# Patient Record
Sex: Female | Born: 1985 | Race: White | Hispanic: No | Smoking: Current every day smoker
Health system: Southern US, Community
[De-identification: ages and names within clinical notes are randomized; demographics above are authoritative.]

---

## 2021-08-26 ENCOUNTER — Other Ambulatory Visit: Payer: Self-pay

## 2021-08-26 ENCOUNTER — Inpatient Hospital Stay (HOSPITAL_COMMUNITY): Payer: Medicaid Other

## 2021-08-26 ENCOUNTER — Inpatient Hospital Stay (HOSPITAL_COMMUNITY)
Admission: AD | Admit: 2021-08-26 | Discharge: 2021-10-29 | DRG: 853 | Disposition: A | Discharge status: Home / Self Care | Payer: Medicaid Other | Source: Other Acute Inpatient Hospital | Attending: Internal Medicine | Admitting: Internal Medicine

## 2021-08-26 ENCOUNTER — Encounter (HOSPITAL_COMMUNITY): Payer: Self-pay | Admitting: Internal Medicine

## 2021-08-26 DIAGNOSIS — Z79899 Other long term (current) drug therapy: Secondary | ICD-10-CM | POA: Diagnosis not present

## 2021-08-26 DIAGNOSIS — J90 Pleural effusion, not elsewhere classified: Secondary | ICD-10-CM | POA: Diagnosis present

## 2021-08-26 DIAGNOSIS — F419 Anxiety disorder, unspecified: Secondary | ICD-10-CM | POA: Diagnosis present

## 2021-08-26 DIAGNOSIS — G8929 Other chronic pain: Secondary | ICD-10-CM | POA: Diagnosis present

## 2021-08-26 DIAGNOSIS — M84411A Pathological fracture, right shoulder, initial encounter for fracture: Secondary | ICD-10-CM | POA: Diagnosis present

## 2021-08-26 DIAGNOSIS — M4628 Osteomyelitis of vertebra, sacral and sacrococcygeal region: Secondary | ICD-10-CM | POA: Diagnosis present

## 2021-08-26 DIAGNOSIS — M4622 Osteomyelitis of vertebra, cervical region: Secondary | ICD-10-CM | POA: Diagnosis present

## 2021-08-26 DIAGNOSIS — M4624 Osteomyelitis of vertebra, thoracic region: Secondary | ICD-10-CM | POA: Diagnosis present

## 2021-08-26 DIAGNOSIS — M4627 Osteomyelitis of vertebra, lumbosacral region: Secondary | ICD-10-CM | POA: Diagnosis present

## 2021-08-26 DIAGNOSIS — M869 Osteomyelitis, unspecified: Secondary | ICD-10-CM | POA: Diagnosis not present

## 2021-08-26 DIAGNOSIS — K6812 Psoas muscle abscess: Secondary | ICD-10-CM | POA: Diagnosis present

## 2021-08-26 DIAGNOSIS — M86111 Other acute osteomyelitis, right shoulder: Secondary | ICD-10-CM | POA: Diagnosis present

## 2021-08-26 DIAGNOSIS — M4656 Other infective spondylopathies, lumbar region: Secondary | ICD-10-CM | POA: Diagnosis not present

## 2021-08-26 DIAGNOSIS — D649 Anemia, unspecified: Secondary | ICD-10-CM | POA: Diagnosis present

## 2021-08-26 DIAGNOSIS — R7881 Bacteremia: Principal | ICD-10-CM | POA: Diagnosis present

## 2021-08-26 DIAGNOSIS — F17210 Nicotine dependence, cigarettes, uncomplicated: Secondary | ICD-10-CM | POA: Diagnosis present

## 2021-08-26 DIAGNOSIS — K255 Chronic or unspecified gastric ulcer with perforation: Secondary | ICD-10-CM | POA: Diagnosis present

## 2021-08-26 DIAGNOSIS — F1910 Other psychoactive substance abuse, uncomplicated: Secondary | ICD-10-CM | POA: Diagnosis present

## 2021-08-26 DIAGNOSIS — B9562 Methicillin resistant Staphylococcus aureus infection as the cause of diseases classified elsewhere: Secondary | ICD-10-CM

## 2021-08-26 DIAGNOSIS — M4626 Osteomyelitis of vertebra, lumbar region: Secondary | ICD-10-CM | POA: Diagnosis present

## 2021-08-26 DIAGNOSIS — Z8249 Family history of ischemic heart disease and other diseases of the circulatory system: Secondary | ICD-10-CM | POA: Diagnosis not present

## 2021-08-26 DIAGNOSIS — E43 Unspecified severe protein-calorie malnutrition: Secondary | ICD-10-CM | POA: Diagnosis present

## 2021-08-26 DIAGNOSIS — K631 Perforation of intestine (nontraumatic): Secondary | ICD-10-CM | POA: Diagnosis not present

## 2021-08-26 DIAGNOSIS — F418 Other specified anxiety disorders: Secondary | ICD-10-CM | POA: Diagnosis present

## 2021-08-26 DIAGNOSIS — A4102 Sepsis due to Methicillin resistant Staphylococcus aureus: Principal | ICD-10-CM | POA: Diagnosis present

## 2021-08-26 DIAGNOSIS — M4649 Discitis, unspecified, multiple sites in spine: Secondary | ICD-10-CM | POA: Diagnosis present

## 2021-08-26 DIAGNOSIS — M009 Pyogenic arthritis, unspecified: Secondary | ICD-10-CM | POA: Diagnosis present

## 2021-08-26 DIAGNOSIS — Z90710 Acquired absence of both cervix and uterus: Secondary | ICD-10-CM

## 2021-08-26 DIAGNOSIS — F32A Depression, unspecified: Secondary | ICD-10-CM | POA: Diagnosis present

## 2021-08-26 DIAGNOSIS — J869 Pyothorax without fistula: Secondary | ICD-10-CM | POA: Diagnosis not present

## 2021-08-26 DIAGNOSIS — Z9151 Personal history of suicidal behavior: Secondary | ICD-10-CM

## 2021-08-26 DIAGNOSIS — K668 Other specified disorders of peritoneum: Secondary | ICD-10-CM | POA: Diagnosis not present

## 2021-08-26 DIAGNOSIS — T50902A Poisoning by unspecified drugs, medicaments and biological substances, intentional self-harm, initial encounter: Secondary | ICD-10-CM

## 2021-08-26 DIAGNOSIS — M19011 Primary osteoarthritis, right shoulder: Secondary | ICD-10-CM | POA: Diagnosis present

## 2021-08-26 DIAGNOSIS — G061 Intraspinal abscess and granuloma: Secondary | ICD-10-CM | POA: Diagnosis present

## 2021-08-26 DIAGNOSIS — R7401 Elevation of levels of liver transaminase levels: Secondary | ICD-10-CM | POA: Diagnosis not present

## 2021-08-26 DIAGNOSIS — R52 Pain, unspecified: Secondary | ICD-10-CM

## 2021-08-26 DIAGNOSIS — L02214 Cutaneous abscess of groin: Secondary | ICD-10-CM | POA: Diagnosis not present

## 2021-08-26 DIAGNOSIS — Z635 Disruption of family by separation and divorce: Secondary | ICD-10-CM

## 2021-08-26 DIAGNOSIS — M47817 Spondylosis without myelopathy or radiculopathy, lumbosacral region: Secondary | ICD-10-CM | POA: Diagnosis present

## 2021-08-26 DIAGNOSIS — E663 Overweight: Secondary | ICD-10-CM | POA: Diagnosis present

## 2021-08-26 HISTORY — DX: Other psychoactive substance abuse, uncomplicated: F19.10

## 2021-08-26 HISTORY — DX: Other specified anxiety disorders: F41.8

## 2021-08-26 HISTORY — DX: Poisoning by unspecified drugs, medicaments and biological substances, intentional self-harm, initial encounter: T50.902A

## 2021-08-26 LAB — CBC
HCT: 29.1 % — ABNORMAL LOW (ref 36.0–46.0)
Hemoglobin: 9.4 g/dL — ABNORMAL LOW (ref 12.0–15.0)
MCH: 31 pg (ref 26.0–34.0)
MCHC: 32.3 g/dL (ref 30.0–36.0)
MCV: 96 fL (ref 80.0–100.0)
Platelets: 642 10*3/uL — ABNORMAL HIGH (ref 150–400)
RBC: 3.03 MIL/uL — ABNORMAL LOW (ref 3.87–5.11)
RDW: 15.4 % (ref 11.5–15.5)
WBC: 13.8 10*3/uL — ABNORMAL HIGH (ref 4.0–10.5)
nRBC: 0 % (ref 0.0–0.2)

## 2021-08-26 LAB — COMPREHENSIVE METABOLIC PANEL
ALT: 107 U/L — ABNORMAL HIGH (ref 0–44)
AST: 76 U/L — ABNORMAL HIGH (ref 15–41)
Albumin: 1.9 g/dL — ABNORMAL LOW (ref 3.5–5.0)
Alkaline Phosphatase: 104 U/L (ref 38–126)
Anion gap: 5 (ref 5–15)
BUN: 8 mg/dL (ref 6–20)
CO2: 26 mmol/L (ref 22–32)
Calcium: 8 mg/dL — ABNORMAL LOW (ref 8.9–10.3)
Chloride: 103 mmol/L (ref 98–111)
Creatinine, Ser: 0.34 mg/dL — ABNORMAL LOW (ref 0.44–1.00)
GFR, Estimated: >60 mL/min (ref >60)
Glucose, Bld: 95 mg/dL (ref 70–99)
Potassium: 4.1 mmol/L (ref 3.5–5.1)
Sodium: 134 mmol/L — ABNORMAL LOW (ref 135–145)
Total Bilirubin: 0.6 mg/dL (ref 0.3–1.2)
Total Protein: 6.5 g/dL (ref 6.5–8.1)

## 2021-08-26 MED ORDER — HYDROMORPHONE HCL 1 MG/ML IJ SOLN
1.0000 mg | INTRAMUSCULAR | Status: AC | PRN
  Administered 2021-08-26 – 2021-08-27 (×4): 1 mg via INTRAVENOUS
  Filled 2021-08-26 (×4): qty 1

## 2021-08-26 MED ORDER — HYDROXYZINE HCL 25 MG PO TABS
50.0000 mg | ORAL_TABLET | Freq: Four times a day (QID) | ORAL | Status: DC | PRN
  Administered 2021-08-27 – 2021-08-29 (×3): 50 mg via ORAL
  Filled 2021-08-26 (×4): qty 2

## 2021-08-26 MED ORDER — NICOTINE 21 MG/24HR TD PT24
21.0000 mg | MEDICATED_PATCH | Freq: Every day | TRANSDERMAL | Status: DC
  Administered 2021-08-26 – 2021-10-28 (×63): 21 mg via TRANSDERMAL
  Filled 2021-08-26 (×66): qty 1

## 2021-08-26 MED ORDER — TRAMADOL HCL 50 MG PO TABS
50.0000 mg | ORAL_TABLET | Freq: Four times a day (QID) | ORAL | Status: DC | PRN
  Administered 2021-08-26 – 2021-08-27 (×3): 50 mg via ORAL
  Filled 2021-08-26 (×4): qty 1

## 2021-08-26 MED ORDER — SODIUM BICARBONATE 650 MG PO TABS
650.0000 mg | ORAL_TABLET | Freq: Every day | ORAL | Status: DC
Start: 2021-08-27 — End: 2021-08-30
  Administered 2021-08-27 – 2021-08-29 (×2): 650 mg via ORAL
  Filled 2021-08-26 (×2): qty 1

## 2021-08-26 MED ORDER — ENOXAPARIN SODIUM 40 MG/0.4ML IJ SOSY
40.0000 mg | PREFILLED_SYRINGE | INTRAMUSCULAR | Status: DC
  Administered 2021-08-26: 40 mg via SUBCUTANEOUS
  Filled 2021-08-26: qty 0.4

## 2021-08-26 MED ORDER — FLUOXETINE HCL 20 MG PO CAPS
40.0000 mg | ORAL_CAPSULE | Freq: Every day | ORAL | Status: DC
Start: 2021-08-27 — End: 2021-10-29
  Administered 2021-08-27 – 2021-10-29 (×57): 40 mg via ORAL
  Filled 2021-08-26 (×62): qty 2

## 2021-08-26 MED ORDER — SODIUM CHLORIDE 0.9 % IV SOLN
8.0000 mg/kg | Freq: Every day | INTRAVENOUS | Status: DC
  Administered 2021-08-26 – 2021-08-28 (×3): 700 mg via INTRAVENOUS
  Filled 2021-08-26 (×5): qty 14

## 2021-08-26 MED ORDER — ONDANSETRON HCL 4 MG/2ML IJ SOLN
4.0000 mg | Freq: Four times a day (QID) | INTRAMUSCULAR | Status: DC | PRN
  Administered 2021-08-28 – 2021-08-30 (×3): 4 mg via INTRAVENOUS
  Filled 2021-08-26 (×3): qty 2

## 2021-08-26 MED ORDER — LACTATED RINGERS IV SOLN: INTRAVENOUS | Status: DC

## 2021-08-26 MED ORDER — ACETAMINOPHEN 325 MG PO TABS
650.0000 mg | ORAL_TABLET | Freq: Four times a day (QID) | ORAL | Status: DC | PRN
  Administered 2021-08-27 – 2021-08-28 (×2): 650 mg via ORAL
  Filled 2021-08-26 (×2): qty 2

## 2021-08-26 MED ORDER — ACETAMINOPHEN 650 MG RE SUPP: 650.0000 mg | Freq: Four times a day (QID) | RECTAL | Status: DC | PRN

## 2021-08-26 MED ORDER — SODIUM CHLORIDE 0.9 % IV SOLN
2.0000 g | INTRAVENOUS | Status: DC
  Administered 2021-08-26 – 2021-08-27 (×2): 2 g via INTRAVENOUS
  Filled 2021-08-26 (×3): qty 20

## 2021-08-26 MED ORDER — ONDANSETRON HCL 4 MG PO TABS: 4.0000 mg | ORAL_TABLET | Freq: Four times a day (QID) | ORAL | Status: DC | PRN

## 2021-08-26 MED ORDER — LIDOCAINE 5 % EX PTCH
1.0000 | MEDICATED_PATCH | CUTANEOUS | Status: DC
  Administered 2021-08-26 – 2021-10-28 (×44): 1 via TRANSDERMAL
  Filled 2021-08-26 (×59): qty 1

## 2021-08-26 NOTE — Progress Notes (Signed)
Pharmacy Antibiotic Note ? [name] is a 36 y.o. female admitted on 08/26/2021 with bacteremia.  Pharmacy has been consulted for daptomycin dosing. Pt transferred from [facility] and per Dr. Olevia Bowens who has been reading thru all the notes from that hospital stay, patient was being seen by ID there and was on Rocephin and daptomycin for MRSA bacteremia in setting of right clavicle area abscess and Hx of IVDA. ? ?Plan: ?Per Dr. Olevia Bowens, patient received last daptomycin and rocephin yesterday AM at [facility] hospital ?Start Daptomycin 10mg /kg IV q24 ?Check CPK tomorrow 3/24 with AM labs ?Rocephin 2g IV q24 per Md ? ?Height: 5\' 7"  (170.2 cm) ?Weight: 87.2 kg (192 lb 3.9 oz) ?IBW/kg (Calculated) : 61.6 ? ?Temp (24hrs), Avg:98.4 ?F (36.9 ?C), Min:98.4 ?F (36.9 ?C), Max:98.4 ?F (36.9 ?C) ? ?Recent Labs  ?Lab 08/26/21 ?1714  ?WBC 13.8*  ?CREATININE 0.34*  ?  ?Estimated Creatinine Clearance: 110.2 mL/min (A) (by C-G formula based on SCr of 0.34 mg/dL (L)).   ? ?Allergies  ?Allergen Reactions  ? Amoxicillin Swelling  ?  Throat closes  ? ? ? ?Thank you for allowing pharmacy to be a part of this patientKimberlys care. ? ?Kara Mead ?08/26/2021 6:15 PM ? ?

## 2021-08-26 NOTE — Consult Note (Signed)
Reason for Consult: Right shoulder pain ?Referring Physician: Dr. Robb Matar ? ?Kimberly Booker is an 36 y.o. female.  ?HPI: Patient presents on transfer from [facility] for management of MRSA sepsis.  All of her old notes are reviewed.  I talked with the patient as well.  In summary she had a fall 8 months ago where she sustained a displaced clavicle fracture.  She was recommended to follow-up with orthopedics but she never did.  By her accounts this seems to have healed with little to no functional difficulty until 7 months after her injury.  She did report posttraumatic deformity of the clavicle but was functional.  At this time she was also using IV drugs per chart review.  She developed acute pain and swelling in the right clavicle approximately 1 month ago.  This led to her attempt to decompress what appeared to be an infection in the right clavicle region.  She was subsequently admitted to the hospital for sepsis and kidney injury.  She underwent a procedure in the emergency department for decompression of the abscess.  CT scanning demonstrated large 8 cm mass in the region of the midshaft clavicle fracture with bony destruction and new bone formation.  She had a transesophageal echo which was negative for endocarditis.  She was transferred here for further management; however, orthopedics was not consulted about this transfer prior to her arrival.  Currently her white blood count is decreasing from a high of over 40,000.  She does not feel particularly great today and she does report significant back pain and weakness and swelling in the bilateral lower extremities.. ? ?Past Medical History:  ?Diagnosis Date  ? Anxiety with depression 08/26/2021  ? Polysubstance abuse (HCC) 08/26/2021  ? Suicide attempt by drug overdose (HCC) 08/26/2021  ? ? ?History reviewed. No pertinent surgical history. ? ?Family History  ?Problem Relation Age of Onset  ? Hypertension Other   ? ? ?Social History:  reports that she has  been smoking cigarettes. She has never used smokeless tobacco. She reports that she does not currently use alcohol. She reports current drug use. Drugs: Methamphetamines and Heroin. ? ?Allergies:  ?Allergies  ?Allergen Reactions  ? Amoxicillin Swelling  ?  Throat closes  ? ? ?Medications: I have reviewed the patient's current medications. ? ?Results for orders placed or performed during the hospital encounter of 08/26/21 (from the past 48 hour(s))  ?CBC     Status: Abnormal  ? Collection Time: 08/26/21  5:14 PM  ?Result Value Ref Range  ? WBC 13.8 (H) 4.0 - 10.5 K/uL  ? RBC 3.03 (L) 3.87 - 5.11 MIL/uL  ? Hemoglobin 9.4 (L) 12.0 - 15.0 g/dL  ? HCT 29.1 (L) 36.0 - 46.0 %  ? MCV 96.0 80.0 - 100.0 fL  ? MCH 31.0 26.0 - 34.0 pg  ? MCHC 32.3 30.0 - 36.0 g/dL  ? RDW 15.4 11.5 - 15.5 %  ? Platelets 642 (H) 150 - 400 K/uL  ? nRBC 0.0 0.0 - 0.2 %  ?  Comment: Performed at [facility], [address], [place], [place] [address]  ?Comprehensive metabolic panel     Status: Abnormal  ? Collection Time: 08/26/21  5:14 PM  ?Result Value Ref Range  ? Sodium 134 (L) 135 - 145 mmol/L  ? Potassium 4.1 3.5 - 5.1 mmol/L  ? Chloride 103 98 - 111 mmol/L  ? CO2 26 22 - 32 mmol/L  ? Glucose, Bld 95 70 - 99 mg/dL  ?  Comment: Glucose reference range applies only to samples taken after fasting for at least 8 hours.  ? BUN 8 6 - 20 mg/dL  ? Creatinine, Ser 0.34 (L) 0.44 - 1.00 mg/dL  ? Calcium 8.0 (L) 8.9 - 10.3 mg/dL  ? Total Protein 6.5 6.5 - 8.1 g/dL  ? Albumin 1.9 (L) 3.5 - 5.0 g/dL  ? AST 76 (H) 15 - 41 U/L  ? ALT 107 (H) 0 - 44 U/L  ? Alkaline Phosphatase 104 38 - 126 U/L  ? Total Bilirubin 0.6 0.3 - 1.2 mg/dL  ? GFR, Estimated >60 >60 mL/min  ?  Comment: (NOTE) ?Calculated using the CKD-EPI Creatinine Equation (2021) ?  ? Anion gap 5 5 - 15  ?  Comment: Performed at [facility], [address] [place], [place] [address]  ? ? ?DG Shoulder Right ? ?Result Date: 08/26/2021 ?CLINICAL DATA:  History of  prior clavicular fracture and right shoulder abscess, initial encounter EXAM: RIGHT SHOULDER - 2+ VIEW COMPARISON:  None. FINDINGS: Midshaft right clavicular fracture is noted with downward displacement of the distal fracture fragment. Increased density is noted over the shoulder consistent with soft tissue dressing. Some air is noted in the supraclavicular region consistent with the known history of abscess. Humeral head is well seated. Generalized increased density is noted in the visualized lung fields likely related to poor inspiratory effort. IMPRESSION: Right clavicular fracture with soft tissue changes consistent with the given clinical history of abscess. Electronically Signed   By: Alcide Clever M.D.   On: 08/26/2021 20:30   ? ?Review of Systems  ?Constitutional:  Positive for appetite change and fatigue.  ?Musculoskeletal:  Positive for arthralgias, back pain and myalgias.  ?Blood pressure 123/78, pulse 99, temperature 99.1 ?F (37.3 ?C), temperature source Oral, resp. rate 18, height 5\' 7"  (1.702 m), weight 87.2 kg, SpO2 100 %. ?Physical Exam ?Vitals reviewed.  ?HENT:  ?   Head: Normocephalic.  ?   Nose: Nose normal.  ?   Mouth/Throat:  ?   Mouth: Mucous membranes are moist.  ?Eyes:  ?   Pupils: Pupils are equal, round, and reactive to light.  ?Cardiovascular:  ?   Rate and Rhythm: Normal rate.  ?   Pulses: Normal pulses.  ?Pulmonary:  ?   Effort: Pulmonary effort is normal.  ?Abdominal:  ?   General: Abdomen is flat.  ?Skin: ?   General: Skin is warm.  ?   Capillary Refill: Capillary refill takes less than 2 seconds.  ?Neurological:  ?   Mental Status: She is alert and oriented to person, place, and time.  ?Psychiatric:     ?   Mood and Affect: Mood normal.  ?Examination of the lower extremities demonstrates mild edema but with intact ankle dorsiflexion and plantarflexion.  Patient has tenderness and to 8 cm punctate wounds around the mid clavicular region.  No erythema or active drainage present.  Area is  tender to touch.  Motor or sensory function to both hands intact with palpable radial pulse. ? ?Assessment/Plan: ?Impression is right clavicle fracture nonunion/osteomyelitis in the setting of a patient who is protein deficient with albumin of less than 2 and has history of polysubstance abuse.  Transesophageal echo negative for heart vegetations.  She has methicillin-resistant staph sepsis.  Had acute kidney injury but that appears to be resolving.  Today patient feels weaker than before.  She is on antibiotics for her sepsis.   although there is some suggestion from the prior  facility that this could represent osteosarcoma her history is not really consistent with that diagnosis.  She is currently having significant low back pain as well.  She needs MRI with IV contrast of her right shoulder and thoracolumbar spine to evaluate for other foci of osteomyelitis.  Her chance of healing any procedure is low based on her protein deficiency.  If this is a pathologic fracture from either osteomyelitis with atypical new bone formation versus some other type of bone producing tumor which has become infected it would likely best be managed at a tertiary facility.  We will see what the scans show. ?Burnard BuntingG Scott Arthi Mcdonald ?08/26/2021, 10:01 PM  ? ? ? ? ?

## 2021-08-26 NOTE — H&P (Signed)
?History and Physical  ? ? ?Patient: Kimberly Booker DOB: 1985-10-13 ?DOA: 08/26/2021 ?DOS: the patient was seen and examined on 08/26/2021 ?PCP: No primary care provider on file.  ?Patient coming from: Home ? ?Chief Complaint: Transferred for bacteremia and pathological right clavicle fracture. ? ?HPI: Kimberly Booker is a 36 y.o. female with medical history significant of anxiety, depression, suicide attempt by drug overdose, polysubstance abuse who suffered a right clavicle fracture about 8 months ago, and except for occasional pain in the area was not having any major issues until about a week before being admitted at [place], [place] (08/15/2021) due to right shoulder abscess, which was treated with I&D in the emergency department followed by IV antibiotic therapy as an inpatient.  The patient was diagnosed with MRSA bacteremia.  She was ruled out for infective endocarditis.  However, she has continued to have positive blood cultures despite treatment with ceftriaxone and vancomycin.  Daptomycin was added instead of vancomycin 6 days ago.  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  She received a 2 PRBC transfusion.  She continues to have pain in the area and has occasional frontal headache.  She has also had almost daily palpitations and chest pressure.  She was seen by cardiology at the transferring facility.  No dyspnea, productive cough, wheezing or hemoptysis.  No abdominal pain, diarrhea, constipation, melena or hematochezia.  No flank pain, dysuria, frequency or hematuria. ?  ?Review of Systems: As mentioned in the history of present illness. All other systems reviewed and are negative. ?Past Medical History:  ?Diagnosis Date  ? Anxiety with depression 08/26/2021  ? Polysubstance abuse (HCC) 08/26/2021  ? Suicide attempt by drug overdose (HCC) 08/26/2021  ? ?Social History:  reports that she has been smoking cigarettes. She has never used  smokeless tobacco. She reports that she does not currently use alcohol. She reports current drug use. Drugs: Methamphetamines and Heroin. ? ?Allergies  ?Allergen Reactions  ? Amoxicillin Swelling  ?  Throat closes  ? ? ?Family History  ?Problem Relation Age of Onset  ? Hypertension Other   ? ? ?Prior to Admission medications   ?Medication Sig Start Date End Date Taking? Authorizing Provider  ?ibuprofen (ADVIL) 200 MG tablet Take 800 mg by mouth every 6 (six) hours as needed for moderate pain.   Yes [provider]  ?FLUoxetine (PROZAC) 40 MG capsule Take 40 mg by mouth daily.    [provider]  ? ? ?Physical Exam: ?Vitals:  ? 08/26/21 1521 08/26/21 1733  ?BP: 126/78   ?Pulse: 93   ?Resp: 20   ?Temp: 98.4 ?F (36.9 ?C)   ?TempSrc: Oral   ?SpO2: 100%   ?Weight:  87.2 kg  ?Height:  5\' 7"  (1.702 m)  ? ?Physical Exam ?Vitals and nursing note reviewed.  ?Constitutional:   ?   General: She is awake.  ?   Appearance: Normal appearance. She is obese. She is ill-appearing.  ?HENT:  ?   Head: Normocephalic.  ?   Mouth/Throat:  ?   Mouth: Mucous membranes are moist.  ?Eyes:  ?   Pupils: Pupils are equal, round, and reactive to light.  ?Cardiovascular:  ?   Rate and Rhythm: Normal rate and regular rhythm.  ?   Heart sounds: S1 normal and S2 normal.  ?Pulmonary:  ?   Effort: Pulmonary effort is normal.  ?   Breath sounds: Normal breath sounds.  ?Abdominal:  ?  General: There is no distension.  ?   Palpations: Abdomen is soft.  ?   Tenderness: There is no abdominal tenderness. There is no rebound.  ?Musculoskeletal:  ?   Right shoulder: Tenderness present. Decreased range of motion.  ?   Cervical back: Neck supple.  ?   Right lower leg: 2+ Pitting Edema present.  ?   Left lower leg: 2+ Pitting Edema present.  ?Skin: ?   General: Skin is dry.  ?Neurological:  ?   General: No focal deficit present.  ?   Mental Status: She is alert and oriented to person, place, and time.  ?Psychiatric:     ?   Attention and  Perception: Attention normal.     ?   Mood and Affect: Mood is anxious.     ?   Speech: Speech normal.     ?   Behavior: Behavior is cooperative.  ? ? ?Data Reviewed: ? ?There are no new results to review at this time. ? ?Assessment and Plan: ?Principal Problem: ?  MRSA bacteremia ?Due to right shoulder abscess. ?Continue daptomycin per pharmacy. ?Continue ceftriaxone 2 g IVPB daily. ?Blood cultures were still growing MRSA as of 08/24/2020. ? ?Active Problems: ?  Pathologic fracture of right clavicle ?Analgesics as needed. ?No imaging was sent. ?Discussed with IR. ?IR recommended teaching facility. ?Discussed with orthopedic surgery. ?They will evaluate, but suspect she may need orthopedic oncology. ? ?  Anxiety with depression ?Resume fluoxetine 40 mg p.o. daily. ?Hydroxyzine 50 mg every 6 hours as needed. ? ?  Polysubstance abuse (HCC) ?Consult TOC. ? ?  Protein-calorie malnutrition, severe (HCC) ?Protein supplementation. ?Continue bacteremia/abscess treatment. ?Consider nutritional services evaluation. ? ?  Transaminitis ?Has been checked for hepatitis. ?Follow-up transaminases. ? ?  Normocytic anemia ?Monitor hematocrit and hemoglobin. ?Transfuse as needed. ? ? ? ? Advance Care Planning:   Code Status: Full Code  ? ?Consults: Orthopedic surgery (Dr. August Saucer). ? ?Family Communication:  ? ?Severity of Illness: ?The appropriate patient status for this patient is INPATIENT. Inpatient status is judged to be reasonable and necessary in order to provide the required intensity of service to ensure the patient's safety. The patient's presenting symptoms, physical exam findings, and initial radiographic and laboratory data in the context of their chronic comorbidities is felt to place them at high risk for further clinical deterioration. Furthermore, it is not anticipated that the patient will be medically stable for discharge from the hospital within 2 midnights of admission.  ? ?* I certify that at the point of admission it  is my clinical judgment that the patient will require inpatient hospital care spanning beyond 2 midnights from the point of admission due to high intensity of service, high risk for further deterioration and high frequency of surveillance required.* ? ?Author: ?Bobette Mo, MD ?08/26/2021 6:33 PM ? ?For on call review [email or web address].  ? ?This document was prepared using [employer] and may contain some unintended transcription errors ?

## 2021-08-26 NOTE — Progress Notes (Signed)
Pharmacy Antibiotic Note ? ?Kimberly Booker is a 36 y.o. female admitted on 08/26/2021 with bacteremia.  Pharmacy has been consulted for daptomycin dosing. Pt transferred from [facility] and per Dr. Olevia Bowens who has been reading thru all the notes from that hospital stay, patient was being seen by ID there and was on Rocephin and daptomycin for MRSA bacteremia in setting of right clavicle area abscess and Hx of IVDA. ? ?Plan: ?Per Dr. Olevia Bowens, patient received last daptomycin and rocephin yesterday AM at [facility] hospital ?Start Daptomycin 8mg /kg IV q24 ?Check CPK tomorrow 3/24 with AM labs ?Rocephin 2g IV q24 per Md ? ?Height: 5\' 7"  (170.2 cm) ?Weight: 87.2 kg (192 lb 3.9 oz) ?IBW/kg (Calculated) : 61.6 ? ?Temp (24hrs), Avg:98.4 ?F (36.9 ?C), Min:98.4 ?F (36.9 ?C), Max:98.4 ?F (36.9 ?C) ? ?Recent Labs  ?Lab 08/26/21 ?1714  ?WBC 13.8*  ?CREATININE 0.34*  ? ?  ?Estimated Creatinine Clearance: 110.2 mL/min (A) (by C-G formula based on SCr of 0.34 mg/dL (L)).   ? ?Allergies  ?Allergen Reactions  ? Amoxicillin Swelling  ?  Throat closes  ? ? ? ?Thank you for allowing pharmacy to be a part of this patient?s care. ? ?Kara Mead ?08/26/2021 6:17 PM ? ?

## 2021-08-26 NOTE — Progress Notes (Signed)
Removed left upper arm midline, biopatch removed with small amount of yellow drainage noted. Complained of soreness at the site. Midline was placed at another facility and patient states it was placed on 3/20. Dr. Robb Matar at bedside and made aware. [facility] PIV started left lateral forearm without difficulty and CBC and CMP were drawn. Labs sent.  ?

## 2021-08-26 NOTE — Plan of Care (Signed)

## 2021-08-27 ENCOUNTER — Inpatient Hospital Stay (HOSPITAL_COMMUNITY): Payer: Medicaid Other

## 2021-08-27 DIAGNOSIS — G061 Intraspinal abscess and granuloma: Secondary | ICD-10-CM

## 2021-08-27 DIAGNOSIS — J90 Pleural effusion, not elsewhere classified: Secondary | ICD-10-CM

## 2021-08-27 DIAGNOSIS — R7881 Bacteremia: Secondary | ICD-10-CM

## 2021-08-27 DIAGNOSIS — B9562 Methicillin resistant Staphylococcus aureus infection as the cause of diseases classified elsewhere: Secondary | ICD-10-CM | POA: Diagnosis not present

## 2021-08-27 DIAGNOSIS — K6812 Psoas muscle abscess: Secondary | ICD-10-CM

## 2021-08-27 LAB — TYPE AND SCREEN
ABO/RH(D): O POS
Antibody Screen: NEGATIVE

## 2021-08-27 LAB — CBC
HCT: 26.4 % — ABNORMAL LOW (ref 36.0–46.0)
Hemoglobin: 8.5 g/dL — ABNORMAL LOW (ref 12.0–15.0)
MCH: 30.8 pg (ref 26.0–34.0)
MCHC: 32.2 g/dL (ref 30.0–36.0)
MCV: 95.7 fL (ref 80.0–100.0)
Platelets: 706 10*3/uL — ABNORMAL HIGH (ref 150–400)
RBC: 2.76 MIL/uL — ABNORMAL LOW (ref 3.87–5.11)
RDW: 14.9 % (ref 11.5–15.5)
WBC: 13.5 10*3/uL — ABNORMAL HIGH (ref 4.0–10.5)
nRBC: 0 % (ref 0.0–0.2)

## 2021-08-27 LAB — SEDIMENTATION RATE: Sed Rate: 72 mm/hr — ABNORMAL HIGH (ref 0–22)

## 2021-08-27 LAB — HIV ANTIBODY (ROUTINE TESTING W REFLEX): HIV Screen 4th Generation wRfx: NONREACTIVE

## 2021-08-27 LAB — C-REACTIVE PROTEIN: CRP: 11.9 mg/dL — ABNORMAL HIGH (ref <1.0)

## 2021-08-27 LAB — BASIC METABOLIC PANEL
Anion gap: 5 (ref 5–15)
BUN: 6 mg/dL (ref 6–20)
CO2: 25 mmol/L (ref 22–32)
Calcium: 7.4 mg/dL — ABNORMAL LOW (ref 8.9–10.3)
Chloride: 100 mmol/L (ref 98–111)
Creatinine, Ser: 0.32 mg/dL — ABNORMAL LOW (ref 0.44–1.00)
GFR, Estimated: >60 mL/min (ref >60)
Glucose, Bld: 92 mg/dL (ref 70–99)
Potassium: 3.7 mmol/L (ref 3.5–5.1)
Sodium: 130 mmol/L — ABNORMAL LOW (ref 135–145)

## 2021-08-27 LAB — CK: Total CK: 14 U/L — ABNORMAL LOW (ref 38–234)

## 2021-08-27 MED ORDER — BACLOFEN 10 MG PO TABS
10.0000 mg | ORAL_TABLET | Freq: Three times a day (TID) | ORAL | Status: DC | PRN
  Administered 2021-08-28: 10 mg via ORAL
  Filled 2021-08-27: qty 1

## 2021-08-27 MED ORDER — GADOBUTROL 1 MMOL/ML IV SOLN
9.0000 mL | Freq: Once | INTRAVENOUS | Status: AC | PRN
  Administered 2021-08-28: 9 mL via INTRAVENOUS

## 2021-08-27 MED ORDER — ENSURE ENLIVE PO LIQD
237.0000 mL | Freq: Three times a day (TID) | ORAL | Status: DC
  Administered 2021-08-27 (×2): 237 mL via ORAL

## 2021-08-27 MED ORDER — LORAZEPAM 2 MG/ML IJ SOLN
1.0000 mg | Freq: Once | INTRAMUSCULAR | Status: AC
  Administered 2021-08-27: 1 mg via INTRAVENOUS
  Filled 2021-08-27: qty 1

## 2021-08-27 MED ORDER — HYDROMORPHONE HCL 1 MG/ML IJ SOLN
0.5000 mg | INTRAMUSCULAR | Status: DC | PRN
  Administered 2021-08-27 – 2021-08-28 (×5): 0.5 mg via INTRAVENOUS
  Filled 2021-08-27 (×6): qty 0.5

## 2021-08-27 NOTE — Assessment & Plan Note (Deleted)
MRI of the shoulder showed osteomyelitis, phlegmon, possible abscess.  Orthopedics following and planning for shoulder washout ?

## 2021-08-27 NOTE — Assessment & Plan Note (Addendum)
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 Bilateral facet arthritis at L5-S1. Dorsal epidural abscess at L4 and L5,highly compressive on the thecal sac. ?S/P  lumbar laminectomy, debridement of abscess, L4-L5 on 3/25. ?No further interventions planned by neurosurgery.   ?On 4/2 patient was complaining of severe pain in the lower back radiating down to her legs.  MRI lumbar spine was ordered. ?It redemonstrated the osteomyelitis at T11-12 and L5-S1.  Noted to be status post L4-L5 laminectomy and debridement with significant decrease in the amount of the abscess.  A small collection was noted posterior to the S1 causing moderate thecal sac narrowing.  Other findings of iliopsoas abscess and sacroiliac septic arthritis were noted as seen previously on MRI pelvis. ?These findings were discussed with neurosurgery.  Patient does not have any focal neurological deficits.  No intervention was recommended. ?Symptoms appear to have improved.  Reason for her symptoms are not entirely clear.  Continue to monitor.  No focal neurological deficits noted.  Patient reassured. ?

## 2021-08-27 NOTE — Consult Note (Signed)
?Chief Complaint  ? ?Back and leg pain ? ?History of Present Illness  ?Kimberly Booker is a 36 y.o. female seen on 28 N. at [place] after transfer from [facility].  She was actually initially transferred from [place] with MSSA bacteremia and septic shoulder arthritis.  Briefly, she suffered a fall about 8 months ago at which time she broke her right clavicle.  She presented to the outside hospital with intermittent fevers and generalized malaise.  She was ultimately diagnosed with MSSA bacteremia.  Upon her transfer to [facility], she was complaining of back and bilateral leg pain.  MRI of the entire spine was done as well as MRI of the right shoulder.  This did reveal both cervical and lumbar epidural abscess and neurosurgical consult was requested. ? ?Upon questioning today, the patient's primary complaint is low back pain with radiation into both legs.  She says pain is relatively constant, but does appear to be worsened when out of bed.  Pain radiates through both legs down into the feet.  She notes subjective weakness left greater than right.  She does not report changes in bladder function denying symptoms of incontinence or retention.  Interestingly, she does not report worsening of relatively chronic neck pain.  No paresthesias in the upper extremities.  With the exception of her right shoulder, she does not report any arm weakness or grip weakness. ? ? ?Past Medical History  ? ?Past Medical History:  ?Diagnosis Date  ? Anxiety with depression 08/26/2021  ? Polysubstance abuse (HCC) 08/26/2021  ? Suicide attempt by drug overdose (HCC) 08/26/2021  ? ? ?Past Surgical History  ?History reviewed. No pertinent surgical history. ? ?Social History  ? ?Social History  ? ?Tobacco Use  ? Smoking status: Every Day  ?  Types: Cigarettes  ? Smokeless tobacco: Never  ?Substance Use Topics  ? Alcohol use: Not Currently  ? Drug use: Yes  ?  Types: Methamphetamines, Heroin  ? ? ?Medications  ? ?Prior to Admission  medications   ?Medication Sig Start Date End Date Taking? Authorizing Provider  ?ibuprofen (ADVIL) 200 MG tablet Take 800 mg by mouth every 6 (six) hours as needed for moderate pain.   Yes [provider]  ?FLUoxetine (PROZAC) 40 MG capsule Take 40 mg by mouth daily.    [provider]  ? ? ?Allergies  ? ?Allergies  ?Allergen Reactions  ? Amoxicillin Swelling  ?  Throat closes  ? ? ?Review of Systems  ?ROS ? ?Neurologic Exam  ?Awake, alert, oriented ?Memory and concentration grossly intact ?Speech fluent, appropriate ?CN grossly intact ?Motor exam: ?Upper Extremities Deltoid Bicep Tricep Grip  ?Right 5/5 5/5 5/5 5/5  ?Left 5/5 5/5 5/5 5/5  ? ?Lower Extremities IP Quad PF DF EHL  ?Right 5/5 5/5 5/5 5/5 5/5  ?Left 5/5 5/5 5/5 5/5 5/5  ? ?Sensation grossly intact to LT ?Hoffman's negative ? ?Imaging  ?MRI of the cervical spine was personally reviewed.  This demonstrates evidence of discitis at C2-3 as well as C5-6 C6-7.  At both areas there is evidence of ventral epidural abscess with associated canal stenosis.  I do not see any T2 signal change within the spinal cord at these levels. ? ?MRI of the thoracic spine also reviewed and does not reveal any identifiable spinal epidural abscess. ? ?MRI of the lumbar spine also personally reviewed.  This demonstrates a relatively large dorsal epidural abscess extending from the top of the L4 level down to about  mid L5.  There is associated significant stenosis at these levels. ? ?Impression  ?- 36 y.o. female with history of polysubstance abuse, MSSA bacteremia, and multiple sites of osteomyelitis including the cervical and lumbar spine as well as her right shoulder.  While there is some epidural abscess ventrally in the cervical spine at C2-3 and C5-C7 with associated stenosis, patient does not appear to be significantly symptomatic from these areas.  She does not have any signs of myelopathy.  There is no signal change on the MRI.  I therefore do not feel  that these areas require surgical debridement. ? ?Patient does also harbor a large dorsal lumbar epidural abscess at L4-5.  This area does appear to be symptomatic with significant associated pain, lower extremity paresthesias, and possible mild left leg weakness although this is certainly also pain limited.  I therefore think that debridement of the lumbar spine is reasonable. ? ?Plan  ?-We will plan on proceeding with lumbar laminectomy L4 and L5 for debridement of spinal epidural abscess ?-N.p.o. after midnight tonight ?-Continue IV antibiotics ? ?I have reviewed the imaging findings at length with the patient.  We have discussed treatment options for both the cervical and lumbar infections.  I told her that given the lack of significant symptoms related to the cervical disease as well as its multifocal nature and the requirement for relatively large operation in order to treat this, I think this can be managed nonsurgically.  I did also offer her surgical decompression of the lumbar abscess.  We discussed the details of the procedure as well as the expected postoperative course and recovery.  We reviewed the risks of the procedure including risk of nerve root injury leading to leg weakness or bladder dysfunction, bleeding, fluid leak, and risk of wound complications.  We also discussed general risks of anesthesia including heart attack, stroke, and blood clots.  Goals of the operation were discussed, and I was very clear with her and saying that goal of the operation is to provide some source control and relieve symptomatic pressure on her nerve roots.  Improvement in her back pain is not a primary goal of this operation.  Patient appeared to understand our discussion and does wish to proceed.  All her questions today were answered. ? ?Lisbeth Renshaw, MD ?[facility]  ? ?

## 2021-08-27 NOTE — Assessment & Plan Note (Addendum)
Discitis/osteomyelitis at C2-3, C5-6, and C6-7. Ventral epidural abscess at C2-3, C5, and C6 with cord impingement especially at the lower 2 levels. Facet arthritis on the left at C7-T1 and right at C3-4. ?-Also showed discitis/osteomyelitis of T9- T10, T11-T12 ?-Neurosurger seen by neurosurgery previously.  No focal neurological deficits noted  ?

## 2021-08-27 NOTE — Hospital Course (Addendum)
36 year old with past medical history significant for anxiety, depression, suicidal attempt drug overdose, polysubstance abuse, right clavicle fracture, was admitted at [facility] on 08/15/2021 due to right shoulder abscess.She was treated with I&D in the emergency department followed by IV antibiotics therapy as an inpatient.  She was diagnosed with MRSA bacteremia, endocarditis was ruled out with a TEE performed 3/16, but she continued to have positive blood cultures despite treatment with ceftriaxone and vancomycin.  Antibiotic therapy was changed to daptomycin.  She was then transferred to [facility]. ?Extensive imagings have been done here which showed right clavicle osteomyelitis/phlegmon/abscess, widespread cervical, thoracic, lumbar discitis/osteomyelitis/epidural abscess/cord impingement.  MRI also showed bilateral iliopsoas abscess, right complex pleural effusion.  ID, orthopedics, neurosurgery following. ?Underwent lumbar laminectomy/debridement of abscess, L4-L5 on 3/25. Hospital course also remarkable for finding of right-sided empyema, gastric perforation. Status post emergent laparotomy and perforation of repair by general surgery.  PCCM was also following for chest tube on the right side, which was removed on 3/29. ?Underwent aspiration of the psoas abscesses on 3/30.  ?Underwent debridement of the right clavicle and application of wound VAC to the right shoulder area on 3/31. ?Developed severe lower back pain on 4/2.  MRI was done which showed improvement in the abscess.  Symptoms have improved. ?

## 2021-08-27 NOTE — Assessment & Plan Note (Addendum)
Patient has disseminated MRSA bacteremia.  ?She had TEE outside facility on 3/16, negative for endocarditis as per the report  ?TTE did not show any vegetation, might need TEE here but was delayed due emergent surgery for gastric perforation and other surgical procedures as outlined.  Will defer to ID. ?Patient is on vancomycin.  WBC has been fluctuating.  She remains afebrile.  Last blood cultures from 3/24 negative.  ID continues to follow.  ?

## 2021-08-27 NOTE — Progress Notes (Addendum)
?Progress Note ? ? ?Patient: Kimberly Booker YIA:[id number] DOB: [birth year] DOA: 08/26/2021     1 ?DOS: the patient was seen and examined on 08/27/2021 ?  ?Brief hospital course: ?36 year old with past medical history significant for anxiety, depression, suicidal attempt drug overdose, polysubstance abuse who self for right clavicle fracture 8 months ago, she reported having occasional pain but a week ago she was admitted at [facility] 08/15/2021 due to right shoulder abscess which was treated with I&D in the emergency department followed by IV antibiotics therapy as an inpatient.  She was diagnosed with MRSA bacteremia endocarditis was ruled out with a TEE performed 3/16, she continued to have positive blood cultures despite treatment with ceftriaxone and vancomycin.  Antibiotic therapy was changed to daptomycin.  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 ? ?Patient was evaluated by Dr. Tonna Corner with orthopedic.  He recommended MRI of the shoulder with IV contrast and also MRI of the cervical thoracic and lumbar spine to rule out osteomyelitis.  Per Dr. Nicki Reaper if patient has a pathological fracture from either osteomyelitis with atypical new bone formation versus some other type of 1 producing 2 more which has become infected it would likely be best managed at a tertiary center. ? ?MRI cervical thoracic and lumbar spine with concern for epidural abscess.  Patient will be transferred to [facility] on for neurosurgery evaluation and likely surgery of epidural abscess.  MRI shoulder still pending at this time. ? ?Assessment and Plan: ?* MRSA bacteremia ?-Patient likely has disseminated MRSA bacteremia.  ?-Continue with IV daptomycin. ?-Plan to repeat Blood cultures.  ?-She had TEE outside facility 3/16 negative for endocarditis.  ?-MRI positive for diskitis and epidural abscess.  Neurosurgery consulted.  ?-Will consult ID.  ?-Check ESR, CRP/  ?-CT chest to evaluate for empyema, MRI pelvis to further evaluate iliopsoas abscess.  ? ?Iliopsoas abscess ([place]) ?Bilateral iliopsoas abscess, incompletely covered, recommend pelvis MRI with contrast. ?-Pelvic MRI ordered.  ? ?Pleural effusion ?Plan to proceed with CT chest, depending on results will need pulmonology consult for thoracentesis/vs chest tube.  ?Currently on 2 L oxygen.  ? ?Abscess in epidural space of cervical spine ?MRI cervical spine: Discitis/osteomyelitis at C2-3, C5-6, and C6-7. Ventral epidural abscess at C2-3, C5, and C6 with cord impingement especially at the lower 2 levels. Facet arthritis on the left at C7-T1 and right at C3-4. ?-Neurosurgery consulted Dr Merdis Delay. Patient will be transfer to [facility] for likely Sx.  ?-Continue with IV antibiotics.  ?-ID consulted.  ? 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 Bilateral facet arthritis at L5-S1. Dorsal epidural abscess at L4 and L5,highly compressive on the thecal sac. ?-neurosurgery consulted.  ?-transfer to [facility] for treatment of epidural abscess.  ?-Continue with IV antibiotics.  ? ?Normocytic anemia ?Monitor hb.  ?Received 2 units PRBC outside facility.  ? ?Polysubstance abuse ([place]) ?Counseling provided/  ? ?Anxiety with depression ?Continue with Prozac and Atarax.  ? ?Pathologic fracture of right clavicle ?CT scan outside faciliy showed large 8 cm ,mass in the region of midshaft clavicle fracture with bony destruction.  ?Dr Nicki Reaper recommend MRI shoulder to further evaluate, if patient has a pathological fracture from either osteomyelitis with atypical new bone formation versus some other type of bone producing tumor which has become infected it would likely best be managed at a tertiary  center. ?-MRI shoulder currently pending.  ?-I discussed with neurosurgery, plan to transfer patient  to [facility] and take care of the epidural abscess and subsequently she could be transfer to tertiary center for further evaluation of the a pathological right clavicle fracture if needed. ? ? ? ? ?  ? ?Subjective: she is complaining of shoulder back pain. She is able to moves upper extremities against gravity, she is not abel to move legs for almost a month.  ?She report episodes of bowel incontinence, specially when she have diarrhea from antibiotics.  ? ?Physical Exam: ?Vitals:  ? 08/26/21 1733 08/26/21 2034 08/27/21 0052 08/27/21 0610  ?BP:  123/78 130/78 128/70  ?Pulse:  99 (!) 103 94  ?Resp:  _0 ?Temp:  99.1 ?F (37.3 ?C) 100.1 ?F (37.8 ?C) 99.7 ?F (37.6 ?C)  ?TempSrc:  Oral Oral Oral  ?SpO2:  100% 100% 100%  ?Weight: 87.2 kg     ?Height: _1  (1.702 m)     ? ?Genera; chronic ill appearing.  ?CVS; S 1, S 2 RRR ?Lungs; CTA ?CVS; S 1, S 2 RRR ?Abdomen; soft, nt ?Neuro; able to moves BL upper extremity against gravity, unable to raise BL extremities against gravity.  ? ?Data Reviewed: ? ?Cervical, thoracic, lumbar spine MRI.  ? ?Family Communication: care discussed with patient.  ? ?Disposition: ?Status is: Inpatient ?Remains inpatient appropriate because: needs IV antibiotics and treatment of cervical and lumbar epidural abscess.  ? ? Planned Discharge Destination:  to be determine ? ? ? ?Time spent: 45 minutes ? ?Author: ?Elmarie Shiley, MD ?08/27/2021 1:04 PM ? ?For on call review [email or web address].  ?

## 2021-08-27 NOTE — Assessment & Plan Note (Addendum)
Noted to be on Prozac and Ativan as needed. ?

## 2021-08-27 NOTE — Consult Note (Signed)
?  [facility]  Total days of antibiotics 2/daptomycin and ceftriaxone ?        ?      ?Reason for Consult:mrsa bacteremia    ?Referring Physician: regalado ? ?Principal Problem: ?  MRSA bacteremia ?Active Problems: ?  Pathologic fracture of right clavicle ?  Anxiety with depression ?  Polysubstance abuse (HCC) ?  Protein-calorie malnutrition, severe (HCC) ?  Transaminitis ?  Normocytic anemia ?  Abscess in epidural space of lumbar spine ?  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  Pleural effusion ?  Iliopsoas abscess (HCC) ? ? ? ?HPI: Kimberly Booker is a 36 y.o. female with history anxiety, who sustained right clavicular fracture roughly 8 months ago, she noted increasing redness and pain to right shoulder and fevers roughly 2 wks prior to admission to outside hospital. She had taken razor blade to open abscess and was prescribed clindamycin. Due to feeling poorly despite this, she was admitted to that hospital on 3/12 where she was found to have MRSA bacteremia and right shoulder abscess. TTE did not suggest endocarditis but CT of chest showed possible septic emboli. Shoulder imaging was concerning for phlegmon vs osteosarcoma thus she was transferred to hospital with more services. Notes comment on persistent bacteremia where she was escalated from vancomycin to daptomycin plus ceftriaxone. On admit on 3/24 she had, leukocytosis of 13.5K, plt reactive at 706. Sed rate of 72.cr 0.32. ck 14. Micro data is missing from transfer informatino ? ?Imaging of spine showed: L5-S1 discitis/osteo with bilateral SI septic arthritis. Bilaterall iliopsoas abscess ,and dorsal epidural abscess on L4-L5 that is compressing thecal sac. Right shoulder showing ill deifned fluid and phlegmon appears to drain to surface of skin anteriorly on mri. Blood cx on 3/24 NGTD. ? ?ID asked to weigh in on management ? ?Past Medical History:  ?Diagnosis Date  ? Anxiety with depression 08/26/2021  ? Polysubstance  abuse (HCC) 08/26/2021  ? Suicide attempt by drug overdose (HCC) 08/26/2021  ? ? ?Allergies:  ?Allergies  ?Allergen Reactions  ? Amoxicillin Swelling  ?  Throat closes  ? ? ?MEDICATIONS: ? enoxaparin (LOVENOX) injection  40 mg Subcutaneous Q24H  ? feeding supplement  237 mL Oral TID BM  ? FLUoxetine  40 mg Oral Daily  ? lidocaine  1 patch Transdermal Q24H  ? nicotine  21 mg Transdermal Daily  ? sodium bicarbonate  650 mg Oral Daily  ? ? ?Social History  ? ?Tobacco Use  ? Smoking status: Every Day  ?  Types: Cigarettes  ? Smokeless tobacco: Never  ?Substance Use Topics  ? Alcohol use: Not Currently  ? Drug use: Yes  ?  Types: Methamphetamines, Heroin  ?-last injected heroin last year. Snorts heroin per record ? ?Family History  ?Problem Relation Age of Onset  ? Hypertension Other   ? ? ? ?Review of Systoms :+ for fever, chills, diaphoresis, activity change, appetite change, fatigue and unexpected weight change.  ?HENT: Negative for congestion, sore throat, rhinorrhea, sneezing, trouble swallowing and sinus pressure.  ?Eyes: Negative for photophobia and visual disturbance.  ?Respiratory: Negative for cough, chest tightness, shortness of breath, wheezing and stridor.  ?Cardiovascular: Negative for chest pain, palpitations and leg swelling.  ?Gastrointestinal: Negative for nausea, vomiting, abdominal pain, diarrhea, constipation, blood in stool, abdominal distention and anal bleeding.  ?Genitourinary: Negative for dysuria, hematuria, flank pain and difficulty urinating.  ?Musculoskeletal: +right shoulder pain,limited ROM. Negative for myalgias, back pain, joint swelling, arthralgias and gait  problem.  ?Skin: Negative for color change, pallor, rash and wound.  ?Neurological: Negative for dizziness, tremors, weakness and light-headedness.  ?Hematological: Negative for adenopathy. Does not bruise/bleed easily.  ?Psychiatric/Behavioral: Negative for behavioral problems, confusion, sleep disturbance, dysphoric mood, decreased  concentration and agitation.  ? ? ?OBJECTIVE: ?Temp:  [98.5 ?F (36.9 ?C)-100.1 ?F (37.8 ?C)] 98.5 ?F (36.9 ?C) (03/24 1337) ?Pulse Rate:  [93-103] 93 (03/24 1337) ?Resp:  [16-18] 16 (03/24 1337) ?BP: (123-132)/(70-82) 132/82 (03/24 1337) ?SpO2:  [99 %-100 %] 99 % (03/24 1337) ?Weight:  [87.2 kg] 87.2 kg (03/23 1733) ?Physical Exam  ?Constitutional:  oriented to person, solomnent.Marland Kitchen appears well-developed and well-nourished. No distress.  ?HENT: [place]/AT, PERRLA, no scleral icterus ?Mouth/Throat: Oropharynx is clear and moist. No oropharyngeal exudate.  ?Cardiovascular: Normal rate, regular rhythm and normal heart sounds. 2/6 SEM ?Pulmonary/Chest: Effort normal and breath sounds normal. No respiratory distress.  has no wheezes.  ?Neck = supple, no nuchal rigidity ?Abdominal: Soft. Bowel sounds are normal.  exhibits no distension. There is no tenderness.  ?Lymphadenopathy: no cervical adenopathy. No axillary adenopathy ?Neurological: alert and oriented to person, place, and time.  ?Skin: Skin is warm and dry. Scattered tattoos. Scattered eschar/skin lesions ?Psychiatric: a normal mood and affect.  behavior is normal.  ? ? ?LABS: ?Results for orders placed or performed during the hospital encounter of 08/26/21 (from the past 48 hour(s))  ?CBC     Status: Abnormal  ? Collection Time: 08/26/21  5:14 PM  ?Result Value Ref Range  ? WBC 13.8 (H) 4.0 - 10.5 K/uL  ? RBC 3.03 (L) 3.87 - 5.11 MIL/uL  ? Hemoglobin 9.4 (L) 12.0 - 15.0 g/dL  ? HCT 29.1 (L) 36.0 - 46.0 %  ? MCV 96.0 80.0 - 100.0 fL  ? MCH 31.0 26.0 - 34.0 pg  ? MCHC 32.3 30.0 - 36.0 g/dL  ? RDW 15.4 11.5 - 15.5 %  ? Platelets 642 (H) 150 - 400 K/uL  ? nRBC 0.0 0.0 - 0.2 %  ?  Comment: Performed at [facility], [address] [place], [place] [address]  ?Comprehensive metabolic panel     Status: Abnormal  ? Collection Time: 08/26/21  5:14 PM  ?Result Value Ref Range  ? Sodium 134 (L) 135 - 145 mmol/L  ? Potassium 4.1 3.5 - 5.1 mmol/L  ? Chloride 103  98 - 111 mmol/L  ? CO2 26 22 - 32 mmol/L  ? Glucose, Bld 95 70 - 99 mg/dL  ?  Comment: Glucose reference range applies only to samples taken after fasting for at least 8 hours.  ? BUN 8 6 - 20 mg/dL  ? Creatinine, Ser 0.34 (L) 0.44 - 1.00 mg/dL  ? Calcium 8.0 (L) 8.9 - 10.3 mg/dL  ? Total Protein 6.5 6.5 - 8.1 g/dL  ? Albumin 1.9 (L) 3.5 - 5.0 g/dL  ? AST 76 (H) 15 - 41 U/L  ? ALT 107 (H) 0 - 44 U/L  ? Alkaline Phosphatase 104 38 - 126 U/L  ? Total Bilirubin 0.6 0.3 - 1.2 mg/dL  ? GFR, Estimated >60 >60 mL/min  ?  Comment: (NOTE) ?Calculated using the CKD-EPI Creatinine Equation (2021) ?  ? Anion gap 5 5 - 15  ?  Comment: Performed at [facility], [address], [place], [place] [address]  ?HIV Antibody (routine testing w rflx)     Status: None  ? Collection Time: 08/27/21  3:21 AM  ?Result Value Ref Range  ? HIV  Screen 4th Generation wRfx Non Reactive Non Reactive  ?  Comment: Performed at [facility], [address] [place], [place] [address]  ?CK     Status: Abnormal  ? Collection Time: 08/27/21  3:21 AM  ?Result Value Ref Range  ? Total CK 14 (L) 38 - 234 U/L  ?  Comment: Performed at [facility], [address] [place], [place] [address]  ?Sedimentation rate     Status: Abnormal  ? Collection Time: 08/27/21 12:37 PM  ?Result Value Ref Range  ? Sed Rate 72 (H) 0 - 22 mm/hr  ?  Comment: Performed at [facility], [address] [place], [place] [address]  ?CBC     Status: Abnormal  ? Collection Time: 08/27/21 12:37 PM  ?Result Value Ref Range  ? WBC 13.5 (H) 4.0 - 10.5 K/uL  ? RBC 2.76 (L) 3.87 - 5.11 MIL/uL  ? Hemoglobin 8.5 (L) 12.0 - 15.0 g/dL  ? HCT 26.4 (L) 36.0 - 46.0 %  ? MCV 95.7 80.0 - 100.0 fL  ? MCH 30.8 26.0 - 34.0 pg  ? MCHC 32.2 30.0 - 36.0 g/dL  ? RDW 14.9 11.5 - 15.5 %  ? Platelets 706 (H) 150 - 400 K/uL  ? nRBC 0.0 0.0 - 0.2 %  ?  Comment: Performed at [facility], [address] [place], [place] [address]   ?Basic metabolic panel     Status: Abnormal  ? Collection Time: 08/27/21 12:37 PM  ?Result Value Ref Range  ? Sodium 130 (L) 135 - 145 mmol/L  ? Potassium 3.7 3.5 - 5.1 mmol/L  ? Chloride 100 98 - 111

## 2021-08-27 NOTE — Plan of Care (Signed)
?  Problem: Clinical Measurements: ?Goal: Ability to maintain clinical measurements within normal limits will improve ?Outcome: Progressing ?  ?Problem: Activity: ?Goal: Risk for activity intolerance will decrease ?Outcome: Progressing ?  ?Problem: Nutrition: ?Goal: Adequate nutrition will be maintained ?Outcome: Progressing ?  ?Problem: Elimination: ?Goal: Will not experience complications related to bowel motility ?Outcome: Progressing ?  ?Problem: Coping: ?Goal: Level of anxiety will decrease ?Outcome: Progressing ?  ?

## 2021-08-27 NOTE — Assessment & Plan Note (Addendum)
Sacroiliac septic arthritis/osteomyelitis ? ?Pelvic MRI showed bilateral infectious sacroiliitis and osteomyelitis of the sacrum and periarticular right iliac bone. Bilateral psoas abscesses, Small bilateral paraspinal muscle abscesses, intramuscular abscess within the left piriformis muscle, small intramuscular abscess within the left gluteus medius muscle. ?Seen by interventional radiology and underwent aspiration on 3/30.  MRSA noted on cultures.   ?

## 2021-08-27 NOTE — Assessment & Plan Note (Addendum)
Hemoglobin low but stable.  ?Received 2 units PRBC at outside facility.  ?

## 2021-08-27 NOTE — Assessment & Plan Note (Addendum)
Chest CT showed a large loculated right pleural effusion concerning for empyema, subcentimeter groundglass nodularity within the bilateral lungs concerning for septic emboli.  ?S/P chest tube placement.  Fluid noted to be exudative.  Cultures without any growth.  No need for DNase/tPA.   ?Chest x-ray showed resolution of the effusion.  Chest tube was discontinued on 3/29.  Respiratory status is stable. ?

## 2021-08-27 NOTE — Assessment & Plan Note (Addendum)
She declined IV drug abuse recently, last drug use was 2 years ago as per patient. ?

## 2021-08-28 ENCOUNTER — Inpatient Hospital Stay (HOSPITAL_COMMUNITY): Payer: Medicaid Other

## 2021-08-28 ENCOUNTER — Other Ambulatory Visit: Payer: Self-pay

## 2021-08-28 ENCOUNTER — Encounter (HOSPITAL_COMMUNITY): Payer: Self-pay | Admitting: Internal Medicine

## 2021-08-28 ENCOUNTER — Inpatient Hospital Stay (HOSPITAL_COMMUNITY): Payer: Medicaid Other | Admitting: Anesthesiology

## 2021-08-28 ENCOUNTER — Encounter (HOSPITAL_COMMUNITY)
Admission: AD | Disposition: A | Discharge status: Home / Self Care | Payer: Self-pay | Source: Other Acute Inpatient Hospital | Attending: Internal Medicine

## 2021-08-28 DIAGNOSIS — G061 Intraspinal abscess and granuloma: Secondary | ICD-10-CM

## 2021-08-28 DIAGNOSIS — R7881 Bacteremia: Secondary | ICD-10-CM | POA: Diagnosis not present

## 2021-08-28 DIAGNOSIS — D649 Anemia, unspecified: Secondary | ICD-10-CM

## 2021-08-28 DIAGNOSIS — B9562 Methicillin resistant Staphylococcus aureus infection as the cause of diseases classified elsewhere: Secondary | ICD-10-CM | POA: Diagnosis not present

## 2021-08-28 HISTORY — PX: LUMBAR LAMINECTOMY/DECOMPRESSION MICRODISCECTOMY: SHX5026

## 2021-08-28 LAB — TYPE AND SCREEN
ABO/RH(D): O POS
Antibody Screen: NEGATIVE

## 2021-08-28 LAB — HCG, SERUM, QUALITATIVE: Preg, Serum: NEGATIVE

## 2021-08-28 SURGERY — LUMBAR LAMINECTOMY/DECOMPRESSION MICRODISCECTOMY
Anesthesia: General | Site: Back

## 2021-08-28 MED ORDER — ORAL CARE MOUTH RINSE: 15.0000 mL | Freq: Once | OROMUCOSAL | Status: AC

## 2021-08-28 MED ORDER — KETOROLAC TROMETHAMINE 15 MG/ML IJ SOLN
15.0000 mg | Freq: Four times a day (QID) | INTRAMUSCULAR | Status: DC | PRN
  Administered 2021-08-29: 15 mg via INTRAVENOUS
  Filled 2021-08-28: qty 1

## 2021-08-28 MED ORDER — JUVEN PO PACK
1.0000 | PACK | Freq: Two times a day (BID) | ORAL | Status: DC
  Administered 2021-08-28 – 2021-08-29 (×2): 1 via ORAL
  Filled 2021-08-28 (×2): qty 1

## 2021-08-28 MED ORDER — FENTANYL CITRATE (PF) 250 MCG/5ML IJ SOLN
INTRAMUSCULAR | Status: DC | PRN
  Administered 2021-08-28: 100 ug via INTRAVENOUS
  Administered 2021-08-28 (×3): 50 ug via INTRAVENOUS

## 2021-08-28 MED ORDER — SUGAMMADEX SODIUM 200 MG/2ML IV SOLN
INTRAVENOUS | Status: DC | PRN
  Administered 2021-08-28: 200 mg via INTRAVENOUS

## 2021-08-28 MED ORDER — LIDOCAINE-EPINEPHRINE 1 %-1:100000 IJ SOLN
INTRAMUSCULAR | Status: AC
  Filled 2021-08-28: qty 1

## 2021-08-28 MED ORDER — ENSURE ENLIVE PO LIQD
237.0000 mL | Freq: Two times a day (BID) | ORAL | Status: DC
  Administered 2021-08-29 (×2): 237 mL via ORAL

## 2021-08-28 MED ORDER — PROPOFOL 10 MG/ML IV BOLUS
INTRAVENOUS | Status: DC | PRN
  Administered 2021-08-28: 120 mg via INTRAVENOUS
  Administered 2021-08-28: 10 mg via INTRAVENOUS

## 2021-08-28 MED ORDER — CEFAZOLIN SODIUM 1 G IJ SOLR
INTRAMUSCULAR | Status: AC
  Filled 2021-08-28: qty 20

## 2021-08-28 MED ORDER — ORAL CARE MOUTH RINSE
15.0000 mL | Freq: Two times a day (BID) | OROMUCOSAL | Status: DC
  Administered 2021-08-28 – 2021-10-28 (×101): 15 mL via OROMUCOSAL

## 2021-08-28 MED ORDER — THROMBIN 5000 UNITS EX SOLR
CUTANEOUS | Status: AC
  Filled 2021-08-28: qty 15000

## 2021-08-28 MED ORDER — IOHEXOL 9 MG/ML PO SOLN
ORAL | Status: AC
  Administered 2021-08-28: 500 mL
  Filled 2021-08-28: qty 1000

## 2021-08-28 MED ORDER — FENTANYL CITRATE (PF) 250 MCG/5ML IJ SOLN
INTRAMUSCULAR | Status: AC
  Filled 2021-08-28: qty 5

## 2021-08-28 MED ORDER — PROPOFOL 10 MG/ML IV BOLUS
INTRAVENOUS | Status: AC
  Filled 2021-08-28: qty 20

## 2021-08-28 MED ORDER — BUPIVACAINE HCL (PF) 0.5 % IJ SOLN
INTRAMUSCULAR | Status: AC
  Filled 2021-08-28: qty 30

## 2021-08-28 MED ORDER — ONDANSETRON HCL 4 MG/2ML IJ SOLN
INTRAMUSCULAR | Status: DC | PRN
Start: 2021-08-28 — End: 2021-08-28
  Administered 2021-08-28: 4 mg via INTRAVENOUS

## 2021-08-28 MED ORDER — ADULT MULTIVITAMIN W/MINERALS CH
1.0000 | ORAL_TABLET | Freq: Every day | ORAL | Status: DC
  Administered 2021-08-28 – 2021-08-29 (×2): 1 via ORAL
  Filled 2021-08-28 (×2): qty 1

## 2021-08-28 MED ORDER — LIDOCAINE 2% (20 MG/ML) 5 ML SYRINGE
INTRAMUSCULAR | Status: AC
  Filled 2021-08-28: qty 5

## 2021-08-28 MED ORDER — DEXAMETHASONE SODIUM PHOSPHATE 10 MG/ML IJ SOLN
INTRAMUSCULAR | Status: DC | PRN
  Administered 2021-08-28: 10 mg via INTRAVENOUS

## 2021-08-28 MED ORDER — LIDOCAINE 2% (20 MG/ML) 5 ML SYRINGE
INTRAMUSCULAR | Status: DC | PRN
  Administered 2021-08-28: 60 mg via INTRAVENOUS

## 2021-08-28 MED ORDER — LIDOCAINE-EPINEPHRINE 1 %-1:100000 IJ SOLN
INTRAMUSCULAR | Status: DC | PRN
  Administered 2021-08-28: 5 mL via INTRADERMAL

## 2021-08-28 MED ORDER — HYDROMORPHONE HCL 1 MG/ML IJ SOLN
0.2500 mg | INTRAMUSCULAR | Status: DC | PRN
  Administered 2021-08-28: 0.5 mg via INTRAVENOUS

## 2021-08-28 MED ORDER — 0.9 % SODIUM CHLORIDE (POUR BTL) OPTIME
TOPICAL | Status: DC | PRN
  Administered 2021-08-28: 1000 mL

## 2021-08-28 MED ORDER — MIDAZOLAM HCL 2 MG/2ML IJ SOLN
INTRAMUSCULAR | Status: AC
Start: 2021-08-28 — End: ?
  Filled 2021-08-28: qty 2

## 2021-08-28 MED ORDER — CHLORHEXIDINE GLUCONATE 0.12 % MT SOLN: 15.0000 mL | Freq: Once | OROMUCOSAL | Status: AC

## 2021-08-28 MED ORDER — ROCURONIUM BROMIDE 10 MG/ML (PF) SYRINGE
PREFILLED_SYRINGE | INTRAVENOUS | Status: AC
  Filled 2021-08-28: qty 10

## 2021-08-28 MED ORDER — CHLORHEXIDINE GLUCONATE 0.12 % MT SOLN
OROMUCOSAL | Status: AC
  Administered 2021-08-28: 15 mL via OROMUCOSAL
  Filled 2021-08-28: qty 15

## 2021-08-28 MED ORDER — THROMBIN 5000 UNITS EX SOLR
OROMUCOSAL | Status: DC | PRN
  Administered 2021-08-28: 5 mL via TOPICAL

## 2021-08-28 MED ORDER — BUPIVACAINE HCL (PF) 0.5 % IJ SOLN
INTRAMUSCULAR | Status: DC | PRN
  Administered 2021-08-28: 5 mL

## 2021-08-28 MED ORDER — KETOROLAC TROMETHAMINE 30 MG/ML IJ SOLN
INTRAMUSCULAR | Status: DC | PRN
  Administered 2021-08-28: 30 mg via INTRAVENOUS

## 2021-08-28 MED ORDER — AMISULPRIDE (ANTIEMETIC) 5 MG/2ML IV SOLN: 10.0000 mg | Freq: Once | INTRAVENOUS | Status: DC | PRN

## 2021-08-28 MED ORDER — MIDAZOLAM HCL 2 MG/2ML IJ SOLN
INTRAMUSCULAR | Status: DC | PRN
Start: 2021-08-28 — End: 2021-08-28
  Administered 2021-08-28: 2 mg via INTRAVENOUS

## 2021-08-28 MED ORDER — LACTATED RINGERS IV SOLN: INTRAVENOUS | Status: DC

## 2021-08-28 MED ORDER — DEXAMETHASONE SODIUM PHOSPHATE 10 MG/ML IJ SOLN
INTRAMUSCULAR | Status: AC
  Filled 2021-08-28: qty 1

## 2021-08-28 MED ORDER — OXYCODONE HCL 5 MG PO TABS
5.0000 mg | ORAL_TABLET | Freq: Four times a day (QID) | ORAL | Status: DC | PRN
  Administered 2021-08-28 – 2021-08-29 (×3): 5 mg via ORAL
  Filled 2021-08-28 (×3): qty 1

## 2021-08-28 MED ORDER — HYDROMORPHONE HCL 1 MG/ML IJ SOLN
1.0000 mg | INTRAMUSCULAR | Status: DC | PRN
  Administered 2021-08-28 – 2021-08-29 (×5): 1 mg via INTRAVENOUS
  Filled 2021-08-28 (×4): qty 1

## 2021-08-28 MED ORDER — IOHEXOL 350 MG/ML SOLN
100.0000 mL | Freq: Once | INTRAVENOUS | Status: AC | PRN
  Administered 2021-08-28: 100 mL via INTRAVENOUS

## 2021-08-28 MED ORDER — HYDROMORPHONE HCL 1 MG/ML IJ SOLN
INTRAMUSCULAR | Status: AC
  Filled 2021-08-28: qty 1

## 2021-08-28 MED ORDER — PROSOURCE PLUS PO LIQD
30.0000 mL | Freq: Two times a day (BID) | ORAL | Status: DC
  Administered 2021-08-28 – 2021-08-29 (×3): 30 mL via ORAL
  Filled 2021-08-28 (×3): qty 30

## 2021-08-28 MED ORDER — ONDANSETRON HCL 4 MG/2ML IJ SOLN
INTRAMUSCULAR | Status: AC
  Filled 2021-08-28: qty 2

## 2021-08-28 MED ORDER — METHOCARBAMOL 1000 MG/10ML IJ SOLN
500.0000 mg | Freq: Four times a day (QID) | INTRAVENOUS | Status: DC | PRN
  Administered 2021-08-29: 500 mg via INTRAVENOUS
  Filled 2021-08-28: qty 500

## 2021-08-28 MED ORDER — ROCURONIUM BROMIDE 10 MG/ML (PF) SYRINGE
PREFILLED_SYRINGE | INTRAVENOUS | Status: DC | PRN
  Administered 2021-08-28 (×2): 50 mg via INTRAVENOUS

## 2021-08-28 SURGICAL SUPPLY — 57 items
BAG COUNTER SPONGE SURGICOUNT (BAG) ×2 IMPLANT
BAND RUBBER #18 3X1/16 STRL (MISCELLANEOUS) ×4 IMPLANT
BENZOIN TINCTURE PRP APPL 2/3 (GAUZE/BANDAGES/DRESSINGS) IMPLANT
BLADE CLIPPER SURG (BLADE) IMPLANT
BLADE SURG 11 STRL SS (BLADE) ×1 IMPLANT
BUR MATCHSTICK NEURO 3.0 LAGG (BURR) ×2 IMPLANT
BUR PRECISION FLUTE 5.0 (BURR) ×1 IMPLANT
CANISTER SUCT 3000ML PPV (MISCELLANEOUS) ×2 IMPLANT
CARTRIDGE OIL MAESTRO DRILL (MISCELLANEOUS) ×1 IMPLANT
DECANTER SPIKE VIAL GLASS SM (MISCELLANEOUS) ×2 IMPLANT
DERMABOND ADVANCED (GAUZE/BANDAGES/DRESSINGS) ×1
DERMABOND ADVANCED .7 DNX12 (GAUZE/BANDAGES/DRESSINGS) ×1 IMPLANT
DIFFUSER DRILL AIR PNEUMATIC (MISCELLANEOUS) ×2 IMPLANT
DRAPE LAPAROTOMY 100X72X124 (DRAPES) ×2 IMPLANT
DRAPE MICROSCOPE LEICA (MISCELLANEOUS) ×2 IMPLANT
DRAPE SURG 17X23 STRL (DRAPES) ×2 IMPLANT
DRSG OPSITE 4X5.5 SM (GAUZE/BANDAGES/DRESSINGS) ×1 IMPLANT
DRSG OPSITE POSTOP 3X4 (GAUZE/BANDAGES/DRESSINGS) ×2 IMPLANT
DURAPREP 26ML APPLICATOR (WOUND CARE) ×2 IMPLANT
ELECT REM PT RETURN 9FT ADLT (ELECTROSURGICAL) ×2
ELECTRODE REM PT RTRN 9FT ADLT (ELECTROSURGICAL) ×1 IMPLANT
EVACUATOR 1/8 PVC DRAIN (DRAIN) ×1 IMPLANT
GAUZE 4X4 16PLY ~~LOC~~+RFID DBL (SPONGE) IMPLANT
GAUZE SPONGE 4X4 12PLY STRL (GAUZE/BANDAGES/DRESSINGS) IMPLANT
GAUZE SPONGE 4X4 12PLY STRL LF (GAUZE/BANDAGES/DRESSINGS) ×1 IMPLANT
GLOVE EXAM NITRILE XL STR (GLOVE) IMPLANT
GLOVE SURG ENC MOIS LTX SZ7.5 (GLOVE) IMPLANT
GLOVE SURG LTX SZ7 (GLOVE) ×2 IMPLANT
GLOVE SURG UNDER POLY LF SZ7.5 (GLOVE) ×4 IMPLANT
GOWN STRL REUS W/ TWL LRG LVL3 (GOWN DISPOSABLE) ×2 IMPLANT
GOWN STRL REUS W/ TWL XL LVL3 (GOWN DISPOSABLE) IMPLANT
GOWN STRL REUS W/TWL 2XL LVL3 (GOWN DISPOSABLE) IMPLANT
GOWN STRL REUS W/TWL LRG LVL3 (GOWN DISPOSABLE) ×2
GOWN STRL REUS W/TWL XL LVL3 (GOWN DISPOSABLE)
HEMOSTAT POWDER KIT SURGIFOAM (HEMOSTASIS) ×2 IMPLANT
KIT BASIN OR (CUSTOM PROCEDURE TRAY) ×2 IMPLANT
KIT TURNOVER KIT B (KITS) ×2 IMPLANT
NDL HYPO 18GX1.5 BLUNT FILL (NEEDLE) IMPLANT
NDL SPNL 18GX3.5 QUINCKE PK (NEEDLE) IMPLANT
NEEDLE HYPO 18GX1.5 BLUNT FILL (NEEDLE) IMPLANT
NEEDLE HYPO 22GX1.5 SAFETY (NEEDLE) ×2 IMPLANT
NEEDLE SPNL 18GX3.5 QUINCKE PK (NEEDLE) ×2 IMPLANT
NS IRRIG 1000ML POUR BTL (IV SOLUTION) ×2 IMPLANT
OIL CARTRIDGE MAESTRO DRILL (MISCELLANEOUS) ×2
PACK LAMINECTOMY NEURO (CUSTOM PROCEDURE TRAY) ×2 IMPLANT
PAD ARMBOARD 7.5X6 YLW CONV (MISCELLANEOUS) ×6 IMPLANT
SPONGE SURGIFOAM ABS GEL SZ50 (HEMOSTASIS) ×2 IMPLANT
SPONGE T-LAP 4X18 ~~LOC~~+RFID (SPONGE) IMPLANT
STRIP CLOSURE SKIN 1/2X4 (GAUZE/BANDAGES/DRESSINGS) IMPLANT
SUT VIC AB 0 CT1 18XCR BRD8 (SUTURE) ×1 IMPLANT
SUT VIC AB 0 CT1 8-18 (SUTURE) ×3
SUT VIC AB 2-0 CT1 18 (SUTURE) IMPLANT
SUT VICRYL 3-0 RB1 18 ABS (SUTURE) ×3 IMPLANT
SYR 3ML LL SCALE MARK (SYRINGE) IMPLANT
TOWEL GREEN STERILE (TOWEL DISPOSABLE) ×2 IMPLANT
TOWEL GREEN STERILE FF (TOWEL DISPOSABLE) ×2 IMPLANT
WATER STERILE IRR 1000ML POUR (IV SOLUTION) ×2 IMPLANT

## 2021-08-28 NOTE — Anesthesia Procedure Notes (Signed)
Procedure Name: Intubation ?Date/Time: 08/28/2021 12:36 PM ?Performed by: Claris Che, CRNA ?Pre-anesthesia Checklist: Patient identified, Emergency Drugs available, Suction available, Patient being monitored and Timeout performed ?Patient Re-evaluated:Patient Re-evaluated prior to induction ?Oxygen Delivery Method: Circle system utilized ?Preoxygenation: Pre-oxygenation with 100% oxygen ?Induction Type: IV induction and Cricoid Pressure applied ?Ventilation: Mask ventilation without difficulty ?Laryngoscope Size: Mac and 4 ?Grade View: Grade II ?Tube type: Oral ?Tube size: 7.0 mm ?Number of attempts: 1 ?Airway Equipment and Method: Stylet ?Placement Confirmation: ETT inserted through vocal cords under direct vision, positive ETCO2 and breath sounds checked- equal and bilateral ?Secured at: 22 cm ?Tube secured with: Tape ?Dental Injury: Teeth and Oropharynx as per pre-operative assessment  ? ? ? ? ?

## 2021-08-28 NOTE — Op Note (Addendum)
?  NEUROSURGERY OPERATIVE NOTE  ? ?PREOP DIAGNOSIS: Lumbar spinal epidural abscess, L4-5 ? ?POSTOP DIAGNOSIS: Same ? ?PROCEDURE: ?1. L4-L5 laminectomy, debridement of epidural abscess ? ?SURGEON: Dr. Lisbeth Renshaw, MD ? ?ASSISTANT: None ? ?ANESTHESIA: General Endotracheal ? ?EBL: 50cc ? ?SPECIMENS: epidural culture swabs ? ?DRAINS: Subfacial hemovac drain ? ?COMPLICATIONS: None immediate ? ?CONDITION: Stable to PCAU ? ?HISTORY: ?Kimberly Booker is a 36 y.o. female initially seen in consultation after being admitted with bacteremia and septic arthritis of the shoulder.  She was complaining of bilateral back and leg pain and MRI of the entire spine revealed multifocal spinal epidural abscess.  While the cervical portion of the abscess did not appear to be symptomatic, she did have a large dorsal epidural abscess at L4-5 with significant stenosis.  After a lengthy discussion of treatment options she elected to proceed with lumbar laminectomy for debridement of abscess.  The risks, benefits fits, and alternatives to surgery were all reviewed in detail with the patient.  After all her questions were answered informed consent was obtained and witnessed. ? ?PROCEDURE IN DETAIL: ?After informed consent was obtained and witnessed, the patient was brought to the operating room. After induction of general anesthesia, the patient was positioned on the operative table in the prone position with all pressure points meticulously padded. The skin of the low back was then prepped and draped in the usual sterile fashion. ? ?After timeout was conducted, spinal needle was introduced and intraoperative portable lateral x-ray was taken to identify the surface projection of the L4 and L5 vertebral bodies.   The skin was infiltrated with local anesthetic. Skin incision was then made sharply and Bovie electrocautery was used to dissect the subcutaneous tissue until the lumbodorsal fascia was identified. The fascia was then incised using  Bovie electrocautery and the lamina at the L4 and L5 levels was identified and dissection was carried out in the subperiosteal plane. Self-retaining retractor was then placed, and intraoperative x-ray was taken to confirm we were at the correct levels. ? ?Using a high-speed drill, laminectomy encompassing the entirety of L4 as well as the top of L5 was completed.  Immediately underneath the lamina I noted thin yellow phlegmon.  Culture both anaerobic and aerobic swabs were taken and sent to the microbiology department.  The laminectomy defect at L4 and the top of L5 was widened with Kerrison punches to identify the lateral aspect of the thecal sac.  The abscess was carefully dissected away from the dorsal surface of the dura.  It was noted to be relatively adherent.  Once the abscess was debrided, there did not appear to be any further compression of the thecal sac. ? ?Hemostasis was then secured using a combination of morcellized Gelfoam and thrombin and bipolar electrocautery. The wound is irrigated with copious amounts of antibiotic saline irrigation.  Hemovac drain was placed over the laminectomy defect and tunneled subcutaneously.  The wound is closed in layers using a combination of interrupted 0 Vicryl and 3-0 Vicryl stitches. The skin was closed using standard skin glue. ? ?At the end of the case all sponge, needle, and instrument counts were correct. The patient was then transferred to the stretcher and taken to the postanesthesia care unit in stable hemodynamic condition. ? ?Lisbeth Renshaw, MD ?[facility]  ? ?

## 2021-08-28 NOTE — Anesthesia Preprocedure Evaluation (Signed)
Anesthesia Evaluation  ?Patient identified by MRN, date of birth, ID band ?Patient awake ? ? ? ?Reviewed: ?Allergy & Precautions, NPO status , Patient's Chart, lab work & pertinent test results ? ?Airway ?Mallampati: II ? ?TM Distance: >3 FB ?Neck ROM: Full ? ? ? Dental ? ?(+) Dental Advisory Given ?  ?Pulmonary ?Current Smoker,  ?  ?breath sounds clear to auscultation ? ? ? ? ? ? Cardiovascular ?negative cardio ROS ? ? ?Rhythm:Regular Rate:Normal ? ? ?  ?Neuro/Psych ? Neuromuscular disease   ? GI/Hepatic ?negative GI ROS, (+)  ?  ? substance abuse ? ,   ?Endo/Other  ?negative endocrine ROS ? Renal/GU ?negative Renal ROS  ? ?  ?Musculoskeletal ? ? Abdominal ?  ?Peds ? Hematology ? ?(+) Blood dyscrasia, anemia ,   ?Anesthesia Other Findings ? ? Reproductive/Obstetrics ? ?  ? ? ? ? ? ? ? ? ? ? ? ? ? ?  ?  ? ? ? ? ? ? ? ? ?Lab Results  ?Component Value Date  ? WBC 13.5 (H) 08/27/2021  ? HGB 8.5 (L) 08/27/2021  ? HCT 26.4 (L) 08/27/2021  ? MCV 95.7 08/27/2021  ? PLT 706 (H) 08/27/2021  ? ?Lab Results  ?Component Value Date  ? CREATININE 0.32 (L) 08/27/2021  ? BUN 6 08/27/2021  ? NA 130 (L) 08/27/2021  ? K 3.7 08/27/2021  ? CL 100 08/27/2021  ? CO2 25 08/27/2021  ? ? ?Anesthesia Physical ?Anesthesia Plan ? ?ASA: 3 ? ?Anesthesia Plan: General  ? ?Post-op Pain Management: Toradol IV (intra-op)* and Tylenol PO (pre-op)*  ? ?Induction: Intravenous ? ?PONV Risk Score and Plan: 2 and Dexamethasone, Ondansetron and Treatment may vary due to age or medical condition ? ?Airway Management Planned: Oral ETT ? ?Additional Equipment: None ? ?Intra-op Plan:  ? ?Post-operative Plan: Extubation in OR ? ?Informed Consent: I have reviewed the patients History and Physical, chart, labs and discussed the procedure including the risks, benefits and alternatives for the proposed anesthesia with the patient or authorized representative who has indicated his/her understanding and acceptance.  ? ? ? ?Dental  advisory given ? ?Plan Discussed with: CRNA ? ?Anesthesia Plan Comments:   ? ? ? ? ? ? ?Anesthesia Quick Evaluation ? ?

## 2021-08-28 NOTE — Assessment & Plan Note (Deleted)
Nutrition is consulted ?

## 2021-08-28 NOTE — Progress Notes (Signed)
?  NEUROSURGERY PROGRESS NOTE  ? ?No issues overnight. Pt without new c/o this am. ? ?EXAM:  ?BP 128/77 (BP Location: Right Arm)   Pulse 89   Temp 98.7 ?F (37.1 ?C) (Oral)   Resp 16   Ht 5\' 7"  (1.702 m)   Wt 86.4 kg   LMP  (LMP Unknown)   SpO2 100%   BMI 29.83 kg/m?  ? ?Awake, alert, oriented  ?Speech fluent, appropriate  ?CN grossly intact  ?5/5 BUE ?5/5 RLE, Mild giveaway weakness LLE ? ?IMPRESSION:  ?36 y.o. female with multifocal SEA and MSSA bacteremia. Large dorsal lumbar abscess appears symptomatic while cervical abscess is smaller and does not appear to be symptomatic and can be treated with abx ? ?PLAN: ?- Will proceed with lumbar laminectomy for debridement of abscess, L4-5 ? ?I have again reviewed the situation with the patient. All her questions were answered and she is ready to proceed with surgery. ? ? ?31, MD ?[facility]  ? ?

## 2021-08-28 NOTE — Transfer of Care (Signed)
Immediate Anesthesia Transfer of Care Note ? ?Patient: Kimberly Booker ? ?Procedure(s) Performed: LUMBAR LAMINECTOMY WITH DEBRIDEMENT OF ABSCESS Lumbar four - five (Back) ? ?Patient Location: PACU ? ?Anesthesia Type:General ? ?Level of Consciousness: awake, alert , patient cooperative and responds to stimulation ? ?Airway & Oxygen Therapy: Patient Spontanous Breathing and Patient connected to nasal cannula oxygen ? ?Post-op Assessment: Report given to RN and Post -op Vital signs reviewed and stable ? ?Post vital signs: Reviewed and stable ? ?Last Vitals:  ?Vitals Value Taken Time  ?BP 111/66 08/28/21 1428  ?Temp    ?Pulse 84 08/28/21 1430  ?Resp 12 08/28/21 1430  ?SpO2 89 % 08/28/21 1430  ?Vitals shown include unvalidated device data. ? ?Last Pain:  ?Vitals:  ? 08/28/21 1059  ?TempSrc: Oral  ?PainSc:   ?   ? ?Patients Stated Pain Goal: 3 (08/28/21 0100) ? ?Complications: No notable events documented. ?

## 2021-08-28 NOTE — Anesthesia Postprocedure Evaluation (Signed)
Anesthesia Post Note ? ?Patient: ROSALEA WITHROW ? ?Procedure(s) Performed: LUMBAR LAMINECTOMY WITH DEBRIDEMENT OF ABSCESS Lumbar four - five (Back) ? ?  ? ?Patient location during evaluation: PACU ?Anesthesia Type: General ?Level of consciousness: awake and alert ?Pain management: pain level controlled ?Vital Signs Assessment: post-procedure vital signs reviewed and stable ?Respiratory status: spontaneous breathing, nonlabored ventilation, respiratory function stable and patient connected to nasal cannula oxygen ?Cardiovascular status: blood pressure returned to baseline and stable ?Postop Assessment: no apparent nausea or vomiting ?Anesthetic complications: no ? ? ?No notable events documented. ? ?Last Vitals:  ?Vitals:  ? 08/28/21 1516 08/28/21 2009  ?BP: 116/81 115/80  ?Pulse: 79 78  ?Resp: 18 20  ?Temp: 37.2 ?C 36.4 ?C  ?SpO2: 100% 100%  ?  ?Last Pain:  ?Vitals:  ? 08/28/21 2024  ?TempSrc:   ?PainSc: 7   ? ? ?  ?  ?  ?  ?  ?  ? ?Marcene Duos E ? ? ? ? ?

## 2021-08-28 NOTE — Progress Notes (Signed)
?PROGRESS NOTE ? ?Kimberly Booker  ZDG:[id number] DOB: [birth year] DOA: 08/26/2021 ?PCP: Pcp, No  ? ?Brief Narrative: ?36 year old with past medical history significant for anxiety, depression, suicidal attempt drug overdose, polysubstance abuse , right clavicle fracture 8 months ago, she reported having occasional pain but a week ago and  was admitted at [facility] on  08/15/2021 due to right shoulder abscess.She was treated with I&D in the emergency department followed by IV antibiotics therapy as an inpatient.  She was diagnosed with MRSA bacteremia ,endocarditis was ruled out with a TEE performed 3/16,but she continued to have positive blood cultures despite treatment with ceftriaxone and vancomycin.  Antibiotic therapy was changed to daptomycin.  She was then transferred to [facility]. ?Extensive imagings have been done here which showed right clavicle osteomyelitis/phlegmon/abscess, widespread cervical,thoracic,lumbar discitis/osteomyelitis/epidural abscess/cord impingement.  MRI also showed bilateral iliopsoas abscess, right complex pleural effusion.  ID, orthopedics, neurosurgery following.  Currently on daptomycin.  Plan for transthoracic echo.  Neurosurgery planning for lumbar laminectomy for debridement of abscess, L4-L5  ? ?Assessment & Plan: ? ?Principal Problem: ?  MRSA bacteremia ?Active Problems: ?  Abscess in epidural space of lumbar spine ?  Pathologic fracture of right clavicle ?  Anxiety with depression ?  Polysubstance abuse (HCC) ?  Protein-calorie malnutrition, severe (HCC) ?  Transaminitis ?  Normocytic anemia ?  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  Pleural effusion ?  Iliopsoas abscess (HCC) ? ? ?Assessment and Plan: ?* MRSA bacteremia ?-Patient likely has disseminated MRSA bacteremia.  ?-Continue with IV daptomycin. ?-Repeat blood cultures sent on 3/24 ?-She had TEE outside facility 3/16 negative for endocarditis.  ?-ID following, plan for TTE, most likely needs TEE also.   ? 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 Bilateral facet arthritis at L5-S1. Dorsal epidural abscess at L4 and L5,highly compressive on the thecal sac. ?Neurosurgery planning for lumbar laminectomy, debridement of abscess, L4-L5 ? ?Iliopsoas abscess (HCC) ?Bilateral iliopsoas abscess, incompletely covered, recommend pelvis MRI with contrast. ?-Pelvic MRI ordered.  ? ?Pleural effusion ?Plan to proceed with CT chest, depending on results will need pulmonology consult for thoracentesis/vs chest tube.  ?Currently on 2 L oxygen.  ? ?Abscess in epidural space of cervical spine ?Discitis/osteomyelitis at C2-3, C5-6, and C6-7. Ventral epidural abscess at C2-3, C5, and C6 with cord impingement especially at the lower 2 levels. Facet arthritis on the left at C7-T1 and right at C3-4. ?-Also showed discitis/osteomyelitis of T9- T10, T11-T12 ?-Neurosurgery following. ? ?Normocytic anemia ?Monitor hb.  Currently stable.  Received 2 units PRBC outside facility.  ? ?Protein-calorie malnutrition, severe (HCC) ?Nutrition is consulted ? ?Polysubstance abuse (HCC) ?She declined IV drug abuse recently, last drug use was 2 years ago as per her ? ?Anxiety with depression ?Continue with Prozac and Atarax.  ? ?Pathologic fracture of right clavicle ?MRI of the shoulder showed osteomyelitis, phlegmon, possible abscess.  Orthopedics following ? ? ? ? ?  ?  ? ?DVT prophylaxis: ?Lovenox ? ?  Code Status: Full Code ? ?Family Communication: None at bedside ? ?Patient status: Inpatient ? ?Patient is from : Home ? ?Anticipated discharge to: Home ? ?Estimated DC date: Suspect prolonged hospitalization ? ? ?Consultants: ID, neurosurgery, orthopedics ? ?Procedures: Plan for laminectomy ? ?Antimicrobials:  ?Anti-infectives (From admission, onward)  ? ? Start     Dose/Rate Route Frequency Ordered Stop  ? 08/26/21 2000  [MAR Hold]  cefTRIAXone (ROCEPHIN) 2 g in sodium  chloride  0.9 % 100 mL IVPB        (MAR Hold since Sat 08/28/2021 at 1037.Hold Reason: Transfer to a Procedural area)  ? 2 g ?200 mL/hr over 30 Minutes Intravenous Every 24 hours 08/26/21 1614    ? 08/26/21 2000  [MAR Hold]  DAPTOmycin (CUBICIN) 700 mg in sodium chloride 0.9 % IVPB        (MAR Hold since Sat 08/28/2021 at 1037.Hold Reason: Transfer to a Procedural area)  ? 8 mg/kg ? 87.2 kg ?128 mL/hr over 30 Minutes Intravenous Daily 08/26/21 1819    ? ?  ? ? ?Subjective: ?Patient seen and examined at the bedside this morning she was complaining of back pain, pain on bilateral thighs.  Hemodynamically stable ? ?Objective: ?Vitals:  ? 08/28/21 0449 08/28/21 0500 08/28/21 0710 08/28/21 0811  ?BP: 107/80   128/77  ?Pulse: 90   89  ?Resp: 16   16  ?Temp: 98.1 ?F (36.7 ?C)   98.7 ?F (37.1 ?C)  ?TempSrc:    Oral  ?SpO2: 100%  100% 100%  ?Weight:  86.4 kg    ?Height:      ? ? ?Intake/Output Summary (Last 24 hours) at 08/28/2021 1047 ?Last data filed at 08/28/2021 0840 ?Gross per 24 hour  ?Intake 0 ml  ?Output 2200 ml  ?Net -2200 ml  ? ?Filed Weights  ? 08/26/21 1733 08/28/21 0500  ?Weight: 87.2 kg 86.4 kg  ? ? ?Examination: ? ?General exam: In moderate distress due to pain, uncomfortable ?HEENT: PERRL ?Respiratory system:  no wheezes or crackles  ?Cardiovascular system: Sinus tachycardia ?Gastrointestinal system: Abdomen is nondistended, soft and nontender. ?Central nervous system: Alert and oriented ?Extremities: No edema, no clubbing ,no cyanosis, wound on the right clavicular region ?Skin: No rashes, no ulcers,no icterus   ? ? ?Data Reviewed: I have personally reviewed following labs and imaging studies ? ?CBC: ?Recent Labs  ?Lab 08/26/21 ?1714 08/27/21 ?1237  ?WBC 13.8* 13.5*  ?HGB 9.4* 8.5*  ?HCT 29.1* 26.4*  ?MCV 96.0 95.7  ?PLT 642* 706*  ? ?Basic Metabolic Panel: ?Recent Labs  ?Lab 08/26/21 ?1714 08/27/21 ?1237  ?NA 134* 130*  ?K 4.1 3.7  ?CL 103 100  ?CO2 26 25  ?GLUCOSE 95 92  ?BUN 8 6  ?CREATININE 0.34* 0.32*   ?CALCIUM 8.0* 7.4*  ? ? ? ?Recent Results (from the past 240 hour(s))  ?Culture, blood (routine x 2)     Status: None (Preliminary result)  ? Collection Time: 08/27/21 12:39 PM  ? Specimen: BLOOD  ?Result Value Ref Range Status  ? Specimen Description   Final  ?  BLOOD BLOOD RIGHT FOREARM ?Performed at [facility], [address], [place], [place] [address] ?  ? Special Requests   Final  ?  BOTTLES DRAWN AEROBIC ONLY Blood Culture results may not be optimal due to an inadequate volume of blood received in culture bottles ?Performed at [facility], [address], [place], [place] [address] ?  ? Culture   Final  ?  NO GROWTH < 24 HOURS ?Performed at [facility], [address], [place], [place] [address] ?  ? Report Status PENDING  Incomplete  ?Culture, blood (routine x 2)     Status: None (Preliminary result)  ? Collection Time: 08/27/21 12:39 PM  ? Specimen: BLOOD  ?Result Value Ref Range Status  ? Specimen Description   Final  ?  BLOOD BLOOD RIGHT HAND ?Performed at [facility], [address] [place], [place] [address] ?  ? Special Requests   Final  ?  BOTTLES DRAWN AEROBIC ONLY Blood Culture adequate volume ?Performed at [facility], [address] [place], [place] [address] ?  ? Culture   Final  ?  NO GROWTH < 24 HOURS ?Performed at [facility], [address] [place], [place] [address] ?  ? Report Status PENDING  Incomplete  ?  ? ?Radiology Studies: ?DG THORACOLUMABAR SPINE ? ?Result Date: 08/27/2021 ?CLINICAL DATA:  Mid to lower back pain with numbness in lower extremities. EXAM: THORACOLUMBAR SPINE 1V COMPARISON:  MRI of same date FINDINGS: AP views image from approximately the top of T5 through the sacrum. Sacroiliac joints are symmetric. Lateral views image from approximately the top of T7 through the upper sacrum. Maintenance of vertebral body height across these levels. Loss of intervertebral disc height at  multiple lower thoracic levels. Intervertebral disc height loss at the lumbosacral junction, presumably secondary to discitis/osteomyelitis when correlated with today's MRI. IMPRESSION: Maintenance of thoracol

## 2021-08-28 NOTE — Progress Notes (Signed)
Initial Nutrition Assessment ?RD working remotely. ? ?DOCUMENTATION CODES:  ? ?Not applicable ? ?INTERVENTION:  ?- continue Ensure Plus High Protein but will decrease from TID to BID, each supplement provides 350 kcal and 20 grams of protein. ?- will order 1 packet Juven BID, each packet provides 95 calories, 2.5 grams of protein (collagen), and 9.8 grams of carbohydrate (3 grams sugar); also contains 7 grams of L-arginine and L-glutamine, 300 mg vitamin C, 15 mg vitamin E, 1.2 mcg vitamin B-12, 9.5 mg zinc, 200 mg calcium, and 1.5 g  Calcium Beta-hydroxy-Beta-methylbutyrate to support wound healing. ?- will order 30 ml Prosource Plus BID, each supplement provides 100 kcal and 15 grams protein.  ?- will order 1 tablet multivitamin with minerals daily. ?- complete NFPE when feasible.  ? ? ?NUTRITION DIAGNOSIS:  ? ?Increased nutrient needs related to acute illness, post-op healing, wound healing as evidenced by estimated needs. ? ?GOAL:  ? ?Patient will meet greater than or equal to 90% of their needs ? ?MONITOR:  ? ?Diet advancement, PO intake, Supplement acceptance, Labs, Weight trends, Skin ? ?REASON FOR ASSESSMENT:  ? ?Consult ?Assessment of nutrition requirement/status ? ?ASSESSMENT:  ? ?36 year old female with medical history of anxiety, depression, suicide attempt by drug OD, polysubstance abuse, R clavicle fx 8 months ago. She presented to the ED ongoing R shoulder pain. She had gone to another hospital 2 weeks ago and found to have R shoulder abscess which was treated with I&D and IV abx. She was dx with MRSA bacteremia and endocarditis was r/o on TEE on 3/16. She was transferred from [facility] to [facility] due to ongoing positive blood cultures despite treatment. Once at [facility], extensive imaging showed R clavicle osteomyelitis/phlegmon/abscess, widespread cervical,thoracic,lumbar discitis/osteomyelitis/epidural abscess/cord impingement. MRI showed bilateral iliopsoas abscess, R complex  pleural effusion. ID, Orthopedics, and Neurosurgery following.  Currently on daptomycin. Plan for transthoracic echo. Neurosurgery planning for lumbar laminectomy for debridement of abscess, L4-L5. ? ?Unable to reach patient by phone at time of attempt. Earlier this afternoon she underwent lumbar laminectomy for debridement of abscess.  ? ?Heart Healthy diet was ordered on 3/23 at 1625 and she was made NPO at midnight last night; not yet re-advanced. No meal intakes documented this admission. ? ?Ensure was ordered TID on 3/23 and she accepted all bottles offered to her.  ? ?She has not been seen by a [facility] RD at any time in the past.  ? ?Weight today is 190 lb and weight on 3/23 was 192 lb. No other weight recordings in the chart. MST filled out 48 hours ago and score of 0. ? ? ?Labs reviewed; Na: 130 mmol/l, creatinine: 0.32 mg/dl, Ca: 7.4 mg/dl. ?Medications reviewed; 650 mg sodium bicarb/day. ?IVF; LR @ 75 ml/hr.  ?  ? ?NUTRITION - FOCUSED PHYSICAL EXAM: ? ?RD working remotely. ? ?Diet Order:   ?Diet Order   ? ?       ?  Diet NPO time specified  Diet effective midnight       ?  ? ?  ?  ? ?  ? ? ?EDUCATION NEEDS:  ? ?Not appropriate for education at this time ? ?Skin:  Skin Assessment: Skin Integrity Issues: ?Skin Integrity Issues:: Incisions ?Incisions: throat (3/23) and back (3/25) ? ?Last BM:  PTA/unknown ? ?Height:  ? ?Ht Readings from Last 1 Encounters:  ?08/28/21 5\' 7"  (1.702 m)  ? ? ?Weight:  ? ?Wt Readings from Last 1 Encounters:  ?08/28/21 86.4 kg  ? ? ? ?  BMI:  Body mass index is 29.83 kg/m?. ? ?Estimated Nutritional Needs:  ?Kcal:  2400-2600 kcal ?Protein:  120-130 grams ?Fluid:  >/= 2.5 L/day ? ? ? ? ? ?Trenton Gammon, MS, RD, LDN ?Registered Dietitian II ?Inpatient Clinical Nutrition ?RD pager # and on-call/weekend pager # available in AMION  ? ?

## 2021-08-28 NOTE — Progress Notes (Signed)
? ?[facility] Infectious Diseases Follow Up Note ? ?Patient Identification: ?Patient Name: Kimberly Booker MRN: [id number] Admit Date: 08/26/2021  3:22 PM ?Age: 36 y.o.Today's Date: 08/28/2021 ? ? ?Reason for Visit: disseminated MRSA infection  ? ?Principal Problem: ?  MRSA bacteremia ?Active Problems: ?  Pathologic fracture of right clavicle ?  Anxiety with depression ?  Polysubstance abuse (HCC) ?  Protein-calorie malnutrition, severe (HCC) ?  Transaminitis ?  Normocytic anemia ?  Abscess in epidural space of lumbar spine ?  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  Pleural effusion ?  Iliopsoas abscess (HCC) ? ? ?Antibiotics:  ?Daptomycin 3/23-c  ?Ceftriaxone 3/23-c ? ?Lines/Hardwares:  ? ?Interval Events: continues to be afebrile. Plan for OR today  ? ? ?Assessment ?Disseminated MRSA infection ( diffuse discitis,/osteomyelitis/epidural abscess of of cervical, thoracic and lumbar spine, bilateral septic arthritis, osteomyelitis of rt clavicle, possible rt lung empyema)  ? ?Per microbiology at [facility]) ?3/12 blood cx 4/4 bottles MRSA ?3/14 blood cx both sets MRSA ?3/22 blood cx no growth in 2 days ( prelim) ? ?3/12 urine cx normal floral ( 60,000 colonies) ? ?3/12 chest wound cx MRSA  ? ?MRSA sensitive to Vancomycin and daptomycin. I have requested to fax sensitivities of MRSA from blood cultures as well as chest wound cultures  ? ?# Polysubstance use - admits to smoking, and recreational drug use only orally but tells she has used injected drugs 8 times total in the past, last use was 2 years ago  ? ?Recommendations ?-Continue Daptomycin  as is . DC ceftriaxone, seems to be a diffuse MRSA infection  ?-Also concerns for septic emboli at OSH. A CT Chest has been ordered. Will follow up for need to add pulmonary coverage ?-Recommend to get CT abdomen/pelvis as well, significant tenderness at the LUQ region to r/o any splenic/renal  infarcts ?-TTE ordered, anticipate TEE would not be done soon until early next week ?-Ortho evaluation of rt shoulder  ?-Planned for spinal decompression by Neurosurgery today. Please send cultures and path as appropriate  ?-Fu blood cultures here as well as at [facility] for clearance/sensi ?-Hepatitis B and C serology ordered  ?- Following  ? ?Rest of the management as per the primary team. ?Thank you for the consult. Please page with pertinent questions or concerns. ? ?______________________________________________________________________ ?Subjective ?patient seen and examined at the bedside.  ?Pain at the rt shoulder and back  ? ?Vitals ?BP 128/77 (BP Location: Right Arm)   Pulse 89   Temp 98.7 ?F (37.1 ?C) (Oral)   Resp 16   Ht 5\' 7"  (1.702 m)   Wt 86.4 kg   LMP  (LMP Unknown)   SpO2 100%   BMI 29.83 kg/m?  ? ?  ?Physical Exam ?Constitutional:  sitting up in the bed and has [place] ?   Comments:  ? ?Cardiovascular:  ?   Rate and Rhythm: Normal rate and regular rhythm.  ?   Heart sounds:  ? ?Pulmonary:  ?   Effort: Pulmonary effort is normal on [place] ?   Comments:  ? ?Abdominal:  ?   Palpations: Abdomen is soft.  ?   Tenderness: LUQ abdominal tenderness ? ?Musculoskeletal:     ?   General: Pan vertebral spine tenderness. Rt shoulder area with a n open wound in the anterior aspect ? ?Skin: ?   Comments: tattoos  ? ?Neurological:  ?   General: awake, alert and oriented, grossly non focal and spontaneously moves all extremities  ? ?  Psychiatric:     ?   Mood and Affect: Mood normal.  ? ?Pertinent Microbiology ?Results for orders placed or performed during the hospital encounter of 08/26/21  ?Culture, blood (routine x 2)     Status: None (Preliminary result)  ? Collection Time: 08/27/21 12:39 PM  ? Specimen: BLOOD  ?Result Value Ref Range Status  ? Specimen Description   Final  ?  BLOOD BLOOD RIGHT FOREARM ?Performed at [facility], [address] [place], [place] [address] ?  ? Special  Requests   Final  ?  BOTTLES DRAWN AEROBIC ONLY Blood Culture results may not be optimal due to an inadequate volume of blood received in culture bottles ?Performed at [facility], [address] [place], [place] [address] ?  ? Culture   Final  ?  NO GROWTH < 24 HOURS ?Performed at [facility], [address] [place], [place] [address] ?  ? Report Status PENDING  Incomplete  ?Culture, blood (routine x 2)     Status: None (Preliminary result)  ? Collection Time: 08/27/21 12:39 PM  ? Specimen: BLOOD  ?Result Value Ref Range Status  ? Specimen Description   Final  ?  BLOOD BLOOD RIGHT HAND ?Performed at [facility], [address] [place], [place] [address] ?  ? Special Requests   Final  ?  BOTTLES DRAWN AEROBIC ONLY Blood Culture adequate volume ?Performed at [facility], [address] [place], [place] [address] ?  ? Culture   Final  ?  NO GROWTH < 24 HOURS ?Performed at [facility], [address] [place], [place] [address] ?  ? Report Status PENDING  Incomplete  ? ? ?Pertinent Lab. ? ?  Latest Ref Rng & Units 08/27/2021  ? 12:37 PM 08/26/2021  ?  5:14 PM  ?CBC  ?WBC 4.0 - 10.5 K/uL 13.5   13.8    ?Hemoglobin 12.0 - 15.0 g/dL 8.5   9.4    ?Hematocrit 36.0 - 46.0 % 26.4   29.1    ?Platelets 150 - 400 K/uL 706   642    ? ? ?  Latest Ref Rng & Units 08/27/2021  ? 12:37 PM 08/26/2021  ?  5:14 PM  ?CMP  ?Glucose 70 - 99 mg/dL 92   95    ?BUN 6 - 20 mg/dL 6   8    ?Creatinine 0.44 - 1.00 mg/dL 5.780.32   4.690.34    ?Sodium 135 - 145 mmol/L 130   134    ?Potassium 3.5 - 5.1 mmol/L 3.7   4.1    ?Chloride 98 - 111 mmol/L 100   103    ?CO2 22 - 32 mmol/L 25   26    ?Calcium 8.9 - 10.3 mg/dL 7.4   8.0    ?Total Protein 6.5 - 8.1 g/dL  6.5    ?Total Bilirubin 0.3 - 1.2 mg/dL  0.6    ?Alkaline Phos 38 - 126 U/L  104    ?AST 15 - 41 U/L  76    ?ALT 0 - 44 U/L  107    ? ? ? ?Pertinent Imaging today ?Plain films and CT images have been personally visualized and  interpreted; radiology reports have been reviewed. Decision making incorporated into the Impression / Recommendations. ? ?DG THORACOLUMABAR SPINE ? ?Result Date: 08/27/2021 ?CLINICAL DATA:  Mid to lower back pain with numbness in lower extremities. EXAM: THORACOLUMBAR SPINE 1V COMPARISON:  MRI of same date FINDINGS: AP views image from approximately the top of T5 through the sacrum. Sacroiliac joints are symmetric. Lateral views image from approximately the top of T7 through the upper sacrum. Maintenance of vertebral body height across these levels. Loss of intervertebral disc height at multiple lower thoracic levels. Intervertebral disc height loss at the lumbosacral junction, presumably secondary to discitis/osteomyelitis when correlated with today's MRI. IMPRESSION: Maintenance of thoracolumbar vertebral body height loss. Intervertebral disc height loss at L5-S1 and multiple lower thoracic levels. Please see today's MRI for more definitive delineation of discitis/osteomyelitis within the lower thoracic spine and lumbosacral junction. Electronically Signed   By: Jeronimo Greaves M.D.   On: 08/27/2021 12:28  ? ?DG Shoulder Right ? ?Result Date: 08/26/2021 ?CLINICAL DATA:  History of prior clavicular fracture and right shoulder abscess, initial encounter EXAM: RIGHT SHOULDER - 2+ VIEW COMPARISON:  None. FINDINGS: Midshaft right clavicular fracture is noted with downward displacement of the distal fracture fragment. Increased density is noted over the shoulder consistent with soft tissue dressing. Some air is noted in the supraclavicular region consistent with the known history of abscess. Humeral head is well seated. Generalized increased density is noted in the visualized lung fields likely related to poor inspiratory effort. IMPRESSION: Right clavicular fracture with soft tissue changes consistent with the given clinical history of abscess. Electronically Signed   By: Alcide Clever M.D.   On: 08/26/2021 20:30  ? ?MR  CERVICAL SPINE WO CONTRAST ? ?Result Date: 08/27/2021 ?CLINICAL DATA:  Osteomyelitis.  Substance abuse EXAM: MRI CERVICAL, THORACIC AND LUMBAR SPINE WITHOUT CONTRAST TECHNIQUE: Multiplanar and multiecho pulse sequences of the

## 2021-08-28 NOTE — Transfer of Care (Deleted)
Immediate Anesthesia Transfer of Care Note ? ?Patient: Kimberly Booker ? ?Procedure(s) Performed: LUMBAR LAMINECTOMY WITH DEBRIDEMENT OF ABSCESS L 4-5 ? ?Patient Location: PACU ? ?Anesthesia Type:General ? ?Level of Consciousness: awake, patient cooperative and responds to stimulation ? ?Airway & Oxygen Therapy: Patient Spontanous Breathing and Patient connected to nasal cannula oxygen ? ?Post-op Assessment: Report given to RN, Post -op Vital signs reviewed and stable and Patient moving all extremities X 4 ? ?Post vital signs: Reviewed and stable ? ?Last Vitals:  ?Vitals Value Taken Time  ?BP    ?Temp    ?Pulse    ?Resp    ?SpO2    ? ? ?Last Pain:  ?Vitals:  ? 08/28/21 1059  ?TempSrc: Oral  ?PainSc:   ?   ? ?Patients Stated Pain Goal: 3 (08/28/21 0100) ? ?Complications: No notable events documented. ?

## 2021-08-29 ENCOUNTER — Inpatient Hospital Stay (HOSPITAL_COMMUNITY): Payer: Medicaid Other

## 2021-08-29 ENCOUNTER — Inpatient Hospital Stay (HOSPITAL_COMMUNITY): Payer: Medicaid Other | Admitting: Anesthesiology

## 2021-08-29 ENCOUNTER — Encounter (HOSPITAL_COMMUNITY)
Admission: AD | Disposition: A | Discharge status: Home / Self Care | Payer: Self-pay | Source: Other Acute Inpatient Hospital | Attending: Internal Medicine

## 2021-08-29 ENCOUNTER — Encounter (HOSPITAL_COMMUNITY): Payer: Self-pay | Admitting: Neurosurgery

## 2021-08-29 DIAGNOSIS — R7881 Bacteremia: Secondary | ICD-10-CM

## 2021-08-29 DIAGNOSIS — J90 Pleural effusion, not elsewhere classified: Secondary | ICD-10-CM

## 2021-08-29 DIAGNOSIS — K668 Other specified disorders of peritoneum: Secondary | ICD-10-CM

## 2021-08-29 HISTORY — PX: THORACENTESIS: SHX235

## 2021-08-29 HISTORY — PX: LAPAROSCOPY: SHX197

## 2021-08-29 HISTORY — PX: LAPAROTOMY: SHX154

## 2021-08-29 LAB — BASIC METABOLIC PANEL
Anion gap: 9 (ref 5–15)
Anion gap: 6 (ref 5–15)
BUN: 12 mg/dL (ref 6–20)
BUN: 17 mg/dL (ref 6–20)
CO2: 24 mmol/L (ref 22–32)
CO2: 26 mmol/L (ref 22–32)
Calcium: 8 mg/dL — ABNORMAL LOW (ref 8.9–10.3)
Calcium: 7.9 mg/dL — ABNORMAL LOW (ref 8.9–10.3)
Chloride: 101 mmol/L (ref 98–111)
Chloride: 104 mmol/L (ref 98–111)
Creatinine, Ser: 0.34 mg/dL — ABNORMAL LOW (ref 0.44–1.00)
Creatinine, Ser: 0.34 mg/dL — ABNORMAL LOW (ref 0.44–1.00)
GFR, Estimated: >60 mL/min (ref >60)
GFR, Estimated: >60 mL/min (ref >60)
Glucose, Bld: 168 mg/dL — ABNORMAL HIGH (ref 70–99)
Glucose, Bld: 105 mg/dL — ABNORMAL HIGH (ref 70–99)
Potassium: 4.5 mmol/L (ref 3.5–5.1)
Potassium: 4.1 mmol/L (ref 3.5–5.1)
Sodium: 134 mmol/L — ABNORMAL LOW (ref 135–145)
Sodium: 136 mmol/L (ref 135–145)

## 2021-08-29 LAB — BODY FLUID CELL COUNT WITH DIFFERENTIAL
Eos, Fluid: 2 %
Lymphs, Fluid: 73 %
Monocyte-Macrophage-Serous Fluid: 6 % — ABNORMAL LOW (ref 50–90)
Neutrophil Count, Fluid: 19 % (ref 0–25)
Total Nucleated Cell Count, Fluid: 665 cu mm (ref 0–1000)

## 2021-08-29 LAB — CBC WITH DIFFERENTIAL/PLATELET
Abs Immature Granulocytes: 0.27 10*3/uL — ABNORMAL HIGH (ref 0.00–0.07)
Basophils Absolute: 0 10*3/uL (ref 0.0–0.1)
Basophils Relative: 0 %
Eosinophils Absolute: 0 10*3/uL (ref 0.0–0.5)
Eosinophils Relative: 0 %
HCT: 27.1 % — ABNORMAL LOW (ref 36.0–46.0)
Hemoglobin: 8.7 g/dL — ABNORMAL LOW (ref 12.0–15.0)
Immature Granulocytes: 2 %
Lymphocytes Relative: 11 %
Lymphs Abs: 1.7 10*3/uL (ref 0.7–4.0)
MCH: 30.5 pg (ref 26.0–34.0)
MCHC: 32.1 g/dL (ref 30.0–36.0)
MCV: 95.1 fL (ref 80.0–100.0)
Monocytes Absolute: 0.6 10*3/uL (ref 0.1–1.0)
Monocytes Relative: 4 %
Neutro Abs: 13.2 10*3/uL — ABNORMAL HIGH (ref 1.7–7.7)
Neutrophils Relative %: 83 %
Platelets: 646 10*3/uL — ABNORMAL HIGH (ref 150–400)
RBC: 2.85 MIL/uL — ABNORMAL LOW (ref 3.87–5.11)
RDW: 14.4 % (ref 11.5–15.5)
WBC: 15.8 10*3/uL — ABNORMAL HIGH (ref 4.0–10.5)
nRBC: 0 % (ref 0.0–0.2)

## 2021-08-29 LAB — PROTEIN, PLEURAL OR PERITONEAL FLUID: Total protein, fluid: 3.4 g/dL

## 2021-08-29 LAB — LACTATE DEHYDROGENASE, PLEURAL OR PERITONEAL FLUID: LD, Fluid: 189 U/L — ABNORMAL HIGH (ref 3–23)

## 2021-08-29 LAB — ECHOCARDIOGRAM COMPLETE
Height: 67 in
S' Lateral: 3 cm
Weight: 3047.64 oz

## 2021-08-29 LAB — CBC
HCT: 26.9 % — ABNORMAL LOW (ref 36.0–46.0)
Hemoglobin: 8.7 g/dL — ABNORMAL LOW (ref 12.0–15.0)
MCH: 30.7 pg (ref 26.0–34.0)
MCHC: 32.3 g/dL (ref 30.0–36.0)
MCV: 95.1 fL (ref 80.0–100.0)
Platelets: 627 10*3/uL — ABNORMAL HIGH (ref 150–400)
RBC: 2.83 MIL/uL — ABNORMAL LOW (ref 3.87–5.11)
RDW: 14.4 % (ref 11.5–15.5)
WBC: 18.3 10*3/uL — ABNORMAL HIGH (ref 4.0–10.5)
nRBC: 0 % (ref 0.0–0.2)

## 2021-08-29 LAB — HEPATITIS B SURFACE ANTIGEN: Hepatitis B Surface Ag: NONREACTIVE

## 2021-08-29 LAB — GLUCOSE, PLEURAL OR PERITONEAL FLUID: Glucose, Fluid: 129 mg/dL

## 2021-08-29 LAB — HEPATITIS C ANTIBODY: HCV Ab: REACTIVE — AB

## 2021-08-29 LAB — HEPATITIS B CORE ANTIBODY, TOTAL: Hep B Core Total Ab: NONREACTIVE

## 2021-08-29 LAB — LACTATE DEHYDROGENASE: LDH: 196 U/L — ABNORMAL HIGH (ref 98–192)

## 2021-08-29 SURGERY — LAPAROSCOPY, DIAGNOSTIC
Anesthesia: General | Site: Abdomen

## 2021-08-29 SURGERY — THORACENTESIS
Anesthesia: LOCAL | Laterality: Right

## 2021-08-29 MED ORDER — LIDOCAINE 2% (20 MG/ML) 5 ML SYRINGE
INTRAMUSCULAR | Status: DC | PRN
  Administered 2021-08-29: 40 mg via INTRAVENOUS

## 2021-08-29 MED ORDER — BUPIVACAINE-EPINEPHRINE (PF) 0.25% -1:200000 IJ SOLN
INTRAMUSCULAR | Status: AC
  Filled 2021-08-29: qty 30

## 2021-08-29 MED ORDER — FENTANYL CITRATE (PF) 250 MCG/5ML IJ SOLN
INTRAMUSCULAR | Status: DC | PRN
  Administered 2021-08-29 (×2): 50 ug via INTRAVENOUS
  Administered 2021-08-29: 100 ug via INTRAVENOUS
  Administered 2021-08-29 – 2021-08-30 (×3): 50 ug via INTRAVENOUS

## 2021-08-29 MED ORDER — MIDAZOLAM HCL 2 MG/2ML IJ SOLN
INTRAMUSCULAR | Status: AC
  Filled 2021-08-29: qty 2

## 2021-08-29 MED ORDER — 0.9 % SODIUM CHLORIDE (POUR BTL) OPTIME
TOPICAL | Status: DC | PRN
Start: 2021-08-29 — End: 2021-08-30
  Administered 2021-08-29: 2000 mL

## 2021-08-29 MED ORDER — PROPOFOL 10 MG/ML IV BOLUS
INTRAVENOUS | Status: DC | PRN
  Administered 2021-08-29: 130 mg via INTRAVENOUS

## 2021-08-29 MED ORDER — SODIUM CHLORIDE 0.9 % IV SOLN: INTRAVENOUS | Status: DC | PRN

## 2021-08-29 MED ORDER — ALBUMIN HUMAN 5 % IV SOLN: INTRAVENOUS | Status: DC | PRN

## 2021-08-29 MED ORDER — VANCOMYCIN HCL 10 G IV SOLR
2000.0000 mg | Freq: Once | INTRAVENOUS | Status: AC
  Administered 2021-08-29: 2000 mg via INTRAVENOUS
  Filled 2021-08-29: qty 20

## 2021-08-29 MED ORDER — KETOROLAC TROMETHAMINE 30 MG/ML IJ SOLN
30.0000 mg | Freq: Once | INTRAMUSCULAR | Status: AC
  Administered 2021-08-29: 30 mg via INTRAVENOUS
  Filled 2021-08-29: qty 1

## 2021-08-29 MED ORDER — VANCOMYCIN HCL 1250 MG/250ML IV SOLN
1250.0000 mg | Freq: Two times a day (BID) | INTRAVENOUS | Status: DC
  Administered 2021-08-29 – 2021-09-01 (×6): 1250 mg via INTRAVENOUS
  Filled 2021-08-29 (×6): qty 250

## 2021-08-29 MED ORDER — VANCOMYCIN HCL 1.25 G IV SOLR
1250.0000 mg | Freq: Two times a day (BID) | INTRAVENOUS | Status: DC
  Filled 2021-08-29 (×2): qty 25

## 2021-08-29 MED ORDER — DIPHENHYDRAMINE HCL 50 MG/ML IJ SOLN
INTRAMUSCULAR | Status: DC | PRN
  Administered 2021-08-29: 12.5 mg via INTRAVENOUS

## 2021-08-29 MED ORDER — FENTANYL CITRATE (PF) 250 MCG/5ML IJ SOLN
INTRAMUSCULAR | Status: AC
  Filled 2021-08-29: qty 5

## 2021-08-29 MED ORDER — DEXAMETHASONE SODIUM PHOSPHATE 10 MG/ML IJ SOLN
INTRAMUSCULAR | Status: DC | PRN
  Administered 2021-08-29: 5 mg via INTRAVENOUS

## 2021-08-29 MED ORDER — MIDAZOLAM HCL 2 MG/2ML IJ SOLN
INTRAMUSCULAR | Status: DC | PRN
  Administered 2021-08-29: 2 mg via INTRAVENOUS

## 2021-08-29 MED ORDER — METRONIDAZOLE 500 MG/100ML IV SOLN
500.0000 mg | INTRAVENOUS | Status: AC
  Administered 2021-08-29: 500 mg via INTRAVENOUS
  Filled 2021-08-29: qty 100

## 2021-08-29 MED ORDER — ROCURONIUM BROMIDE 10 MG/ML (PF) SYRINGE
PREFILLED_SYRINGE | INTRAVENOUS | Status: DC | PRN
Start: 2021-08-29 — End: 2021-08-30
  Administered 2021-08-29: 30 mg via INTRAVENOUS
  Administered 2021-08-29: 40 mg via INTRAVENOUS
  Administered 2021-08-30: 30 mg via INTRAVENOUS

## 2021-08-29 MED ORDER — ENOXAPARIN SODIUM 40 MG/0.4ML IJ SOSY
40.0000 mg | PREFILLED_SYRINGE | INTRAMUSCULAR | Status: DC
  Administered 2021-08-29 – 2021-10-24 (×55): 40 mg via SUBCUTANEOUS
  Filled 2021-08-29 (×61): qty 0.4

## 2021-08-29 MED ORDER — SUCCINYLCHOLINE CHLORIDE 200 MG/10ML IV SOSY
PREFILLED_SYRINGE | INTRAVENOUS | Status: DC | PRN
Start: 2021-08-29 — End: 2021-08-30
  Administered 2021-08-29: 140 mg via INTRAVENOUS

## 2021-08-29 MED ORDER — LACTATED RINGERS IV SOLN
INTRAVENOUS | Status: DC | PRN
Start: 2021-08-29 — End: 2021-08-30

## 2021-08-29 MED ORDER — PROPOFOL 10 MG/ML IV BOLUS
INTRAVENOUS | Status: AC
  Filled 2021-08-29: qty 20

## 2021-08-29 MED ORDER — IOHEXOL 350 MG/ML SOLN
100.0000 mL | Freq: Once | INTRAVENOUS | Status: AC | PRN
  Administered 2021-08-29: 100 mL via INTRAVENOUS

## 2021-08-29 SURGICAL SUPPLY — 64 items
BAG COUNTER SPONGE SURGICOUNT (BAG) ×3 IMPLANT
BLADE CLIPPER SURG (BLADE) IMPLANT
CANISTER SUCT 3000ML PPV (MISCELLANEOUS) IMPLANT
CHLORAPREP W/TINT 26 (MISCELLANEOUS) ×3 IMPLANT
COVER SURGICAL LIGHT HANDLE (MISCELLANEOUS) ×3 IMPLANT
DERMABOND ADVANCED (GAUZE/BANDAGES/DRESSINGS) ×1
DERMABOND ADVANCED .7 DNX12 (GAUZE/BANDAGES/DRESSINGS) IMPLANT
DRAIN CHANNEL 19F RND (DRAIN) ×1 IMPLANT
DRAPE LAPAROSCOPIC ABDOMINAL (DRAPES) ×1 IMPLANT
DRAPE WARM FLUID 44X44 (DRAPES) ×1 IMPLANT
ELECT BLADE 4.0 EZ CLEAN MEGAD (MISCELLANEOUS) ×3
ELECT REM PT RETURN 9FT ADLT (ELECTROSURGICAL) ×3
ELECTRODE BLDE 4.0 EZ CLN MEGD (MISCELLANEOUS) IMPLANT
ELECTRODE REM PT RTRN 9FT ADLT (ELECTROSURGICAL) ×2 IMPLANT
EVACUATOR SILICONE 100CC (DRAIN) ×1 IMPLANT
GAUZE SPONGE 4X4 12PLY STRL LF (GAUZE/BANDAGES/DRESSINGS) ×1 IMPLANT
GLOVE SURG POLY MICRO LF SZ5.5 (GLOVE) ×4 IMPLANT
GLOVE SURG UNDER POLY LF SZ6 (GLOVE) ×3 IMPLANT
GLOVE SURG UNDER POLY LF SZ6.5 (GLOVE) ×1 IMPLANT
GLOVE SURG UNDER POLY LF SZ7.5 (GLOVE) ×3 IMPLANT
GOWN STRL REUS W/ TWL LRG LVL3 (GOWN DISPOSABLE) ×4 IMPLANT
GOWN STRL REUS W/TWL LRG LVL3 (GOWN DISPOSABLE) ×3
HANDLE SUCTION POOLE (INSTRUMENTS) IMPLANT
KIT BASIN OR (CUSTOM PROCEDURE TRAY) ×3 IMPLANT
KIT TURNOVER KIT B (KITS) ×3 IMPLANT
NDL HYPO 25GX1X1/2 BEV (NEEDLE) IMPLANT
NDL INSUFFLATION 14GA 120MM (NEEDLE) ×2 IMPLANT
NEEDLE HYPO 25GX1X1/2 BEV (NEEDLE) ×3 IMPLANT
NEEDLE INSUFFLATION 14GA 120MM (NEEDLE) ×3 IMPLANT
NS IRRIG 1000ML POUR BTL (IV SOLUTION) ×5 IMPLANT
PACK GENERAL/GYN (CUSTOM PROCEDURE TRAY) ×1 IMPLANT
PAD ABD 8X10 STRL (GAUZE/BANDAGES/DRESSINGS) ×1 IMPLANT
PAD ARMBOARD 7.5X6 YLW CONV (MISCELLANEOUS) ×6 IMPLANT
RETRACTOR WOUND ALXS 34CM XLRG (MISCELLANEOUS) IMPLANT
RTRCTR WOUND ALEXIS 34CM XLRG (MISCELLANEOUS) ×3
SCISSORS LAP 5X35 DISP (ENDOMECHANICALS) IMPLANT
SET IRRIG TUBING LAPAROSCOPIC (IRRIGATION / IRRIGATOR) IMPLANT
SET TUBE SMOKE EVAC HIGH FLOW (TUBING) ×3 IMPLANT
SLEEVE ENDOPATH XCEL 5M (ENDOMECHANICALS) ×5 IMPLANT
SPONGE T-LAP 18X18 ~~LOC~~+RFID (SPONGE) ×3 IMPLANT
STAPLER VISISTAT 35W (STAPLE) ×1 IMPLANT
SUCTION POOLE HANDLE (INSTRUMENTS) ×3
SUT ETHILON 2 0 FS 18 (SUTURE) ×1 IMPLANT
SUT MNCRL AB 3-0 PS2 27 (SUTURE) ×1 IMPLANT
SUT MNCRL AB 4-0 PS2 18 (SUTURE) ×2 IMPLANT
SUT PDS AB 1 TP1 96 (SUTURE) ×2 IMPLANT
SUT SILK 2 0 (SUTURE) ×1
SUT SILK 2 0 SH CR/8 (SUTURE) ×1 IMPLANT
SUT SILK 2-0 18XBRD TIE 12 (SUTURE) IMPLANT
SUT SILK 3 0 (SUTURE) ×1
SUT SILK 3 0SH CR/8 30 (SUTURE) ×1 IMPLANT
SUT SILK 3-0 18XBRD TIE 12 (SUTURE) IMPLANT
SUT VIC AB 3-0 SH 27 (SUTURE) ×2
SUT VIC AB 3-0 SH 27XBRD (SUTURE) IMPLANT
SUT VIC AB 3-0 SH 8-18 (SUTURE) ×1 IMPLANT
SYR 5ML LUER SLIP (SYRINGE) ×1 IMPLANT
SYR CONTROL 10ML LL (SYRINGE) ×2 IMPLANT
TOWEL GREEN STERILE (TOWEL DISPOSABLE) ×3 IMPLANT
TOWEL GREEN STERILE FF (TOWEL DISPOSABLE) ×3 IMPLANT
TRAY FOLEY MTR SLVR 14FR STAT (SET/KITS/TRAYS/PACK) ×1 IMPLANT
TRAY LAPAROSCOPIC MC (CUSTOM PROCEDURE TRAY) ×2 IMPLANT
TROCAR XCEL BLUNT TIP 100MML (ENDOMECHANICALS) IMPLANT
TROCAR XCEL NON-BLD 5MMX100MML (ENDOMECHANICALS) ×2 IMPLANT
WARMER LAPAROSCOPE (MISCELLANEOUS) ×3 IMPLANT

## 2021-08-29 NOTE — Progress Notes (Signed)
?  Echocardiogram ?2D Echocardiogram has been performed. ? ?Delcie Roch ?08/29/2021, 5:26 PM ?

## 2021-08-29 NOTE — Progress Notes (Addendum)
ID Brief Note ? ?CT CAP reviewed  ? ?PCCM vs CT surgery consult for rt lung empyema  ? 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 ?Needs drainage of the bilateral multilocular psoas abscesses for source control. Recommend IR guided drainage if no surgical intervention recommended ? ?TTE has been ordered, but needs TEE as well ? ?Will change Daptomycin to Vancomycin, pharmacy to dose. Cr WNL  ?Fu blood cultures from [facility] as well as here + OR cultures  ?Monitor CBC, BMP and Vancomycin trough  ? ?New ID team will assume care starting tomorrow ? ?Odette Fraction, MD ?Infectious Disease Physician ?[facility] ?[address] ?[place], [place] [address] ?Phone: [phone]  Fax: [phone] ? ? ?

## 2021-08-29 NOTE — Consult Note (Addendum)
? ?NAME:  Kimberly Booker, MRN:  [id number], DOB:  07/22/1985, LOS: 3 ?ADMISSION DATE:  08/26/2021, CONSULTATION DATE: 3/26 ?REFERRING MD: Dr. Renford Dills, CHIEF COMPLAINT: Shoulder pain ? ?History of Present Illness:  ?36 year old female who presented to [facility] from [facility] with reports of right shoulder pain. ? ?The patient reportedly suffered a right clavicle navicular fracture approximately 8 months prior to admission.  She has known history of IV drug abuse.  She reported increasing redness and pain in the right shoulder for approximately 2 weeks prior to admission to [facility] (08/15/21).  The patient had taken a razor blade to open the abscess for drainage.  She was prescribed clindamycin.  Despite antibiotics she continued to feel poorly and was admitted on 3/12 to [facility].  She was found to have MRSA bacteremia and right shoulder abscess.  She had a TTE that was not suggestive of endocarditis but CT of the chest showed possible septic emboli.  Shoulder imaging was concerning for possible osteosarcoma versus phlegmon and the patient was transferred to [facility] for orthopedic services.  She was treated with ceftriaxone, vancomycin and later transitioned to daptomycin.  Despite antibiotics she continued to have positive blood cultures.   ? ?Imaging of her spine revealed an L5-S1 discitis with osteomyelitis, bilateral SI septic arthritis.  In addition she had bilateral iliopsoas abscess and dorsal epidural abscess at L4-L5 with compression of thecal sac.  Right shoulder showed an ill-defined fluid and phlegmon that appears to drain to the surface of the skin anteriorly on MRI.  Patient was seen by neurosurgery and was taken on 3/25 for L4-L5 laminectomy and debridement of epidural abscess.  Patient reports significant relief from pain after debridement.  CT of the chest on 3/25 demonstrates a large loculated right pleural effusion, destructive process of the right mid clavicle,  subcentimeter groundglass nodularity within bilateral lungs concerning for septic emboli. ? ?PCCM consulted for evaluation of pleural effusion. ? ?Pertinent  Medical History  ?Depression ?Anxiety ?Suicide attempts ?Polysubstance abuse ?Severe protein calorie malnutrition ?MRSA bacteremia ? ?Significant Hospital Events: ?Including procedures, antibiotic start and stop dates in addition to other pertinent events   ?3/23 admit to [facility] from [facility] with MRSA bacteremia, osteomyelitis/septic arthritis ?3/25 2 OR for laminectomy and debridement of epidural abscess.  CT chest with large right pleural effusion, concern for septic emboli bilaterally  ?3/26 PCCM consulted for evaluation of pleural effusion ? ?Interim History / Subjective:  ?As above ? ?Objective   ?Blood pressure 117/71, pulse 97, temperature 98.4 ?F (36.9 ?C), temperature source Oral, resp. rate 17, height 5\' 7"  (1.702 m), weight 86.4 kg, SpO2 97 %. ?   ?   ? ?Intake/Output Summary (Last 24 hours) at 08/29/2021 1014 ?Last data filed at 08/29/2021 0230 ?Gross per 24 hour  ?Intake 1000 ml  ?Output 1315 ml  ?Net -315 ml  ? ?Filed Weights  ? 08/26/21 1733 08/28/21 0500 08/28/21 1059  ?Weight: 87.2 kg 86.4 kg 86.4 kg  ? ? ?Examination: ?General: Chronically ill-appearing adult female in no acute distress ?HENT: MM pink/moist, poor dentition, anicteric, pupils equal/reactive ?Lungs: non-labored at rest, good air entry with clear breath sounds bilaterally ?Cardiovascular: S1-S2 RRR, no appreciable murmurs ?Abdomen: Soft, nontender, BS x4 active ?Extremities: Warm/dry, multiple tattoos, multiple skin lesions ?Neuro: AAOx4, MAE, normal strength ? ?Resolved Hospital Problem list   ? ? ?Assessment & Plan:  ? ?MRSA Bacteremia ?Septic Arthritis, Epidural Abscess, Right Shoulder Osteoarthritis ?Large Right Pleural Effusion  ?Suspected Septic Emboli ?IVDA  ? ?-  plan for drainage of right pleural space, will begin with thoracentesis but may ultimately need CT  placement  ?-ID following, defer abx to ID  ?-continue vancomycin  ?-consider TEE given hx, defer to ID ?-follow repeat blood cultures  ?-follow intermittent CXR  ? ? ?Best Practice (right click and "Reselect all SmartList Selections" daily)  ?Per primary  ? ?Labs   ?CBC: ?Recent Labs  ?Lab 08/26/21 ?1714 08/27/21 ?1237 08/29/21 ?0442  ?WBC 13.8* 13.5* 15.8*  ?NEUTROABS  --   --  13.2*  ?HGB 9.4* 8.5* 8.7*  ?HCT 29.1* 26.4* 27.1*  ?MCV 96.0 95.7 95.1  ?PLT 642* 706* 646*  ? ? ?Basic Metabolic Panel: ?Recent Labs  ?Lab 08/26/21 ?1714 08/27/21 ?1237 08/29/21 ?0442  ?NA 134* 130* 134*  ?K 4.1 3.7 4.5  ?CL 103 100 101  ?CO2 26 25 24   ?GLUCOSE 95 92 168*  ?BUN 8 6 12   ?CREATININE 0.34* 0.32* 0.34*  ?CALCIUM 8.0* 7.4* 8.0*  ? ?GFR: ?Estimated Creatinine Clearance: 109.7 mL/min (A) (by C-G formula based on SCr of 0.34 mg/dL (L)). ?Recent Labs  ?Lab 08/26/21 ?1714 08/27/21 ?1237 08/29/21 ?0442  ?WBC 13.8* 13.5* 15.8*  ? ? ?Liver Function Tests: ?Recent Labs  ?Lab 08/26/21 ?1714  ?AST 76*  ?ALT 107*  ?ALKPHOS 104  ?BILITOT 0.6  ?PROT 6.5  ?ALBUMIN 1.9*  ? ?No results for input(s): LIPASE, AMYLASE in the last 168 hours. ?No results for input(s): AMMONIA in the last 168 hours. ? ?ABG ?No results found for: PHART, PCO2ART, PO2ART, HCO3, TCO2, ACIDBASEDEF, O2SAT  ? ?Coagulation Profile: ?No results for input(s): INR, PROTIME in the last 168 hours. ? ?Cardiac Enzymes: ?Recent Labs  ?Lab 08/27/21 ?0321  ?CKTOTAL 14*  ? ? ?HbA1C: ?No results found for: HGBA1C ? ?CBG: ?No results for input(s): GLUCAP in the last 168 hours. ? ?Review of Systems: Positives in [place]   ?Gen: Denies fever, chills, weight change, fatigue, night sweats ?HEENT: Denies blurred vision, double vision, hearing loss, tinnitus, sinus congestion, rhinorrhea, sore throat, neck stiffness, dysphagia ?PULM: Denies shortness of breath, chest discomfort on right side with coughing/deep breathing, cough, sputum production, hemoptysis, wheezing ?CV: Denies chest pain,  edema, orthopnea, paroxysmal nocturnal dyspnea, palpitations ?GI: Denies abdominal pain, nausea, vomiting, diarrhea, hematochezia, melena, constipation, change in bowel habits ?GU: Denies dysuria, hematuria, polyuria, oliguria, urethral discharge ?Endocrine: Denies hot or cold intolerance, polyuria, polyphagia or appetite change ?Derm: Denies rash, dry skin, scaling or peeling skin change ?Heme: Denies easy bruising, bleeding, bleeding gums ?Neuro: Denies headache, numbness, weakness, slurred speech, loss of memory or consciousness ? ?Past Medical History:  ?She,  has a past medical history of Anxiety with depression (08/26/2021), Polysubstance abuse (HCC) (08/26/2021), and Suicide attempt by drug overdose (HCC) (08/26/2021).  ? ?Surgical History:  ? ?Past Surgical History:  ?Procedure Laterality Date  ? LUMBAR LAMINECTOMY/DECOMPRESSION MICRODISCECTOMY N/A 08/28/2021  ? Procedure: LUMBAR LAMINECTOMY WITH DEBRIDEMENT OF ABSCESS Lumbar four - five;  Surgeon: Lisbeth RenshawNundkumar, Neelesh, MD;  Location: [facility] OR;  Service: Neurosurgery;  Laterality: N/A;  ?  ? ?Social History:  ? reports that she has been smoking cigarettes. She has never used smokeless tobacco. She reports that she does not currently use alcohol. She reports current drug use. Drugs: Methamphetamines and Heroin.  ? ?Family History:  ?Her family history includes Hypertension in an other family member.  ? ?Allergies ?Allergies  ?Allergen Reactions  ? Amoxicillin Swelling  ?  Throat closes  ?  ? ?Home Medications  ?Prior to Admission medications   ?  Medication Sig Start Date End Date Taking? Authorizing Provider  ?ibuprofen (ADVIL) 200 MG tablet Take 800 mg by mouth every 6 (six) hours as needed for moderate pain.   Yes [provider]  ?FLUoxetine (PROZAC) 40 MG capsule Take 40 mg by mouth daily.    [provider]  ?  ? ?Critical care time: n/a ?  ? ?Canary Brim, MSN, APRN, NP-C, AGACNP-BC ?[facility] ?08/29/2021, 10:34  AM ? ? ?Please see [email or web address] for pager details.  ? ?From 7A-7P if no response, please call [phone] ?After hours, please call Pola Corn [phone] ? ? ? ? ?

## 2021-08-29 NOTE — Consult Note (Signed)
? ?Kimberly PotashDiana G Booker ?02/26/1986  ?[id number].   ? ?Requesting MD: Canary BrimBrandi Ollis, NP ?Chief Complaint/Reason for Consult: pneumoperitoneum ? ?HPI:  ?Ms. Kimberly Booker is a 36 yo female who was admitted with MRSA bacteremia and a right shoulder abscess on 3/23 [place] a [place] been hospitalized there since 3/12). She was treated with broad-spectrum antibiotics and continued to have positive cultures. Further imaging workup showed a lumbar spinal epidural abscess. She underwent L4-L5 laminectomy and debridement yesterday. She then had a CT scan yesterday evening that showed a large right pleural effusion. She underwent placement of a right sided pigtail pleural catheter earlier today. A post-procedure CXR showed possible pneumoperitoneum, which was confirmed on a KUB. The patient then had an abdominal CT scan that showed pneumoperitoneum and small volume pelvic free fluid, but no clear source of the free air. General surgery was consulted. ? ?On my exam the patient reports she started having upper abdominal pain after the procedure. It is associated with nausea but no vomiting. She was not having this pain before today. She endorses very frequent NSAID use prior to admission. Prior abdominal surgeries include a C section and hysterectomy. ? ?ROS: ?Review of Systems  ?Constitutional:  Negative for chills and fever.  ?HENT:  Negative for sore throat.   ?Eyes:  Negative for pain and redness.  ?Respiratory:  Positive for shortness of breath. Negative for stridor.   ?Cardiovascular:  Negative for chest pain.  ?Gastrointestinal:  Positive for abdominal pain and nausea. Negative for vomiting.  ?Neurological:  Negative for loss of consciousness and weakness.  ? ?Family History  ?Problem Relation Age of Onset  ? Hypertension Other   ? ? ?Past Medical History:  ?Diagnosis Date  ? Anxiety with depression 08/26/2021  ? Polysubstance abuse (HCC) 08/26/2021  ? Suicide attempt by drug overdose (HCC) 08/26/2021  ? ? ?Past  Surgical History:  ?Procedure Laterality Date  ? LUMBAR LAMINECTOMY/DECOMPRESSION MICRODISCECTOMY N/A 08/28/2021  ? Procedure: LUMBAR LAMINECTOMY WITH DEBRIDEMENT OF ABSCESS Lumbar four - five;  Surgeon: Lisbeth RenshawNundkumar, Neelesh, MD;  Location: [facility] OR;  Service: Neurosurgery;  Laterality: N/A;  ? ? ?Social History:  reports that she has been smoking cigarettes. She has never used smokeless tobacco. She reports that she does not currently use alcohol. She reports current drug use. Drugs: Methamphetamines and Heroin. ? ?Allergies:  ?Allergies  ?Allergen Reactions  ? Amoxicillin Swelling  ?  Throat closes  ? ? ?Medications Prior to Admission  ?Medication Sig Dispense Refill  ? ibuprofen (ADVIL) 200 MG tablet Take 800 mg by mouth every 6 (six) hours as needed for moderate pain.    ? FLUoxetine (PROZAC) 40 MG capsule Take 40 mg by mouth daily.    ? ? ? ?Physical Exam: ?Blood pressure 110/80, pulse 93, temperature 98.7 ?F (37.1 ?C), temperature source Oral, resp. rate 17, height 5\' 7"  (1.702 m), weight 86.4 kg, SpO2 100 %. ?General: resting comfortably, appears stated age, no apparent distress ?Neurological: alert and oriented, no focal deficits, cranial nerves grossly in tact ?HEENT: normocephalic, atraumatic, no scleral icterus ?CV: extremities warm and well-perfused ?Respiratory: normal work of breathing, symmetric chest wall expansion ?Abdomen: soft, nondistended, tender to palpation in epigastric area. No masses or organomegaly. ?Extremities: warm and well-perfused, no deformities, moving all extremities spontaneously ?Psychiatric: normal mood and affect ?Skin: warm and dry, no jaundice, no rashes or lesions ? ? ?Results for orders placed or performed during the hospital encounter of 08/26/21 (from the past 48 hour(s))  ?Type and  screen [facility]     Status: None  ? Collection Time: 08/28/21  2:48 AM  ?Result Value Ref Range  ? ABO/RH(D) O POS   ? Antibody Screen NEG   ? Sample Expiration    ?   08/31/2021,2359 ?Performed at [facility], [address] [place], [place] [address] ?  ?hCG, serum, qualitative     Status: None  ? Collection Time: 08/28/21 10:14 AM  ?Result Value Ref Range  ? Preg, Serum NEGATIVE NEGATIVE  ?  Comment:        ?THE SENSITIVITY OF THIS ?METHODOLOGY IS >10 mIU/mL. ?Performed at [facility], [address], [place], [place] [address] ?  ?Aerobic/Anaerobic Culture w Gram Stain (surgical/deep wound)     Status: None (Preliminary result)  ? Collection Time: 08/28/21  1:42 PM  ? Specimen: Wound; Abscess  ?Result Value Ref Range  ? Specimen Description WOUND   ? Special Requests LUMBAR EPIDURAL ABSCESS SPEC A   ? Gram Stain NO WBC SEEN ?RARE GRAM POSITIVE COCCI ?   ? Culture    ?  CULTURE REINCUBATED FOR BETTER GROWTH ?Performed at [facility], [address], [place], [place] [address] ?  ? Report Status PENDING   ?CBC with Differential/Platelet     Status: Abnormal  ? Collection Time: 08/29/21  4:42 AM  ?Result Value Ref Range  ? WBC 15.8 (H) 4.0 - 10.5 K/uL  ? RBC 2.85 (L) 3.87 - 5.11 MIL/uL  ? Hemoglobin 8.7 (L) 12.0 - 15.0 g/dL  ? HCT 27.1 (L) 36.0 - 46.0 %  ? MCV 95.1 80.0 - 100.0 fL  ? MCH 30.5 26.0 - 34.0 pg  ? MCHC 32.1 30.0 - 36.0 g/dL  ? RDW 14.4 11.5 - 15.5 %  ? Platelets 646 (H) 150 - 400 K/uL  ? nRBC 0.0 0.0 - 0.2 %  ? Neutrophils Relative % 83 %  ? Neutro Abs 13.2 (H) 1.7 - 7.7 K/uL  ? Lymphocytes Relative 11 %  ? Lymphs Abs 1.7 0.7 - 4.0 K/uL  ? Monocytes Relative 4 %  ? Monocytes Absolute 0.6 0.1 - 1.0 K/uL  ? Eosinophils Relative 0 %  ? Eosinophils Absolute 0.0 0.0 - 0.5 K/uL  ? Basophils Relative 0 %  ? Basophils Absolute 0.0 0.0 - 0.1 K/uL  ? Immature Granulocytes 2 %  ? Abs Immature Granulocytes 0.27 (H) 0.00 - 0.07 K/uL  ?  Comment: Performed at [facility], [address], [place], [place] [address]  ?Basic metabolic panel     Status: Abnormal  ? Collection Time: 08/29/21  4:42 AM  ?Result Value Ref Range  ? Sodium 134 (L) 135 - 145  mmol/L  ? Potassium 4.5 3.5 - 5.1 mmol/L  ? Chloride 101 98 - 111 mmol/L  ? CO2 24 22 - 32 mmol/L  ? Glucose, Bld 168 (H) 70 - 99 mg/dL  ?  Comment: Glucose reference range applies only to samples taken after fasting for at least 8 hours.  ? BUN 12 6 - 20 mg/dL  ? Creatinine, Ser 0.34 (L) 0.44 - 1.00 mg/dL  ? Calcium 8.0 (L) 8.9 - 10.3 mg/dL  ? GFR, Estimated >60 >60 mL/min  ?  Comment: (NOTE) ?Calculated using the CKD-EPI Creatinine Equation (2021) ?  ? Anion gap 9 5 - 15  ?  Comment: Performed at [facility], [address], [place], [place] [address]  ?Hepatitis C antibody  Status: Abnormal  ? Collection Time: 08/29/21  4:42 AM  ?Result Value Ref Range  ? HCV Ab Reactive (A) NON REACTIVE  ?  Comment: (NOTE) ?The CDC recommends that a Reactive HCV antibody result be followed up  ?with a HCV Nucleic Acid Amplification test. ? ?Performed at [facility], [address], [place], [place] ?[address] ?  ?Marland KitchenHepatitis B Surface Antigen     Status: None  ? Collection Time: 08/29/21  4:42 AM  ?Result Value Ref Range  ? Hepatitis B Surface Ag NON REACTIVE NON REACTIVE  ?  Comment: Performed at [facility], [address] [place], [place] [address]  ?Hepatitis B core antibody, total     Status: None  ? Collection Time: 08/29/21  4:42 AM  ?Result Value Ref Range  ? Hep B Core Total Ab NON REACTIVE NON REACTIVE  ?  Comment: Performed at [facility], [address] [place], [place] [address]  ?Body fluid cell count with differential     Status: Abnormal  ? Collection Time: 08/29/21  2:53 PM  ?Result Value Ref Range  ? Fluid Type-FCT PLEURAL   ?  Comment: RIGHT ?FLUID ?  ? Color, Fluid AMBER (A) YELLOW  ? Appearance, Fluid CLOUDY (A) CLEAR  ? Total Nucleated Cell Count, Fluid 665 0 - 1,000 cu mm  ? Neutrophil Count, Fluid 19 0 - 25 %  ? Lymphs, Fluid 73 %  ? Monocyte-Macrophage-Serous Fluid 6 (L) 50 - 90 %  ? Eos, Fluid 2 %  ?  Comment: Performed at [facility], [address],  [place], [place] [address]  ?Glucose, pleural or peritoneal fluid     Status: None  ? Collection Time: 08/29/21  2:53 PM  ?Result Value Ref Range  ? Glucose, Fluid 129 mg/dL  ?  Comment: (NOTE) ?No normal range established for t

## 2021-08-29 NOTE — Progress Notes (Signed)
?  NEUROSURGERY PROGRESS NOTE  ? ?No issues overnight. Pt reports significantly improved back and leg pain today compared to preop. ? ?EXAM:  ?BP 117/71 (BP Location: Left Arm)   Pulse 97   Temp 98.4 ?F (36.9 ?C) (Oral)   Resp 17   Ht 5\' 7"  (1.702 m)   Wt 86.4 kg   LMP  (LMP Unknown) Comment: pregnancy test neg.  SpO2 97%   BMI 29.83 kg/m?  ? ?Awake, alert, oriented  ?Speech fluent, appropriate  ?CN grossly intact  ?Good strength BLE ?Hemovac in place, 65cc overnight ? ?IMPRESSION:  ?36 y.o. female with multifocal spinal epidural abscess and MRSA bacteremia, POD#1 s/p L4-5 laminectomy debridement of abscess. Stable from neurosurgical standpoint ?Possible septic arthritis right shoulder ?Possible pulmonary septic emboli ? ?PLAN: ?- Will cont hemovac today ?- Cont IV abx ?- cont w/u and mgmt per primary service ? ? ?Consuella Lose, MD ?[facility]  ? ?

## 2021-08-29 NOTE — Progress Notes (Signed)
Discussed CXR / KUB findings with Dr. Janee Morn.  Called patient to review her CXR / KUB findings.  Explained to patient that she has air under the diaphragm which is abnormal. She reports she developed acute abdominal pain approximately 2 hours ago and nausea.  Discussed CT abd/pelvis order and made patient NPO.   ? ? ?Canary Brim, MSN, APRN, NP-C, AGACNP-BC ?[facility] ?08/29/2021, 6:08 PM ? ? ?Please see [email or web address] for pager details.  ? ?From 7A-7P if no response, please call [phone] ?After hours, please call ELink [phone] ? ?

## 2021-08-29 NOTE — Progress Notes (Signed)
?PROGRESS NOTE ? ?Kimberly PotashDiana G Booker  ZOX:[id number] DOB: 10-Jun-1985 DOA: 08/26/2021 ?PCP: Pcp, No  ? ?Brief Narrative: ?36 year old with past medical history significant for anxiety, depression, suicidal attempt drug overdose, polysubstance abuse , right clavicle fracture 8 months ago, she reported having occasional pain but a week ago and  was [facility]  [facility]21 due to right shoulder abscess.She was treated with I&D in the emergency department followed by IV antibiotics therapy as an inpatient.  She was diagnosed with MRSA bacteremia ,endocarditis was ruled out with a TEE performed 3/16,but she continued to have positive blood cultures despite treatment with ceftriaxone and vancomycin.  Antibiotic therapy was changed to daptomycin.  [facility] was [facility]. ?Extensive imagings have been done here which showed right clavicle osteomyelitis/phlegmon/abscess, widespread cervical,thoracic,lumbar discitis/osteomyelitis/epidural abscess/cord impingement.  MRI also showed bilateral iliopsoas abscess, right complex pleural effusion.  ID, orthopedics, neurosurgery following.  Currently on daptomycin.  Plan for transthoracic echo.  Underwent lumbar laminectomy / debridement of abscess, L4-L5 on 3/25  ? ?Assessment & Plan: ? ?Principal Problem: ?  MRSA bacteremia ?Active Problems: ?  Abscess in epidural space of lumbar spine ?  Pathologic fracture of right clavicle ?  Anxiety with depression ?  Polysubstance abuse (HCC) ?  Protein-calorie malnutrition, severe (HCC) ?  Transaminitis ?  Normocytic anemia ?  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  Pleural effusion ?  Iliopsoas abscess (HCC) ? ? ?Assessment and Plan: ?* MRSA bacteremia ?-Patient likely has disseminated MRSA bacteremia.  ?-Continue abx as per ID,on dapto ?-Repeat blood cultures sent on 3/24 ?-She had TEE outside facility 3/16 negative for endocarditis.  ?-ID following, plan for TTE, most likely needs TEE also.   ? 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 Bilateral facet arthritis at L5-S1. Dorsal epidural abscess at L4 and L5,highly compressive on the thecal sac. ?S/P  lumbar laminectomy, debridement of abscess, L4-L5 on 3/25 ? ?Iliopsoas abscess (HCC) ?Pelvic MRI showed bilateral infectious ?sacroiliitis and osteomyelitis of the sacrum and periarticular right iliac bone. Bilateral psoas abscesses ,Small bilateral paraspinal muscle abscesses,intramuscular abscess within the left piriformis muscle ,small intramuscular abscess within the left gluteus medius muscle. ?Orthopedics to follow ? ? ?Pleural effusion ?Chest CT showed a large loculated right pleural effusion concerning for empyema, subcentimeter groundglass nodularity within the bilateral lungs concerning for septic emboli.  Will consult PCCM ? ?Abscess in epidural space of cervical spine ?Discitis/osteomyelitis at C2-3, C5-6, and C6-7. Ventral epidural abscess at C2-3, C5, and C6 with cord impingement especially at the lower 2 levels. Facet arthritis on the left at C7-T1 and right at C3-4. ?-Also showed discitis/osteomyelitis of T9- T10, T11-T12 ?-Neurosurgery following. ? ?Normocytic anemia ?Monitor hb.  Currently stable.  Received 2 units PRBC outside facility.  ? ?Protein-calorie malnutrition, severe (HCC) ?Nutrition is consulted ? ?Polysubstance abuse (HCC) ?She declined IV drug abuse recently, last drug use was 2 years ago as per her ? ?Anxiety with depression ?Continue with Prozac and Atarax.  ? ?Pathologic fracture of right clavicle ?MRI of the shoulder showed osteomyelitis, phlegmon, possible abscess.  Orthopedics following ? ? ? ? ?Nutrition Problem: Increased nutrient needs ?Etiology: acute illness, post-op healing, wound healing ?  ? ?DVT prophylaxis:Place and maintain sequential compression device Start: 08/28/21 1049 ?Lovenox ? ?  Code Status: Full Code ? ?Family  Communication: None at bedside ? ?Patient status: Inpatient ? ?Patient is from : Home ? ?Anticipated discharge to: Home ? ?  Estimated DC date: Suspect prolonged hospitalization ? ? ?Consultants: ID, neurosurgery, orthopedics ? ?Procedures:  laminectomy ? ?Antimicrobials:  ?Anti-infectives (From admission, onward)  ? ? Start     Dose/Rate Route Frequency Ordered Stop  ? 08/26/21 2000  cefTRIAXone (ROCEPHIN) 2 g in sodium chloride 0.9 % 100 mL IVPB  Status:  Discontinued       ? 2 g ?200 mL/hr over 30 Minutes Intravenous Every 24 hours 08/26/21 1614 08/28/21 1736  ? 08/26/21 2000  DAPTOmycin (CUBICIN) 700 mg in sodium chloride 0.9 % IVPB       ? 8 mg/kg ? 87.2 kg ?128 mL/hr over 30 Minutes Intravenous Daily 08/26/21 1819    ? ?  ? ? ?Subjective: ?Patient seen and examined at the bedside this morning.  She appears comfortable today.  Pain well controlled. ? ?Objective: ?Vitals:  ? 08/28/21 2009 08/29/21 0214 08/29/21 0509 08/29/21 0759  ?BP: 115/80 111/69 111/76 117/71  ?Pulse: 78  85 97  ?Resp: 20 18  17   ?Temp: 97.6 ?F (36.4 ?C) 98.7 ?F (37.1 ?C) 98.3 ?F (36.8 ?C) 98.4 ?F (36.9 ?C)  ?TempSrc: Oral Oral Oral Oral  ?SpO2: 100%  98% 97%  ?Weight:      ?Height:      ? ? ?Intake/Output Summary (Last 24 hours) at 08/29/2021 0810 ?Last data filed at 08/29/2021 0230 ?Gross per 24 hour  ?Intake 1000 ml  ?Output 2115 ml  ?Net -1115 ml  ? ?Filed Weights  ? 08/26/21 1733 08/28/21 0500 08/28/21 1059  ?Weight: 87.2 kg 86.4 kg 86.4 kg  ? ? ?Examination: ? ?General exam: Overall comfortable, not in distress,obese ?HEENT: PERRL ?Respiratory system:  no wheezes or crackles  ?Cardiovascular system: S1 & S2 heard, RRR.  ?Gastrointestinal system: Abdomen is nondistended, soft and nontender. ?Central nervous system: Alert and oriented ?Extremities: Draining wound on the right shoulder, wound VAC on the back, tenderness and edema of the right thigh ?Skin: No rashes, no ulcers,no icterus     ? ? ?Data Reviewed: I have personally reviewed  following labs and imaging studies ? ?CBC: ?Recent Labs  ?Lab 08/26/21 ?1714 08/27/21 ?1237 08/29/21 ?0442  ?WBC 13.8* 13.5* 15.8*  ?NEUTROABS  --   --  13.2*  ?HGB 9.4* 8.5* 8.7*  ?HCT 29.1* 26.4* 27.1*  ?MCV 96.0 95.7 95.1  ?PLT 642* 706* 646*  ? ?Basic Metabolic Panel: ?Recent Labs  ?Lab 08/26/21 ?1714 08/27/21 ?1237 08/29/21 ?0442  ?NA 134* 130* 134*  ?K 4.1 3.7 4.5  ?CL 103 100 101  ?CO2 26 25 24   ?GLUCOSE 95 92 168*  ?BUN 8 6 12   ?CREATININE 0.34* 0.32* 0.34*  ?CALCIUM 8.0* 7.4* 8.0*  ? ? ? ?Recent Results (from the past 240 hour(s))  ?Culture, blood (routine x 2)     Status: None (Preliminary result)  ? Collection Time: 08/27/21 12:39 PM  ? Specimen: BLOOD  ?Result Value Ref Range Status  ? Specimen Description   Final  ?  BLOOD BLOOD RIGHT FOREARM ?Performed at [facility], [address] [place], [place] ?  ? Special Requests   Final  ?  BOTTLES DRAWN AEROBIC ONLY Blood Culture results may not be optimal due to an inadequate volume of blood received in culture bottles ?Performed at [facility], [address], [place], [place] ?  ? Culture   Final  ?  NO GROWTH < 24 HOURS ?Performed at [facility], [address] [place], [facility] [address] ?  ?  Report Status PENDING  Incomplete  ?Culture, blood (routine x 2)     Status: None (Preliminary result)  ? Collection Time: 08/27/21 12:39 PM  ? Specimen: BLOOD  ?Result Value Ref Range Status  ? Specimen Description   Final  ?  BLOOD BLOOD RIGHT HAND ?Performed at [facility], [address], [place], [place] [address] ?  ? Special Requests   Final  ?  BOTTLES DRAWN AEROBIC ONLY Blood Culture adequate volume ?Performed at [facility], [address], [place], [place] [address] ?  ? Culture   Final  ?  NO GROWTH < 24 HOURS ?Performed at [facility], [address], [place], [place] [address] ?  ? Report Status PENDING  Incomplete  ?Aerobic/Anaerobic  Culture w Gram Stain (surgical/deep wound)     Status: None (Preliminary result)  ? Collection Time: 08/28/21  1:42 PM  ? Specimen: Wound; Abscess  ?Result Value Ref Range Status  ? Specimen Description WOUND  Final  ? Speci

## 2021-08-29 NOTE — Plan of Care (Signed)

## 2021-08-29 NOTE — Progress Notes (Signed)
Plan right shoulder debridement mon/tues with take back Friday with Dr Sharol Given for closure ?Prolonged right shoulder dysfunction is the expected outcome assuming the infection can be eradicated ?

## 2021-08-29 NOTE — Anesthesia Preprocedure Evaluation (Addendum)
Anesthesia Evaluation  ?Patient identified by MRN, date of birth, ID band ?Patient awake ? ? ? ?Reviewed: ?Allergy & Precautions, NPO status , Patient's Chart, lab work & pertinent test results ? ?Airway ?Mallampati: I ? ?TM Distance: >3 FB ?Neck ROM: Full ? ? ? Dental ? ?(+) Poor Dentition, Missing, Chipped,  ?  ?Pulmonary ?Current Smoker,  ?  ?breath sounds clear to auscultation ? ? ? ? ? ? Cardiovascular ?negative cardio ROS ? ? ?Rhythm:Regular Rate:Normal ? ?Echo: ?1. Left ventricular ejection fraction, by estimation, is 65 to 70%. The  ?left ventricle has normal function. The left ventricle has no regional  ?wall motion abnormalities. Left ventricular diastolic parameters were  ?normal.  ??2. Right ventricular systolic function is normal. The right ventricular  ?size is normal. There is normal pulmonary artery systolic pressure. The  ?estimated right ventricular systolic pressure is 99991111 mmHg.  ??3. The mitral valve is grossly normal. Trivial mitral valve  ?regurgitation.  ??4. The aortic valve is tricuspid. Aortic valve regurgitation is not  ?visualized.  ??5. The inferior vena cava is normal in size with greater than 50%  ?respiratory variability, suggesting right atrial pressure of 3 mmHg.  ?  ?Neuro/Psych ?PSYCHIATRIC DISORDERS Anxiety Depression   ? GI/Hepatic ?negative GI ROS, Neg liver ROS,   ?Endo/Other  ?negative endocrine ROS ? Renal/GU ?negative Renal ROS  ? ?  ?Musculoskeletal ?negative musculoskeletal ROS ?(+)  ? Abdominal ?Normal abdominal exam  (+)   ?Peds ? Hematology ?negative hematology ROS ?(+)   ?Anesthesia Other Findings ? ? Reproductive/Obstetrics ? ?  ? ? ? ? ? ? ? ? ? ? ? ? ? ?  ?  ? ? ? ? ? ? ? ?Anesthesia Physical ?Anesthesia Plan ? ?ASA: 2 and emergent ? ?Anesthesia Plan: General  ? ?Post-op Pain Management:   ? ?Induction: Intravenous ? ?PONV Risk Score and Plan: 3 and Ondansetron, Midazolam and Dexamethasone ? ?Airway Management Planned: Oral  ETT ? ?Additional Equipment: None ? ?Intra-op Plan:  ? ?Post-operative Plan: Extubation in OR ? ?Informed Consent: I have reviewed the patients History and Physical, chart, labs and discussed the procedure including the risks, benefits and alternatives for the proposed anesthesia with the patient or authorized representative who has indicated his/her understanding and acceptance.  ? ? ? ? ? ?Plan Discussed with: CRNA ? ?Anesthesia Plan Comments:   ? ? ? ? ? ?Anesthesia Quick Evaluation ? ?

## 2021-08-29 NOTE — Anesthesia Procedure Notes (Signed)
Procedure Name: Intubation ?Date/Time: 08/29/2021 10:47 PM ?Performed by: Trinna Post., CRNA ?Pre-anesthesia Checklist: Patient identified, Emergency Drugs available, Suction available, Patient being monitored and Timeout performed ?Patient Re-evaluated:Patient Re-evaluated prior to induction ?Oxygen Delivery Method: Circle system utilized ?Preoxygenation: Pre-oxygenation with 100% oxygen ?Induction Type: IV induction, Rapid sequence and Cricoid Pressure applied ?Laryngoscope Size: Mac and 3 ?Grade View: Grade I ?Tube type: Oral ?Tube size: 7.0 mm ?Number of attempts: 1 ?Airway Equipment and Method: Stylet ?Placement Confirmation: ETT inserted through vocal cords under direct vision, positive ETCO2 and breath sounds checked- equal and bilateral ?Secured at: 23 cm ?Tube secured with: Tape ?Dental Injury: Teeth and Oropharynx as per pre-operative assessment  ? ? ? ? ?

## 2021-08-29 NOTE — Progress Notes (Signed)
Pharmacy Antibiotic Note ? ?Kimberly Booker is a 36 y.o. female admitted on 08/26/2021 with  disseminated MRSA bacteremia (diffuse discitis/osteomyelitis/epidural abscess of cervical, thoracic and lumbar spine, bilateral septic arthritis, osteomyelitis of R clavicle, R lung empyema) .  ? ?Patient was initially started on vancomycin 3/13-3/15 along with ceftriaxone 3/13-3/24. Vancomycin was transitioned to daptomycin and given 3/16-3/25. Pt was transferred to [facility] on 08/26/21. ID has consulted pharmacy to discontinue daptomycin and to dose vancomycin for coverage of MRSA R lung empyema. ? ?Plan: ?Discontinue daptomycin, weekly CK and SCr levels ?Load vancomycin 2000mg  IV x 1, followed by ?Vancomycin 1250mg  IV q12h (eAUC ~485) ?Goal AUC 400-550 ?Check CBC and BMET daily ?Monitor renal function, WBC, temp, and clinical signs of infection daily.  ? ?Height: 5\' 7"  (170.2 cm) ?Weight: 86.4 kg (190 lb 7.6 oz) ?IBW/kg (Calculated) : 61.6 ? ?Temp (24hrs), Avg:98.4 ?F (36.9 ?C), Min:97.6 ?F (36.4 ?C), Max:99.7 ?F (37.6 ?C) ? ?Recent Labs  ?Lab 08/26/21 ?1714 08/27/21 ?1237 08/29/21 ?0442  ?WBC 13.8* 13.5* 15.8*  ?CREATININE 0.34* 0.32* 0.34*  ?  ?Estimated Creatinine Clearance: 109.7 mL/min (A) (by C-G formula based on SCr of 0.34 mg/dL (L)).   ? ?Allergies  ?Allergen Reactions  ? Amoxicillin Swelling  ?  Throat closes  ? ? ?Antimicrobials this admission: ?Vanc 3/13 >> 3/15 ?Ceftriaxone 3/13 >> 3/24 ?Daptomycin 3/16 >> 3/25 ?Vanc 3/26 >> ? ?Microbiology results: ?3/12 Bcx x2: 4/4 MRSA ?3/12 Chest wound cx: MRSA ?3/14 Bcx: 4/4 MRSA ?3/22 Bcx: ngtd x3d ?3/24 Bcx: ngtd x2d ?3/25 lumbar epidural wound/abscess: rare GPC ? ?Thank you for allowing pharmacy to be a part of this patientKimberlys care. ? ?Kaleen Mask ?08/29/2021 8:43 AM ? ?

## 2021-08-30 ENCOUNTER — Inpatient Hospital Stay (HOSPITAL_COMMUNITY): Payer: Medicaid Other

## 2021-08-30 ENCOUNTER — Encounter (HOSPITAL_COMMUNITY): Payer: Self-pay | Admitting: Surgery

## 2021-08-30 DIAGNOSIS — K255 Chronic or unspecified gastric ulcer with perforation: Secondary | ICD-10-CM

## 2021-08-30 DIAGNOSIS — M4656 Other infective spondylopathies, lumbar region: Secondary | ICD-10-CM

## 2021-08-30 LAB — HEPATITIS B SURFACE ANTIBODY, QUANTITATIVE: Hep B S AB Quant (Post): 263 m[IU]/mL (ref >9.9)

## 2021-08-30 LAB — SURGICAL PCR SCREEN
MRSA, PCR: POSITIVE — AB
Staphylococcus aureus: POSITIVE — AB

## 2021-08-30 LAB — PATHOLOGIST SMEAR REVIEW

## 2021-08-30 MED ORDER — LIDOCAINE 2% (20 MG/ML) 5 ML SYRINGE
INTRAMUSCULAR | Status: AC
  Filled 2021-08-30: qty 5

## 2021-08-30 MED ORDER — ONDANSETRON HCL 4 MG/2ML IJ SOLN
INTRAMUSCULAR | Status: AC
  Filled 2021-08-30: qty 2

## 2021-08-30 MED ORDER — SODIUM CHLORIDE 0.9 % IV SOLN: 2.0000 g | INTRAVENOUS | Status: DC

## 2021-08-30 MED ORDER — ONDANSETRON HCL 4 MG/2ML IJ SOLN
INTRAMUSCULAR | Status: DC | PRN
  Administered 2021-08-30: 4 mg via INTRAVENOUS

## 2021-08-30 MED ORDER — PANTOPRAZOLE 80MG IVPB - SIMPLE MED
80.0000 mg | Freq: Once | INTRAVENOUS | Status: AC
  Administered 2021-08-30: 80 mg via INTRAVENOUS
  Filled 2021-08-30: qty 80

## 2021-08-30 MED ORDER — AMISULPRIDE (ANTIEMETIC) 5 MG/2ML IV SOLN: 10.0000 mg | Freq: Once | INTRAVENOUS | Status: DC | PRN

## 2021-08-30 MED ORDER — HYDROMORPHONE HCL 1 MG/ML IJ SOLN
INTRAMUSCULAR | Status: AC
  Filled 2021-08-30: qty 1

## 2021-08-30 MED ORDER — METRONIDAZOLE 500 MG/100ML IV SOLN
500.0000 mg | Freq: Two times a day (BID) | INTRAVENOUS | Status: DC
  Administered 2021-08-30: 500 mg via INTRAVENOUS
  Filled 2021-08-30 (×2): qty 100

## 2021-08-30 MED ORDER — PHENOL 1.4 % MT LIQD
1.0000 | OROMUCOSAL | Status: DC | PRN
Start: 2021-08-30 — End: 2021-10-29

## 2021-08-30 MED ORDER — PANTOPRAZOLE SODIUM 40 MG IV SOLR
40.0000 mg | Freq: Two times a day (BID) | INTRAVENOUS | Status: DC
Start: 2021-08-30 — End: 2021-09-06
  Administered 2021-08-30 – 2021-09-06 (×15): 40 mg via INTRAVENOUS
  Filled 2021-08-30 (×15): qty 10

## 2021-08-30 MED ORDER — DEXAMETHASONE SODIUM PHOSPHATE 10 MG/ML IJ SOLN
INTRAMUSCULAR | Status: AC
  Filled 2021-08-30: qty 1

## 2021-08-30 MED ORDER — CIPROFLOXACIN IN D5W 400 MG/200ML IV SOLN
400.0000 mg | Freq: Two times a day (BID) | INTRAVENOUS | Status: DC
  Administered 2021-08-30 (×2): 400 mg via INTRAVENOUS
  Filled 2021-08-30 (×3): qty 200

## 2021-08-30 MED ORDER — PANTOPRAZOLE INFUSION (NEW) - SIMPLE MED
8.0000 mg/h | INTRAVENOUS | Status: DC
  Administered 2021-08-30: 8 mg/h via INTRAVENOUS
  Filled 2021-08-30: qty 80

## 2021-08-30 MED ORDER — CHLORHEXIDINE GLUCONATE CLOTH 2 % EX PADS: 6.0000 | MEDICATED_PAD | Freq: Once | CUTANEOUS | Status: AC

## 2021-08-30 MED ORDER — MEPERIDINE HCL 25 MG/ML IJ SOLN: 6.2500 mg | INTRAMUSCULAR | Status: DC | PRN

## 2021-08-30 MED ORDER — LACTATED RINGERS IV SOLN: INTRAVENOUS | Status: DC

## 2021-08-30 MED ORDER — MUPIROCIN 2 % EX OINT
1.0000 "application " | TOPICAL_OINTMENT | Freq: Two times a day (BID) | CUTANEOUS | Status: AC
  Administered 2021-08-30 – 2021-09-03 (×10): 1 via NASAL
  Filled 2021-08-30 (×3): qty 22

## 2021-08-30 MED ORDER — BUPIVACAINE HCL 0.25 % IJ SOLN
INTRAMUSCULAR | Status: DC | PRN
  Administered 2021-08-30: 8 mL

## 2021-08-30 MED ORDER — SODIUM CHLORIDE 0.9% FLUSH
10.0000 mL | Freq: Three times a day (TID) | INTRAVENOUS | Status: DC
  Administered 2021-08-30 – 2021-09-19 (×40): 10 mL

## 2021-08-30 MED ORDER — HYDROMORPHONE HCL 1 MG/ML IJ SOLN
0.5000 mg | INTRAMUSCULAR | Status: AC | PRN
  Administered 2021-08-30 (×2): 0.5 mg via INTRAVENOUS

## 2021-08-30 MED ORDER — METRONIDAZOLE 500 MG/100ML IV SOLN: 500.0000 mg | INTRAVENOUS | Status: DC

## 2021-08-30 MED ORDER — DIPHENHYDRAMINE HCL 50 MG/ML IJ SOLN
INTRAMUSCULAR | Status: AC
  Filled 2021-08-30: qty 1

## 2021-08-30 MED ORDER — METHOCARBAMOL 1000 MG/10ML IJ SOLN
1000.0000 mg | Freq: Three times a day (TID) | INTRAVENOUS | Status: DC
  Administered 2021-08-30 – 2021-09-08 (×27): 1000 mg via INTRAVENOUS
  Filled 2021-08-30: qty 10
  Filled 2021-08-30: qty 1000
  Filled 2021-08-30: qty 10
  Filled 2021-08-30: qty 1000
  Filled 2021-08-30: qty 10
  Filled 2021-08-30 (×2): qty 1000
  Filled 2021-08-30 (×2): qty 10
  Filled 2021-08-30 (×2): qty 1000
  Filled 2021-08-30 (×3): qty 10
  Filled 2021-08-30 (×3): qty 1000
  Filled 2021-08-30 (×2): qty 10
  Filled 2021-08-30 (×2): qty 1000
  Filled 2021-08-30: qty 10
  Filled 2021-08-30 (×3): qty 1000
  Filled 2021-08-30: qty 10
  Filled 2021-08-30: qty 1000
  Filled 2021-08-30 (×3): qty 10
  Filled 2021-08-30 (×2): qty 1000
  Filled 2021-08-30: qty 10
  Filled 2021-08-30: qty 1000
  Filled 2021-08-30: qty 10

## 2021-08-30 MED ORDER — ACETAMINOPHEN 160 MG/5ML PO SOLN: 325.0000 mg | Freq: Once | ORAL | Status: DC | PRN

## 2021-08-30 MED ORDER — ROCURONIUM BROMIDE 10 MG/ML (PF) SYRINGE
PREFILLED_SYRINGE | INTRAVENOUS | Status: AC
  Filled 2021-08-30: qty 10

## 2021-08-30 MED ORDER — PANTOPRAZOLE SODIUM 40 MG IV SOLR: 40.0000 mg | Freq: Two times a day (BID) | INTRAVENOUS | Status: DC

## 2021-08-30 MED ORDER — SUGAMMADEX SODIUM 200 MG/2ML IV SOLN
INTRAVENOUS | Status: DC | PRN
  Administered 2021-08-30 (×2): 100 mg via INTRAVENOUS

## 2021-08-30 MED ORDER — ACETAMINOPHEN 325 MG PO TABS: 325.0000 mg | ORAL_TABLET | Freq: Once | ORAL | Status: DC | PRN

## 2021-08-30 MED ORDER — HYDROMORPHONE HCL 1 MG/ML IJ SOLN
0.5000 mg | INTRAMUSCULAR | Status: DC | PRN
  Administered 2021-08-30 – 2021-08-31 (×16): 1 mg via INTRAVENOUS
  Filled 2021-08-30 (×16): qty 1

## 2021-08-30 MED ORDER — HYDROMORPHONE HCL 1 MG/ML IJ SOLN
0.2500 mg | INTRAMUSCULAR | Status: DC | PRN
  Administered 2021-08-30 (×4): 0.5 mg via INTRAVENOUS

## 2021-08-30 MED ORDER — FENTANYL CITRATE (PF) 250 MCG/5ML IJ SOLN
INTRAMUSCULAR | Status: AC
  Filled 2021-08-30: qty 5

## 2021-08-30 MED ORDER — SUCCINYLCHOLINE CHLORIDE 200 MG/10ML IV SOSY
PREFILLED_SYRINGE | INTRAVENOUS | Status: AC
  Filled 2021-08-30: qty 10

## 2021-08-30 MED ORDER — ACETAMINOPHEN 10 MG/ML IV SOLN
INTRAVENOUS | Status: AC
  Filled 2021-08-30: qty 100

## 2021-08-30 MED ORDER — ACETAMINOPHEN 10 MG/ML IV SOLN
1000.0000 mg | Freq: Once | INTRAVENOUS | Status: DC | PRN
  Administered 2021-08-30: 1000 mg via INTRAVENOUS

## 2021-08-30 MED ORDER — CHLORHEXIDINE GLUCONATE CLOTH 2 % EX PADS
6.0000 | MEDICATED_PAD | Freq: Every day | CUTANEOUS | Status: AC
  Administered 2021-08-30 – 2021-09-03 (×4): 6 via TOPICAL

## 2021-08-30 NOTE — Progress Notes (Addendum)
?   ? ? ? ? ?[facility]   ? ?Date of Admission:  08/26/2021    ? ?Total days of antibiotics 16? ?      ?Reason for Consult: MRSA    ?Referring Provider: EDP ?Primary Care Provider: unknown ? ?Assessment: ?Principal Problem: ?  MRSA bacteremia ?Active Problems: ?  Pathologic fracture of right clavicle ?  Anxiety with depression ?  Polysubstance abuse (HCC) ?  Protein-calorie malnutrition, severe (HCC) ?  Transaminitis ?  Normocytic anemia ?  Abscess in epidural space of lumbar spine ?  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  Pleural effusion ?  Iliopsoas abscess (HCC) ?  Gastric perforation (HCC) ? ?Plan: ?MRSA bacteremia? with difuse discitis, osteomyelitis/epidural abscess, bilateral septic arthritis, osteomyelitis of Rt clavicle, and possible lung empyema: ? Patient has history of IVDU with surgical PCR positive for MRSA PCR and BC on admission negative. There is evidence disseminated MRSA infection. IR placed a Rt pig tail catheter on 03/26 which shows an exudative effusion. Additionally had a lumbar epidural abscess s/p laminectomy and debridement at L4/L5. She is currently on Vancomycin, metro, and ciprofloxacin. Will continue broad spectrum AB in the setting perforated gastric ulcer. TTE without obvious vegetations. Patient symptoms concerning for septic emboli. Patient may have had aTEE in [place] but I do not have the records. This will need to be confirmed. Otherwise, she will need a TEE here.  ?- Cont Vanco, metro, and cipro ?- Will likely need at least 4 weeks of IV vanc ?- Will need TEE for endocarditis work up  ?- Will dose length of antibiotics depending on results.  ? ? ? Chlorhexidine Gluconate Cloth  6 each Topical Q0600  ? enoxaparin (LOVENOX) injection  40 mg Subcutaneous Q24H  ? FLUoxetine  40 mg Oral Daily  ? HYDROmorphone      ? HYDROmorphone      ? HYDROmorphone      ? lidocaine  1 patch Transdermal Q24H  ? mouth rinse  15 mL Mouth Rinse BID  ? mupirocin  ointment  1 application. Nasal BID  ? nicotine  21 mg Transdermal Daily  ? pantoprazole  40 mg Intravenous Q12H  ? sodium chloride flush  10 mL Other Q8H  ? ? ?HPI: Kimberly Booker is a 36 y.o. female anxiety, depression, polysubstance use disorder, chronic right clavicular fracture, hx of right MRSA shoulder abscess s/p I&D at [facility] on 08/15/21. Patient had positive BC for MRSA  and was treated with IV antibiotics. She had an echocardiogram (TEE?) on 03/16 without evidence of endocarditis. Patient was on ceftriaxone and vancomycin but switched to daptomycin at some point. She was ultimately transferred to [facility] for further evaluation and management. On arrivle, patient had extensive imaging showing evidence of wide-spread MRSA infection including multilevel discitis, osteomyelitis, and epidural abscess with the lumbar abscess s/p laminectomy by NS. She also has bilateral iliopsoas abscess and a right pleural effusion s/p pigtail cath placement. Her hospital course was complicated by exploratory laparotomy due to gastric ulcer with bowel perforation. She is currently on broad spectrum AB with vanc, metro, and cipro.  ? ? ?Review of Systems: ?Review of Systems  ?Constitutional:  Positive for diaphoresis and malaise/fatigue.  ?Gastrointestinal:  Positive for abdominal pain.  ?Musculoskeletal:  Positive for back pain. Negative for joint pain and myalgias.  ?Neurological:  Negative for sensory change and focal weakness.  ? ?Past Medical History:  ?Diagnosis Date  ? Anxiety with depression 08/26/2021  ?  Polysubstance abuse (HCC) 08/26/2021  ? Suicide attempt by drug overdose (HCC) 08/26/2021  ? ? ?Social History  ? ?Tobacco Use  ? Smoking status: Every Day  ?  Types: Cigarettes  ? Smokeless tobacco: Never  ?Substance Use Topics  ? Alcohol use: Not Currently  ? Drug use: Yes  ?  Types: Methamphetamines, Heroin  ? ? ?Family History  ?Problem Relation Age of Onset  ? Hypertension Other   ? ?Allergies  ?Allergen  Reactions  ? Amoxicillin Swelling  ?  Throat closes  ? ? ?OBJECTIVE: ?Blood pressure 117/82, pulse 79, temperature 98.1 ?F (36.7 ?C), temperature source Oral, resp. rate 14, height 5\' 7"  (1.702 m), weight 86.4 kg, SpO2 96 %. ? ?Physical Exam ?Constitutional:   ?   Appearance: She is ill-appearing.  ?HENT:  ?   Head: Normocephalic and atraumatic.  ?Eyes:  ?   Extraocular Movements: Extraocular movements intact.  ?Cardiovascular:  ?   Rate and Rhythm: Normal rate.  ?   Pulses: Normal pulses.  ?   Heart sounds: Normal heart sounds.  ?Pulmonary:  ?   Effort: Pulmonary effort is normal.  ?   Breath sounds: Normal breath sounds.  ?Abdominal:  ?   General: There is distension.  ?   Tenderness: There is abdominal tenderness.  ?Musculoskeletal:     ?   General: Normal range of motion.  ?Skin: ?   General: Skin is warm and dry.  ?Neurological:  ?   General: No focal deficit present.  ?   Mental Status: She is alert.  ? ? ?Lab Results ?Lab Results  ?Component Value Date  ? WBC 18.3 (H) 08/29/2021  ? HGB 8.7 (L) 08/29/2021  ? HCT 26.9 (L) 08/29/2021  ? MCV 95.1 08/29/2021  ? PLT 627 (H) 08/29/2021  ?  ?Lab Results  ?Component Value Date  ? CREATININE 0.34 (L) 08/29/2021  ? BUN 17 08/29/2021  ? NA 136 08/29/2021  ? K 4.1 08/29/2021  ? CL 104 08/29/2021  ? CO2 26 08/29/2021  ?  ?Lab Results  ?Component Value Date  ? ALT 107 (H) 08/26/2021  ? AST 76 (H) 08/26/2021  ? ALKPHOS 104 08/26/2021  ? BILITOT 0.6 08/26/2021  ?  ? ?Microbiology: ?Recent Results (from the past 240 hour(s))  ?Culture, blood (routine x 2)     Status: None (Preliminary result)  ? Collection Time: 08/27/21 12:39 PM  ? Specimen: BLOOD  ?Result Value Ref Range Status  ? Specimen Description   Final  ?  BLOOD BLOOD RIGHT FOREARM ?Performed at [facility], [address] [place], [place] [address] ?  ? Special Requests   Final  ?  BOTTLES DRAWN AEROBIC ONLY Blood Culture results may not be optimal due to an inadequate volume of blood received in  culture bottles ?Performed at [facility], [address] [place], [place] [address] ?  ? Culture   Final  ?  NO GROWTH 3 DAYS ?Performed at [facility], [address] [place], [place] [address] ?  ? Report Status PENDING  Incomplete  ?Culture, blood (routine x 2)     Status: None (Preliminary result)  ? Collection Time: 08/27/21 12:39 PM  ? Specimen: BLOOD  ?Result Value Ref Range Status  ? Specimen Description   Final  ?  BLOOD BLOOD RIGHT HAND ?Performed at [facility], [address] [place], [place] [address] ?  ? Special Requests   Final  ?  BOTTLES  DRAWN AEROBIC ONLY Blood Culture adequate volume ?Performed at [facility], [address] [place], [place] [address] ?  ? Culture   Final  ?  NO GROWTH 3 DAYS ?Performed at [facility], [address], [place], [place] [address] ?  ? Report Status PENDING  Incomplete  ?Aerobic/Anaerobic Culture w Gram Stain (surgical/deep wound)     Status: None (Preliminary result)  ? Collection Time: 08/28/21  1:42 PM  ? Specimen: Wound; Abscess  ?Result Value Ref Range Status  ? Specimen Description WOUND  Final  ? Special Requests LUMBAR EPIDURAL ABSCESS SPEC A  Final  ? Gram Stain   Final  ?  NO WBC SEEN ?RARE GRAM POSITIVE COCCI ?Performed at [facility], [address] [place], [place] [address] ?  ? Culture   Final  ?  FEW STAPHYLOCOCCUS AUREUS ?SUSCEPTIBILITIES TO FOLLOW ?NO ANAEROBES ISOLATED; CULTURE IN PROGRESS FOR 5 DAYS ?  ? Report Status PENDING  Incomplete  ?Body fluid culture w Gram Stain     Status: None (Preliminary result)  ? Collection Time: 08/29/21  2:54 PM  ? Specimen: Pleural Fluid  ?Result Value Ref Range Status  ? Specimen Description PLEURAL FLUID  Final  ? Special Requests RIGHT  Final  ? Gram Stain   Final  ?  NO SQUAMOUS EPITHELIAL CELLS SEEN ?FEW WBC SEEN ?NO ORGANISMS SEEN ?  ? Culture   Final  ?  NO GROWTH < 24 HOURS ?Performed at [facility], [address] [place], [place] [address] ?  ? Report Status PENDING  Incomplete  ?Surgical pcr screen     Status: Abnormal  ? Collection Time: 08/29/21  9:40 PM  ? Specimen: Nasal Mucosa; Nasal Swab  ?Result Val

## 2021-08-30 NOTE — Progress Notes (Signed)
?  NEUROSURGERY PROGRESS NOTE  ? ?Pt required ex-lap, repair of gastric perforated ulcer this am. Cont to report minimal back pain/leg pain. ? ?EXAM:  ?BP 117/82 (BP Location: Left Arm)   Pulse 79   Temp 98.1 ?F (36.7 ?C) (Oral)   Resp 14   Ht 5\' 7"  (1.702 m)   Wt 86.4 kg   LMP  (LMP Unknown) Comment: pregnancy test neg.  SpO2 96%   BMI 29.83 kg/m?  ? ?Awake, alert, oriented  ?Speech fluent, appropriate  ?CN grossly intact  ?5/5 BUE/BLE  ?Hemovac in place, 160cc x 24hrs ? ?IMPRESSION:  ?36 y.o. female POD#1 s/p L4-5 lami for SEA, neurologically stable ?Gastric perforated ulcer s/p repair ?MRSA bacteremia ?Pulmonary effusion possible empyema ?Septic arthritis ? ?PLAN: ?- Will d/c hemovac tomorrow ?- cont mgmt per primary service ? ? ?31, MD ?[facility]  ? ?

## 2021-08-30 NOTE — Progress Notes (Signed)
? ? ?  BRIEF OVERNIGHT PROGRESS REPORT ? ?SUBJECTIVE:Notified by general surgery that patient will be taken to OR emergently for diagnostic laparoscopy and likely exploratory laparotomy of possible perforated bowel. ? ?OBJECTIVE:on chart review the temperature was 36.8?C, the heart rate  76 beats/minute, the blood pressure 116/78 mm Hg, the respiratory rate 19 breaths/minute, and the oxygen saturation 100% on 2L. ? ?ASSESSMENT ?36 yo female admitted with MRSA bacteremia, shoulder infection and epidural abscess, s/p lumbar laminectomy and drainage,  and apparent emboli to lungs.  Complicated by right pleural effusion s/p thoracentesis with chest tube placement. Hospital course further complicated by gastric perforation noted chest xray and follow up CT Abd/pelvis. ? ?PLAN ?Bowel perforation ?CT abdomen pelvis shows with free air and fluid in her abdomen consistent with gastric perforation ?S/p ex lap POD # 0 ?-Keep NPO  ?-NG tube to intermittent suction ?-Follow cultures, trend Lactic and  PCT ?-Monitor WBC/ Fever curve ?-Continue current IV antibiotics:  ?-IVF hydration as needed ?-Wound care per general surgery ?-General surgery following ? ? ? ? ?Webb Silversmith, DNP, CCRN, FNP-C, AGACNP-BC ?Acute Care Nurse Practitioner  ?[facility] ?Pager: [phone] ?[place] [facility] ? ?

## 2021-08-30 NOTE — Progress Notes (Signed)
Pacu RN Progress Note ? ?Patient's Father would like a call with an update on his daughter tomorrow-Monday please. Called 5N Secretary, she will relay message to [facility] RN to pass along to day shift.  ?

## 2021-08-30 NOTE — Assessment & Plan Note (Addendum)
Complained of abdominal pain on 3/26.  Chest x-ray done after chest tube placement showed incidental finding of pneumoperitoneum.  CT abdomen/pelvis confirmed pneumoperitoneum.  General surgery consulted and she underwent emergent exploratory laparatomy with finding of gastric perforation.  ?General surgery continues to follow.  Diet being advanced gradually.  Possible removal of drain today. ?

## 2021-08-30 NOTE — Progress Notes (Signed)
Patient had Indwelling foley placed in pre-op. Per RN handoff, foley to be maintained during 24hr post-op period. Nolberto Hanlon RN requested foley order to be placed. Incorrect time for foley order was put in (ordered for 1100 placement instead of 2300 when foley placed), order was then discontinued and attempted to be re-ordered by PACU RN. Order could not be reconciled from pacu after patient returned to unit due to patient location. Jaquail Mclees RN placed order to maintain foley per previous orders.  ?

## 2021-08-30 NOTE — Anesthesia Postprocedure Evaluation (Signed)
Anesthesia Post Note ? ?Patient: Kimberly Booker ? ?Procedure(s) Performed: LAPAROSCOPY DIAGNOSTIC, EXPLORATORY LAPAROTOMY; REPAIR OF GASTRIC ULCER (Abdomen) ?EXPLORATORY LAPAROTOMY (Abdomen) ? ?  ? ?Patient location during evaluation: PACU ?Anesthesia Type: General ?Level of consciousness: awake and alert ?Pain management: pain level controlled ?Vital Signs Assessment: post-procedure vital signs reviewed and stable ?Respiratory status: spontaneous breathing, nonlabored ventilation, respiratory function stable and patient connected to nasal cannula oxygen ?Cardiovascular status: blood pressure returned to baseline and stable ?Postop Assessment: no apparent nausea or vomiting ?Anesthetic complications: no ? ? ?No notable events documented. ? ?Last Vitals:  ?Vitals:  ? 08/30/21 0155 08/30/21 0215  ?BP: 122/83 125/87  ?Pulse: 86 84  ?Resp: 18 15  ?Temp: 36.8 ?C   ?SpO2: 95% 95%  ?  ?Last Pain:  ?Vitals:  ? 08/30/21 0240  ?TempSrc:   ?PainSc: 10-Worst pain ever  ? ? ?  ?  ?  ?  ?  ?  ? ?Effie Berkshire ? ? ? ? ?

## 2021-08-30 NOTE — Op Note (Signed)
Date: 08/30/21 ? ?Patient: Kimberly Booker ?MRN: [id number] ? ?Preoperative Diagnosis: Pneumoperitoneum ?Postoperative Diagnosis: Perforated gastric ulcer ? ?Procedure: Diagnostic laparoscopy, exploratory laparotomy, Graham patch repair of gastric perforation ? ?Surgeon: Sophronia Simas, MD ? ?EBL: Minimal ? ?Anesthesia: General endotracheal ? ?Specimens: Gastric ulcer biopsy ? ?Indications: [name] is a 36 year old female who was recently admitted with MRSA bacteremia, and an epidural abscess, which she recently underwent drainage of.  Postoperatively she developed a large right pleural effusion and underwent chest tube placement.  A postprocedure chest x-ray showed pneumoperitoneum, which was confirmed on a CT scan.  The patient has been having abdominal pain.  Emergent operative exploration was recommended and the patient agreed to proceed. ? ?Findings: Small 2 mm perforation on the distal posterior body of the stomach, repaired with a Cheree Ditto patch. ? ?Procedure details: Informed consent was obtained in the preoperative area prior to the procedure. The patient was brought to the operating room and placed on the table in the supine position.  General anesthesia was induced and appropriate lines and drains were placed for intraoperative monitoring. Perioperative antibiotics were administered per SCIP guidelines. The abdomen was prepped and draped in the usual sterile fashion. A pre-procedure timeout was taken verifying patient identity, surgical site and procedure to be performed. ? ?A small supraumbilical skin incision was made, the subcutaneous tissue was spread, and the umbilical stalk was grasped and elevated.  Veress needle was inserted through the fascia and intraperitoneal placement was confirmed with the saline drop test.  The abdomen was insufflated and a 5 mm port was placed.  Additional 5 mm ports were placed in the left upper quadrant and left lower quadrant, both under direct visualization.  There was  turbid fluid noted around the liver and in the pelvis.  The stomach was examined and did not have any defects on the anterior stomach.  The liver was gently elevated to expose the proximal duodenum, which was grossly normal in appearance with no signs of perforation.  The small bowel was run and there were no signs of perforation.  The colon was grossly normal in appearance.  At this point the decision was made to proceed with laparotomy to be able to open the lesser sac and to kocherize the duodenum.  The ports were removed and the abdomen was desufflated. ? ?An upper midline skin incision was made.  The subcutaneous tissue was divided with cautery, and the fascia was opened along the linea alba.  The peritoneum was opened, and the falciform ligament was ligated with 2-0 silk ties and divided.  An Allexis wound protector and Bookwalter fix retractor were placed.  The anterior stomach was carefully examined and did not have any sign of perforation.  The duodenum was partially kocherized and the entire C-loop was visualized.  It was normal and healthy in appearance with no signs of perforation.  The small bowel was run from the ligament of Treitz to the terminal ileum and was healthy with no perforation.  The ascending colon, transverse colon, descending colon, and sigmoid colon were examined and had no signs of injury.  The gastrocolic omentum was then opened to enter the lesser sac.  There was no fluid in the lesser sac.  The posterior stomach was examined carefully.  A very small perforation approximately 2 mm in diameter was identified on the distal body near the greater curve of the posterior stomach.  A small sample from the edge of the perforation was excised sharply and sent for routine pathology.  The surrounding tissue was soft and healthy in appearance with no palpable mass.  The defect was closed with simple interrupted 3-0 Vicryl sutures.  A second layer of 3-0 silk Lembert sutures was placed.  A tongue  of omentum was mobilized and laid over the repair, and the tails of the 3-0 silk sutures were loosely tied over the omentum to secured in place.  A 19 [place] JP drain was placed in the lesser sac adjacent to the gastric repair and brought out through the left lower quadrant port site.  It was secured to the skin with 2-0 nylon suture.  The abdomen was irrigated with warm saline and appeared hemostatic.  The retractors and wound protector were removed.  The fascia was closed at midline with a running looped 1 PDS suture.  The skin was very loosely closed with staples and a dry gauze dressing was placed. ? ?The patient tolerated the procedure well with no apparent complications.  All counts were correct x2 at the end of the procedure. The patient was extubated and taken to PACU in stable condition. ? ?Sophronia Simas, MD ?08/30/21 ?4:00 AM ? ? ?

## 2021-08-30 NOTE — Progress Notes (Addendum)
?PROGRESS NOTE ? ?Kimberly Booker  IYM:[id number] DOB: 05/10/1986 DOA: 08/26/2021 ?PCP: Pcp, No  ? ?Brief Narrative: ?36 year old with past medical history significant for anxiety, depression, suicidal attempt drug overdose, polysubstance abuse , right clavicle fracture 8 months ago, she reported having occasional pain but a week ago and  was admitted at [facility] on  08/15/2021 due to right shoulder abscess.She was treated with I&D in the emergency department followed by IV antibiotics therapy as an inpatient.  She was diagnosed with MRSA bacteremia ,endocarditis was ruled out with a TEE performed 3/16,but she continued to have positive blood cultures despite treatment with ceftriaxone and vancomycin.  Antibiotic therapy was changed to daptomycin.  She was then transferred to [facility]. ?Extensive imagings have been done here which showed right clavicle osteomyelitis/phlegmon/abscess, widespread cervical,thoracic,lumbar discitis/osteomyelitis/epidural abscess/cord impingement.  MRI also showed bilateral iliopsoas abscess, right complex pleural effusion.  ID, orthopedics, neurosurgery following.  Currently on daptomycin.  Plan for transthoracic echo.  Underwent lumbar laminectomy / debridement of abscess, L4-L5 on 3/25  ? ?Assessment & Plan: ? ?Principal Problem: ?  MRSA bacteremia ?Active Problems: ?  Abscess in epidural space of lumbar spine ?  Gastric perforation (HCC) ?  Pathologic fracture of right clavicle ?  Anxiety with depression ?  Polysubstance abuse (HCC) ?  Protein-calorie malnutrition, severe (HCC) ?  Transaminitis ?  Normocytic anemia ?  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  Pleural effusion ?  Iliopsoas abscess (HCC) ? ? ?Assessment and Plan: ?* MRSA bacteremia ?-Patient likely has disseminated MRSA bacteremia.  ?-Continue abx as per ID,on dapto ?-Repeat blood cultures sent on 3/24 ?-She had TEE outside facility 3/16 negative for endocarditis.  ?-ID following ?-TTE did not show  any vegetation, plan for TEE ? 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 Bilateral facet arthritis at L5-S1. Dorsal epidural abscess at L4 and L5,highly compressive on the thecal sac. ?S/P  lumbar laminectomy, debridement of abscess, L4-L5 on 3/25 ? ?Gastric perforation (HCC) ?Complaining of abdominal pain on 3/26.  Chest x-ray done after chest tube placement showed incidental finding of pneumoperitoneum.  CT abdomen/pelvis confirmed pneumoperitoneum.  General surgery consulted and she underwent emergent exploratory laparatomy with finding of gastric perforation.  Currently on NG tube.  General surgery following, recommending n.p.o.  Continue Protonix IV ? ?Iliopsoas abscess (HCC) ?Pelvic MRI showed bilateral infectious ?sacroiliitis and osteomyelitis of the sacrum and periarticular right iliac bone. Bilateral psoas abscesses ,Small bilateral paraspinal muscle abscesses,intramuscular abscess within the left piriformis muscle ,small intramuscular abscess within the left gluteus medius muscle. ?Orthopedics to follow ? ? ?Pleural effusion ?Chest CT showed a large loculated right pleural effusion concerning for empyema, subcentimeter groundglass nodularity within the bilateral lungs concerning for septic emboli.  S/P chest tube placement.  PCCM following ? ?Abscess in epidural space of cervical spine ?Discitis/osteomyelitis at C2-3, C5-6, and C6-7. Ventral epidural abscess at C2-3, C5, and C6 with cord impingement especially at the lower 2 levels. Facet arthritis on the left at C7-T1 and right at C3-4. ?-Also showed discitis/osteomyelitis of T9- T10, T11-T12 ?-Neurosurgery following. ? ?Normocytic anemia ?Monitor hb.  Currently stable.  Received 2 units PRBC outside facility.  ? ?Protein-calorie malnutrition, severe (HCC) ?Nutrition is consulted ? ?Polysubstance abuse (HCC) ?She declined IV drug abuse recently, last drug use was 2 years  ago as per her ? ?Anxiety with depression ?Continue with Prozac and Atarax.  ? ?Pathologic fracture of right clavicle ?MRI of the  shoulder showed osteomyelitis, phlegmon, possible abscess.  Orthopedics following ? ? ? ? ?Nutrition Problem: Increased nutrient needs ?Etiology: acute illness, post-op healing, wound healing ?  ? ?DVT prophylaxis:enoxaparin (LOVENOX) injection 40 mg Start: 08/29/21 1000 ?Place and maintain sequential compression device Start: 08/28/21 1049 ?Lovenox ? ?  Code Status: Full Code ? ?Family Communication: Called and discussed with father on phone on 3/27 ?Patient status: Inpatient ? ?Patient is from : Home ? ?Anticipated discharge to: Home ? ?Estimated DC date: Suspect prolonged hospitalization ? ? ?Consultants: ID, neurosurgery, orthopedics ? ?Procedures:  laminectomy ? ?Antimicrobials:  ?Anti-infectives (From admission, onward)  ? ? Start     Dose/Rate Route Frequency Ordered Stop  ? 08/30/21 1000  metroNIDAZOLE (FLAGYL) IVPB 500 mg       ? 500 mg ?100 mL/hr over 60 Minutes Intravenous Every 12 hours 08/30/21 0036 09/02/21 0959  ? 08/30/21 0600  metroNIDAZOLE (FLAGYL) IVPB 500 mg  Status:  Discontinued       ? 500 mg ?100 mL/hr over 60 Minutes Intravenous On call to O.R. 08/30/21 0220 08/30/21 0229  ? 08/30/21 0245  ciprofloxacin (CIPRO) IVPB 400 mg       ? 400 mg ?200 mL/hr over 60 Minutes Intravenous Every 12 hours 08/30/21 0037 09/01/21 2159  ? 08/30/21 0045  cefTRIAXone (ROCEPHIN) 2 g in sodium chloride 0.9 % 100 mL IVPB  Status:  Discontinued       ? 2 g ?200 mL/hr over 30 Minutes Intravenous Every 24 hours 08/30/21 0036 08/30/21 0036  ? 08/29/21 2315  metroNIDAZOLE (FLAGYL) IVPB 500 mg       ?Note to Pharmacy: TO MAIN OR STAT!! In surgery now  ? 500 mg ?100 mL/hr over 60 Minutes Intravenous To Surgery 08/29/21 2302 08/29/21 2319  ? 08/29/21 2200  vancomycin (VANCOREADY) IVPB 1250 mg/250 mL       ? 1,250 mg ?166.7 mL/hr over 90 Minutes Intravenous Every 12 hours 08/29/21 1857    ?  08/29/21 2100  Vancomycin (VANCOCIN) 1,250 mg in sodium chloride 0.9 % 250 mL IVPB  Status:  Discontinued       ? 1,250 mg ?166.7 mL/hr over 90 Minutes Intravenous Every 12 hours 08/29/21 0837 08/29/21 1858  ? 08/29/21 0930  vancomycin (VANCOCIN) 2,000 mg in sodium chloride 0.9 % 500 mL IVPB       ? 2,000 mg ?260 mL/hr over 120 Minutes Intravenous  Once 08/29/21 0837 08/29/21 1257  ? 08/26/21 2000  cefTRIAXone (ROCEPHIN) 2 g in sodium chloride 0.9 % 100 mL IVPB  Status:  Discontinued       ? 2 g ?200 mL/hr over 30 Minutes Intravenous Every 24 hours 08/26/21 1614 08/28/21 1736  ? 08/26/21 2000  DAPTOmycin (CUBICIN) 700 mg in sodium chloride 0.9 % IVPB  Status:  Discontinued       ? 8 mg/kg ? 87.2 kg ?128 mL/hr over 30 Minutes Intravenous Daily 08/26/21 1819 08/29/21 0842  ? ?  ? ? ?Subjective: ?Patient seen and examined at the bedside this morning.  She appears comfortable today.  Pain well controlled. ? ?Objective: ?Vitals:  ? 08/30/21 0155 08/30/21 0215 08/30/21 0250 08/30/21 4540  ?BP: 122/83 125/87  117/82  ?Pulse: 86 84 81 79  ?Resp: 18 15  14   ?Temp: 98.2 ?F (36.8 ?C) 98.3 ?F (36.8 ?C)  98.1 ?F (36.7 ?C)  ?TempSrc:  Oral  Oral  ?SpO2: 95% 95% 94% 96%  ?Weight:      ?Height:      ? ? ?  Intake/Output Summary (Last 24 hours) at 08/30/2021 1145 ?Last data filed at 08/30/2021 0700 ?Gross per 24 hour  ?Intake 2838.38 ml  ?Output 1842 ml  ?Net 996.38 ml  ? ?Filed Weights  ? 08/28/21 0500 08/28/21 1059 08/29/21 1417  ?Weight: 86.4 kg 86.4 kg 86.4 kg  ? ? ?Examination: ? ? ?General exam: Uncomfortable due to abdominal pain, obese ?HEENT: PERRL, NG tube ?Respiratory system:  no wheezes or crackles  ?Cardiovascular system: S1 & S2 heard, RRR.  ?Gastrointestinal system: abdominal dressing with a drain ?Central nervous system: Alert and oriented ?Extremities: Draining wound on the right shoulder, wound VAC on the back, tenderness/edema of the right thigh  ?skin: No rashes, no ulcers,no icterus   ? ?Data Reviewed: I have  personally reviewed following labs and imaging studies ? ?CBC: ?Recent Labs  ?Lab 08/26/21 ?1714 08/27/21 ?1237 08/29/21 ?0442 08/29/21 ?2153  ?WBC 13.8* 13.5* 15.8* 18.3*  ?NEUTROABS  --   --  13.2*  --   ?HGB 9.4

## 2021-08-30 NOTE — Progress Notes (Addendum)
Pacu RN Report to floor given ? ?Gave report to  [employer]. Room:5N23.  Discussed surgery, meds given in OR and Pacu, VS, IV fluids given, EBL, urine output, pain and other pertinent information. Also discussed if pt had any family or friends here or belongings with them.  ? ?Discussed that the NGT was pulled out accidentally when the CRNA was removing the ETT and did not want to put it back in without asking Dr Freida Busman. Unfortunately,Dr Freida Busman was in an emergent case and unavailable to ask and I told Marissa RN I would call her back when I spoke w/ Dr Freida Busman.  ? ?She does not need NGT to be placed at this time. If pt gets nauseous, call Dr Freida Busman and she will decide what to do.  ? ?Pt exits my care.   ?

## 2021-08-30 NOTE — Progress Notes (Signed)
Called Dr. Hart Rochester to obtain order for Dilaudid  1 mg IV for one dose. Pt is still experiencing a great deal of pain after receiving 2 mg IV Dilaudid. She has history of polysubstance abuse and Dr. Hart Rochester acknowledged that will will most likely not in fact be able to totally eradicate her pain but he did give orders for a one time dose for PACU. 08/30/2021 0125 Manson Allan, RN ?

## 2021-08-30 NOTE — Progress Notes (Signed)
Patient seen ?Events noted.  Patient had surgery essentially early this morning ?Plan to delay shoulder washout until Wednesday with Dr. Sharol Given ?Explained to the patient at length about the rationale for debridement and how her shoulder is going to be dysfunctional depending on how much bone is removed. ?All questions answered ?

## 2021-08-30 NOTE — Progress Notes (Signed)
[facility] ?Progress Note ? ?1 Day Post-Op  ?Subjective: ?CC: c/o abd pain  ?States she lives alone in [place]. Asking when NG tube can come out.  ? ?Objective: ?Vital signs in last 24 hours: ?Temp:  [97.5 ?F (36.4 ?C)-98.7 ?F (37.1 ?C)] 98.1 ?F (36.7 ?C) (03/27 [id number]) ?Pulse Rate:  [76-94] 79 (03/27 [id number]) ?Resp:  [14-22] 14 (03/27 [id number]) ?BP: (110-128)/(62-91) 117/82 (03/27 [id number]) ?SpO2:  [93 %-100 %] 96 % (03/27 [id number]) ?Weight:  [86.4 kg] 86.4 kg (03/26 1417) ?Last BM Date : 08/25/21 ? ?Intake/Output from previous day: ?03/26 0701 - 03/27 0700 ?In: 3078.4 [P.O.:720; I.V.:1428.9; IV Piggyback:929.5] ?Out: 1843 [Urine:703; Drains:160; Blood:200; Chest Tube:780] ?Intake/Output this shift: ?No intake/output data recorded. ? ?PE: ?Gen:  Alert, NAD, cooperative  ?Card:  Regular rate and rhythm ?Pulm:  Normal effort, R chest tube with serous and lightly bilious appearing drainage.  ?Abd: Soft, appropriately tender, midline incision w/ with staples - no cellulitis, some SS drainage, dry dressing replaced. LLQ drain SS - sponge change by me.  ? NG tube- hooked up incorrectly. Fixed by me. Blue port flushed with air, bilious/brown/clear fluid now draining.  ?Skin: warm and dry, no rashes  ?Psych: A&Ox3  ? ?Lab Results:  ?Recent Labs  ?  08/29/21 ?[id number] 08/29/21 ?2153  ?WBC 15.8* 18.3*  ?HGB 8.7* 8.7*  ?HCT 27.1* 26.9*  ?PLT 646* 627*  ? ?BMET ?Recent Labs  ?  08/29/21 ?0442 08/29/21 ?2153  ?NA 134* 136  ?K 4.5 4.1  ?CL 101 104  ?CO2 24 26  ?GLUCOSE 168* 105*  ?BUN 12 17  ?CREATININE 0.34* 0.34*  ?CALCIUM 8.0* 7.9*  ? ?PT/INR ?No results for input(s): LABPROT, INR in the last 72 hours. ?CMP  ?   ?Component Value Date/Time  ? NA 136 08/29/2021 2153  ? K 4.1 08/29/2021 2153  ? CL 104 08/29/2021 2153  ? CO2 26 08/29/2021 2153  ? GLUCOSE 105 (H) 08/29/2021 2153  ? BUN 17 08/29/2021 2153  ? CREATININE 0.34 (L) 08/29/2021 2153  ? CALCIUM 7.9 (L) 08/29/2021 2153  ? PROT 6.5 08/26/2021 1714  ? ALBUMIN 1.9 (L) 08/26/2021 1714  ?  AST 76 (H) 08/26/2021 1714  ? ALT 107 (H) 08/26/2021 1714  ? ALKPHOS 104 08/26/2021 1714  ? BILITOT 0.6 08/26/2021 1714  ? GFRNONAA >60 08/29/2021 2153  ? ?Lipase  ?No results found for: LIPASE ? ? ? ? ?Studies/Results: ?DG Lumbar Spine 2-3 Views ? ?Result Date: 08/28/2021 ?CLINICAL DATA:  Intraoperative localization EXAM: LUMBAR SPINE - 2-3 VIEW COMPARISON:  08/27/2021 FINDINGS: Two cross-table lateral intraoperative exam is are obtained. On the first image obtained at 1:14 p.m., radiopaque instrument is seen posterior to the L4 vertebral body. On the second image obtained at 1:20 p.m., the instrument is posterior to the L5 vertebral body. Irregularity along the L5-S1 disc space is noted, consistent with history of discitis/osteomyelitis. IMPRESSION: 1. Intraoperative localization films as above. Electronically Signed   By: Randa Ngo M.D.   On: 08/28/2021 16:17  ? ?CT ABDOMEN PELVIS W CONTRAST ? ?Result Date: 08/29/2021 ?CLINICAL DATA:  Pneumoperitoneum EXAM: CT ABDOMEN AND PELVIS WITH CONTRAST TECHNIQUE: Multidetector CT imaging of the abdomen and pelvis was performed using the standard protocol following bolus administration of intravenous contrast. RADIATION DOSE REDUCTION: This exam was performed according to the departmental dose-optimization program which includes automated exposure control, adjustment of the mA and/or kV according to patient size and/or use of iterative reconstruction technique. CONTRAST:  156mL OMNIPAQUE IOHEXOL 350 MG/ML SOLN  COMPARISON:  CT abdomen pelvis, 08/28/2021 FINDINGS: Lower chest: Small bilateral pleural effusions. Heterogeneous and ground-glass airspace disease of the dependent right lung base. Right-sided pigtail chest tube, partially imaged. Hepatobiliary: No solid liver abnormality is seen. Hepatomegaly, maximum coronal span 21.1 cm. No gallstones, gallbladder wall thickening, or biliary dilatation. Pancreas: Unremarkable. No pancreatic ductal dilatation or surrounding  inflammatory changes. Spleen: Mild splenomegaly, maximum span 14.1 cm. Adrenals/Urinary Tract: Adrenal glands are unremarkable. Kidneys are normal, without renal calculi, solid lesion, or hydronephrosis. Bladder is unremarkable. Stomach/Bowel: Stomach is within normal limits. Appendix appears normal. No evidence of bowel wall thickening, distention, or inflammatory changes. Vascular/Lymphatic: No significant vascular findings are present. No enlarged abdominal or pelvic lymph nodes. Reproductive: Status post hysterectomy. Other: Anasarca. Small volume pneumoperitoneum. Small volume ascites. Musculoskeletal: No acute or significant osseous findings. Status post L4 laminectomy. Epidural catheter. IMPRESSION: 1. Small volume pneumoperitoneum without apparent etiology. 2. Anasarca and small volume ascites. 3. Right-sided pigtail chest tube, partially imaged. Heterogeneous and ground-glass airspace disease of the dependent right lung base. 4. Hepatosplenomegaly. Electronically Signed   By: Delanna Ahmadi M.D.   On: 08/29/2021 20:25  ? ?CT CHEST ABDOMEN PELVIS W CONTRAST ? ?Result Date: 08/28/2021 ?CLINICAL DATA:  Disseminated MRSA infection, abdominal pain, L5-S1 discitis/osteomyelitis, psoas abscess EXAM: CT CHEST, ABDOMEN, AND PELVIS WITH CONTRAST TECHNIQUE: Multidetector CT imaging of the chest, abdomen and pelvis was performed following the standard protocol during bolus administration of intravenous contrast. RADIATION DOSE REDUCTION: This exam was performed according to the departmental dose-optimization program which includes automated exposure control, adjustment of the mA and/or kV according to patient size and/or use of iterative reconstruction technique. CONTRAST:  185mL OMNIPAQUE IOHEXOL 350 MG/ML SOLN COMPARISON:  08/27/2021, 08/28/2021 FINDINGS: CT CHEST FINDINGS Cardiovascular: The heart and great vessels are unremarkable without pericardial effusion. No evidence of thoracic aortic aneurysm or dissection.  Mediastinum/Nodes: No enlarged mediastinal, hilar, or axillary lymph nodes. Thyroid gland, trachea, and esophagus demonstrate no significant findings. Lungs/Pleura: Large partially loculated right pleural effusion. Trace free-flowing left pleural fluid. Dense right lower lobe consolidation favor atelectasis. There are multiple subcentimeter ground-glass nodule seen bilaterally, worrisome for septic emboli given the history of MRSA bacteremia. Largest nodule within the left upper lobe measures 0.9 cm. No evidence of cavitation at this time. Continued follow-up is recommended. No evidence of pneumothorax. Central airways are patent. Musculoskeletal: Destructive process within the right mid clavicle consistent with osteomyelitis. There is marked adjacent soft tissue swelling, as well as subcutaneous gas extending to the skin surface, consistent with open draining wound. There are no other acute displaced fractures. No other destructive bony lesions. The early discitis and osteomyelitis seen at T9-10 and T11-12 on prior MRI is not as apparent on this exam. CT ABDOMEN PELVIS FINDINGS Hepatobiliary: No focal liver abnormality is seen. No gallstones, gallbladder wall thickening, or biliary dilatation. Pancreas: Unremarkable. No pancreatic ductal dilatation or surrounding inflammatory changes. Spleen: Spleen is enlarged without focal abnormality. Adrenals/Urinary Tract: The kidneys enhance normally and symmetrically. No focal cortical abnormality. No urinary tract calculi or obstructive uropathy. The adrenals and bladder are unremarkable. Stomach/Bowel: No bowel obstruction or ileus. Distal colonic diverticulosis without diverticulitis. Normal retrocecal appendix. No bowel wall thickening. Vascular/Lymphatic: No significant vascular findings are present. No enlarged abdominal or pelvic lymph nodes. Reproductive: Status post hysterectomy. No adnexal masses. Other: Trace ascites within the abdomen and pelvis. No free  intraperitoneal gas. No abdominal wall hernia. Musculoskeletal: Multilocular bilateral psoas abscesses are seen, measuring up to 4.1 x 3.6 cm on the  right and 3.0 x 2.9 cm on the left. Endplate irregularity at the

## 2021-08-30 NOTE — Procedures (Signed)
Insertion of Chest Tube Procedure Note ? ?Kimberly Booker  ?[id number]  ?17-May-1986 ? ?Date:08/30/21  ?Time:7:39 AM  ? ? ?Provider Performing: B. Veleta Miners and Les Pou Kimberly Booker  ? ?Procedure: Pleural Catheter Insertion w/ Imaging Guidance (85277) ? ?Indication(s) ?Effusion ? ?Consent ?Risks of the procedure as well as the alternatives and risks of each were explained to the patient and/or caregiver.  Consent for the procedure was obtained and is signed in the bedside chart ? ?Anesthesia ?Topical only with 1% lidocaine  ? ? ?Time Out ?Verified patient identification, verified procedure, site/side was marked, verified correct patient position, special equipment/implants available, medications/allergies/relevant history reviewed, required imaging and test results available. ? ? ?Sterile Technique ?Maximal sterile technique including full sterile barrier drape, hand hygiene, sterile gown, sterile gloves, mask, hair covering, sterile ultrasound probe cover (if used). ? ? ?Procedure Description ?Ultrasound used to identify appropriate pleural anatomy for placement and overlying skin marked. Area of placement cleaned and draped in sterile fashion.  A 14 French pigtail pleural catheter was placed into the right pleural space using Seldinger technique. Appropriate return of fluid was obtained.  The tube was connected to atrium and placed on -20 cm H2O wall suction. ? ? ?Complications/Tolerance ?None; patient tolerated the procedure well. ?Chest X-ray is ordered to verify placement. ? ? ?EBL ?Minimal ? ?Specimen(s) ?fluid sent for standard labs ? ? ?Kimberly Pupa, MD, PhD ?08/30/2021, 7:41 AM ?[facility] ?[phone] or if no answer before 7:00PM call [phone] ?For any issues after 7:00PM please call eLink [phone] ? ?

## 2021-08-30 NOTE — Plan of Care (Signed)
  Problem: Coping: Goal: Level of anxiety will decrease Outcome: Progressing   Problem: Pain Managment: Goal: General experience of comfort will improve Outcome: Progressing   Problem: Safety: Goal: Ability to remain free from injury will improve Outcome: Progressing   Problem: Skin Integrity: Goal: Risk for impaired skin integrity will decrease Outcome: Progressing   

## 2021-08-30 NOTE — Transfer of Care (Signed)
Immediate Anesthesia Transfer of Care Note ? ?Patient: Kimberly Booker ? ?Procedure(s) Performed: LAPAROSCOPY DIAGNOSTIC, EXPLORATORY LAPAROTOMY; REPAIR OF GASTRIC ULCER (Abdomen) ?EXPLORATORY LAPAROTOMY (Abdomen) ? ?Patient Location: PACU ? ?Anesthesia Type:General ? ?Level of Consciousness: awake, alert  and oriented ? ?Airway & Oxygen Therapy: Patient Spontanous Breathing and Patient connected to face mask oxygen ? ?Post-op Assessment: Report given to RN and Post -op Vital signs reviewed and stable ? ?Post vital signs: Reviewed and stable ? ?Last Vitals:  ?Vitals Value Taken Time  ?BP 128/90 08/30/21 0106  ?Temp    ?Pulse 80 08/30/21 0120  ?Resp 22 08/30/21 0120  ?SpO2 99 % 08/30/21 0120  ?Vitals shown include unvalidated device data. ? ?Last Pain:  ?Vitals:  ? 08/29/21 2132  ?TempSrc:   ?PainSc: 7   ?   ? ?Patients Stated Pain Goal: 3 (08/29/21 0142) ? ?Complications: No notable events documented. ?

## 2021-08-31 ENCOUNTER — Inpatient Hospital Stay (HOSPITAL_COMMUNITY): Payer: Medicaid Other

## 2021-08-31 ENCOUNTER — Encounter (HOSPITAL_COMMUNITY)
Admission: AD | Disposition: A | Discharge status: Home / Self Care | Payer: Self-pay | Source: Other Acute Inpatient Hospital | Attending: Internal Medicine

## 2021-08-31 DIAGNOSIS — M86111 Other acute osteomyelitis, right shoulder: Secondary | ICD-10-CM

## 2021-08-31 DIAGNOSIS — E43 Unspecified severe protein-calorie malnutrition: Secondary | ICD-10-CM

## 2021-08-31 DIAGNOSIS — M869 Osteomyelitis, unspecified: Secondary | ICD-10-CM

## 2021-08-31 LAB — CBC
HCT: 27.6 % — ABNORMAL LOW (ref 36.0–46.0)
Hemoglobin: 8.7 g/dL — ABNORMAL LOW (ref 12.0–15.0)
MCH: 30.2 pg (ref 26.0–34.0)
MCHC: 31.5 g/dL (ref 30.0–36.0)
MCV: 95.8 fL (ref 80.0–100.0)
Platelets: 664 10*3/uL — ABNORMAL HIGH (ref 150–400)
RBC: 2.88 MIL/uL — ABNORMAL LOW (ref 3.87–5.11)
RDW: 14.6 % (ref 11.5–15.5)
WBC: 12.9 10*3/uL — ABNORMAL HIGH (ref 4.0–10.5)
nRBC: 0 % (ref 0.0–0.2)

## 2021-08-31 LAB — BASIC METABOLIC PANEL
Anion gap: 7 (ref 5–15)
BUN: 11 mg/dL (ref 6–20)
CO2: 25 mmol/L (ref 22–32)
Calcium: 8.1 mg/dL — ABNORMAL LOW (ref 8.9–10.3)
Chloride: 104 mmol/L (ref 98–111)
Creatinine, Ser: 0.39 mg/dL — ABNORMAL LOW (ref 0.44–1.00)
GFR, Estimated: >60 mL/min (ref >60)
Glucose, Bld: 92 mg/dL (ref 70–99)
Potassium: 3.9 mmol/L (ref 3.5–5.1)
Sodium: 136 mmol/L (ref 135–145)

## 2021-08-31 LAB — ACID FAST SMEAR (AFB, MYCOBACTERIA): Acid Fast Smear: NEGATIVE

## 2021-08-31 LAB — HCV RNA QUANT: HCV Quantitative: NOT DETECTED IU/mL (ref >50)

## 2021-08-31 LAB — H PYLORI, IGM, IGG, IGA AB
H Pylori IgG: 0.14 Index Value (ref 0.00–0.79)
H. Pylogi, Iga Abs: <9 units (ref 0.0–8.9)
H. Pylogi, Igm Abs: <9 units (ref 0.0–8.9)

## 2021-08-31 SURGERY — ECHOCARDIOGRAM, TRANSESOPHAGEAL
Anesthesia: Monitor Anesthesia Care

## 2021-08-31 MED ORDER — IOHEXOL 9 MG/ML PO SOLN
ORAL | Status: AC
  Filled 2021-08-31: qty 1000

## 2021-08-31 MED ORDER — IOHEXOL 300 MG/ML  SOLN
100.0000 mL | Freq: Once | INTRAMUSCULAR | Status: AC | PRN
  Administered 2021-08-31: 100 mL via INTRAVENOUS

## 2021-08-31 MED ORDER — ACETAMINOPHEN 10 MG/ML IV SOLN
1000.0000 mg | Freq: Four times a day (QID) | INTRAVENOUS | Status: AC | PRN
  Administered 2021-08-31: 1000 mg via INTRAVENOUS
  Filled 2021-08-31 (×2): qty 100

## 2021-08-31 MED ORDER — ALPRAZOLAM 0.5 MG PO TABS: 0.5000 mg | ORAL_TABLET | Freq: Three times a day (TID) | ORAL | Status: DC | PRN

## 2021-08-31 MED ORDER — LORAZEPAM 2 MG/ML IJ SOLN
0.5000 mg | Freq: Four times a day (QID) | INTRAMUSCULAR | Status: DC | PRN
  Administered 2021-08-31 – 2021-09-16 (×13): 0.5 mg via INTRAVENOUS
  Filled 2021-08-31 (×13): qty 1

## 2021-08-31 MED ORDER — HYDROMORPHONE HCL 1 MG/ML IJ SOLN
1.0000 mg | INTRAMUSCULAR | Status: DC | PRN
  Administered 2021-08-31 – 2021-09-05 (×40): 1 mg via INTRAVENOUS
  Filled 2021-08-31 (×41): qty 1

## 2021-08-31 MED ORDER — FENTANYL 25 MCG/HR TD PT72
1.0000 | MEDICATED_PATCH | TRANSDERMAL | Status: DC
  Administered 2021-08-31 – 2021-09-04 (×2): 1 via TRANSDERMAL
  Filled 2021-08-31 (×2): qty 1

## 2021-08-31 NOTE — Progress Notes (Addendum)
?   ? ? ? ? ?[facility]   ? ?Date of Admission:  08/26/2021    ? ?Total days of antibiotics 6+ (had antibiotics at previous hospitalization prior to arrival, ~17 days total) ?Vanco 03/26-continued ?Dapto 03/23-03/25 ?Cipro 03/26-03/27 ?Metro 03/36-03/27 ?Ceftriaxone 03/22-03/23 ?    ?Reason for Consult: MRSA    ?Referring Provider: EDP ?Primary Care Provider: unknown ? ?Assessment: ?Principal Problem: ?  MRSA bacteremia ?Active Problems: ?  Pathologic fracture of right clavicle ?  Anxiety with depression ?  Polysubstance abuse (HCC) ?  Protein-calorie malnutrition, severe (HCC) ?  Transaminitis ?  Normocytic anemia ?  Abscess in epidural space of lumbar spine ?  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  Pleural effusion ?  Iliopsoas abscess (HCC) ?  Gastric perforation (HCC) ? ?Plan: ?MRSA bacteremia with discitis, osteomyelitis/epidural abscess, osteomyelitis of Rt clavicle, bilateral iliopsoas abscess: ? 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 BC repeated at [facility] are negative. She is s/p laminectomy and debridement of L4/L5 and continues to have some lower back pain. Patient continues to have some abdominal pain after her recent exploratory laparotomy, which could be from her operation. However, she does have fairly large iliopsoas abscess from MRI that could be contributing. Patient has evidence of right shoulder osteomyelitis being treated by othro with plan for operative debridement on 03/31. She is on day 3 of vancomycin and will remain on AB for at least 6 weeks due to her epidural abscess. TEE was performed at a previous hospitalization, waiting on records.  ?- Cont Vanco for at least 8 weeks ?- IR consulted for iliopsoas abscess ?- No a candidate for PICC line with home antibiotics.  ? ?2. Gastric Ulcer complicated by pneumoperitoneum s/p ex lap. ?- Management per surgery  ? ?3. HCV AB positive  ?- Will need  HCV RNA  ? ? Chlorhexidine Gluconate Cloth  6 each Topical Q0600  ? enoxaparin (LOVENOX) injection  40 mg Subcutaneous Q24H  ? FLUoxetine  40 mg Oral Daily  ? lidocaine  1 patch Transdermal Q24H  ? mouth rinse  15 mL Mouth Rinse BID  ? mupirocin ointment  1 application. Nasal BID  ? nicotine  21 mg Transdermal Daily  ? pantoprazole  40 mg Intravenous Q12H  ? sodium chloride flush  10 mL Other Q8H  ? ? ?HPI: Kimberly Booker is a 36 y.o. female anxiety, depression, polysubstance use disorder, chronic right clavicular fracture, hx of right MRSA shoulder abscess s/p I&D at [facility] on 08/15/21. Patient had positive BC for MRSA  and was treated with IV antibiotics. She had an echocardiogram (TEE?) on 03/16 without evidence of endocarditis. Patient was on ceftriaxone and vancomycin but switched to daptomycin at some point. She was ultimately transferred to [facility] for further evaluation and management. On arrivle, patient had extensive imaging showing evidence of wide-spread MRSA infection including multilevel discitis, osteomyelitis, and epidural abscess with the lumbar abscess s/p laminectomy by NS. She also has bilateral iliopsoas abscess and a right pleural effusion s/p pigtail cath placement. Her hospital course was complicated by exploratory laparotomy due to gastric ulcer with bowel perforation. She is currently on broad vancomycin.  ? ? ?Review of Systems: ?ROS - negative except what is noted in assessment and plan. ? ?Past Medical History:  ?Diagnosis Date  ? Anxiety with depression 08/26/2021  ? Polysubstance abuse (HCC) 08/26/2021  ? Suicide attempt by drug  overdose (HCC) 08/26/2021  ? ? ?Social History  ? ?Tobacco Use  ? Smoking status: Every Day  ?  Types: Cigarettes  ? Smokeless tobacco: Never  ?Substance Use Topics  ? Alcohol use: Not Currently  ? Drug use: Yes  ?  Types: Methamphetamines, Heroin  ? ? ?Family History  ?Problem Relation Age of Onset  ? Hypertension Other   ? ?Allergies  ?Allergen  Reactions  ? Amoxicillin Swelling  ?  Throat closes  ? ? ?OBJECTIVE: ?Blood pressure 129/89, pulse 78, temperature 98.5 ?F (36.9 ?C), temperature source Oral, resp. rate 13, height 5\' 7"  (1.702 m), weight 86.4 kg, SpO2 96 %. ? ?Physical Exam ?Constitutional:   ?   Appearance: She is ill-appearing.  ?HENT:  ?   Head: Normocephalic and atraumatic.  ?Eyes:  ?   Extraocular Movements: Extraocular movements intact.  ?Cardiovascular:  ?   Rate and Rhythm: Normal rate.  ?   Pulses: Normal pulses.  ?   Heart sounds: Normal heart sounds.  ?Pulmonary:  ?   Effort: Pulmonary effort is normal.  ?   Breath sounds: Normal breath sounds.  ?Abdominal:  ?   General: There is distension.  ?   Tenderness: There is abdominal tenderness.  ?Musculoskeletal:     ?   General: Normal range of motion.  ?   Cervical back: Normal range of motion.  ?Skin: ?   General: Skin is warm and dry.  ?Neurological:  ?   General: No focal deficit present.  ?   Mental Status: She is alert.  ? ? ?Lab Results ?Lab Results  ?Component Value Date  ? WBC 12.9 (H) 08/31/2021  ? HGB 8.7 (L) 08/31/2021  ? HCT 27.6 (L) 08/31/2021  ? MCV 95.8 08/31/2021  ? PLT 664 (H) 08/31/2021  ?  ?Lab Results  ?Component Value Date  ? CREATININE 0.39 (L) 08/31/2021  ? BUN 11 08/31/2021  ? NA 136 08/31/2021  ? K 3.9 08/31/2021  ? CL 104 08/31/2021  ? CO2 25 08/31/2021  ?  ?Lab Results  ?Component Value Date  ? ALT 107 (H) 08/26/2021  ? AST 76 (H) 08/26/2021  ? ALKPHOS 104 08/26/2021  ? BILITOT 0.6 08/26/2021  ?  ? ?Microbiology: ?Recent Results (from the past 240 hour(s))  ?Culture, blood (routine x 2)     Status: None (Preliminary result)  ? Collection Time: 08/27/21 12:39 PM  ? Specimen: BLOOD  ?Result Value Ref Range Status  ? Specimen Description   Final  ?  BLOOD BLOOD RIGHT FOREARM ?Performed at [facility], [address], [place], [place] ?  ? Special Requests   Final  ?  BOTTLES DRAWN AEROBIC ONLY Blood Culture results may not be optimal due  to an inadequate volume of blood received in culture bottles ?Performed at [facility], [address], [place], [place] ?  ? Culture   Final  ?  NO GROWTH 4 DAYS ?Performed at [facility], [address] [place], [place] ?  ? Report Status PENDING  Incomplete  ?Culture, blood (routine x 2)     Status: None (Preliminary result)  ? Collection Time: 08/27/21 12:39 PM  ? Specimen: BLOOD  ?Result Value Ref Range Status  ? Specimen Description   Final  ?  BLOOD BLOOD RIGHT HAND ?Performed at [facility], [address], [place], [place] ?  ? Special Requests   Final  ?  BOTTLES  DRAWN AEROBIC ONLY Blood Culture adequate volume ?Performed at [facility], [address] [place], [place] [address] ?  ? Culture   Final  ?  NO GROWTH 4 DAYS ?Performed at [facility], [address] [place], [place] [address] ?  ? Report Status PENDING  Incomplete  ?Aerobic/Anaerobic Culture w Gram Stain (surgical/deep wound)     Status: None (Preliminary result)  ? Collection Time: 08/28/21  1:42 PM  ? Specimen: Wound; Abscess  ?Result Value Ref Range Status  ? Specimen Description WOUND  Final  ? Special Requests LUMBAR EPIDURAL ABSCESS SPEC A  Final  ? Gram Stain   Final  ?  NO WBC SEEN ?RARE GRAM POSITIVE COCCI ?Performed at [facility], [address] [place], [place] [address] ?  ? Culture   Final  ?  FEW METHICILLIN RESISTANT STAPHYLOCOCCUS AUREUS ?NO ANAEROBES ISOLATED; CULTURE IN PROGRESS FOR 5 DAYS ?  ? Report Status PENDING  Incomplete  ? Organism ID, Bacteria METHICILLIN RESISTANT STAPHYLOCOCCUS AUREUS  Final  ?    Susceptibility  ? Methicillin resistant staphylococcus aureus - MIC*  ?  CIPROFLOXACIN >=8 RESISTANT Resistant   ?  ERYTHROMYCIN >=8 RESISTANT Resistant   ?  GENTAMICIN <=0.5 SENSITIVE Sensitive   ?  OXACILLIN >=4 RESISTANT Resistant   ?  TETRACYCLINE <=1 SENSITIVE Sensitive   ?  VANCOMYCIN 1 SENSITIVE  Sensitive   ?  TRIMETH/SULFA >=320 RESISTANT Resistant   ?  CLINDAMYCIN >=8 RESISTANT Resistant   ?  RIFAMPIN <=0.5 SENSITIVE Sensitive   ?  Inducible Clindamycin NEGATIVE Sensitive   ?  * FEW METHICILLIN RESISTANT

## 2021-08-31 NOTE — Progress Notes (Signed)
[facility] ?Progress Note ? ?2 Days Post-Op  ?Subjective: ?No new complaints.  Hasn't  been out of bed.  Wanting to drink some.   ? ?Objective: ?Vital signs in last 24 hours: ?Temp:  [98.1 ?F (36.7 ?C)-98.5 ?F (36.9 ?C)] 98.5 ?F (36.9 ?C) (03/28 0802) ?Pulse Rate:  [73-84] 78 (03/28 0802) ?Resp:  [13-18] 13 (03/28 0802) ?BP: (128-133)/(86-89) 129/89 (03/28 0802) ?SpO2:  [95 %-99 %] 96 % (03/28 0802) ?Last BM Date : 08/25/21 ? ?Intake/Output from previous day: ?03/27 0701 - 03/28 0700 ?In: 0  ?Out: 3525 [Urine:2300; Emesis/NG output:200; Drains:900; Chest Tube:125] ?Intake/Output this shift: ?Total I/O ?In: -  ?Out: 450 [Urine:450] ? ?PE: ?Abd: Soft, appropriately tender, midline incision w/ with staples - no cellulitis, some SS drainage, dry dressing replaced. LLQ drain SS - sponge change by me.  ?NG tube with bilious fluid draining.  ? ? ?Lab Results:  ?Recent Labs  ?  08/29/21 ?2153 08/31/21 ?0246  ?WBC 18.3* 12.9*  ?HGB 8.7* 8.7*  ?HCT 26.9* 27.6*  ?PLT 627* 664*  ? ?BMET ?Recent Labs  ?  08/29/21 ?2153 08/31/21 ?0246  ?NA 136 136  ?K 4.1 3.9  ?CL 104 104  ?CO2 26 25  ?GLUCOSE 105* 92  ?BUN 17 11  ?CREATININE 0.34* 0.39*  ?CALCIUM 7.9* 8.1*  ? ?PT/INR ?No results for input(s): LABPROT, INR in the last 72 hours. ?CMP  ?   ?Component Value Date/Time  ? NA 136 08/31/2021 0246  ? K 3.9 08/31/2021 0246  ? CL 104 08/31/2021 0246  ? CO2 25 08/31/2021 0246  ? GLUCOSE 92 08/31/2021 0246  ? BUN 11 08/31/2021 0246  ? CREATININE 0.39 (L) 08/31/2021 0246  ? CALCIUM 8.1 (L) 08/31/2021 0246  ? PROT 6.5 08/26/2021 1714  ? ALBUMIN 1.9 (L) 08/26/2021 1714  ? AST 76 (H) 08/26/2021 1714  ? ALT 107 (H) 08/26/2021 1714  ? ALKPHOS 104 08/26/2021 1714  ? BILITOT 0.6 08/26/2021 1714  ? GFRNONAA >60 08/31/2021 0246  ? ?Lipase  ?No results found for: LIPASE ? ? ? ? ?Studies/Results: ?CT ABDOMEN PELVIS W CONTRAST ? ?Result Date: 08/29/2021 ?CLINICAL DATA:  Pneumoperitoneum EXAM: CT ABDOMEN AND PELVIS WITH CONTRAST TECHNIQUE:  Multidetector CT imaging of the abdomen and pelvis was performed using the standard protocol following bolus administration of intravenous contrast. RADIATION DOSE REDUCTION: This exam was performed according to the departmental dose-optimization program which includes automated exposure control, adjustment of the mA and/or kV according to patient size and/or use of iterative reconstruction technique. CONTRAST:  OMNIPAQUE IOHEXOL 350 MG/ML SOLN COMPARISON:  CT abdomen pelvis, 08/28/2021 FINDINGS: Lower chest: Small bilateral pleural effusions. Heterogeneous and ground-glass airspace disease of the dependent right lung base. Right-sided pigtail chest tube, partially imaged. Hepatobiliary: No solid liver abnormality is seen. Hepatomegaly, maximum coronal span 21.1 cm. No gallstones, gallbladder wall thickening, or biliary dilatation. Pancreas: Unremarkable. No pancreatic ductal dilatation or surrounding inflammatory changes. Spleen: Mild splenomegaly, maximum span 14.1 cm. Adrenals/Urinary Tract: Adrenal glands are unremarkable. Kidneys are normal, without renal calculi, solid lesion, or hydronephrosis. Bladder is unremarkable. Stomach/Bowel: Stomach is within normal limits. Appendix appears normal. No evidence of bowel wall thickening, distention, or inflammatory changes. Vascular/Lymphatic: No significant vascular findings are present. No enlarged abdominal or pelvic lymph nodes. Reproductive: Status post hysterectomy. Other: Anasarca. Small volume pneumoperitoneum. Small volume ascites. Musculoskeletal: No acute or significant osseous findings. Status post L4 laminectomy. Epidural catheter. IMPRESSION: 1. Small volume pneumoperitoneum without apparent etiology. 2. Anasarca and small volume ascites. 3.  Right-sided pigtail chest tube, partially imaged. Heterogeneous and ground-glass airspace disease of the dependent right lung base. 4. Hepatosplenomegaly. Electronically Signed   By: Jearld Lesch M.D.   On:  08/29/2021 20:25  ? ?DG CHEST PORT 1 VIEW ? ?Result Date: 08/30/2021 ?CLINICAL DATA:  Pleural effusion.  Chest tube placement. EXAM: PORTABLE CHEST 1 VIEW COMPARISON:  08/30/2021; 08/29/2021; CT the chest, abdomen pelvis-08/28/2021 FINDINGS: Grossly unchanged cardiac silhouette and mediastinal contours. Stable positioning of right-sided chest tube. No pneumothorax. Interval placement of endotracheal scratch the interval placement of enteric tube with tip and side port projected below the left hemidiaphragm. Worsening heterogeneous consolidative opacities involving the medial aspect of the right upper lung. Worsening left basilar heterogeneous opacities. Mild pulmonary venous congestion without frank evidence of edema. No definite pleural effusion. Redemonstrated fracture and deformity of the right clavicle. Previously questioned pneumoperitoneum is not definitively seen on the present examination. IMPRESSION: 1.  Stable positioning of support apparatus.  No pneumothorax. 2. Previously questioned pneumoperitoneum is not definitely seen on the present examination. 3. Worsening right upper and left basilar heterogeneous/consolidative opacities, atelectasis versus infiltrate. 4. Redemonstrated fracture and deformity involving the mid aspect the right clavicle. Electronically Signed   By: Simonne Come M.D.   On: 08/30/2021 07:52  ? ?DG CHEST PORT 1 VIEW ? ?Result Date: 08/29/2021 ?CLINICAL DATA:  Pneumoperitoneum of unknown etiology. EXAM: PORTABLE CHEST 1 VIEW COMPARISON:  Same day chest radiograph. FINDINGS: The heart size and mediastinal contours are within normal limits. A right pleural pigtail catheter is noted. The lungs are clear and there is no significant pleural effusion or pneumothorax. A right clavicle fracture with associated bony destruction is redemonstrated. Pneumoperitoneum is redemonstrated. IMPRESSION: No acute pulmonary process.  Pneumoperitoneum. Electronically Signed   By: Romona Curls M.D.   On:  08/29/2021 17:27  ? ?DG Chest Port 1 View ? ?Result Date: 08/29/2021 ?CLINICAL DATA:  Right pleural effusion EXAM: PORTABLE CHEST 1 VIEW COMPARISON:  None. FINDINGS: Right-sided pigtail chest tube is in position. No significant pleural effusion or pneumothorax. The left lung is normally aerated. Heart and mediastinum are normal. Possible small volume pneumoperitoneum underlying the right hemidiaphragm. Displaced fracture of the mid right clavicle with bony destruction. IMPRESSION: 1. Right-sided pigtail chest tube is in position. No significant pleural effusion or pneumothorax. 2. Possible small volume pneumoperitoneum underlying the right hemidiaphragm. These results will be called to the ordering clinician or representative by the Radiologist Assistant, and communication documented in the PACS or [employer]. Electronically Signed   By: Jearld Lesch M.D.   On: 08/29/2021 15:51  ? ?DG Abd Portable 1V ? ?Result Date: 08/30/2021 ?CLINICAL DATA:  Nasogastric tube placement EXAM: PORTABLE ABDOMEN - 1 VIEW COMPARISON:  Abdominal CT from yesterday FINDINGS: Enteric tube with tip and side-port over the stomach. Surgical drain seen over the abdomen. Enteric contrast from prior study is noted in the colon. No gas dilated bowel. IMPRESSION: Enteric tube with tip and side port at the stomach. Electronically Signed   By: Tiburcio Pea M.D.   On: 08/30/2021 06:27  ? ?DG Abd Portable 1V ? ?Result Date: 08/29/2021 ?CLINICAL DATA:  Pneumoperitoneum of unknown etiology EXAM: PORTABLE ABDOMEN - 1 VIEW COMPARISON:  Same day chest radiograph and CT abdomen pelvis dated 08/28/2021. FINDINGS: Pneumoperitoneum is seen beneath the diaphragm. A pigtail catheter overlies the right mid lung. No evidence of bowel obstruction. IMPRESSION: Pneumoperitoneum.  Nonobstructive bowel gas pattern. Electronically Signed   By: Romona Curls M.D.   On: 08/29/2021 17:25  ? ?  ECHOCARDIOGRAM COMPLETE ? ?Result Date: 08/29/2021 ?   ECHOCARDIOGRAM REPORT    Patient Name:   Kimberly PotashDIANA G Eilts Date of Exam: 08/29/2021 Medical Rec #:  [id number]     Height:       67.0 in Accession #:    [id number]    Weight:       190.5 lb Date of Birth:  30-Dec-1985     BSA:          1.98

## 2021-08-31 NOTE — Consult Note (Signed)
? ?ORTHOPAEDIC CONSULTATION ? ?REQUESTING PHYSICIAN: Burnadette Pop, MD ? ?Chief Complaint: Draining abscess right clavicle ? ?HPI: ?Kimberly Booker is a 36 y.o. female who presents with multiple medical problems including a abscess and osteomyelitis of the right clavicle status post closed fracture. ? ?Past Medical History:  ?Diagnosis Date  ? Anxiety with depression 08/26/2021  ? Polysubstance abuse (HCC) 08/26/2021  ? Suicide attempt by drug overdose (HCC) 08/26/2021  ? ?Past Surgical History:  ?Procedure Laterality Date  ? CESAREAN SECTION    ? LAPAROSCOPY N/A 08/29/2021  ? Procedure: LAPAROSCOPY DIAGNOSTIC, EXPLORATORY LAPAROTOMY; REPAIR OF GASTRIC ULCER;  Surgeon: Fritzi Mandes, MD;  Location: [facility] OR;  Service: General;  Laterality: N/A;  ? LAPAROTOMY  08/29/2021  ? Procedure: EXPLORATORY LAPAROTOMY;  Surgeon: Fritzi Mandes, MD;  Location: [facility] OR;  Service: General;;  ? LUMBAR LAMINECTOMY/DECOMPRESSION MICRODISCECTOMY N/A 08/28/2021  ? Procedure: LUMBAR LAMINECTOMY WITH DEBRIDEMENT OF ABSCESS Lumbar four - five;  Surgeon: Lisbeth Renshaw, MD;  Location: [facility] OR;  Service: Neurosurgery;  Laterality: N/A;  ? THORACENTESIS Right 08/29/2021  ? Procedure: THORACENTESIS;  Surgeon: Leslye Peer, MD;  Location: [facility] ENDOSCOPY;  Service: Cardiopulmonary;  Laterality: Right;  ? ?Social History  ? ?Socioeconomic History  ? Marital status: Legally Separated  ?  Spouse name: Not on file  ? Number of children: Not on file  ? Years of education: Not on file  ? Highest education level: Not on file  ?Occupational History  ? Not on file  ?Tobacco Use  ? Smoking status: Every Day  ?  Types: Cigarettes  ? Smokeless tobacco: Never  ?Substance and Sexual Activity  ? Alcohol use: Not Currently  ? Drug use: Yes  ?  Types: Methamphetamines, Heroin  ? Sexual activity: Not on file  ?Other Topics Concern  ? Not on file  ?Social History Narrative  ? Not on file  ? ?Social Determinants of Health  ? ?Financial Resource Strain: Not on file   ?Food Insecurity: Not on file  ?Transportation Needs: Not on file  ?Physical Activity: Not on file  ?Stress: Not on file  ?Social Connections: Not on file  ? ?Family History  ?Problem Relation Age of Onset  ? Hypertension Other   ? ?- negative except otherwise stated in the family history section ?Allergies  ?Allergen Reactions  ? Amoxicillin Swelling  ?  Throat closes  ? ?Prior to Admission medications   ?Medication Sig Start Date End Date Taking? Authorizing Provider  ?ibuprofen (ADVIL) 200 MG tablet Take 800 mg by mouth every 6 (six) hours as needed for moderate pain.   Yes [provider]  ?FLUoxetine (PROZAC) 40 MG capsule Take 40 mg by mouth daily.    [provider]  ? ?CT ABDOMEN PELVIS W CONTRAST ? ?Result Date: 08/29/2021 ?CLINICAL DATA:  Pneumoperitoneum EXAM: CT ABDOMEN AND PELVIS WITH CONTRAST TECHNIQUE: Multidetector CT imaging of the abdomen and pelvis was performed using the standard protocol following bolus administration of intravenous contrast. RADIATION DOSE REDUCTION: This exam was performed according to the departmental dose-optimization program which includes automated exposure control, adjustment of the mA and/or kV according to patient size and/or use of iterative reconstruction technique. CONTRAST:  OMNIPAQUE IOHEXOL 350 MG/ML SOLN COMPARISON:  CT abdomen pelvis, 08/28/2021 FINDINGS: Lower chest: Small bilateral pleural effusions. Heterogeneous and ground-glass airspace disease of the dependent right lung base. Right-sided pigtail chest tube, partially imaged. Hepatobiliary: No solid liver abnormality is seen. Hepatomegaly, maximum coronal span 21.1 cm. No gallstones, gallbladder wall  thickening, or biliary dilatation. Pancreas: Unremarkable. No pancreatic ductal dilatation or surrounding inflammatory changes. Spleen: Mild splenomegaly, maximum span 14.1 cm. Adrenals/Urinary Tract: Adrenal glands are unremarkable. Kidneys are normal, without renal calculi, solid  lesion, or hydronephrosis. Bladder is unremarkable. Stomach/Bowel: Stomach is within normal limits. Appendix appears normal. No evidence of bowel wall thickening, distention, or inflammatory changes. Vascular/Lymphatic: No significant vascular findings are present. No enlarged abdominal or pelvic lymph nodes. Reproductive: Status post hysterectomy. Other: Anasarca. Small volume pneumoperitoneum. Small volume ascites. Musculoskeletal: No acute or significant osseous findings. Status post L4 laminectomy. Epidural catheter. IMPRESSION: 1. Small volume pneumoperitoneum without apparent etiology. 2. Anasarca and small volume ascites. 3. Right-sided pigtail chest tube, partially imaged. Heterogeneous and ground-glass airspace disease of the dependent right lung base. 4. Hepatosplenomegaly. Electronically Signed   By: Jearld LeschAlex D Bibbey M.D.   On: 08/29/2021 20:25  ? ?DG CHEST PORT 1 VIEW ? ?Result Date: 08/30/2021 ?CLINICAL DATA:  Pleural effusion.  Chest tube placement. EXAM: PORTABLE CHEST 1 VIEW COMPARISON:  08/30/2021; 08/29/2021; CT the chest, abdomen pelvis-08/28/2021 FINDINGS: Grossly unchanged cardiac silhouette and mediastinal contours. Stable positioning of right-sided chest tube. No pneumothorax. Interval placement of endotracheal scratch the interval placement of enteric tube with tip and side port projected below the left hemidiaphragm. Worsening heterogeneous consolidative opacities involving the medial aspect of the right upper lung. Worsening left basilar heterogeneous opacities. Mild pulmonary venous congestion without frank evidence of edema. No definite pleural effusion. Redemonstrated fracture and deformity of the right clavicle. Previously questioned pneumoperitoneum is not definitively seen on the present examination. IMPRESSION: 1.  Stable positioning of support apparatus.  No pneumothorax. 2. Previously questioned pneumoperitoneum is not definitely seen on the present examination. 3. Worsening right  upper and left basilar heterogeneous/consolidative opacities, atelectasis versus infiltrate. 4. Redemonstrated fracture and deformity involving the mid aspect the right clavicle. Electronically Signed   By: Simonne ComeJohn  Watts M.D.   On: 08/30/2021 07:52  ? ?DG CHEST PORT 1 VIEW ? ?Result Date: 08/29/2021 ?CLINICAL DATA:  Pneumoperitoneum of unknown etiology. EXAM: PORTABLE CHEST 1 VIEW COMPARISON:  Same day chest radiograph. FINDINGS: The heart size and mediastinal contours are within normal limits. A right pleural pigtail catheter is noted. The lungs are clear and there is no significant pleural effusion or pneumothorax. A right clavicle fracture with associated bony destruction is redemonstrated. Pneumoperitoneum is redemonstrated. IMPRESSION: No acute pulmonary process.  Pneumoperitoneum. Electronically Signed   By: Romona Curlsyler  Litton M.D.   On: 08/29/2021 17:27  ? ?DG Chest Port 1 View ? ?Result Date: 08/29/2021 ?CLINICAL DATA:  Right pleural effusion EXAM: PORTABLE CHEST 1 VIEW COMPARISON:  None. FINDINGS: Right-sided pigtail chest tube is in position. No significant pleural effusion or pneumothorax. The left lung is normally aerated. Heart and mediastinum are normal. Possible small volume pneumoperitoneum underlying the right hemidiaphragm. Displaced fracture of the mid right clavicle with bony destruction. IMPRESSION: 1. Right-sided pigtail chest tube is in position. No significant pleural effusion or pneumothorax. 2. Possible small volume pneumoperitoneum underlying the right hemidiaphragm. These results will be called to the ordering clinician or representative by the Radiologist Assistant, and communication documented in the PACS or [employer] Dashboard. Electronically Signed   By: Jearld LeschAlex D Bibbey M.D.   On: 08/29/2021 15:51  ? ?DG Abd Portable 1V ? ?Result Date: 08/30/2021 ?CLINICAL DATA:  Nasogastric tube placement EXAM: PORTABLE ABDOMEN - 1 VIEW COMPARISON:  Abdominal CT from yesterday FINDINGS: Enteric tube with tip and  side-port over the stomach. Surgical drain seen over the abdomen. Enteric  contrast from prior study is noted in the colon. No gas dilated bowel. IMPRESSION: Enteric tube with tip and side port at the stomach. Elec

## 2021-08-31 NOTE — Progress Notes (Signed)
?  NEUROSURGERY PROGRESS NOTE  ? ?No issues overnight. Pt reports being very uncomfortable with abd pain, back pain, and leg pain. ? ?EXAM:  ?BP 129/89 (BP Location: Left Arm)   Pulse 78   Temp 98.5 ?F (36.9 ?C) (Oral)   Resp 13   Ht 5\' 7"  (1.702 m)   Wt 86.4 kg   LMP  (LMP Unknown) Comment: pregnancy test neg.  SpO2 96%   BMI 29.83 kg/m?  ? ?Awake, alert, oriented  ?Speech fluent, appropriate  ?CN grossly intact  ?Good strength BUE/BLE ?Hemovac 40cc ? ?IMPRESSION:  ?36 y.o. 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 ?PLAN: ?- Can d/c hemovac today ?- Cont to treat spinal component of disseminated infection with IV abx. Do not seen any need for further debridement from a neurosurgical stanpoint ?- Can mobilize from NS standpoint ?- Cont w/u and mgmt per primary team ? ? ?31, MD ?[facility]  ? ?

## 2021-08-31 NOTE — Progress Notes (Addendum)
? ?NAME:  Kimberly Booker, MRN:  [id number], DOB:  26-Sep-1985, LOS: 5 ?ADMISSION DATE:  08/26/2021, CONSULTATION DATE: 3/26 ?REFERRING MD: Dr. Renford Dills, CHIEF COMPLAINT: Shoulder pain ? ?History of Present Illness:  ?36 year old female who presented to [facility] from [facility] with reports of right shoulder pain. ? ?The patient reportedly suffered a right clavicle navicular fracture approximately 8 months prior to admission.  She has known history of IV drug abuse.  She reported increasing redness and pain in the right shoulder for approximately 2 weeks prior to admission to [facility] (08/15/21).  The patient had taken a razor blade to open the abscess for drainage.  She was prescribed clindamycin.  Despite antibiotics she continued to feel poorly and was admitted on 3/12 to [facility]  She was found to have MRSA bacteremia and right shoulder abscess.  She had a TTE that was not suggestive of endocarditis but CT of the chest showed possible septic emboli.  Shoulder imaging was concerning for possible osteosarcoma versus phlegmon and the patient was transferred to [facility] for orthopedic services.  She was treated with ceftriaxone, vancomycin and later transitioned to daptomycin.  Despite antibiotics she continued to have positive blood cultures.   ? ?Imaging of her spine revealed an L5-S1 discitis with osteomyelitis, bilateral SI septic arthritis.  In addition she had bilateral iliopsoas abscess and dorsal epidural abscess at L4-L5 with compression of thecal sac.  Right shoulder showed an ill-defined fluid and phlegmon that appears to drain to the surface of the skin anteriorly on MRI.  Patient was seen by neurosurgery and was taken on 3/25 for L4-L5 laminectomy and debridement of epidural abscess.  Patient reports significant relief from pain after debridement.  CT of the chest on 3/25 demonstrates a large loculated right pleural effusion, destructive process of the right mid clavicle,  subcentimeter groundglass nodularity within bilateral lungs concerning for septic emboli. ? ?PCCM consulted for evaluation of pleural effusion. ? ?Pertinent  Medical History  ?Depression ?Anxiety ?Suicide attempts ?Polysubstance abuse ?Severe protein calorie malnutrition ?MRSA bacteremia ? ?Significant Hospital Events: ?Including procedures, antibiotic start and stop dates in addition to other pertinent events   ?3/23 admit to [facility] from [facility] with MRSA bacteremia, osteomyelitis/septic arthritis ?3/25 2 OR for laminectomy and debridement of epidural abscess.  CT chest with large right pleural effusion, concern for septic emboli bilaterally  ?3/26 PCCM consulted for evaluation of pleural effusion, chest tube placed ?3/27 Developed perf gastric ulcer and had emergency ex lap ? ?Interim History / Subjective:  ? ?No acute events overnight. ? ?Objective   ?Blood pressure 129/89, pulse 78, temperature 98.5 ?F (36.9 ?C), temperature source Oral, resp. rate 13, height 5\' 7"  (1.702 m), weight 86.4 kg, SpO2 96 %. ?   ?   ? ?Intake/Output Summary (Last 24 hours) at 08/31/2021 1009 ?Last data filed at 08/31/2021 0818 ?Gross per 24 hour  ?Intake 0 ml  ?Output 3975 ml  ?Net -3975 ml  ? ?Filed Weights  ? 08/28/21 0500 08/28/21 1059 08/29/21 1417  ?Weight: 86.4 kg 86.4 kg 86.4 kg  ? ? ?Examination: ?Gen:      No acute distress, chronically ill appearing ?HEENT:  EOMI, sclera anicteric ?Neck:     No masses; no thyromegaly ?Lungs:    Clear to auscultation bilaterally; normal respiratory effort ?CV:         Regular rate and rhythm; no murmurs ?Abd:      + bowel sounds; soft, non-tender; no palpable masses, no distension ?Ext:  No edema; adequate peripheral perfusion ?Skin:      Warm and dry; no rash ?Neuro: alert and oriented x 3 ?Psych: normal mood and affect  ? ?Resolved Hospital Problem list   ? ? ?Assessment & Plan:  ?MRSA Bacteremia ?Septic Arthritis, Epidural Abscess, Right Shoulder Osteoarthritis ?Large Right  Pleural Effusion s/p chest tube placement on 3/26  ?Suspected Septic Emboli ?IVDA  ? ?Fluid is exudative and is completely drained on CXR and CT abd. No need for DNAse, TPA ?Monitor output from chest tube and follow CXR ?Vancomycin per ID team. ? ?Best Practice (right click and "Reselect all SmartList Selections" daily)  ?Per primary  ? ?Critical care time: NA  ? ?Chilton Greathouse MD ?[facility] ?See Amion for pager ? ?If no response to pager , please call [phone] until 7pm ?After 7:00 pm call Elink  [phone] ?08/31/2021, 10:09 AM  ? ? ? ?

## 2021-08-31 NOTE — Plan of Care (Signed)
VSS. C/o pain given PRN medication. MD made aware of patient uncontrolled pain, Fentanyl patch and tylenol added PRN. Anxiety medication added PRN. Abdominal drsg intact. Drains intact. Hemovac removed this afternoon, per order. Foley removed, patient voiding. NG tube intact- Per surgery team this AM, pt is allowed to have less than a 1/4 cup of ice chips per shift. Bed alarm on. Call bell within reach.  ? ?Problem: Clinical Measurements: ?Goal: Ability to maintain clinical measurements within normal limits will improve ?Outcome: Progressing ?Goal: Will remain free from infection ?Outcome: Progressing ?Goal: Diagnostic test results will improve ?Outcome: Progressing ?Goal: Respiratory complications will improve ?Outcome: Progressing ?Goal: Cardiovascular complication will be avoided ?Outcome: Progressing ?  ?Problem: Elimination: ?Goal: Will not experience complications related to bowel motility ?Outcome: Progressing ?Goal: Will not experience complications related to urinary retention ?Outcome: Progressing ?  ?Problem: Pain Managment: ?Goal: General experience of comfort will improve ?Outcome: Progressing ?  ?Problem: Safety: ?Goal: Ability to remain free from injury will improve ?Outcome: Progressing ?  ?Problem: Skin Integrity: ?Goal: Risk for impaired skin integrity will decrease ?Outcome: Progressing ?  ?

## 2021-08-31 NOTE — Progress Notes (Signed)
Foley removed at 1200, patient DTV by 1800.  ?

## 2021-08-31 NOTE — Progress Notes (Signed)
Inpatient Rehab Admissions Coordinator Note:  ? ?Per PT recommendations patient was screened for CIR candidacy by Stephania Fragmin, PT. At this time, pt appears to be a potential candidate for CIR. I will place an order for rehab consult for full assessment, per our protocol.  Please contact me any with questions.. ? ?Estill Dooms, PT, DPT ?[phone] ?08/31/21 ?5:17 PM  ? ?

## 2021-08-31 NOTE — Evaluation (Signed)
Physical Therapy Evaluation ?Patient Details ?Name: Kimberly Booker ?MRN: [id number] ?DOB: 1986-01-27 ?Today's Date: 08/31/2021 ? ?History of Present Illness ? 36 year old with past medical history significant for anxiety, depression, suicidal attempt drug overdose, polysubstance abuse and right clavicle fracture 8 months ago.  She reported having occasional pain but a week ago and  was admitted at [facility] on 08/15/2021 due to right shoulder abscess and underwent I&D in the ED started IV antibiotics. Found to have MRSA bacteremia and TEE ruled out endocarditis.  Transferred to [facility] and found to have right clavicle osteomyelitis/phlegmon/abscess, widespread cervical,thoracic,lumbar discitis/osteomyelitis/epidural abscess/cord impingement and bilateral iliopsoas abscess, right complex pleural effusion.  Underwent lumbar laminectomy / debridement of abscess, L4-L5 on 3/25.  Developed right-sided empyema and gastric perforation.  Status post emergent laparotomy and perforation of repair by general surgery on 3/27.  PCCM also following for chest tube on the right side. ?  ?Clinical Impression ? Patient presents with decreased mobility due to pain, limited activity tolerance, decreased balance, decreased strength and she will benefit from skilled PT in the acute setting to maximize mobility prior to d/c.  She is hopeful to progress to tolerate acute inpatient rehab stay prior to d/c home.  Previously she was independent, currently max A for sit to partial stand at the bedside and not well tolerated so deferred OOB to chair and left with bed in chair position.  ?   ? ?Recommendations for follow up therapy are one component of a multi-disciplinary discharge planning process, led by the attending physician.  Recommendations may be updated based on patient status, additional functional criteria and insurance authorization. ? ?Follow Up Recommendations Acute inpatient rehab (3hours/day) ? ?  ?Assistance Recommended at  Discharge    ?Patient can return home with the following ? Two people to help with walking and/or transfers;A lot of help with bathing/dressing/bathroom;Assistance with cooking/housework;Direct supervision/assist for medications management;Assist for transportation;Help with stairs or ramp for entrance;Direct supervision/assist for financial management ? ?  ?Equipment Recommendations Rolling walker (2 wheels)  ?Recommendations for Other Services ?    ?  ?Functional Status Assessment Patient has had a recent decline in their functional status and demonstrates the ability to make significant improvements in function in a reasonable and predictable amount of time.  ? ?  ?Precautions / Restrictions Precautions ?Precautions: Fall;Back ?Precaution Comments: R chest tube to suction, L JP drain, NGT to suction  ? ?  ? ?Mobility ? Bed Mobility ?Overal bed mobility: Needs Assistance ?Bed Mobility: Rolling, Sidelying to Sit, Sit to Sidelying ?Rolling: Mod assist ?Sidelying to sit: Max assist ?  ?  ?Sit to sidelying: Mod assist ?General bed mobility comments: heavy lifting for trunk, guiding legs off bed with mod cues; to sidelying assist for legs and cues for lowering trunk ?  ? ?Transfers ?Overall transfer level: Needs assistance ?  ?Transfers: Bed to chair/wheelchair/BSC ?  ?  ?  ?Squat pivot transfers: Max assist ?  ?  ?General transfer comment: up to squat position at bedside and scooting feet to move hips closer to head of bed prior to return to sit; heavy lifting help; too painful to stand fully ?  ? ?Ambulation/Gait ?  ?  ?  ?  ?  ?  ?  ?  ? ?Stairs ?  ?  ?  ?  ?  ? ?Wheelchair Mobility ?  ? ?Modified Rankin (Stroke Patients Only) ?  ? ?  ? ?Balance Overall balance assessment: Needs assistance ?Sitting-balance support: Feet supported ?Sitting  balance-Leahy Scale: Poor ?Sitting balance - Comments: leaning back due to abdominal pain, needing mod support at times ?  ?  ?Standing balance-Leahy Scale: Zero ?Standing balance  comment: max A up to squat position; unable to stand erect ?  ?  ?  ?  ?  ?  ?  ?  ?  ?  ?  ?   ? ? ? ?Pertinent Vitals/Pain Pain Assessment ?Pain Assessment: Faces ?Faces Pain Scale: Hurts worst ?Pain Location: abdomen with mobility ?Pain Descriptors / Indicators: Grimacing, Guarding, Discomfort ?Pain Intervention(s): Monitored during session, Repositioned, Premedicated before session, Limited activity within patient's tolerance  ? ? ?Home Living Family/patient expects to be discharged to:: Private residence ?Living Arrangements: Other relatives (lives with sister) ?Available Help at Discharge: Family;Available PRN/intermittently ?Type of Home: House ?Home Access: Stairs to enter ?Entrance Stairs-Rails: Left;Right;Can reach both ?Entrance Stairs-Number of Steps: 8 ?Alternate Level Stairs-Number of Steps: flight ?Home Layout: Two level;Bed/bath upstairs ?Home Equipment: None ?   ?  ?Prior Function Prior Level of Function : Independent/Modified Independent ?  ?  ?  ?  ?  ?  ?  ?  ?  ? ? ?Hand Dominance  ?   ? ?  ?Extremity/Trunk Assessment  ? Upper Extremity Assessment ?Upper Extremity Assessment: Generalized weakness;RUE deficits/detail ?RUE Deficits / Details: shoulder with bandaging, not tested for ROM ?  ? ?Lower Extremity Assessment ?Lower Extremity Assessment: Generalized weakness ?  ? ?Cervical / Trunk Assessment ?Cervical / Trunk Assessment: Back Surgery;Other exceptions ?Cervical / Trunk Exceptions: abdominal surgery  ?Communication  ? Communication: No difficulties  ?Cognition Arousal/Alertness: Awake/alert ?Behavior During Therapy: Anxious ?Overall Cognitive Status: Within Functional Limits for tasks assessed ?  ?  ?  ?  ?  ?  ?  ?  ?  ?  ?  ?  ?  ?  ?  ?  ?  ?  ?  ? ?  ?General Comments   ? ?  ?Exercises Other Exercises ?Other Exercises: seated LAQ and ankle pumps x 10  ? ?Assessment/Plan  ?  ?PT Assessment Patient needs continued PT services  ?PT Problem List Decreased strength;Decreased  mobility;Decreased safety awareness;Decreased activity tolerance;Decreased balance;Decreased knowledge of use of DME;Pain;Decreased knowledge of precautions ? ?   ?  ?PT Treatment Interventions DME instruction;Therapeutic activities;Patient/family education;Therapeutic exercise;Gait training;Balance training;Functional mobility training   ? ?PT Goals (Current goals can be found in the Care Plan section)  ?Acute Rehab PT Goals ?Patient Stated Goal: to go to rehab ?PT Goal Formulation: With patient ?Time For Goal Achievement: 09/14/21 ?Potential to Achieve Goals: Good ? ?  ?Frequency Min 3X/week ?  ? ? ?Co-evaluation   ?  ?  ?  ?  ? ? ?  ?AM-PAC PT "6 Clicks" Mobility  ?Outcome Measure Help needed turning from your back to your side while in a flat bed without using bedrails?: A Lot ?Help needed moving from lying on your back to sitting on the side of a flat bed without using bedrails?: Total ?Help needed moving to and from a bed to a chair (including a wheelchair)?: Total ?Help needed standing up from a chair using your arms (e.g., wheelchair or bedside chair)?: Total ?Help needed to walk in hospital room?: Total ?Help needed climbing 3-5 steps with a railing? : Total ?6 Click Score: 7 ? ?  ?End of Session   ?Activity Tolerance: Patient limited by pain ?Patient left: in bed;with call bell/phone within reach (bed in semi chair position) ?Nurse Communication: Mobility status ?PT Visit  Diagnosis: Other abnormalities of gait and mobility (R26.89);Muscle weakness (generalized) (M62.81);Pain ?Pain - Right/Left: Right ?Pain - part of body: Shoulder (and abdomen) ?  ? ?Time: [id number] ?PT Time Calculation (min) (ACUTE ONLY): 24 min ? ? ?Charges:   PT Evaluation ?$PT Eval Moderate Complexity: 1 Mod ?PT Treatments ?$Therapeutic Activity: 8-22 mins ?  ?   ? ? ?Magda Kiel, PT ?Acute Rehabilitation Services ?[id number] ?Office:[phone] ?08/31/2021 ? ? ?Reginia Naas ?08/31/2021, 5:05 PM ? ?

## 2021-08-31 NOTE — Progress Notes (Addendum)
?PROGRESS NOTE ? ?Kimberly Booker  UXY:[id number] DOB: [birth year] DOA: 08/26/2021 ?PCP: Pcp, No  ? ?Brief Narrative: ?36 year old with past medical history significant for anxiety, depression, suicidal attempt drug overdose, polysubstance abuse , right clavicle fracture 8 months ago, she reported having occasional pain but a week ago and  was admitted at [facility] on  08/15/2021 due to right shoulder abscess.She was treated with I&D in the emergency department followed by IV antibiotics therapy as an inpatient.  She was diagnosed with MRSA bacteremia ,endocarditis was ruled out with a TEE performed 3/16,but she continued to have positive blood cultures despite treatment with ceftriaxone and vancomycin.  Antibiotic therapy was changed to daptomycin.  She was then transferred to [facility]. ?Extensive imagings have been done here which showed right clavicle osteomyelitis/phlegmon/abscess, widespread cervical,thoracic,lumbar discitis/osteomyelitis/epidural abscess/cord impingement.  MRI also showed bilateral iliopsoas abscess, right complex pleural effusion.  ID, orthopedics, neurosurgery following.    Underwent lumbar laminectomy / debridement of abscess, L4-L5 on 3/25.  Hospital course also remarkable for finding of right-sided empyema, gastric perforation.  Status post emergent laparotomy and perforation of repair by general surgery.  PCCM also following for chest tube on the right side  ? ?Assessment & Plan: ? ?Principal Problem: ?  MRSA bacteremia ?Active Problems: ?  Abscess in epidural space of lumbar spine ?  Gastric perforation (HCC) ?  Pathologic fracture of right clavicle ?  Anxiety with depression ?  Polysubstance abuse (HCC) ?  Severe protein-calorie malnutrition (HCC) ?  Transaminitis ?  Normocytic anemia ?  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  Pleural effusion ?  Iliopsoas abscess (HCC) ?  Osteomyelitis of clavicle (HCC) ? ? ?Assessment and Plan: ?* MRSA bacteremia ?-Patient likely  has disseminated MRSA bacteremia.  ?-Continue abx as per ID,on dapto ?-Repeat blood cultures sent on 3/24 ?-She had TEE outside facility on 3/16, negative for endocarditis as per the report but ID trying to find the documented information ?-ID following ?-TTE did not show any vegetation, might need TEE here but has been delayed due emergent surgery for gastric perforation. ? 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 Bilateral facet arthritis at L5-S1. Dorsal epidural abscess at L4 and L5,highly compressive on the thecal sac. ?S/P  lumbar laminectomy, debridement of abscess, L4-L5 on 3/25 ? ?Gastric perforation (HCC) ?Complaining of abdominal pain on 3/26.  Chest x-ray done after chest tube placement showed incidental finding of pneumoperitoneum.  CT abdomen/pelvis confirmed pneumoperitoneum.  General surgery consulted and she underwent emergent exploratory laparatomy with finding of gastric perforation.  Currently on NG tube.  General surgery following, recommending n.p.o.  Continue Protonix IV ? ?Iliopsoas abscess (HCC) ?Pelvic MRI showed bilateral infectious ?sacroiliitis and osteomyelitis of the sacrum and periarticular right iliac bone. Bilateral psoas abscesses ,Small bilateral paraspinal muscle abscesses,intramuscular abscess within the left piriformis muscle ,small intramuscular abscess within the left gluteus medius muscle. ?The plan is to continue antibiotics for these.IR also consulted to check if we can drain the abscess. ? ? ?Pleural effusion ?Chest CT showed a large loculated right pleural effusion concerning for empyema, subcentimeter groundglass nodularity within the bilateral lungs concerning for septic emboli.  S/P chest tube placement.  PCCM following ? ?Abscess in epidural space of cervical spine ?Discitis/osteomyelitis at C2-3, C5-6, and C6-7. Ventral epidural abscess at C2-3, C5, and C6 with cord impingement  especially at the lower 2 levels. Facet arthritis on the left at C7-T1 and right at C3-4. ?-  Also showed discitis/osteomyelitis of T9- T10, T11-T12 ?-Neurosurgery following. ? ?Normocytic anemia ?Monitor hb.  Currently stable.  Received 2 units PRBC outside facility.  ? ?Severe protein-calorie malnutrition (HCC) ?Nutrition is consulted ? ?Polysubstance abuse (HCC) ?She declined IV drug abuse recently, last drug use was 2 years ago as per her ? ?Anxiety with depression ?Takes Prozac and Atarax.  Currently n.p.o. so continue IV lorazepam as needed ? ?Pathologic fracture of right clavicle ?MRI of the shoulder showed osteomyelitis, phlegmon, possible abscess.  Orthopedics following and planning for shoulder washout ? ? ? ? ?Nutrition Problem: Increased nutrient needs ?Etiology: acute illness, post-op healing, wound healing ?  ? ?DVT prophylaxis:enoxaparin (LOVENOX) injection 40 mg Start: 08/29/21 1000 ?Place and maintain sequential compression device Start: 08/28/21 1049 ?Lovenox ? ?  Code Status: Full Code ? ?Family Communication: Called and discussed with father on phone on 3/27 ?Patient status: Inpatient ? ?Patient is from : Home ? ?Anticipated discharge to: Home ? ?Estimated DC date: Will be a prolonged hospitalization ? ? ?Consultants: ID, neurosurgery, orthopedics ? ?Procedures:  laminectomy ? ?Antimicrobials:  ?Anti-infectives (From admission, onward)  ? ? Start     Dose/Rate Route Frequency Ordered Stop  ? 08/30/21 1000  metroNIDAZOLE (FLAGYL) IVPB 500 mg  Status:  Discontinued       ? 500 mg ?100 mL/hr over 60 Minutes Intravenous Every 12 hours 08/30/21 0036 08/30/21 1708  ? 08/30/21 0600  metroNIDAZOLE (FLAGYL) IVPB 500 mg  Status:  Discontinued       ? 500 mg ?100 mL/hr over 60 Minutes Intravenous On call to O.R. 08/30/21 0220 08/30/21 0229  ? 08/30/21 0245  ciprofloxacin (CIPRO) IVPB 400 mg  Status:  Discontinued       ? 400 mg ?200 mL/hr over 60 Minutes Intravenous Every 12 hours 08/30/21 0037 08/30/21 1708   ? 08/30/21 0045  cefTRIAXone (ROCEPHIN) 2 g in sodium chloride 0.9 % 100 mL IVPB  Status:  Discontinued       ? 2 g ?200 mL/hr over 30 Minutes Intravenous Every 24 hours 08/30/21 0036 08/30/21 0036  ? 08/29/21 2315  metroNIDAZOLE (FLAGYL) IVPB 500 mg       ?Note to Pharmacy: TO MAIN OR STAT!! In surgery now  ? 500 mg ?100 mL/hr over 60 Minutes Intravenous To Surgery 08/29/21 2302 08/29/21 2319  ? 08/29/21 2200  vancomycin (VANCOREADY) IVPB 1250 mg/250 mL       ? 1,250 mg ?166.7 mL/hr over 90 Minutes Intravenous Every 12 hours 08/29/21 1857    ? 08/29/21 2100  Vancomycin (VANCOCIN) 1,250 mg in sodium chloride 0.9 % 250 mL IVPB  Status:  Discontinued       ? 1,250 mg ?166.7 mL/hr over 90 Minutes Intravenous Every 12 hours 08/29/21 0837 08/29/21 1858  ? 08/29/21 0930  vancomycin (VANCOCIN) 2,000 mg in sodium chloride 0.9 % 500 mL IVPB       ? 2,000 mg ?260 mL/hr over 120 Minutes Intravenous  Once 08/29/21 0837 08/29/21 1257  ? 08/26/21 2000  cefTRIAXone (ROCEPHIN) 2 g in sodium chloride 0.9 % 100 mL IVPB  Status:  Discontinued       ? 2 g ?200 mL/hr over 30 Minutes Intravenous Every 24 hours 08/26/21 1614 08/28/21 1736  ? 08/26/21 2000  DAPTOmycin (CUBICIN) 700 mg in sodium chloride 0.9 % IVPB  Status:  Discontinued       ? 8 mg/kg ? 87.2 kg ?128 mL/hr over 30 Minutes Intravenous Daily 08/26/21 1819 08/29/21 0842  ? ?  ? ? ?  Subjective: ?Patient seen and examined at the bedside this morning.  Hemodynamically stable.  Complains of anxiety today, IV lorazepam ordered.  Denies any significant abdominal pain today, no nausea or vomiting.  Still on NG tube ?Objective: ?Vitals:  ? 08/30/21 1928 08/31/21 0414 08/31/21 0802 08/31/21 1300  ?BP: 133/86 128/87 129/89 117/75  ?Pulse: 73 76 78 81  ?Resp: 18 16 13  (!) 24  ?Temp: 98.2 ?F (36.8 ?C) 98.3 ?F (36.8 ?C) 98.5 ?F (36.9 ?C)   ?TempSrc: Oral Oral Oral   ?SpO2: 99% 95% 96% 98%  ?Weight:      ?Height:      ? ? ?Intake/Output Summary (Last 24 hours) at 08/31/2021 1435 ?Last data  filed at 08/31/2021 1400 ?Gross per 24 hour  ?Intake 0 ml  ?Output 3155 ml  ?Net -3155 ml  ? ?Filed Weights  ? 08/28/21 0500 08/28/21 1059 08/29/21 1417  ?Weight: 86.4 kg 86.4 kg 86.4 kg  ? ? ?Examination: ? ?

## 2021-09-01 ENCOUNTER — Encounter (HOSPITAL_COMMUNITY): Payer: Self-pay | Admitting: Internal Medicine

## 2021-09-01 ENCOUNTER — Inpatient Hospital Stay (HOSPITAL_COMMUNITY): Payer: Medicaid Other

## 2021-09-01 DIAGNOSIS — K6812 Psoas muscle abscess: Secondary | ICD-10-CM

## 2021-09-01 DIAGNOSIS — R7401 Elevation of levels of liver transaminase levels: Secondary | ICD-10-CM

## 2021-09-01 DIAGNOSIS — M86111 Other acute osteomyelitis, right shoulder: Secondary | ICD-10-CM

## 2021-09-01 DIAGNOSIS — D649 Anemia, unspecified: Secondary | ICD-10-CM

## 2021-09-01 DIAGNOSIS — G061 Intraspinal abscess and granuloma: Secondary | ICD-10-CM

## 2021-09-01 LAB — BASIC METABOLIC PANEL
Anion gap: 7 (ref 5–15)
BUN: 6 mg/dL (ref 6–20)
CO2: 24 mmol/L (ref 22–32)
Calcium: 8.3 mg/dL — ABNORMAL LOW (ref 8.9–10.3)
Chloride: 105 mmol/L (ref 98–111)
Creatinine, Ser: 0.32 mg/dL — ABNORMAL LOW (ref 0.44–1.00)
GFR, Estimated: >60 mL/min (ref >60)
Glucose, Bld: 84 mg/dL (ref 70–99)
Potassium: 3.8 mmol/L (ref 3.5–5.1)
Sodium: 136 mmol/L (ref 135–145)

## 2021-09-01 LAB — BODY FLUID CULTURE W GRAM STAIN
Culture: NO GROWTH
Gram Stain: NONE SEEN

## 2021-09-01 LAB — SURGICAL PATHOLOGY

## 2021-09-01 LAB — CULTURE, BLOOD (ROUTINE X 2)
Culture: NO GROWTH
Culture: NO GROWTH
Special Requests: ADEQUATE

## 2021-09-01 LAB — VANCOMYCIN, TROUGH: Vancomycin Tr: 8 ug/mL — ABNORMAL LOW (ref 15–20)

## 2021-09-01 LAB — CBC
HCT: 30.2 % — ABNORMAL LOW (ref 36.0–46.0)
Hemoglobin: 9.8 g/dL — ABNORMAL LOW (ref 12.0–15.0)
MCH: 30.7 pg (ref 26.0–34.0)
MCHC: 32.5 g/dL (ref 30.0–36.0)
MCV: 94.7 fL (ref 80.0–100.0)
Platelets: 556 10*3/uL — ABNORMAL HIGH (ref 150–400)
RBC: 3.19 MIL/uL — ABNORMAL LOW (ref 3.87–5.11)
RDW: 14.2 % (ref 11.5–15.5)
WBC: 12.9 10*3/uL — ABNORMAL HIGH (ref 4.0–10.5)
nRBC: 0 % (ref 0.0–0.2)

## 2021-09-01 LAB — VANCOMYCIN, PEAK: Vancomycin Pk: 20 ug/mL — ABNORMAL LOW (ref 30–40)

## 2021-09-01 MED ORDER — VANCOMYCIN HCL 1750 MG/350ML IV SOLN
1750.0000 mg | Freq: Two times a day (BID) | INTRAVENOUS | Status: DC
  Administered 2021-09-01 – 2021-09-06 (×10): 1750 mg via INTRAVENOUS
  Filled 2021-09-01 (×11): qty 350

## 2021-09-01 NOTE — Assessment & Plan Note (Addendum)
MRI of the shoulder showed osteomyelitis, phlegmon, possible abscess.  Orthopedics following.  Underwent debridement of right clavicle and application of wound VAC on 3/31.  Cultures pending. ? ?

## 2021-09-01 NOTE — Progress Notes (Addendum)
?   ? ? ? ? ?[facility]   ? ?Date of Admission:  08/26/2021    ? ?Total days of antibiotics 7+ (had antibiotics at previous hospitalization prior to arrival, ~18 days total) ?Vanco 03/26-continued ?Dapto 03/23-03/25 ?Cipro 03/26-03/27 ?Metro 03/36-03/27 ?Ceftriaxone 03/22-03/23 ?    ?Reason for Consult: MRSA    ?Referring Provider: EDP ?Primary Care Provider: unknown ? ?Assessment: ?Principal Problem: ?  MRSA bacteremia ?Active Problems: ?  Pathologic fracture of right clavicle ?  Anxiety with depression ?  Polysubstance abuse (HCC) ?  Protein-calorie malnutrition, severe (HCC) ?  Transaminitis ?  Normocytic anemia ?  Abscess in epidural space of lumbar spine ?  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  Pleural effusion ?  Iliopsoas abscess (HCC) ?  Gastric perforation (HCC) ? ?Plan: ?MRSA bacteremia with discitis, osteomyelitis/epidural abscess, osteomyelitis of Rt clavicle, bilateral iliopsoas abscess: ? 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 BC repeated at [facility] are negative. She is s/p laminectomy and debridement of L4/L5.  Patient is s/p exlap and continues to have abdominal pain in the setting of iliopsoas abscesses. Repeat CT scan shows large stable iliopsoas abscesses. Plan for IR to reevaluate patient for drainage. She also has operative debridement of right shoulder osteomyelitis planned for 03/31. TEE was performed on 03/16 without evidence of endocarditis. .  ?- Cont Vanco for at least 8 weeks ?- IR for reevaluation of iliopsoas abscesses ?- Ortho for right shoulder osteo debridement.  ?- No a candidate for PICC line with home antibiotics.  ? ?2. Gastric Ulcer complicated by pneumoperitoneum s/p ex lap. ?- Management per surgery  ? ?3. HCV AB positive  ?- HCV RNA negative ? ? Chlorhexidine Gluconate Cloth  6 each Topical Q0600  ? enoxaparin (LOVENOX) injection  40 mg Subcutaneous Q24H  ? fentaNYL  1 patch  Transdermal Q72H  ? FLUoxetine  40 mg Oral Daily  ? lidocaine  1 patch Transdermal Q24H  ? mouth rinse  15 mL Mouth Rinse BID  ? mupirocin ointment  1 application. Nasal BID  ? nicotine  21 mg Transdermal Daily  ? pantoprazole  40 mg Intravenous Q12H  ? sodium chloride flush  10 mL Other Q8H  ? ? ?HPI: Kimberly Booker is a 36 y.o. female anxiety, depression, polysubstance use disorder, chronic right clavicular fracture, hx of right MRSA shoulder abscess s/p I&D at [facility] on 08/15/21. Patient had positive BC for MRSA  and was treated with IV antibiotics. She had an echocardiogram (TEE?) on 03/16 without evidence of endocarditis. Patient was on ceftriaxone and vancomycin but switched to daptomycin at some point. She was ultimately transferred to [facility] for further evaluation and management. On arrivle, patient had extensive imaging showing evidence of wide-spread MRSA infection including multilevel discitis, osteomyelitis, and epidural abscess with the lumbar abscess s/p laminectomy by NS. She also has bilateral iliopsoas abscess and a right pleural effusion s/p pigtail cath placement. Her hospital course was complicated by exploratory laparotomy due to gastric ulcer with bowel perforation. She is currently on broad vancomycin.  ? ? ?Review of Systems: ?ROS - negative except what is noted in assessment and plan. ? ?Past Medical History:  ?Diagnosis Date  ? Anxiety with depression 08/26/2021  ? Polysubstance abuse (HCC) 08/26/2021  ? Suicide attempt by drug overdose (HCC) 08/26/2021  ? ? ?Social History  ? ?Tobacco Use  ? Smoking status: Every Day  ?  Types: Cigarettes  ? Smokeless tobacco: Never  ?Substance Use Topics  ? Alcohol use: Not Currently  ? Drug use: Yes  ?  Types: Methamphetamines, Heroin  ? ? ?Family History  ?Problem Relation Age of Onset  ? Hypertension Other   ? ?Allergies  ?Allergen Reactions  ? Amoxicillin Swelling  ?  Throat closes  ? ? ?OBJECTIVE: ?Blood pressure 124/84, pulse 93, temperature  98.1 ?F (36.7 ?C), temperature source Oral, resp. rate 10, height 5\' 7"  (1.702 m), weight 86.4 kg, SpO2 98 %. ? ?Physical Exam ?Constitutional:   ?   Appearance: She is ill-appearing.  ?HENT:  ?   Head: Normocephalic and atraumatic.  ?Eyes:  ?   Extraocular Movements: Extraocular movements intact.  ?Cardiovascular:  ?   Rate and Rhythm: Normal rate.  ?   Pulses: Normal pulses.  ?   Heart sounds: Normal heart sounds.  ?Pulmonary:  ?   Effort: Pulmonary effort is normal.  ?   Breath sounds: Normal breath sounds.  ?Abdominal:  ?   General: There is distension.  ?   Tenderness: There is abdominal tenderness.  ?Musculoskeletal:     ?   General: Normal range of motion.  ?   Cervical back: Normal range of motion.  ?Skin: ?   General: Skin is warm and dry.  ?Neurological:  ?   General: No focal deficit present.  ?   Mental Status: She is alert.  ? ? ?Lab Results ?Lab Results  ?Component Value Date  ? WBC 12.9 (H) 09/01/2021  ? HGB 9.8 (L) 09/01/2021  ? HCT 30.2 (L) 09/01/2021  ? MCV 94.7 09/01/2021  ? PLT 556 (H) 09/01/2021  ?  ?Lab Results  ?Component Value Date  ? CREATININE 0.32 (L) 09/01/2021  ? BUN 6 09/01/2021  ? NA 136 09/01/2021  ? K 3.8 09/01/2021  ? CL 105 09/01/2021  ? CO2 24 09/01/2021  ?  ?Lab Results  ?Component Value Date  ? ALT 107 (H) 08/26/2021  ? AST 76 (H) 08/26/2021  ? ALKPHOS 104 08/26/2021  ? BILITOT 0.6 08/26/2021  ?  ? ?Microbiology: ?Recent Results (from the past 240 hour(s))  ?Culture, blood (routine x 2)     Status: None  ? Collection Time: 08/27/21 12:39 PM  ? Specimen: BLOOD  ?Result Value Ref Range Status  ? Specimen Description   Final  ?  BLOOD BLOOD RIGHT FOREARM ?Performed at [facility], [address] [place], [place] ?  ? Special Requests   Final  ?  BOTTLES DRAWN AEROBIC ONLY Blood Culture results may not be optimal due to an inadequate volume of blood received in culture bottles ?Performed at [facility], [address]  [place], [place] ?  ? Culture   Final  ?  NO GROWTH 5 DAYS ?Performed at [facility], [address], [place], [place] ?  ? Report Status 09/01/2021 FINAL  Final  ?Culture, blood (routine x 2)     Status: None  ? Collection Time: 08/27/21 12:39 PM  ? Specimen: BLOOD  ?Result Value Ref Range Status  ? Specimen Description   Final  ?  BLOOD BLOOD RIGHT HAND ?Performed at [facility], [address], [place], [place] ?  ? Special Requests   Final  ?  BOTTLES DRAWN AEROBIC ONLY Blood Culture adequate volume ?Performed at [facility], [address] [place], [place] ?  ? Culture  Final  ?  NO GROWTH 5 DAYS ?Performed at [facility], [address] [place], [place] [address] ?  ? Report Status 09/01/2021 FINAL  Final  ?Aerobic/Anaerobic Culture w Gram Stain (surgical/deep wound)     Status: None (Preliminary result)  ? Collection Time: 08/28/21  1:42 PM  ? Specimen: Wound; Abscess  ?Result Value Ref Range Status  ? Specimen Description WOUND  Final  ? Special Requests LUMBAR EPIDURAL ABSCESS SPEC A  Final  ? Gram Stain   Final  ?  NO WBC SEEN ?RARE GRAM POSITIVE COCCI ?Performed at [facility], [address] [place], [place] [address] ?  ? Culture   Final  ?  FEW METHICILLIN RESISTANT STAPHYLOCOCCUS AUREUS ?NO ANAEROBES ISOLATED; CULTURE IN PROGRESS FOR 5 DAYS ?  ? Report Status PENDING  Incomplete  ? Organism ID, Bacteria METHICILLIN RESISTANT STAPHYLOCOCCUS AUREUS  Final  ?    Susceptibility  ? Methicillin resistant staphylococcus aureus - MIC*  ?  CIPROFLOXACIN >=8 RESISTANT Resistant   ?  ERYTHROMYCIN >=8 RESISTANT Resistant   ?  GENTAMICIN <=0.5 SENSITIVE Sensitive   ?  OXACILLIN >=4 RESISTANT Resistant   ?  TETRACYCLINE <=1 SENSITIVE Sensitive   ?  VANCOMYCIN 1 SENSITIVE Sensitive   ?  TRIMETH/SULFA >=320 RESISTANT Resistant   ?  CLINDAMYCIN >=8 RESISTANT Resistant   ?  RIFAMPIN <=0.5 SENSITIVE Sensitive   ?   Inducible Clindamycin NEGATIVE Sensitive   ?  * FEW METHICILLIN RESISTANT STAPHYLOCOCCUS AUREUS  ?Body fluid culture w Gram Stain     Status: None  ? Collection Time: 08/29/21  2:54 PM  ? Specimen: Pleural Fluid  ?Re

## 2021-09-01 NOTE — Progress Notes (Signed)
? ?[facility] ?PROGRESS NOTE ? ? ?Kimberly Booker FHQ:[id number] DOB: [birth year] DOA: 08/26/2021  6 ?DOS: the patient was seen and examined on 09/01/2021 ? ?PCP: Pcp, No ? ?Brief History and Hospital Course:  ?36 year old with past medical history significant for anxiety, depression, suicidal attempt drug overdose, polysubstance abuse, right clavicle fracture, was admitted at [facility] on 08/15/2021 due to right shoulder abscess.She was treated with I&D in the emergency department followed by IV antibiotics therapy as an inpatient.  She was diagnosed with MRSA bacteremia, endocarditis was ruled out with a TEE performed 3/16, but she continued to have positive blood cultures despite treatment with ceftriaxone and vancomycin.  Antibiotic therapy was changed to daptomycin.  She was then transferred to [facility]. ?Extensive imagings have been done here which showed right clavicle osteomyelitis/phlegmon/abscess, widespread cervical, thoracic, lumbar discitis/osteomyelitis/epidural abscess/cord impingement.  MRI also showed bilateral iliopsoas abscess, right complex pleural effusion.  ID, orthopedics, neurosurgery following. ?Underwent lumbar laminectomy/debridement of abscess, L4-L5 on 3/25. Hospital course also remarkable for finding of right-sided empyema, gastric perforation. Status post emergent laparotomy and perforation of repair by general surgery.  PCCM also following for chest tube on the right side. ? ?Consultants: Infectious disease, neurosurgery, orthopedics, interventional radiology.  General surgery ? ?Procedures:   ? ?3/27: Diagnostic laparoscopy, exploratory laparotomy, Cheree Ditto patch repair of gastric perforation ? ?3/25: L4-L5 laminectomy, debridement of epidural abscess ? ? ?Subjective: ?Patient complains of abdominal pain but denies any nausea or vomiting.  No chest pain or shortness of breath ? ? ? ?Assessment/Plan: ? ? ?* MRSA bacteremia ?-Patient likely has disseminated MRSA  bacteremia.  ?-She had TEE outside facility on 3/16, negative for endocarditis as per the report  ?-TTE did not show any vegetation, might need TEE here but has been delayed due emergent surgery for gastric perforation. ?ID is following.  Patient noted to be on vancomycin currently. ? ?Gastric perforation (HCC) ?Complained of abdominal pain on 3/26.  Chest x-ray done after chest tube placement showed incidental finding of pneumoperitoneum.  CT abdomen/pelvis confirmed pneumoperitoneum.  General surgery consulted and she underwent emergent exploratory laparatomy with finding of gastric perforation.  ?Management per general surgery.  Remains on PPI.  ? ?Abscess in epidural space of cervical spine ?Discitis/osteomyelitis at C2-3, C5-6, and C6-7. Ventral epidural abscess at C2-3, C5, and C6 with cord impingement especially at the lower 2 levels. Facet arthritis on the left at C7-T1 and right at C3-4. ?-Also showed discitis/osteomyelitis of T9- T10, T11-T12 ?-Neurosurgery following. ? 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 Bilateral facet arthritis at L5-S1. Dorsal epidural abscess at L4 and L5,highly compressive on the thecal sac. ?S/P  lumbar laminectomy, debridement of abscess, L4-L5 on 3/25. ?No further interventions planned by neurosurgery.  PT and OT. ? ?Iliopsoas abscess (HCC) ?Pelvic MRI showed bilateral infectious sacroiliitis and osteomyelitis of the sacrum and periarticular right iliac bone. Bilateral psoas abscesses, Small bilateral paraspinal muscle abscesses, intramuscular abscess within the left piriformis muscle, small intramuscular abscess within the left gluteus medius muscle. ?The plan is to continue antibiotics for these. ?IR also consulted to check if we can drain the abscess. ? ? ?Pleural effusion ?Chest CT showed a large loculated right pleural effusion concerning for empyema, subcentimeter groundglass nodularity  within the bilateral lungs concerning for septic emboli.  ?S/P chest tube placement.  PCCM following. ?Fluid noted to be exudative.  No need for DNase/tPA.  Management per PCCM. ? ?Normocytic anemia ?Hemoglobin low but  stable.   ?Received 2 units PRBC outside facility.  ? ?Anxiety with depression ?Takes Prozac and Atarax.  Currently n.p.o. so continue IV lorazepam as needed ? ?Acute osteomyelitis of right clavicle (HCC) ?MRI of the shoulder showed osteomyelitis, phlegmon, possible abscess.  Orthopedics following and planning for shoulder washout. ? ? ?Polysubstance abuse (HCC) ?She declined IV drug abuse recently, last drug use was 2 years ago as per patient. ? ?Transaminitis ?HCV Positive ? ?Noted at the time of admission.  Will recheck tomorrow. 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 ? ? ? ? ? ?DVT Prophylaxis: Lovenox ?Code Status: Full code ?Family Communication: Discussed with patient ?Disposition Plan: To be determined ? ?Status is: Inpatient ?Remains inpatient appropriate because: IV antibiotics.  Need for surgical interventions ? ? ? ? ?Medications: Scheduled: ? Chlorhexidine Gluconate Cloth  6 each Topical Q0600  ? enoxaparin (LOVENOX) injection  40 mg Subcutaneous Q24H  ? fentaNYL  1 patch Transdermal Q72H  ? FLUoxetine  40 mg Oral Daily  ? lidocaine  1 patch Transdermal Q24H  ? mouth rinse  15 mL Mouth Rinse BID  ? mupirocin ointment  1 application. Nasal BID  ? nicotine  21 mg Transdermal Daily  ? pantoprazole  40 mg Intravenous Q12H  ? sodium chloride flush  10 mL Other Q8H  ? ?Continuous: ? acetaminophen 1,000 mg (08/31/21 1813)  ? lactated ringers 75 mL/hr at 08/31/21 0820  ? methocarbamol (ROBAXIN) IV 1,000 mg (09/01/21 0541)  ? vancomycin 1,250 mg (08/31/21 2141)  ? ?ZOX:[id number]:acetaminophen, HYDROmorphone (DILAUDID) injection, LORazepam, [DISCONTINUED] ondansetron **OR** ondansetron (ZOFRAN) IV, phenol ? ?Antibiotics: ?Anti-infectives (From admission, onward)  ? ? Start     Dose/Rate Route Frequency Ordered  Stop  ? 08/30/21 1000  metroNIDAZOLE (FLAGYL) IVPB 500 mg  Status:  Discontinued       ? 500 mg ?100 mL/hr over 60 Minutes Intravenous Every 12 hours 08/30/21 0036 08/30/21 1708  ? 08/30/21 0600  metroNIDAZOLE (FLAGYL) IVPB 500 mg  Status:  Discontinued       ? 500 mg ?100 mL/hr over 60 Minutes Intravenous On call to O.R. 08/30/21 0220 08/30/21 0229  ? 08/30/21 0245  ciprofloxacin (CIPRO) IVPB 400 mg  Status:  Discontinued       ? 400 mg ?200 mL/hr over 60 Minutes Intravenous Every 12 hours 08/30/21 0037 08/30/21 1708  ? 08/30/21 0045  cefTRIAXone (ROCEPHIN) 2 g in sodium chloride 0.9 % 100 mL IVPB  Status:  Discontinued       ? 2 g ?200 mL/hr over 30 Minutes Intravenous Every 24 hours 08/30/21 0036 08/30/21 0036  ? 08/29/21 2315  metroNIDAZOLE (FLAGYL) IVPB 500 mg       ?Note to Pharmacy: TO MAIN OR STAT!! In surgery now  ? 500 mg ?100 mL/hr over 60 Minutes Intravenous To Surgery 08/29/21 2302 08/29/21 2319  ? 08/29/21 2200  vancomycin (VANCOREADY) IVPB 1250 mg/250 mL       ? 1,250 mg ?166.7 mL/hr over 90 Minutes Intravenous Every 12 hours 08/29/21 1857    ? 08/29/21 2100  Vancomycin (VANCOCIN) 1,250 mg in sodium chloride 0.9 % 250 mL IVPB  Status:  Discontinued       ? 1,250 mg ?166.7 mL/hr over 90 Minutes Intravenous Every 12 hours 08/29/21 0837 08/29/21 1858  ? 08/29/21 0930  vancomycin (VANCOCIN) 2,000 mg in sodium chloride 0.9 % 500 mL IVPB       ? 2,000 mg ?260 mL/hr over 120 Minutes Intravenous  Once 08/29/21 0837 08/29/21 1257  ?  08/26/21 2000  cefTRIAXone (ROCEPHIN) 2 g in sodium chloride 0.9 % 100 mL IVPB  Status:  Discontinued       ? 2 g ?200 mL/hr over 30 Minutes Intravenous Every 24 hours 08/26/21 1614 08/28/21 1736  ? 08/26/21 2000  DAPTOmycin (CUBICIN) 700 mg in sodium chloride 0.9 % IVPB  Status:  Discontinued       ? 8 mg/kg ? 87.2 kg ?128 mL/hr over 30 Minutes Intravenous Daily 08/26/21 1819 08/29/21 0842  ? ?  ? ? ?Objective: ? ?Vital Signs ? ?Vitals:  ? 08/31/21 2022 09/01/21 0000 09/01/21 0500  09/01/21 0726  ?BP: 123/79 118/76 109/87 124/84  ?Pulse: 81 86 93 93  ?Resp: 18 14 (!) 22 10  ?Temp: 98.5 ?F (36.9 ?C) 98.2 ?F (36.8 ?C) 98 ?F (36.7 ?C) 98.1 ?F (36.7 ?C)  ?TempSrc: Oral Oral Oral Oral  ?Sp

## 2021-09-01 NOTE — Consult Note (Signed)
? ?Chief Complaint: ?Patient was seen in consultation today for iliopsoas abscesses ? ?Referring Physician(s): ?Dr. Shelly Coss ? ?Supervising Physician: Daryll Brod ? ?Patient Status: [facility] - In-pt ? ?History of Present Illness: ?Kimberly Booker is a 36 y.o. female with past medical history of anxiety, depression, polysubstance abuse, s/p fall with clavical fracture 8 months ago.  She was lost to follow-up until 1 month ago when she developed acute pain and swelling along the right clavicle.  She attempted to decompress this herself, however ultimately required admission to [facility] for MRSA sepsis with acute kidney injury. She was transferred to [facility] for ongoing management of her osteomyelitis. CT CAP showed disseminated infection with multiple foci. She is now s/p L4 and L5 laminectomy for spinal epidural abscesses. Right shoulder debridement was planned for this week, however patient developed acute empyema s/p chest tube placement 3/26.  A subsequent CXR also shows new pneumoperitoneum and she underwent ex lap for perforated gastric ulcer. Her shoulder debridement has been rescheduled for Friday.  IR consulted in the interim for source control of her iliopsoas muscle abscesses.  ? ?Imaging from 3/26 reviewed by Dr. Annamaria Boots who recommended repeat imaging.  Repeat CT Abdomen Pelvis obtained overnight and shows essentially unchanged, multilocuated collections.  Dr. Annamaria Boots has approved for aspiration and drainage of bilateral fluid collections.  ? ?Review of chart shows patient received lovenox injection at 9AM today.  ? ?Past Medical History:  ?Diagnosis Date  ? Anxiety with depression 08/26/2021  ? Polysubstance abuse ([place]) 08/26/2021  ? Suicide attempt by drug overdose ([place]) 08/26/2021  ? ? ?Past Surgical History:  ?Procedure Laterality Date  ? CESAREAN SECTION    ? LAPAROSCOPY N/A 08/29/2021  ? Procedure: LAPAROSCOPY DIAGNOSTIC, EXPLORATORY LAPAROTOMY; REPAIR OF GASTRIC ULCER;  Surgeon: Dwan Bolt, MD;  Location: [place];  Service: General;  Laterality: N/A;  ? LAPAROTOMY  08/29/2021  ? Procedure: EXPLORATORY LAPAROTOMY;  Surgeon: Dwan Bolt, MD;  Location: [place];  Service: General;;  ? LUMBAR LAMINECTOMY/DECOMPRESSION MICRODISCECTOMY N/A 08/28/2021  ? Procedure: LUMBAR LAMINECTOMY WITH DEBRIDEMENT OF ABSCESS Lumbar four - five;  Surgeon: Consuella Lose, MD;  Location: [place];  Service: Neurosurgery;  Laterality: N/A;  ? THORACENTESIS Right 08/29/2021  ? Procedure: THORACENTESIS;  Surgeon: Collene Gobble, MD;  Location: [facility] ENDOSCOPY;  Service: Cardiopulmonary;  Laterality: Right;  ? ? ?Allergies: ?Amoxicillin ? ?Medications: ?Prior to Admission medications   ?Medication Sig Start Date End Date Taking? Authorizing Provider  ?ibuprofen (ADVIL) 200 MG tablet Take 800 mg by mouth every 6 (six) hours as needed for moderate pain.   Yes [provider]  ?FLUoxetine (PROZAC) 40 MG capsule Take 40 mg by mouth daily.    [provider]  ?  ? ?Family History  ?Problem Relation Age of Onset  ? Hypertension Other   ? ? ?Social History  ? ?Socioeconomic History  ? Marital status: Legally Separated  ?  Spouse name: Not on file  ? Number of children: Not on file  ? Years of education: Not on file  ? Highest education level: Not on file  ?Occupational History  ? Not on file  ?Tobacco Use  ? Smoking status: Every Day  ?  Types: Cigarettes  ? Smokeless tobacco: Never  ?Substance and Sexual Activity  ? Alcohol use: Not Currently  ? Drug use: Yes  ?  Types: Methamphetamines, Heroin  ? Sexual activity: Not on file  ?Other Topics Concern  ? Not on file  ?Social History  Narrative  ? Not on file  ? ?Social Determinants of Health  ? ?Financial Resource Strain: Not on file  ?Food Insecurity: Not on file  ?Transportation Needs: Not on file  ?Physical Activity: Not on file  ?Stress: Not on file  ?Social Connections: Not on file  ? ? ? ?Review of Systems: A 12 point ROS discussed and pertinent positives are  indicated in the HPI above.  All other systems are negative. ? ?Review of Systems  ?Constitutional:  Negative for fatigue and fever.  ?Respiratory:  Negative for cough and shortness of breath.   ?Cardiovascular:  Negative for chest pain.  ?Gastrointestinal:  Negative for abdominal pain and diarrhea.  ?Musculoskeletal:  Positive for joint swelling. Negative for back pain.  ?Psychiatric/Behavioral:  Negative for behavioral problems and confusion.   ? ?Vital Signs: ?BP 130/82 (BP Location: Left Arm)   Pulse 85   Temp 98 ?F (36.7 ?C) (Oral)   Resp 18   Ht 5\' 7"  (1.702 m)   Wt 190 lb 7.6 oz (86.4 kg)   LMP  (LMP Unknown) Comment: pregnancy test neg.  SpO2 99%   BMI 29.83 kg/m?  ? ?Physical Exam ?Vitals and nursing note reviewed.  ?Constitutional:   ?   General: She is not in acute distress. ?   Appearance: Normal appearance. She is not ill-appearing.  ?HENT:  ?   Mouth/Throat:  ?   Mouth: Mucous membranes are moist.  ?   Pharynx: Oropharynx is clear.  ?Cardiovascular:  ?   Rate and Rhythm: Normal rate and regular rhythm.  ?Pulmonary:  ?   Effort: Pulmonary effort is normal.  ?   Breath sounds: Normal breath sounds.  ?Abdominal:  ?   General: Abdomen is flat.  ?   Palpations: Abdomen is soft.  ?Neurological:  ?   General: No focal deficit present.  ?   Mental Status: She is alert and oriented to person, place, and time.  ?Psychiatric:     ?   Mood and Affect: Mood normal.     ?   Behavior: Behavior normal.     ?   Thought Content: Thought content normal.     ?   Judgment: Judgment normal.  ? ? ? ?MD Evaluation ?Airway: WNL ?Heart: WNL ?Abdomen: WNL ?Chest/ Lungs: WNL ?ASA  Classification: 3 ?Mallampati/Airway Score: Two ? ? ?Imaging: ?DG THORACOLUMABAR SPINE ? ?Result Date: 08/27/2021 ?CLINICAL DATA:  Mid to lower back pain with numbness in lower extremities. EXAM: THORACOLUMBAR SPINE 1V COMPARISON:  MRI of same date FINDINGS: AP views image from approximately the top of T5 through the sacrum. Sacroiliac joints are  symmetric. Lateral views image from approximately the top of T7 through the upper sacrum. Maintenance of vertebral body height across these levels. Loss of intervertebral disc height at multiple lower thoracic levels. Intervertebral disc height loss at the lumbosacral junction, presumably secondary to discitis/osteomyelitis when correlated with today's MRI. IMPRESSION: Maintenance of thoracolumbar vertebral body height loss. Intervertebral disc height loss at L5-S1 and multiple lower thoracic levels. Please see today's MRI for more definitive delineation of discitis/osteomyelitis within the lower thoracic spine and lumbosacral junction. Electronically Signed   By: Abigail Miyamoto M.D.   On: 08/27/2021 12:28  ? ?DG Lumbar Spine 2-3 Views ? ?Result Date: 08/28/2021 ?CLINICAL DATA:  Intraoperative localization EXAM: LUMBAR SPINE - 2-3 VIEW COMPARISON:  08/27/2021 FINDINGS: Two cross-table lateral intraoperative exam is are obtained. On the first image obtained at 1:14 p.m., radiopaque instrument is seen posterior to the  L4 vertebral body. On the second image obtained at 1:20 p.m., the instrument is posterior to the L5 vertebral body. Irregularity along the L5-S1 disc space is noted, consistent with history of discitis/osteomyelitis. IMPRESSION: 1. Intraoperative localization films as above. Electronically Signed   By: Randa Ngo M.D.   On: 08/28/2021 16:17  ? ?DG Shoulder Right ? ?Result Date: 08/26/2021 ?CLINICAL DATA:  History of prior clavicular fracture and right shoulder abscess, initial encounter EXAM: RIGHT SHOULDER - 2+ VIEW COMPARISON:  None. FINDINGS: Midshaft right clavicular fracture is noted with downward displacement of the distal fracture fragment. Increased density is noted over the shoulder consistent with soft tissue dressing. Some air is noted in the supraclavicular region consistent with the known history of abscess. Humeral head is well seated. Generalized increased density is noted in the  visualized lung fields likely related to poor inspiratory effort. IMPRESSION: Right clavicular fracture with soft tissue changes consistent with the given clinical history of abscess. Electronically Signed   By: Sheralyn Boatman

## 2021-09-01 NOTE — Progress Notes (Addendum)
? ?NAME:  Kimberly Booker, MRN:  [id number], DOB:  22-May-1986, LOS: 6 ?ADMISSION DATE:  08/26/2021, CONSULTATION DATE: 3/26 ?REFERRING MD: Dr. Renford Dills, CHIEF COMPLAINT: Shoulder pain ? ?History of Present Illness:  ?36 year old female who presented to [facility] from [facility] with reports of right shoulder pain. ? ?The patient reportedly suffered a right clavicle navicular fracture approximately 8 months prior to admission.  She has known history of IV drug abuse.  She reported increasing redness and pain in the right shoulder for approximately 2 weeks prior to admission to [facility] (08/15/21).  The patient had taken a razor blade to open the abscess for drainage.  She was prescribed clindamycin.  Despite antibiotics she continued to feel poorly and was admitted on 3/12 to [facility].  She was found to have MRSA bacteremia and right shoulder abscess.  She had a TTE that was not suggestive of endocarditis but CT of the chest showed possible septic emboli.  Shoulder imaging was concerning for possible osteosarcoma versus phlegmon and the patient was transferred to [facility] for orthopedic services.  She was treated with ceftriaxone, vancomycin and later transitioned to daptomycin.  Despite antibiotics she continued to have positive blood cultures.   ? ?Imaging of her spine revealed an L5-S1 discitis with osteomyelitis, bilateral SI septic arthritis.  In addition she had bilateral iliopsoas abscess and dorsal epidural abscess at L4-L5 with compression of thecal sac.  Right shoulder showed an ill-defined fluid and phlegmon that appears to drain to the surface of the skin anteriorly on MRI.  Patient was seen by neurosurgery and was taken on 3/25 for L4-L5 laminectomy and debridement of epidural abscess.  Patient reports significant relief from pain after debridement.  CT of the chest on 3/25 demonstrates a large loculated right pleural effusion, destructive process of the right mid clavicle,  subcentimeter groundglass nodularity within bilateral lungs concerning for septic emboli. ? ?PCCM consulted for evaluation of pleural effusion. ? ?Pertinent  Medical History  ?Depression ?Anxiety ?Suicide attempts ?Polysubstance abuse ?Severe protein calorie malnutrition ?MRSA bacteremia ? ?Significant Hospital Events: ?Including procedures, antibiotic start and stop dates in addition to other pertinent events   ?3/23 admit to [facility] from [facility] with MRSA bacteremia, osteomyelitis/septic arthritis ?3/25 2 OR for laminectomy and debridement of epidural abscess.  CT chest with large right pleural effusion, concern for septic emboli bilaterally  ?3/26 PCCM consulted for evaluation of pleural effusion, chest tube placed ?3/27 Developed perf gastric ulcer and had emergency ex lap ?3/29 complete resolution of right effusion and residual right upper lobe consolidation.  ? ?Interim History / Subjective:  ? ?No acute events overnight. 36ml out over last 12h ? ?Objective   ?Blood pressure 130/82, pulse 85, temperature 98 ?F (36.7 ?C), temperature source Oral, resp. rate 18, height 5\' 7"  (1.702 m), weight 86.4 kg, SpO2 99 %. ?   ?   ? ?Intake/Output Summary (Last 24 hours) at 09/01/2021 1436 ?Last data filed at 09/01/2021 1434 ?Gross per 24 hour  ?Intake 1270.18 ml  ?Output 2390 ml  ?Net -1119.82 ml  ? ? ?Filed Weights  ? 08/28/21 0500 08/28/21 1059 08/29/21 1417  ?Weight: 86.4 kg 86.4 kg 86.4 kg  ? ? ?Examination: ?Gen:      No acute distress, chronically ill appearing ?HEENT:  EOMI, sclera anicteric ?Neck:     No masses; no thyromegaly ?Lungs:    Clear to auscultation bilaterally; normal respiratory effort ?CV:         Regular rate and rhythm; no murmurs ?  Abd:      + bowel sounds; soft, non-tender; no palpable masses, no distension ?Ext:    No edema; adequate peripheral perfusion ?Skin:      Warm and dry; no rash ?Neuro: alert and oriented x 3 ?Psych: normal mood and affect  ? ?Resolved Hospital Problem list    ? ? ?Assessment & Plan:  ?MRSA Bacteremia ?Septic Arthritis, Epidural Abscess, Right Shoulder Osteoarthritis ?Large Right Pleural Effusion s/p chest tube placement on 3/26  ?Suspected Septic Emboli ?IVDA  ? ?Fluid is exudative and is completely drained on CXR and CT abd. No need for DNAse, TPA ?Chest tube removal ordered.  ?Vancomycin per ID team. ? ?PCCM will sign off. Please re-consult if need arises.  ? ?Best Practice (right click and "Reselect all SmartList Selections" daily)  ?Per primary  ? ?Critical care time: NA  ? ?Lynnell Catalan, MD FRCPC ?ICU Physician ?[facility]  ?Pager: [phone] ?Or Epic Secure Chat ?After hours: [phone]. ? ?09/01/2021, 2:38 PM ? ? ? ? ?09/01/2021, 2:36 PM  ? ? ? ?

## 2021-09-01 NOTE — Progress Notes (Signed)
Physical Therapy Treatment ?Patient Details ?Name: Kimberly Booker ?MRN: [id number] ?DOB: 08-05-1985 ?Today's Date: 09/01/2021 ? ? ?History of Present Illness 36 year old with past medical history significant for anxiety, depression, suicidal attempt drug overdose, polysubstance abuse and right clavicle fracture 8 months ago.  She reported having occasional pain but a week ago and  was admitted at [facility] on 08/15/2021 due to right shoulder abscess and underwent I&D in the ED started IV antibiotics. Found to have MRSA bacteremia and TEE ruled out endocarditis.  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  Underwent lumbar laminectomy / debridement of abscess, L4-L5 on 3/25.  Developed right-sided empyema and gastric perforation.  Status post emergent laparotomy and perforation of repair by general surgery on 3/27.  PCCM also following for chest tube on the right side. ? ?  ?PT Comments  ? ? Patient progressing this session OOB to chair but still with significant pain in abdomen.  She was fatigued already, but just had pain meds per RN.  Encouraged OOB at least 30-45 minutes and RN made aware how to assist back to bed.  Patient remains appropriate for acute inpatient rehab prior to d/c home.  PT will continue to follow acutely.   ?Recommendations for follow up therapy are one component of a multi-disciplinary discharge planning process, led by the attending physician.  Recommendations may be updated based on patient status, additional functional criteria and insurance authorization. ? ?Follow Up Recommendations ? Acute inpatient rehab (3hours/day) ?  ?  ?Assistance Recommended at Discharge Frequent or constant Supervision/Assistance  ?Patient can return home with the following Two people to help with walking  and/or transfers;A lot of help with bathing/dressing/bathroom;Assistance with cooking/housework;Direct supervision/assist for medications management;Assist for transportation;Help with stairs or ramp for entrance;Direct supervision/assist for financial management ?  ?Equipment Recommendations ? Rolling walker (2 wheels)  ?  ?Recommendations for Other Services   ? ? ?  ?Precautions / Restrictions Precautions ?Precautions: Fall;Back ?Precaution Comments: R chest tube to suction, L JP drain, NGT to suction ?Restrictions ?Weight Bearing Restrictions: No  ?  ? ?Mobility ? Bed Mobility ?Overal bed mobility: Needs Assistance ?Bed Mobility: Rolling, Sidelying to Sit ?Rolling: Min assist ?Sidelying to sit: Mod assist ?  ?  ?  ?General bed mobility comments: assist for guiding legs off bed and to lift trunk ?  ? ?Transfers ?Overall transfer level: Needs assistance ?Equipment used: Rolling walker (2 wheels) ?Transfers: Sit to/from Stand ?Sit to Stand: Mod assist, +2 safety/equipment, From elevated surface ?  ?Step pivot transfers: Mod assist, +2 safety/equipment ?  ?  ?  ?General transfer comment: cues for technique, pt working through pain ?  ? ?Ambulation/Gait ?  ?  ?  ?  ?  ?  ?  ?  ? ? ?Stairs ?  ?  ?  ?  ?  ? ? ?Wheelchair Mobility ?  ? ?Modified Rankin (Stroke Patients Only) ?  ? ? ?  ?Balance Overall balance assessment: Needs assistance ?Sitting-balance support: Feet supported ?Sitting balance-Leahy Scale: Poor ?Sitting balance - Comments: sitting wtih UE support and close S ?  ?Standing balance support: Bilateral upper extremity supported ?Standing balance-Leahy Scale: Poor ?Standing balance comment: UE support on walker and min A of 2 for safety ?  ?  ?  ?  ?  ?  ?  ?  ?  ?  ?  ?  ? ?  ?  Cognition Arousal/Alertness: Awake/alert ?Behavior During Therapy: Anxious ?Overall Cognitive Status: Within Functional Limits for tasks assessed ?  ?  ?  ?  ?  ?  ?  ?  ?  ?  ?  ?  ?  ?  ?  ?  ?  ?  ?  ? ?  ?Exercises Other  Exercises ?Other Exercises: AP's in chair ?Other Exercises: incentive spirometer x 6 reps up to ? ?  ?General Comments General comments (skin integrity, edema, etc.): VSS on RA ?  ?  ? ?Pertinent Vitals/Pain Pain Assessment ?Pain Assessment: Faces ?Faces Pain Scale: Hurts whole lot ?Pain Location: back and abdomen, generalized ?Pain Descriptors / Indicators: Aching, Guarding, Discomfort ?Pain Intervention(s): Monitored during session, Repositioned, Premedicated before session  ? ? ?Home Living Family/patient expects to be discharged to:: Private residence ?Living Arrangements: Other relatives (lives with sister) ?Available Help at Discharge: Family;Available PRN/intermittently ?Type of Home: House ?Home Access: Stairs to enter ?Entrance Stairs-Rails: Left;Right;Can reach both ?Entrance Stairs-Number of Steps: 8 ?Alternate Level Stairs-Number of Steps: flight ?Home Layout: Two level;Bed/bath upstairs;Able to live on main level with bedroom/bathroom ?Home Equipment: Agricultural consultant (2 wheels);Cane - single point;BSC/3in1 ?Additional Comments: just has DME, does not use  ?  ?Prior Function    ?  ?  ?   ? ?PT Goals (current goals can now be found in the care plan section) Progress towards PT goals: Progressing toward goals ? ?  ?Frequency ? ? ? Min 3X/week ? ? ? ?  ?PT Plan Current plan remains appropriate  ? ? ?Co-evaluation   ?  ?  ?  ?  ? ?  ?AM-PAC PT "6 Clicks" Mobility   ?Outcome Measure ? Help needed turning from your back to your side while in a flat bed without using bedrails?: A Lot ?Help needed moving from lying on your back to sitting on the side of a flat bed without using bedrails?: A Lot ?Help needed moving to and from a bed to a chair (including a wheelchair)?: Total ?Help needed standing up from a chair using your arms (e.g., wheelchair or bedside chair)?: Total ?Help needed to walk in hospital room?: Total ?Help needed climbing 3-5 steps with a railing? : Total ?6 Click Score: 8 ? ?  ?End of  Session   ?Activity Tolerance: Patient limited by pain ?Patient left: in chair;with call bell/phone within reach;with chair alarm set ?Nurse Communication: Mobility status ?PT Visit Diagnosis: Other abnormalities of gait and mobility (R26.89);Muscle weakness (generalized) (M62.81);Pain ?Pain - Right/Left: Right ?Pain - part of body: Shoulder (and abdomen) ?  ? ? ?Time: 1610-9604 ?PT Time Calculation (min) (ACUTE ONLY): 20 min ? ?Charges:  $Therapeutic Activity: 8-22 mins          ?          ? ?Sheran Lawless, PT ?Acute Rehabilitation Services ?[phone] ?Office:[phone] ?09/01/2021 ? ? ? ?Elray Mcgregor ?09/01/2021, 10:52 AM ? ?

## 2021-09-01 NOTE — Progress Notes (Signed)
Pharmacy Antibiotic Note ? ?LEISL SPURRIER is a 36 y.o. female admitted on 08/26/2021 with  disseminated MRSA bacteremia (diffuse discitis/osteomyelitis/epidural abscess of cervical, thoracic and lumbar spine, bilateral septic arthritis, osteomyelitis of R clavicle, R lung empyema) .  ? ?Patient was initially started on vancomycin 3/13-3/15 along with ceftriaxone 3/13-3/24. Vancomycin was transitioned to daptomycin and given 3/16-3/25. Pt was transferred to [facility] on 08/26/21. ID has consulted pharmacy dose vancomycin for coverage of MRSA R lung empyema. VP 20, VT 8. ? ?Plan: ?Increase to vancomycin 1750mg  IV q12h (eAUC ~499) ?Goal AUC 400-550 ?Check CBC and BMET daily ?Monitor renal function, WBC, temp, and clinical signs of infection daily.  ? ?Height: 5\' 7"  (170.2 cm) ?Weight: 86.4 kg (190 lb 7.6 oz) ?IBW/kg (Calculated) : 61.6 ? ?Temp (24hrs), Avg:98.4 ?F (36.9 ?C), Min:98 ?F (36.7 ?C), Max:99.4 ?F (37.4 ?C) ? ?Recent Labs  ?Lab 08/27/21 ?1237 08/29/21 ?08/29/21 08/29/21 ?2153 08/31/21 ?0246 09/01/21 ?0117 09/01/21 ?09/03/21  ?WBC 13.5* 15.8* 18.3* 12.9* 12.9*  --   ?CREATININE 0.32* 0.34* 0.34* 0.39* 0.32*  --   ?VANCOTROUGH  --   --   --   --   --  8*  ?VANCOPEAK  --   --   --   --  20*  --   ? ?  ?Estimated Creatinine Clearance: 109.7 mL/min (A) (by C-G formula based on SCr of 0.32 mg/dL (L)).   ? ?Allergies  ?Allergen Reactions  ? Amoxicillin Swelling  ?  Throat closes  ? ? ?Antimicrobials this admission: ?Vanc 3/13 >> 3/15 ?Ceftriaxone 3/13 >> 3/24 ?Daptomycin 3/16 >> 3/25 ?Vanc 3/26 >> ? ?Microbiology results: ?3/12 Bcx x2: 4/4 MRSA ?3/12 Chest wound cx: MRSA ?3/14 Bcx: 4/4 MRSA ?3/22 Bcx: ngtd x3d ?3/24 Bcx: ngtd x2d ?3/25 lumbar epidural wound/abscess: MRSA ? ? ?Thank you for allowing pharmacy to be a part of this patient?s care. ? ?4/24, PharmD ?PGY1 Pharmacy Resident ? ?Please check AMION for all [facility] pharmacy phone numbers ?After 10:00 PM call main pharmacy [phone] ? ? ?

## 2021-09-01 NOTE — Progress Notes (Signed)
[facility] ?Progress Note ? ?3 Days Post-Op  ?Subjective: ?Having some abdominal pain as expected.  1 episode of flatus.  No BM.  NGT with minimal output ? ?Objective: ?Vital signs in last 24 hours: ?Temp:  [98 ?F (36.7 ?C)-99.4 ?F (37.4 ?C)] 98.1 ?F (36.7 ?C) (03/29 0726) ?Pulse Rate:  [70-93] 93 (03/29 0726) ?Resp:  [10-24] 10 (03/29 0726) ?BP: (109-124)/(75-87) 124/84 (03/29 0726) ?SpO2:  [94 %-98 %] 98 % (03/29 0726) ?Last BM Date : 08/25/21 ? ?Intake/Output from previous day: ?03/28 0701 - 03/29 0700 ?In: 1250.2 [I.V.:510; IV Piggyback:730.2] ?Out: 2930 [Urine:2250; Emesis/NG output:150; Drains:305; Chest Tube:225] ?Intake/Output this shift: ?Total I/O ?In: -  ?Out: 50 [Drains:50] ? ?PE: ?Abd: Soft, appropriately tender, midline incision w/ with staples - no cellulitis, some blood-tinged JP drainage, 285cc/24hrs, dry dressing replaced.   ?NG tube minimal bilious output ? ? ?Lab Results:  ?Recent Labs  ?  08/31/21 ?0246 09/01/21 ?0117  ?WBC 12.9* 12.9*  ?HGB 8.7* 9.8*  ?HCT 27.6* 30.2*  ?PLT 664* 556*  ? ?BMET ?Recent Labs  ?  08/31/21 ?0246 09/01/21 ?0117  ?NA 136 136  ?K 3.9 3.8  ?CL 104 105  ?CO2 25 24  ?GLUCOSE 92 84  ?BUN 11 6  ?CREATININE 0.39* 0.32*  ?CALCIUM 8.1* 8.3*  ? ?PT/INR ?No results for input(s): LABPROT, INR in the last 72 hours. ?CMP  ?   ?Component Value Date/Time  ? NA 136 09/01/2021 0117  ? K 3.8 09/01/2021 0117  ? CL 105 09/01/2021 0117  ? CO2 24 09/01/2021 0117  ? GLUCOSE 84 09/01/2021 0117  ? BUN 6 09/01/2021 0117  ? CREATININE 0.32 (L) 09/01/2021 0117  ? CALCIUM 8.3 (L) 09/01/2021 0117  ? PROT 6.5 08/26/2021 1714  ? ALBUMIN 1.9 (L) 08/26/2021 1714  ? AST 76 (H) 08/26/2021 1714  ? ALT 107 (H) 08/26/2021 1714  ? ALKPHOS 104 08/26/2021 1714  ? BILITOT 0.6 08/26/2021 1714  ? GFRNONAA >60 09/01/2021 0117  ? ?Lipase  ?No results found for: LIPASE ? ? ? ? ?Studies/Results: ?CT ABDOMEN PELVIS W CONTRAST ? ?Result Date: 09/01/2021 ?CLINICAL DATA:  History of gastric perforation with  psoas abscesses. EXAM: CT ABDOMEN AND PELVIS WITH CONTRAST TECHNIQUE: Multidetector CT imaging of the abdomen and pelvis was performed using the standard protocol following bolus administration of intravenous contrast. RADIATION DOSE REDUCTION: This exam was performed according to the departmental dose-optimization program which includes automated exposure control, adjustment of the mA and/or kV according to patient size and/or use of iterative reconstruction technique. CONTRAST:  100mL OMNIPAQUE IOHEXOL 300 MG/ML  SOLN COMPARISON:  Comparison is made with August 29, 2021. FINDINGS: Lower chest: Basilar airspace disease and small effusions with RIGHT-sided thoracostomy tube in place, thoracostomy tube incompletely imaged. No visible pneumothorax. Airspace disease slightly improved in the RIGHT lung base but with increased LEFT-sided pleural fluid since the prior study and increasing LEFT lower lobe airspace disease. Hepatobiliary: Smooth hepatic contours. Liver contours do show mild lobulation however and mild fissural widening. Portal vein is patent. No pericholecystic stranding. No visible hepatic lesion. Pancreas: Normal, without mass, inflammation or ductal dilatation. Spleen: Mild splenomegaly. Adrenals/Urinary Tract: Normal adrenal glands. No hydronephrosis, perinephric stranding or suspicious renal lesion. Small amount of gas in the urinary bladder. Stomach/Bowel: Gastric tube in place. Stranding about the stomach is mild. Post Graham patch for gastric perforation. No small bowel dilation. Appendix is normal. No colonic distension. No pericolonic stranding. Vascular/Lymphatic: Aorta with smooth contours. IVC with smooth contours. No aneurysmal  dilation of the abdominal aorta. There is no gastrohepatic or hepatoduodenal ligament lymphadenopathy. No retroperitoneal or mesenteric lymphadenopathy. No pelvic sidewall lymphadenopathy. Scattered small lymph nodes throughout the abdomen and pelvis likely reactive.  Reproductive: Post hysterectomy. Other: Multiloculated iliopsoas abscess is on the RIGHT and LEFT (image 65/3) 3.6 x 3.7 cm similar size compared to most recent imaging. On the LEFT 2.7 x 1.7 cm also largely similar to previous imaging. Diffuse body wall edema which is similar to prior studies. Small amount of rim enhancing pelvic fluid (image 75/3) 2.2 x 2.1 cm just to the RIGHT of the rectum diminished volume of fluid in the pelvis since the preoperative evaluation of March 26th. RIGHT abductor magnus with subtle low attenuation not substantially changed since March 25th measuring 2.3 x 1.2 cm. Small intramuscular abscess in the adductor musculature on the RIGHT (image 108/3) 12 mm. Not imaged on previous exams Trace pneumoperitoneum resolution of nearly all pneumoperitoneum seen on the prior study. Post interval laparotomy with midline postoperative changes. Musculoskeletal: Signs of L5-S1 disc irregularity in this patient with history of osteo myelitis IMPRESSION: 1. Post Graham patch for gastric perforation. Near complete resolution of pneumoperitoneum with only tiny locules of gas following laparotomy in the peritoneal cavity. 2. Small amount of loculated pelvic fluid. Fluid markedly reduced since prior imaging. 3. Multiloculated iliopsoas abscess is on the RIGHT and LEFT similar size compared to most recent imaging. 4. Small RIGHT abductor intramuscular abscesses and or myositis. The smaller area not imaged on the prior study. 5. Diminished RIGHT and increased LEFT basilar airspace disease with similar volume of RIGHT-sided pleural fluid and slightly increased LEFT-sided pleural fluid since the prior study. RIGHT thoracostomy tube remains in place and is incompletely imaged. 6. Small amount of gas in the urinary bladder. Correlate with any recent instrumentation. 7. Diffuse body wall edema which is similar to prior studies. 8. Mild splenic enlargement and lobular contours of the liver with fissural widening,  correlate with any clinical or laboratory evidence of liver disease. Electronically Signed   By: Donzetta Kohut M.D.   On: 09/01/2021 08:17  ? ?DG CHEST PORT 1 VIEW ? ?Result Date: 09/01/2021 ?CLINICAL DATA:  Right chest tube, pleural effusion EXAM: PORTABLE CHEST 1 VIEW COMPARISON:  08/30/2021 FINDINGS: Stable right pigtail chest position. No significant enlarging effusion. Negative for pneumothorax. Stable basilar atelectasis/airspace process worse in the left lower lobe obscuring the left hemidiaphragm. Small left effusion not excluded. Trachea midline. NG tube within the stomach. Chronic changes of the right clavicle with residual fracture and deformity. IMPRESSION: Stable support apparatus. No significant or enlarging right pleural effusion. Negative for pneumothorax. Residual left greater than right basilar atelectasis versus consolidation. Electronically Signed   By: Judie Petit.  Shick M.D.   On: 09/01/2021 09:06   ? ?Anti-infectives: ?Anti-infectives (From admission, onward)  ? ? Start     Dose/Rate Route Frequency Ordered Stop  ? 09/01/21 2200  vancomycin (VANCOREADY) IVPB 1750 mg/350 mL       ? 1,750 mg ?175 mL/hr over 120 Minutes Intravenous Every 12 hours 09/01/21 1058    ? 08/30/21 1000  metroNIDAZOLE (FLAGYL) IVPB 500 mg  Status:  Discontinued       ? 500 mg ?100 mL/hr over 60 Minutes Intravenous Every 12 hours 08/30/21 0036 08/30/21 1708  ? 08/30/21 0600  metroNIDAZOLE (FLAGYL) IVPB 500 mg  Status:  Discontinued       ? 500 mg ?100 mL/hr over 60 Minutes Intravenous On call to O.R. 08/30/21 0220 08/30/21 0229  ?  08/30/21 0245  ciprofloxacin (CIPRO) IVPB 400 mg  Status:  Discontinued       ? 400 mg ?200 mL/hr over 60 Minutes Intravenous Every 12 hours 08/30/21 0037 08/30/21 1708  ? 08/30/21 0045  cefTRIAXone (ROCEPHIN) 2 g in sodium chloride 0.9 % 100 mL IVPB  Status:  Discontinued       ? 2 g ?200 mL/hr over 30 Minutes Intravenous Every 24 hours 08/30/21 0036 08/30/21 0036  ? 08/29/21 2315  metroNIDAZOLE  (FLAGYL) IVPB 500 mg       ?Note to Pharmacy: TO MAIN OR STAT!! In surgery now  ? 500 mg ?100 mL/hr over 60 Minutes Intravenous To Surgery 08/29/21 2302 08/29/21 2319  ? 08/29/21 2200  vancomycin (VANCOREADY) I

## 2021-09-01 NOTE — Assessment & Plan Note (Addendum)
HCV antibody positive ?HCV quantitative was not detected. ? ?LFTs noted to be normal on 4/4. ? ? ?

## 2021-09-01 NOTE — Evaluation (Signed)
Occupational Therapy Evaluation ?Patient Details ?Name: Kimberly Booker ?MRN: [id number] ?DOB: 1985-08-11 ?Today's Date: 09/01/2021 ? ? ?History of Present Illness 36 year old with past medical history significant for anxiety, depression, suicidal attempt drug overdose, polysubstance abuse and right clavicle fracture 8 months ago.  She reported having occasional pain but a week ago and  was admitted at [facility] on 08/15/2021 due to right shoulder abscess and underwent I&D in the ED started IV antibiotics. Found to have MRSA bacteremia and TEE ruled out endocarditis.  Transferred to [facility] and found to have right clavicle osteomyelitis/phlegmon/abscess, widespread cervical,thoracic,lumbar discitis/osteomyelitis/epidural abscess/cord impingement and bilateral iliopsoas abscess, right complex pleural effusion.  Underwent lumbar laminectomy / debridement of abscess, L4-L5 on 3/25.  Developed right-sided empyema and gastric perforation.  Status post emergent laparotomy and perforation of repair by general surgery on 3/27.  PCCM also following for chest tube on the right side.  ? ?Clinical Impression ?  ?Pt reports independence at baseline with ADLs and functional mobility, lives with sister who can provide PRN assist at home. Pt currently min-max A for ADLs at bed level, mod A for supine <> sit transfer x2 at EOB, pt unable to sit EOB without mod support due to abdominal pain, props self up on elbows. Further mobility deferred. Pt presenting with increased pain, reporting 10/10, able to complete bed level LB exercise with supervision. Pt presenting with impairments listed below, will follow acutely. Recommend AIR at d/c.  ?   ? ?Recommendations for follow up therapy are one component of a multi-disciplinary discharge planning process, led by the attending physician.  Recommendations may be updated based on patient status, additional functional criteria and insurance authorization.  ? ?Follow Up Recommendations ?  Acute inpatient rehab (3hours/day)  ?  ?Assistance Recommended at Discharge Intermittent Supervision/Assistance  ?Patient can return home with the following Two people to help with walking and/or transfers;A lot of help with bathing/dressing/bathroom;Assistance with feeding;Assist for transportation;Help with stairs or ramp for entrance ? ?  ?Functional Status Assessment ? Patient has had a recent decline in their functional status and demonstrates the ability to make significant improvements in function in a reasonable and predictable amount of time.  ?Equipment Recommendations ? Tub/shower seat;BSC/3in1  ?  ?Recommendations for Other Services Rehab consult ? ? ?  ?Precautions / Restrictions Precautions ?Precautions: Fall;Back ?Precaution Comments: R chest tube to suction, L JP drain, NGT to suction ?Restrictions ?Weight Bearing Restrictions: No  ? ?  ? ?Mobility Bed Mobility ?Overal bed mobility: Needs Assistance ?Bed Mobility: Rolling, Sidelying to Sit, Sit to Sidelying ?Rolling: Supervision ?Sidelying to sit: Mod assist ?  ?  ?  ?General bed mobility comments: for trunk elevation, ?  ? ?Transfers ?Overall transfer level: Needs assistance ?  ?  ?  ?  ?  ?  ?  ?  ?General transfer comment: pt declining due to pain, deferred ?  ? ?  ?Balance Overall balance assessment: Needs assistance ?Sitting-balance support: Feet supported ?Sitting balance-Leahy Scale: Poor ?Sitting balance - Comments: unable to fully sit EOB without mod support ?  ?  ?  ?Standing balance comment: deferred attempt ?  ?  ?  ?  ?  ?  ?  ?  ?  ?  ?  ?   ? ?ADL either performed or assessed with clinical judgement  ? ?ADL Overall ADL's : Needs assistance/impaired ?Eating/Feeding: Bed level;NPO ?  ?Grooming: Min guard;Bed level ?  ?Upper Body Bathing: Moderate assistance;Bed level ?  ?Lower Body Bathing:  Moderate assistance;Bed level ?  ?Upper Body Dressing : Moderate assistance;Bed level ?  ?Lower Body Dressing: Moderate assistance;Bed level ?   ?Toilet Transfer: Maximal assistance;+2 for physical assistance;BSC/3in1;Stand-pivot ?  ?Toileting- Clothing Manipulation and Hygiene: Moderate assistance ?  ?  ?  ?Functional mobility during ADLs: Moderate assistance;+2 for physical assistance ?   ? ? ? ?Vision Baseline Vision/History: 0 No visual deficits ?Vision Assessment?: No apparent visual deficits  ?   ?Perception   ?  ?Praxis   ?  ? ?Pertinent Vitals/Pain Pain Assessment ?Pain Assessment: Faces ?Pain Score: 10-Worst pain ever ?Pain Location: back and abdomen, generalized ?Pain Descriptors / Indicators: Grimacing, Guarding, Discomfort, Moaning, Constant, Restless ?Pain Intervention(s): Limited activity within patient's tolerance, Monitored during session, Patient requesting pain meds-RN notified  ? ? ? ?Hand Dominance Right ?  ?Extremity/Trunk Assessment Upper Extremity Assessment ?Upper Extremity Assessment: Generalized weakness ?RUE Deficits / Details: shoulder with bandaging, not tested for ROM ?  ?Lower Extremity Assessment ?Lower Extremity Assessment: Defer to PT evaluation ?  ?Cervical / Trunk Assessment ?Cervical / Trunk Assessment: Back Surgery;Other exceptions ?Cervical / Trunk Exceptions: abdominal surgery ?  ?Communication Communication ?Communication: No difficulties ?  ?Cognition Arousal/Alertness: Awake/alert ?Behavior During Therapy: Anxious, Restless ?Overall Cognitive Status: Within Functional Limits for tasks assessed ?  ?  ?  ?  ?  ?  ?  ?  ?  ?  ?  ?  ?  ?  ?  ?  ?  ?  ?  ?General Comments  VSS on RA ? ?  ?Exercises Exercises: General Lower Extremity, General Upper Extremity ?General Exercises - Upper Extremity ?Shoulder Flexion: Left, 5 reps, Supine, AROM ?Shoulder Extension: Left, 5 reps, AROM, Supine ?General Exercises - Lower Extremity ?Long Arc Quad: AROM, Both, 10 reps, Supine ?Hip Flexion/Marching: AROM, Both, 10 reps, Supine ?  ?Shoulder Instructions    ? ? ?Home Living Family/patient expects to be discharged to:: Private  residence ?Living Arrangements: Other relatives (lives with sister) ?Available Help at Discharge: Family;Available PRN/intermittently ?Type of Home: House ?Home Access: Stairs to enter ?Entrance Stairs-Number of Steps: 8 ?Entrance Stairs-Rails: Left;Right;Can reach both ?Home Layout: Two level;Bed/bath upstairs;Able to live on main level with bedroom/bathroom ?Alternate Level Stairs-Number of Steps: flight ?Alternate Level Stairs-Rails: Right ?Bathroom Shower/Tub: Tub/shower unit;Curtain ?  ?  ?Bathroom Accessibility: Yes ?How Accessible: Accessible via walker ?Home Equipment: Agricultural consultantolling Walker (2 wheels);Cane - single point;BSC/3in1 ?  ?Additional Comments: just has DME, does not use ?  ? ?  ?Prior Functioning/Environment Prior Level of Function : Independent/Modified Independent ?  ?  ?  ?  ?  ?  ?Mobility Comments: no AD ?ADLs Comments: does IADLs ?  ? ?  ?  ?OT Problem List:   ?  ?   ?OT Treatment/Interventions: DME and/or AE instruction;Therapeutic exercise;Self-care/ADL training;Therapeutic activities;Patient/family education;Balance training  ?  ?OT Goals(Current goals can be found in the care plan section) Acute Rehab OT Goals ?Patient Stated Goal: decrease pain ?OT Goal Formulation: With patient ?Time For Goal Achievement: 09/15/21 ?Potential to Achieve Goals: Good ?ADL Goals ?Pt Will Perform Upper Body Dressing: with min guard assist;sitting;bed level ?Pt Will Perform Lower Body Dressing: with min guard assist;sitting/lateral leans;bed level ?Pt Will Transfer to Toilet: with mod assist;stand pivot transfer;bedside commode ?Pt Will Perform Tub/Shower Transfer: with min assist;ambulating;Shower transfer;Tub transfer;shower seat  ?OT Frequency: Min 3X/week ?  ? ?Co-evaluation   ?  ?  ?  ?  ? ?  ?AM-PAC OT "6 Clicks" Daily Activity     ?  Outcome Measure Help from another person eating meals?: None (has UE mobility/ROM, is NPO) ?Help from another person taking care of personal grooming?: A Little ?Help from another  person toileting, which includes using toliet, bedpan, or urinal?: A Lot ?Help from another person bathing (including washing, rinsing, drying)?: A Lot ?Help from another person to put on and taking off regular uppe

## 2021-09-01 NOTE — Progress Notes (Addendum)
[facility]  ? ?Case discussed with Dr. Lajoyce Corners and Dr. August Saucer  ?Agree drainage of B psoas abscesses.  Do think it is worthwhile to attempt percutaneous drainage with IR.  Pts body habitus does increase risk with formal open debridement.  Available to discuss need for formal open debridement if future needs arise  ? ?NPO after MN for IR procedure  ? ?Mearl Latin, PA-C ?[phone] (C) ?09/01/2021, 3:55 PM ? ?[facility] ?[address] ?[place] [address] ?[phone] [place]) ?[phone] (F) ? ? ? ?Patient ID: Kimberly Booker, female   DOB: Nov 20, 1985, 36 y.o.   MRN: [id number] ? ?

## 2021-09-02 ENCOUNTER — Encounter (HOSPITAL_COMMUNITY)
Admission: AD | Disposition: A | Discharge status: Home / Self Care | Payer: Self-pay | Source: Other Acute Inpatient Hospital | Attending: Internal Medicine

## 2021-09-02 ENCOUNTER — Inpatient Hospital Stay (HOSPITAL_COMMUNITY): Payer: Medicaid Other

## 2021-09-02 ENCOUNTER — Inpatient Hospital Stay (HOSPITAL_COMMUNITY): Payer: Medicaid Other | Admitting: Critical Care Medicine

## 2021-09-02 ENCOUNTER — Encounter (HOSPITAL_COMMUNITY): Payer: Self-pay | Admitting: Internal Medicine

## 2021-09-02 DIAGNOSIS — D649 Anemia, unspecified: Secondary | ICD-10-CM

## 2021-09-02 DIAGNOSIS — F418 Other specified anxiety disorders: Secondary | ICD-10-CM

## 2021-09-02 DIAGNOSIS — L02214 Cutaneous abscess of groin: Secondary | ICD-10-CM

## 2021-09-02 HISTORY — PX: RADIOLOGY WITH ANESTHESIA: SHX6223

## 2021-09-02 LAB — COMPREHENSIVE METABOLIC PANEL
ALT: 40 U/L (ref 0–44)
AST: 24 U/L (ref 15–41)
Albumin: 1.9 g/dL — ABNORMAL LOW (ref 3.5–5.0)
Alkaline Phosphatase: 76 U/L (ref 38–126)
Anion gap: 10 (ref 5–15)
BUN: 6 mg/dL (ref 6–20)
CO2: 23 mmol/L (ref 22–32)
Calcium: 8.2 mg/dL — ABNORMAL LOW (ref 8.9–10.3)
Chloride: 99 mmol/L (ref 98–111)
Creatinine, Ser: 0.31 mg/dL — ABNORMAL LOW (ref 0.44–1.00)
GFR, Estimated: >60 mL/min (ref >60)
Glucose, Bld: 82 mg/dL (ref 70–99)
Potassium: 3.7 mmol/L (ref 3.5–5.1)
Sodium: 132 mmol/L — ABNORMAL LOW (ref 135–145)
Total Bilirubin: 0.8 mg/dL (ref 0.3–1.2)
Total Protein: 6 g/dL — ABNORMAL LOW (ref 6.5–8.1)

## 2021-09-02 LAB — AEROBIC/ANAEROBIC CULTURE W GRAM STAIN (SURGICAL/DEEP WOUND): Gram Stain: NONE SEEN

## 2021-09-02 LAB — CBC
HCT: 27.5 % — ABNORMAL LOW (ref 36.0–46.0)
Hemoglobin: 9 g/dL — ABNORMAL LOW (ref 12.0–15.0)
MCH: 30.3 pg (ref 26.0–34.0)
MCHC: 32.7 g/dL (ref 30.0–36.0)
MCV: 92.6 fL (ref 80.0–100.0)
Platelets: 532 10*3/uL — ABNORMAL HIGH (ref 150–400)
RBC: 2.97 MIL/uL — ABNORMAL LOW (ref 3.87–5.11)
RDW: 14.1 % (ref 11.5–15.5)
WBC: 16.9 10*3/uL — ABNORMAL HIGH (ref 4.0–10.5)
nRBC: 0 % (ref 0.0–0.2)

## 2021-09-02 LAB — CHOLESTEROL, BODY FLUID: Cholesterol, Fluid: 52 mg/dL

## 2021-09-02 SURGERY — IR WITH ANESTHESIA
Anesthesia: General

## 2021-09-02 MED ORDER — PHENYLEPHRINE 40 MCG/ML (10ML) SYRINGE FOR IV PUSH (FOR BLOOD PRESSURE SUPPORT)
PREFILLED_SYRINGE | INTRAVENOUS | Status: AC
  Filled 2021-09-02: qty 10

## 2021-09-02 MED ORDER — MIDAZOLAM HCL 2 MG/2ML IJ SOLN
INTRAMUSCULAR | Status: AC
  Filled 2021-09-02: qty 2

## 2021-09-02 MED ORDER — DEXAMETHASONE SODIUM PHOSPHATE 10 MG/ML IJ SOLN
INTRAMUSCULAR | Status: DC | PRN
  Administered 2021-09-02: 10 mg via INTRAVENOUS

## 2021-09-02 MED ORDER — LACTATED RINGERS IV SOLN: INTRAVENOUS | Status: DC

## 2021-09-02 MED ORDER — DEXMEDETOMIDINE (PRECEDEX) IN NS 20 MCG/5ML (4 MCG/ML) IV SYRINGE
PREFILLED_SYRINGE | INTRAVENOUS | Status: AC
  Filled 2021-09-02: qty 10

## 2021-09-02 MED ORDER — ORAL CARE MOUTH RINSE: 15.0000 mL | Freq: Once | OROMUCOSAL | Status: AC

## 2021-09-02 MED ORDER — PHENYLEPHRINE 40 MCG/ML (10ML) SYRINGE FOR IV PUSH (FOR BLOOD PRESSURE SUPPORT)
PREFILLED_SYRINGE | INTRAVENOUS | Status: DC | PRN
  Administered 2021-09-02: 160 ug via INTRAVENOUS
  Administered 2021-09-02: 120 ug via INTRAVENOUS
  Administered 2021-09-02: 160 ug via INTRAVENOUS

## 2021-09-02 MED ORDER — CHLORHEXIDINE GLUCONATE 4 % EX LIQD: 60.0000 mL | Freq: Once | CUTANEOUS | Status: DC

## 2021-09-02 MED ORDER — LIDOCAINE HCL 1 % IJ SOLN
INTRAMUSCULAR | Status: AC
  Filled 2021-09-02: qty 10

## 2021-09-02 MED ORDER — CHLORHEXIDINE GLUCONATE 0.12 % MT SOLN
15.0000 mL | Freq: Once | OROMUCOSAL | Status: AC
  Administered 2021-09-02: 15 mL via OROMUCOSAL

## 2021-09-02 MED ORDER — LIDOCAINE 2% (20 MG/ML) 5 ML SYRINGE
INTRAMUSCULAR | Status: AC
  Filled 2021-09-02: qty 5

## 2021-09-02 MED ORDER — POVIDONE-IODINE 10 % EX SWAB: 2.0000 "application " | Freq: Once | CUTANEOUS | Status: DC

## 2021-09-02 MED ORDER — FENTANYL CITRATE (PF) 250 MCG/5ML IJ SOLN
INTRAMUSCULAR | Status: AC
  Filled 2021-09-02: qty 5

## 2021-09-02 MED ORDER — PROPOFOL 10 MG/ML IV BOLUS
INTRAVENOUS | Status: AC
  Filled 2021-09-02: qty 20

## 2021-09-02 MED ORDER — ROCURONIUM BROMIDE 10 MG/ML (PF) SYRINGE
PREFILLED_SYRINGE | INTRAVENOUS | Status: AC
  Filled 2021-09-02: qty 10

## 2021-09-02 MED ORDER — CHLORHEXIDINE GLUCONATE 0.12 % MT SOLN
OROMUCOSAL | Status: AC
  Administered 2021-09-03: 15 mL via OROMUCOSAL
  Filled 2021-09-02: qty 15

## 2021-09-02 MED ORDER — MIDAZOLAM HCL 2 MG/2ML IJ SOLN
INTRAMUSCULAR | Status: DC | PRN
  Administered 2021-09-02: 2 mg via INTRAVENOUS

## 2021-09-02 MED ORDER — FENTANYL CITRATE (PF) 250 MCG/5ML IJ SOLN
INTRAMUSCULAR | Status: DC | PRN
  Administered 2021-09-02 (×2): 50 ug via INTRAVENOUS
  Administered 2021-09-02: 100 ug via INTRAVENOUS
  Administered 2021-09-02: 50 ug via INTRAVENOUS

## 2021-09-02 MED ORDER — ONDANSETRON HCL 4 MG/2ML IJ SOLN
INTRAMUSCULAR | Status: AC
  Filled 2021-09-02: qty 2

## 2021-09-02 MED ORDER — DEXMEDETOMIDINE (PRECEDEX) IN NS 20 MCG/5ML (4 MCG/ML) IV SYRINGE
PREFILLED_SYRINGE | INTRAVENOUS | Status: DC | PRN
  Administered 2021-09-02: 8 ug via INTRAVENOUS
  Administered 2021-09-02: 12 ug via INTRAVENOUS

## 2021-09-02 MED ORDER — SUCCINYLCHOLINE CHLORIDE 200 MG/10ML IV SOSY
PREFILLED_SYRINGE | INTRAVENOUS | Status: AC
  Filled 2021-09-02: qty 10

## 2021-09-02 MED ORDER — SUGAMMADEX SODIUM 200 MG/2ML IV SOLN
INTRAVENOUS | Status: DC | PRN
  Administered 2021-09-02: 200 mg via INTRAVENOUS

## 2021-09-02 MED ORDER — SUCCINYLCHOLINE CHLORIDE 200 MG/10ML IV SOSY
PREFILLED_SYRINGE | INTRAVENOUS | Status: DC | PRN
  Administered 2021-09-02: 140 mg via INTRAVENOUS

## 2021-09-02 MED ORDER — ONDANSETRON HCL 4 MG/2ML IJ SOLN
INTRAMUSCULAR | Status: DC | PRN
  Administered 2021-09-02: 4 mg via INTRAVENOUS

## 2021-09-02 MED ORDER — ROCURONIUM BROMIDE 10 MG/ML (PF) SYRINGE
PREFILLED_SYRINGE | INTRAVENOUS | Status: DC | PRN
  Administered 2021-09-02: 35 mg via INTRAVENOUS

## 2021-09-02 MED ORDER — IOHEXOL 300 MG/ML  SOLN
100.0000 mL | Freq: Once | INTRAMUSCULAR | Status: AC | PRN
  Administered 2021-09-02: 100 mL via INTRAVENOUS

## 2021-09-02 MED ORDER — DEXAMETHASONE SODIUM PHOSPHATE 10 MG/ML IJ SOLN
INTRAMUSCULAR | Status: AC
  Filled 2021-09-02: qty 1

## 2021-09-02 MED ORDER — PROPOFOL 10 MG/ML IV BOLUS
INTRAVENOUS | Status: DC | PRN
  Administered 2021-09-02: 200 mg via INTRAVENOUS

## 2021-09-02 NOTE — Progress Notes (Signed)
Inpatient Rehab Admissions Coordinator:  ? ?Stopped by patients room to discuss CIR.  Off floor for procedure.  Note plans for OR tomorrow for I&D with ortho.  Will f/u either tomorrow or Monday.  ? ?Shann Medal, PT, DPT ?Admissions Coordinator ?[phone] ?09/02/21  ?3:51 PM ? ?

## 2021-09-02 NOTE — H&P (View-Only) (Signed)
Patient ID: Kimberly Booker, female   DOB: 1985-06-22, 36 y.o.   MRN: [id number] ?Patient is seen in follow-up for osteomyelitis abscess drainage right midshaft clavicle.  Examination patient has 2 open wounds with purulent drainage.  CT scan shows a large destructive area of clavicle with associated abscess.  We will plan for proceeding with surgery tomorrow for excisional debridement of the right clavicle and placement of an installation wound VAC.  Risks and benefits were discussed including need for additional surgery.  Patient states she understands wishes to proceed. ?

## 2021-09-02 NOTE — Progress Notes (Signed)
Patient ID: Kimberly Booker, female   DOB: 05/13/1986, 36 y.o.   MRN: [id number] ?Patient is seen in follow-up for osteomyelitis abscess drainage right midshaft clavicle.  Examination patient has 2 open wounds with purulent drainage.  CT scan shows a large destructive area of clavicle with associated abscess.  We will plan for proceeding with surgery tomorrow for excisional debridement of the right clavicle and placement of an installation wound VAC.  Risks and benefits were discussed including need for additional surgery.  Patient states she understands wishes to proceed. ?

## 2021-09-02 NOTE — Progress Notes (Signed)
?   ? ? ? ? ?[facility] for Infectious Disease   ? ?Date of Admission:  08/26/2021    ? ?Total days of antibiotics: 10 ?Vanco 03/26-continued ?Dapto 03/23-03/25 ?Cipro 03/26-03/27 ?[place]ro 03/36-03/27 ?Ceftriaxone 03/22-03/23 ?    ?Reason for Consult: MRSA    ?Referring Provider: EDP ?Primary Care Provider: unknown ? ?Assessment: ?Principal Problem: ?  MRSA bacteremia ?Active Problems: ?  Pathologic fracture of right clavicle ?  Anxiety with depression ?  Polysubstance abuse ([place]) ?  Protein-calorie malnutrition, severe ([place]) ?  Transaminitis ?  Normocytic anemia ?  Abscess in epidural space of lumbar spine ?  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  Pleural effusion ?  Iliopsoas abscess ([place]) ?  Gastric perforation ([place]) ? ?Plan: ?MRSA bacteremia with discitis, osteomyelitis/epidural abscess, osteomyelitis of Rt clavicle, bilateral iliopsoas abscess: ? 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 BC repeated at [facility] are negative. She is s/p laminectomy and debridement of L4/L5.  Patient is s/p exlap for perforated gastric ulcer. She had IR aspiration of bilateral iliopsoas abscesses yesterday and tolerated it well. Samples sent for Gram stain and culture. She also has surgical debridement for osteomyelitis of her right shoulder today. TEE was performed on 03/16 without evidence of endocarditis. She will need prolonged AB. Overwall, she appear to be clinically improved and endorses improved abdominal pain, passing flatus, and good appetite.  ?- Cont Vanco for at least 8 weeks ?- IR aspiration of iliopsoas abscesses yesterday, gram stain and culture pending ?- Rt shoulder debridement planned for today.  ?- Not a candidate for home antibiotics.  ? ?2. Gastric Ulcer complicated by pneumoperitoneum s/p ex lap. ?- Management per surgery  ? ?3. HCV AB positive  ?- HCV RNA negative ? ? Chlorhexidine Gluconate Cloth  6 each Topical Q0600  ? enoxaparin  (LOVENOX) injection  40 mg Subcutaneous Q24H  ? fentaNYL  1 patch Transdermal Q72H  ? FLUoxetine  40 mg Oral Daily  ? lidocaine  1 patch Transdermal Q24H  ? mouth rinse  15 mL Mouth Rinse BID  ? mupirocin ointment  1 application. Nasal BID  ? nicotine  21 mg Transdermal Daily  ? pantoprazole  40 mg Intravenous Q12H  ? sodium chloride flush  10 mL Other Q8H  ? ? ?HPI: Kimberly Booker is a 36 y.o. female anxiety, depression, polysubstance use disorder, chronic right clavicular fracture, hx of right MRSA shoulder abscess s/p I&D at [facility] on 08/15/21. Patient had positive BC for MRSA  and was treated with IV antibiotics. She had an echocardiogram (TEE?) on 03/16 without evidence of endocarditis. Patient was on ceftriaxone and vancomycin but switched to daptomycin at some point. She was ultimately transferred to [facility] for further evaluation and management. On arrivle, patient had extensive imaging showing evidence of wide-spread MRSA infection including multilevel discitis, osteomyelitis, and epidural abscess with the lumbar abscess s/p laminectomy by NS. She also has bilateral iliopsoas abscess and a right pleural effusion s/p pigtail cath placement. Her hospital course was complicated by exploratory laparotomy due to gastric ulcer with bowel perforation. She is currently on broad vancomycin.  ? ? ?Review of Systems: ?ROS - negative except what is noted in assessment and plan. ? ?Past Medical History:  ?Diagnosis Date  ? Anxiety with depression 08/26/2021  ? Polysubstance abuse ([place]) 08/26/2021  ? Suicide attempt by drug overdose ([place]) 08/26/2021  ? ? ?Social History  ? ?Tobacco Use  ?  Smoking status: Every Day  ?  Types: Cigarettes  ? Smokeless tobacco: Never  ?Substance Use Topics  ? Alcohol use: Not Currently  ? Drug use: Yes  ?  Types: Methamphetamines, Heroin  ? ? ?Family History  ?Problem Relation Age of Onset  ? Hypertension Other   ? ?Allergies  ?Allergen Reactions  ? Amoxicillin Swelling  ?  Throat  closes  ? ? ?OBJECTIVE: ?Blood pressure 122/81, pulse 100, temperature 98.4 ?F (36.9 ?C), temperature source Oral, resp. rate 19, height 5\' 7"  (1.702 m), weight 86.4 kg, SpO2 100 %. ? ?Physical Exam ?Constitutional:   ?   General: She is not in acute distress. ?   Appearance: She is ill-appearing. She is not diaphoretic.  ?HENT:  ?   Head: Normocephalic and atraumatic.  ?Eyes:  ?   Extraocular Movements: Extraocular movements intact.  ?Cardiovascular:  ?   Rate and Rhythm: Normal rate.  ?   Pulses: Normal pulses.  ?   Heart sounds: Normal heart sounds.  ?Pulmonary:  ?   Effort: Pulmonary effort is normal.  ?   Breath sounds: Normal breath sounds.  ?Abdominal:  ?   Palpations: Abdomen is soft.  ?   Tenderness: There is abdominal tenderness (minimal).  ?   Comments: Drain present - with serosanguinous fluid in drain   ?Musculoskeletal:     ?   General: Normal range of motion.  ?   Cervical back: Normal range of motion.  ?Skin: ?   General: Skin is warm and dry.  ?Neurological:  ?   General: No focal deficit present.  ?   Mental Status: She is alert.  ? ? ?Lab Results ?Lab Results  ?Component Value Date  ? WBC 16.9 (H) 09/02/2021  ? HGB 9.0 (L) 09/02/2021  ? HCT 27.5 (L) 09/02/2021  ? MCV 92.6 09/02/2021  ? PLT 532 (H) 09/02/2021  ?  ?Lab Results  ?Component Value Date  ? CREATININE 0.31 (L) 09/02/2021  ? BUN 6 09/02/2021  ? NA 132 (L) 09/02/2021  ? K 3.7 09/02/2021  ? CL 99 09/02/2021  ? CO2 23 09/02/2021  ?  ?Lab Results  ?Component Value Date  ? ALT 40 09/02/2021  ? AST 24 09/02/2021  ? ALKPHOS 76 09/02/2021  ? BILITOT 0.8 09/02/2021  ?  ? ?Microbiology: ?Recent Results (from the past 240 hour(s))  ?Culture, blood (routine x 2)     Status: None  ? Collection Time: 08/27/21 12:39 PM  ? Specimen: BLOOD  ?Result Value Ref Range Status  ? Specimen Description   Final  ?  BLOOD BLOOD RIGHT FOREARM ?Performed at [facility], [place] [address], [place], [place] [address] ?  ? Special Requests   Final  ?   BOTTLES DRAWN AEROBIC ONLY Blood Culture results may not be optimal due to an inadequate volume of blood received in culture bottles ?Performed at [facility], [place] [address] [place], [place] [address] ?  ? Culture   Final  ?  NO GROWTH 5 DAYS ?Performed at [facility], [place] [address] [place], [place] [address] ?  ? Report Status 09/01/2021 FINAL  Final  ?Culture, blood (routine x 2)     Status: None  ? Collection Time: 08/27/21 12:39 PM  ? Specimen: BLOOD  ?Result Value Ref Range Status  ? Specimen Description   Final  ?  BLOOD BLOOD RIGHT HAND ?Performed at [facility], [place] [address] [place], [place] [address] ?  ?  Special Requests   Final  ?  BOTTLES DRAWN AEROBIC ONLY Blood Culture adequate volume ?Performed at [facility], [place] [address], [place], [place] [address] ?  ? Culture   Final  ?  NO GROWTH 5 DAYS ?Performed at [facility], [place] [address] [place], [place] [address] ?  ? Report Status 09/01/2021 FINAL  Final  ?Aerobic/Anaerobic Culture w Gram Stain (surgical/deep wound)     Status: None (Preliminary result)  ? Collection Time: 08/28/21  1:42 PM  ? Specimen: Wound; Abscess  ?Result Value Ref Range Status  ? Specimen Description WOUND  Final  ? Special Requests LUMBAR EPIDURAL ABSCESS SPEC A  Final  ? Gram Stain   Final  ?  NO WBC SEEN ?RARE GRAM POSITIVE COCCI ?Performed at [facility], [place] [address], [place], [place] [address] ?  ? Culture   Final  ?  FEW METHICILLIN RESISTANT STAPHYLOCOCCUS AUREUS ?NO ANAEROBES ISOLATED; CULTURE IN PROGRESS FOR 5 DAYS ?  ? Report Status PENDING  Incomplete  ? Organism ID, Bacteria METHICILLIN RESISTANT STAPHYLOCOCCUS AUREUS  Final  ?    Susceptibility  ? Methicillin resistant staphylococcus aureus - MIC*  ?  CIPROFLOXACIN >=8 RESISTANT Resistant   ?  ERYTHROMYCIN >=8 RESISTANT Resistant   ?  GENTAMICIN <=0.5 SENSITIVE Sensitive   ?  OXACILLIN >=4 RESISTANT Resistant   ?  TETRACYCLINE  <=1 SENSITIVE Sensitive   ?  VANCOMYCIN 1 SENSITIVE Sensitive   ?  TRIMETH/SULFA >=320 RESISTANT Resistant   ?  CLINDAMYCIN >=8 RESISTANT Resistant   ?  RIFAMPIN <=0.5 SENSITIVE Sensitive   ?  Inducible Clin

## 2021-09-02 NOTE — Transfer of Care (Signed)
Immediate Anesthesia Transfer of Care Note ? ?Patient: Kimberly Booker ? ?Procedure(s) Performed: IR WITH ANESTHESIA ? ?Patient Location: PACU ? ?Anesthesia Type:General ? ?Level of Consciousness: awake and alert  ? ?Airway & Oxygen Therapy: Patient Spontanous Breathing and Patient connected to face mask oxygen ? ?Post-op Assessment: Report given to RN and Post -op Vital signs reviewed and stable ? ?Post vital signs: Reviewed and stable ? ?Last Vitals:  ?Vitals Value Taken Time  ?BP 117/66 09/02/21 1331  ?Temp    ?Pulse 96 09/02/21 1331  ?Resp 18 09/02/21 1331  ?SpO2 95 % 09/02/21 1331  ?Vitals shown include unvalidated device data. ? ?Last Pain:  ?Vitals:  ? 09/02/21 1010  ?TempSrc: Oral  ?PainSc:   ?   ? ?Patients Stated Pain Goal: 3 (09/02/21 0539) ? ?Complications: No notable events documented. ?

## 2021-09-02 NOTE — Anesthesia Procedure Notes (Signed)
Procedure Name: Intubation ?Date/Time: 09/02/2021 12:18 PM ?Performed by: Reece Agar, CRNA ?Pre-anesthesia Checklist: Patient identified, Emergency Drugs available, Suction available and Patient being monitored ?Patient Re-evaluated:Patient Re-evaluated prior to induction ?Oxygen Delivery Method: Circle System Utilized ?Preoxygenation: Pre-oxygenation with 100% oxygen ?Induction Type: IV induction ?Ventilation: Mask ventilation without difficulty ?Laryngoscope Size: Mac and 3 ?Grade View: Grade I ?Tube type: Oral ?Tube size: 7.0 mm ?Number of attempts: 1 ?Airway Equipment and Method: Stylet ?Placement Confirmation: ETT inserted through vocal cords under direct vision, positive ETCO2 and breath sounds checked- equal and bilateral ?Secured at: 22 cm ?Tube secured with: Tape ?Dental Injury: Teeth and Oropharynx as per pre-operative assessment  ? ? ? ? ?

## 2021-09-02 NOTE — Progress Notes (Signed)
NG tube removed around 1000 before patient went down for IR procedure.  ?

## 2021-09-02 NOTE — Progress Notes (Signed)
Report given to short stay RN  

## 2021-09-02 NOTE — H&P (Signed)
? ?Patient Status: [facility] - In-pt ? ?Assessment and Plan: ?Iliopsoas abscesses  ?Patient with bilateral fluid collections.  Consult requested for aspiration and drainage.  Case reviewed by Dr. Annamaria Boots who approves patient for attempt at drainage although notes location challenging due to position in the pelvis.  ?Discussed with patient, consult completed 3/29.  ? ?Patient planning to proceed today with anesthesia.  ?Labs and medications reviewed.  ?She has been NPO.  ? ?Risks and benefits discussed with the patient including bleeding, infection, damage to adjacent structures, bowel perforation/fistula connection, and sepsis. ? ?All of the patient's questions were answered, patient is agreeable to proceed. ?Consent signed and in chart. ? ?______________________________________________________________________ ? ? ?History of Present Illness: ?Kimberly Booker is a 36 y.o. female with past medical history of anxiety, depression, polysubstance abuse, s/p fall with clavical fracture 8 months ago.  She was lost to follow-up until 1 month ago when she developed acute pain and swelling along the right clavicle.  She attempted to decompress this herself, however ultimately required admission to [facility] for MRSA sepsis with acute kidney injury. She was transferred to [facility] for ongoing management of her osteomyelitis.Repeat CT Abdomen Pelvis obtained overnight and shows essentially unchanged, multilocuated collections.  Dr. Annamaria Boots has approved for aspiration and drainage of bilateral iliopsoas fluid collections ? ?Allergies and medications reviewed.  ? ?Review of Systems: A 12 point ROS discussed and pertinent positives are indicated in the HPI above.  All other systems are negative. ? ?Review of Systems  ?Constitutional:  Negative for fatigue and fever.  ?Respiratory:  Negative for cough and shortness of breath.   ?Cardiovascular:  Negative for chest pain.  ?Gastrointestinal:  Positive for abdominal pain. Negative for  nausea and vomiting.  ?Musculoskeletal:  Negative for back pain.  ?Psychiatric/Behavioral:  Negative for behavioral problems and confusion.   ? ?Vital Signs: ?BP 125/83   Pulse (!) 105   Temp 98.6 ?F (37 ?C) (Oral)   Resp 18   Ht 5\' 7"  (1.702 m)   Wt 170 lb (77.1 kg)   LMP  (LMP Unknown) Comment: pregnancy test neg.  SpO2 97%   BMI 26.63 kg/m?  ? ?Physical Exam ?Vitals and nursing note reviewed.  ?Constitutional:   ?   General: She is not in acute distress. ?   Appearance: Normal appearance. She is ill-appearing.  ?HENT:  ?   Mouth/Throat:  ?   Mouth: Mucous membranes are moist.  ?   Pharynx: Oropharynx is clear.  ?Cardiovascular:  ?   Rate and Rhythm: Normal rate and regular rhythm.  ?Pulmonary:  ?   Effort: Pulmonary effort is normal.  ?Skin: ?   General: Skin is warm and dry.  ?Neurological:  ?   General: No focal deficit present.  ?   Mental Status: She is alert and oriented to person, place, and time.  ?Psychiatric:     ?   Mood and Affect: Mood normal.     ?   Behavior: Behavior normal.  ? ? ? ?Imaging reviewed.  ? ?Labs: ? ?COAGS: ?No results for input(s): INR, APTT in the last 8760 hours. ? ?BMP: ?Recent Labs  ?  08/29/21 ?2153 08/31/21 ?0246 09/01/21 ?0117 09/02/21 ?0150  ?NA 136 136 136 132*  ?K 4.1 3.9 3.8 3.7  ?CL 104 104 105 99  ?CO2 26 25 24 23   ?GLUCOSE 105* 92 84 82  ?BUN 17 11 6 6   ?CALCIUM 7.9* 8.1* 8.3* 8.2*  ?CREATININE 0.34* 0.39* 0.32* 0.31*  ?GFRNONAA >60 >  60 >60 >60  ? ? ? ? ? ?Electronically Signed: ?Docia Barrier, PA ?09/02/2021, 11:40 AM ? ? ?I spent a total of 15 minutes in face to face in clinical consultation, greater than 50% of which was counseling/coordinating care for iliopsoas abscess. ? ? ?

## 2021-09-02 NOTE — Progress Notes (Signed)
Patient ID: Kimberly Booker, female   DOB: Oct 13, 1985, 36 y.o.   MRN: [id number] ?4 Days Post-Op  ?  ?Subjective: ?Some flatus, tolerating some CL ?ROS negative except as listed above. ?Objective: ?Vital signs in last 24 hours: ?Temp:  [98 ?F (36.7 ?C)-98.5 ?F (36.9 ?C)] 98 ?F (36.7 ?C) (03/30 8416) ?Pulse Rate:  [85-105] 105 (03/30 0824) ?Resp:  [18-20] 18 (03/30 0824) ?BP: (119-133)/(82-93) 119/83 (03/30 6063) ?SpO2:  [97 %-100 %] 99 % (03/30 0824) ?Last BM Date : 08/25/21 ? ?Intake/Output from previous day: ?03/29 0701 - 03/30 0700 ?In: 270 [P.O.:240; I.V.:30] ?Out: 910 [Urine:600; Drains:310] ?Intake/Output this shift: ?No intake/output data recorded. ? ?General appearance: alert and cooperative ?GI: soft, wound min drainage between loose staples, JP sang ? ?Lab Results: ?CBC  ?Recent Labs  ?  09/01/21 ?0117 09/02/21 ?0150  ?WBC 12.9* 16.9*  ?HGB 9.8* 9.0*  ?HCT 30.2* 27.5*  ?PLT 556* 532*  ? ?BMET ?Recent Labs  ?  09/01/21 ?0117 09/02/21 ?0150  ?NA 136 132*  ?K 3.8 3.7  ?CL 105 99  ?CO2 24 23  ?GLUCOSE 84 82  ?BUN 6 6  ?CREATININE 0.32* 0.31*  ?CALCIUM 8.3* 8.2*  ? ?PT/INR ?No results for input(s): LABPROT, INR in the last 72 hours. ?ABG ?No results for input(s): PHART, HCO3 in the last 72 hours. ? ?Invalid input(s): PCO2, PO2 ? ?Studies/Results: ?CT ABDOMEN PELVIS W CONTRAST ? ?Result Date: 09/01/2021 ?CLINICAL DATA:  History of gastric perforation with psoas abscesses. EXAM: CT ABDOMEN AND PELVIS WITH CONTRAST TECHNIQUE: Multidetector CT imaging of the abdomen and pelvis was performed using the standard protocol following bolus administration of intravenous contrast. RADIATION DOSE REDUCTION: This exam was performed according to the departmental dose-optimization program which includes automated exposure control, adjustment of the mA and/or kV according to patient size and/or use of iterative reconstruction technique. CONTRAST:  OMNIPAQUE IOHEXOL 300 MG/ML  SOLN COMPARISON:  Comparison is made with August 29, 2021. FINDINGS: Lower chest: Basilar airspace disease and small effusions with RIGHT-sided thoracostomy tube in place, thoracostomy tube incompletely imaged. No visible pneumothorax. Airspace disease slightly improved in the RIGHT lung base but with increased LEFT-sided pleural fluid since the prior study and increasing LEFT lower lobe airspace disease. Hepatobiliary: Smooth hepatic contours. Liver contours do show mild lobulation however and mild fissural widening. Portal vein is patent. No pericholecystic stranding. No visible hepatic lesion. Pancreas: Normal, without mass, inflammation or ductal dilatation. Spleen: Mild splenomegaly. Adrenals/Urinary Tract: Normal adrenal glands. No hydronephrosis, perinephric stranding or suspicious renal lesion. Small amount of gas in the urinary bladder. Stomach/Bowel: Gastric tube in place. Stranding about the stomach is mild. Post Graham patch for gastric perforation. No small bowel dilation. Appendix is normal. No colonic distension. No pericolonic stranding. Vascular/Lymphatic: Aorta with smooth contours. IVC with smooth contours. No aneurysmal dilation of the abdominal aorta. There is no gastrohepatic or hepatoduodenal ligament lymphadenopathy. No retroperitoneal or mesenteric lymphadenopathy. No pelvic sidewall lymphadenopathy. Scattered small lymph nodes throughout the abdomen and pelvis likely reactive. Reproductive: Post hysterectomy. Other: Multiloculated iliopsoas abscess is on the RIGHT and LEFT (image 65/3) 3.6 x 3.7 cm similar size compared to most recent imaging. On the LEFT 2.7 x 1.7 cm also largely similar to previous imaging. Diffuse body wall edema which is similar to prior studies. Small amount of rim enhancing pelvic fluid (image 75/3) 2.2 x 2.1 cm just to the RIGHT of the rectum diminished volume of fluid in the pelvis since the preoperative evaluation of March 26th. RIGHT  abductor magnus with subtle low attenuation not substantially changed since March  25th measuring 2.3 x 1.2 cm. Small intramuscular abscess in the adductor musculature on the RIGHT (image 108/3) 12 mm. Not imaged on previous exams Trace pneumoperitoneum resolution of nearly all pneumoperitoneum seen on the prior study. Post interval laparotomy with midline postoperative changes. Musculoskeletal: Signs of L5-S1 disc irregularity in this patient with history of osteo myelitis IMPRESSION: 1. Post Graham patch for gastric perforation. Near complete resolution of pneumoperitoneum with only tiny locules of gas following laparotomy in the peritoneal cavity. 2. Small amount of loculated pelvic fluid. Fluid markedly reduced since prior imaging. 3. Multiloculated iliopsoas abscess is on the RIGHT and LEFT similar size compared to most recent imaging. 4. Small RIGHT abductor intramuscular abscesses and or myositis. The smaller area not imaged on the prior study. 5. Diminished RIGHT and increased LEFT basilar airspace disease with similar volume of RIGHT-sided pleural fluid and slightly increased LEFT-sided pleural fluid since the prior study. RIGHT thoracostomy tube remains in place and is incompletely imaged. 6. Small amount of gas in the urinary bladder. Correlate with any recent instrumentation. 7. Diffuse body wall edema which is similar to prior studies. 8. Mild splenic enlargement and lobular contours of the liver with fissural widening, correlate with any clinical or laboratory evidence of liver disease. Electronically Signed   By: Zetta Bills M.D.   On: 09/01/2021 08:17  ? ?DG CHEST PORT 1 VIEW ? ?Result Date: 09/01/2021 ?CLINICAL DATA:  Right chest tube, pleural effusion EXAM: PORTABLE CHEST 1 VIEW COMPARISON:  08/30/2021 FINDINGS: Stable right pigtail chest position. No significant enlarging effusion. Negative for pneumothorax. Stable basilar atelectasis/airspace process worse in the left lower lobe obscuring the left hemidiaphragm. Small left effusion not excluded. Trachea midline. NG tube  within the stomach. Chronic changes of the right clavicle with residual fracture and deformity. IMPRESSION: Stable support apparatus. No significant or enlarging right pleural effusion. Negative for pneumothorax. Residual left greater than right basilar atelectasis versus consolidation. Electronically Signed   By: Jerilynn Mages.  Shick M.D.   On: 09/01/2021 09:06   ? ?Anti-infectives: ?Anti-infectives (From admission, onward)  ? ? Start     Dose/Rate Route Frequency Ordered Stop  ? 09/01/21 2200  vancomycin (VANCOREADY) IVPB 1750 mg/350 mL       ? 1,750 mg ?175 mL/hr over 120 Minutes Intravenous Every 12 hours 09/01/21 1058    ? 08/30/21 1000  metroNIDAZOLE (FLAGYL) IVPB 500 mg  Status:  Discontinued       ? 500 mg ?100 mL/hr over 60 Minutes Intravenous Every 12 hours 08/30/21 0036 08/30/21 1708  ? 08/30/21 0600  metroNIDAZOLE (FLAGYL) IVPB 500 mg  Status:  Discontinued       ? 500 mg ?100 mL/hr over 60 Minutes Intravenous On call to O.R. 08/30/21 0220 08/30/21 0229  ? 08/30/21 0245  ciprofloxacin (CIPRO) IVPB 400 mg  Status:  Discontinued       ? 400 mg ?200 mL/hr over 60 Minutes Intravenous Every 12 hours 08/30/21 0037 08/30/21 1708  ? 08/30/21 0045  cefTRIAXone (ROCEPHIN) 2 g in sodium chloride 0.9 % 100 mL IVPB  Status:  Discontinued       ? 2 g ?200 mL/hr over 30 Minutes Intravenous Every 24 hours 08/30/21 0036 08/30/21 0036  ? 08/29/21 2315  metroNIDAZOLE (FLAGYL) IVPB 500 mg       ?Note to Pharmacy: TO MAIN OR STAT!! In surgery now  ? 500 mg ?100 mL/hr over 60 Minutes Intravenous To Surgery  08/29/21 2302 08/29/21 2319  ? 08/29/21 2200  vancomycin (VANCOREADY) IVPB 1250 mg/250 mL  Status:  Discontinued       ? 1,250 mg ?166.7 mL/hr over 90 Minutes Intravenous Every 12 hours 08/29/21 1857 09/01/21 1058  ? 08/29/21 2100  Vancomycin (VANCOCIN) 1,250 mg in sodium chloride 0.9 % 250 mL IVPB  Status:  Discontinued       ? 1,250 mg ?166.7 mL/hr over 90 Minutes Intravenous Every 12 hours 08/29/21 0837 08/29/21 1858  ? 08/29/21  0930  vancomycin (VANCOCIN) 2,000 mg in sodium chloride 0.9 % 500 mL IVPB       ? 2,000 mg ?260 mL/hr over 120 Minutes Intravenous  Once 08/29/21 0837 08/29/21 1257  ? 08/26/21 2000  cefTRIAXone (ROCEPHIN) 2

## 2021-09-02 NOTE — Anesthesia Preprocedure Evaluation (Signed)
Anesthesia Evaluation  ?Patient identified by MRN, date of birth, ID band ?Patient awake ? ? ? ?Reviewed: ?Allergy & Precautions, NPO status , Patient's Chart, lab work & pertinent test results ? ?History of Anesthesia Complications ?Negative for: history of anesthetic complications ? ?Airway ?Mallampati: III ? ?TM Distance: >3 FB ?Neck ROM: Full ? ? ? Dental ? ?(+) Poor Dentition, Missing, Chipped,  ?  ?Pulmonary ?Current Smoker and Patient abstained from smoking.,  ?  ?breath sounds clear to auscultation ? ? ? ? ? ? Cardiovascular ?negative cardio ROS ? ? ?Rhythm:Regular Rate:Normal ? ?Echo: ?1. Left ventricular ejection fraction, by estimation, is 65 to 70%. The  ?left ventricle has normal function. The left ventricle has no regional  ?wall motion abnormalities. Left ventricular diastolic parameters were  ?normal.  ??2. Right ventricular systolic function is normal. The right ventricular  ?size is normal. There is normal pulmonary artery systolic pressure. The  ?estimated right ventricular systolic pressure is 30.7 mmHg.  ??3. The mitral valve is grossly normal. Trivial mitral valve  ?regurgitation.  ??4. The aortic valve is tricuspid. Aortic valve regurgitation is not  ?visualized.  ??5. The inferior vena cava is normal in size with greater than 50%  ?respiratory variability, suggesting right atrial pressure of 3 mmHg.  ?  ?Neuro/Psych ?PSYCHIATRIC DISORDERS Anxiety Depression  Neuromuscular disease   ? GI/Hepatic ?(+)  ?  ? substance abuse ? ,   ?Endo/Other  ?negative endocrine ROS ? Renal/GU ?negative Renal ROS  ? ?  ?Musculoskeletal ?negative musculoskeletal ROS ?(+)  ? Abdominal ?  ?Peds ? Hematology ? ?(+) Blood dyscrasia, anemia , Lab Results ?     Component                Value               Date                 ?     WBC                      16.9 (H)            09/02/2021           ?     HGB                      9.0 (L)             09/02/2021           ?     HCT                       27.5 (L)            09/02/2021           ?     MCV                      92.6                09/02/2021           ?     PLT                      532 (H)             09/02/2021           ?   ?Anesthesia Other Findings ? ?  Reproductive/Obstetrics ? ?  ? ? ? ? ? ? ? ? ? ? ? ? ? ?  ?  ? ? ? ? ? ? ? ? ?Anesthesia Physical ?Anesthesia Plan ? ?ASA: 3 ? ?Anesthesia Plan: General  ? ?Post-op Pain Management:   ? ?Induction: Intravenous ? ?PONV Risk Score and Plan: 2 and Ondansetron and Dexamethasone ? ?Airway Management Planned: Oral ETT ? ?Additional Equipment: None ? ?Intra-op Plan:  ? ?Post-operative Plan: Extubation in OR ? ?Informed Consent: I have reviewed the patients History and Physical, chart, labs and discussed the procedure including the risks, benefits and alternatives for the proposed anesthesia with the patient or authorized representative who has indicated his/her understanding and acceptance.  ? ? ? ?Dental advisory given ? ?Plan Discussed with: CRNA and Anesthesiologist ? ?Anesthesia Plan Comments:   ? ? ? ? ? ? ?Anesthesia Quick Evaluation ? ?

## 2021-09-02 NOTE — Procedures (Signed)
Interventional Radiology Procedure Note ? ?Procedure: CT guided bilateral Iliopsoas abscess aspiration ? ?Indication: Bilateral Iliopsoas abscess ? ?Findings: Please refer to procedural dictation for full description. ? ?Complications: None ? ?EBL: < 10 mL ? ?Miachel Roux, MD ?[phone] ? ? ?

## 2021-09-02 NOTE — Progress Notes (Signed)
? ?[facility] ?PROGRESS NOTE ? ? ?Ulla PotashDiana G Locicero ZOX:[id number] DOB: Oct 31, 1985 DOA: 08/26/2021  7 ?DOS: the patient was seen and examined on 09/02/2021 ? ?PCP: Pcp, No ? ?Brief History and Hospital Course:  ?36 year old with past medical history significant for anxiety, depression, suicidal attempt drug overdose, polysubstance abuse, right clavicle fracture, [facility] due to right shoulder abscess.She was treated with I&D in the emergency department followed by IV antibiotics therapy as an inpatient.  She was diagnosed with MRSA bacteremia, endocarditis was ruled out with a TEE performed 3/16, but she continued to have positive blood cultures despite treatment with ceftriaxone and vancomycin.  Antibiotic therapy was changed to daptomycin.  She was then transferred to [facility]. ?Extensive imagings have been done here which showed right clavicle osteomyelitis/phlegmon/abscess, widespread cervical, thoracic, lumbar discitis/osteomyelitis/epidural abscess/cord impingement.  MRI also showed bilateral iliopsoas abscess, right complex pleural effusion.  ID, orthopedics, neurosurgery following. ?Underwent lumbar laminectomy/debridement of abscess, L4-L5 on 3/25. Hospital course also remarkable for finding of right-sided empyema, gastric perforation. Status post emergent laparotomy and perforation of repair by general surgery.  PCCM was also following for chest tube on the right side, which was removed on 3/29. ? ?Consultants: Infectious disease, neurosurgery, orthopedics, interventional radiology.  General surgery ? ?Procedures:   ? ?3/27: Diagnostic laparoscopy, exploratory laparotomy, Cheree DittoGraham patch repair of gastric perforation ? ?3/25: L4-L5 laminectomy, debridement of epidural abscess ? ? ?Subjective: ?Complains of abdominal pain and anxiety.  Mostly stable.  No shortness of breath or chest pain. ? ? ? ?Assessment/Plan: ? ? ?* MRSA bacteremia ?-Patient likely has  disseminated MRSA bacteremia.  ?-She had TEE outside facility on 3/16, negative for endocarditis as per the report  ?-TTE did not show any vegetation, might need TEE here but has been delayed due emergent surgery for gastric perforation. ?ID is following.  Patient noted to be on vancomycin currently. ?Noted to be afebrile.  WBC remains elevated.  Last blood cultures from 3/24 negative. ? ?Gastric perforation (HCC) ?Complained of abdominal pain on 3/26.  Chest x-ray done after chest tube placement showed incidental finding of pneumoperitoneum.  CT abdomen/pelvis confirmed pneumoperitoneum.  General surgery consulted and she underwent emergent exploratory laparatomy with finding of gastric perforation.  ?General surgery continues to follow. ? ?Abscess in epidural space of cervical spine ?Discitis/osteomyelitis at C2-3, C5-6, and C6-7. Ventral epidural abscess at C2-3, C5, and C6 with cord impingement especially at the lower 2 levels. Facet arthritis on the left at C7-T1 and right at C3-4. ?-Also showed discitis/osteomyelitis of T9- T10, T11-T12 ?-Neurosurgery following periodically. ? 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 Bilateral facet arthritis at L5-S1. Dorsal epidural abscess at L4 and L5,highly compressive on the thecal sac. ?S/P  lumbar laminectomy, debridement of abscess, L4-L5 on 3/25. ?No further interventions planned by neurosurgery.  PT and OT. ? ?Iliopsoas abscess (HCC) ?Pelvic MRI showed bilateral infectious sacroiliitis and osteomyelitis of the sacrum and periarticular right iliac bone. Bilateral psoas abscesses, Small bilateral paraspinal muscle abscesses, intramuscular abscess within the left piriformis muscle, small intramuscular abscess within the left gluteus medius muscle. ?Interventional radiology was consulted to assist with drainage.  It appears that tentative plan is for drainage today. ? ? ?Pleural  effusion ?Chest CT showed a large loculated right pleural effusion concerning for empyema, subcentimeter groundglass nodularity within the bilateral lungs concerning for septic emboli.  ?S/P chest tube placement.  Fluid noted to be exudative.  No  need for DNase/tPA.   ?Chest x-ray showed resolution of the effusion.  Chest tube was discontinued on 3/29.  Respiratory status is stable. ? ?Normocytic anemia ?Hemoglobin low but stable.   ?Received 2 units PRBC outside facility.  ? ?Anxiety with depression ?Noted to be on Prozac and Ativan as needed. ? ?Acute osteomyelitis of right clavicle (HCC) ?MRI of the shoulder showed osteomyelitis, phlegmon, possible abscess.  Orthopedics following and planning for shoulder washout on 3/31. ? ? ?Polysubstance abuse (HCC) ?She declined IV drug abuse recently, last drug use was 2 years ago as per patient. ? ?Transaminitis ?HCV antibody positive ?HCV quantitative was not detected. ? ?LFTs noted to be normal today. ? ? ? ? ? ? ?DVT Prophylaxis: Lovenox ?Code Status: Full code ?Family Communication: Discussed with patient ?Disposition Plan: To be determined ? ?Status is: Inpatient ?Remains inpatient appropriate because: IV antibiotics.  Need for surgical interventions ? ? ? ? ?Medications: Scheduled: ? [MAR Hold] Chlorhexidine Gluconate Cloth  6 each Topical Q0600  ? [MAR Hold] enoxaparin (LOVENOX) injection  40 mg Subcutaneous Q24H  ? [MAR Hold] fentaNYL  1 patch Transdermal Q72H  ? [MAR Hold] FLUoxetine  40 mg Oral Daily  ? [MAR Hold] lidocaine  1 patch Transdermal Q24H  ? [MAR Hold] mouth rinse  15 mL Mouth Rinse BID  ? [MAR Hold] mupirocin ointment  1 application. Nasal BID  ? [MAR Hold] nicotine  21 mg Transdermal Daily  ? [MAR Hold] pantoprazole  40 mg Intravenous Q12H  ? [MAR Hold] sodium chloride flush  10 mL Other Q8H  ? ?Continuous: ? lactated ringers 75 mL/hr at 08/31/21 0820  ? lactated ringers    ? [MAR Hold] methocarbamol (ROBAXIN) IV 1,000 mg (09/02/21 0867)  ? [MAR Hold]  vancomycin 1,750 mg (09/02/21 0820)  ? ?PRN:[MAR Hold]  HYDROmorphone (DILAUDID) injection, [MAR Hold] LORazepam, [DISCONTINUED] ondansetron **OR** [MAR Hold] ondansetron (ZOFRAN) IV, [MAR Hold] phenol ? ?Antibiotics: ?Anti-infectives (From admission, onward)  ? ? Start     Dose/Rate Route Frequency Ordered Stop  ? 09/01/21 2200  [MAR Hold]  vancomycin (VANCOREADY) IVPB 1750 mg/350 mL        (MAR Hold since Thu 09/02/2021 at 1001.Hold Reason: Transfer to a Procedural area)  ? 1,750 mg ?175 mL/hr over 120 Minutes Intravenous Every 12 hours 09/01/21 1058    ? 08/30/21 1000  metroNIDAZOLE (FLAGYL) IVPB 500 mg  Status:  Discontinued       ? 500 mg ?100 mL/hr over 60 Minutes Intravenous Every 12 hours 08/30/21 0036 08/30/21 1708  ? 08/30/21 0600  metroNIDAZOLE (FLAGYL) IVPB 500 mg  Status:  Discontinued       ? 500 mg ?100 mL/hr over 60 Minutes Intravenous On call to O.R. 08/30/21 0220 08/30/21 0229  ? 08/30/21 0245  ciprofloxacin (CIPRO) IVPB 400 mg  Status:  Discontinued       ? 400 mg ?200 mL/hr over 60 Minutes Intravenous Every 12 hours 08/30/21 0037 08/30/21 1708  ? 08/30/21 0045  cefTRIAXone (ROCEPHIN) 2 g in sodium chloride 0.9 % 100 mL IVPB  Status:  Discontinued       ? 2 g ?200 mL/hr over 30 Minutes Intravenous Every 24 hours 08/30/21 0036 08/30/21 0036  ? 08/29/21 2315  metroNIDAZOLE (FLAGYL) IVPB 500 mg       ?Note to Pharmacy: TO MAIN OR STAT!! In surgery now  ? 500 mg ?100 mL/hr over 60 Minutes Intravenous To Surgery 08/29/21 2302 08/29/21 2319  ? 08/29/21 2200  vancomycin (VANCOREADY)  IVPB 1250 mg/250 mL  Status:  Discontinued       ? 1,250 mg ?166.7 mL/hr over 90 Minutes Intravenous Every 12 hours 08/29/21 1857 09/01/21 1058  ? 08/29/21 2100  Vancomycin (VANCOCIN) 1,250 mg in sodium chloride 0.9 % 250 mL IVPB  Status:  Discontinued       ? 1,250 mg ?166.7 mL/hr over 90 Minutes Intravenous Every 12 hours 08/29/21 0837 08/29/21 1858  ? 08/29/21 0930  vancomycin (VANCOCIN) 2,000 mg in sodium chloride 0.9 %  500 mL IVPB       ? 2,000 mg ?260 mL/hr over 120 Minutes Intravenous  Once 08/29/21 0837 08/29/21 1257  ? 08/26/21 2000  cefTRIAXone (ROCEPHIN) 2 g in sodium chloride 0.9 % 100 mL IVPB  Status:  Discontinued       ? 2

## 2021-09-02 NOTE — Progress Notes (Signed)
PT Cancellation Note ? ?Patient Details ?Name: Kimberly Booker ?MRN: [id number] ?DOB: August 30, 1985 ? ? ?Cancelled Treatment:    Reason Eval/Treat Not Completed: Patient at procedure or test/unavailable.  In procedure for abscess drainage, retry at another time. ? ? ?Ivar Drape ?09/02/2021, 10:39 AM ? ?Samul Dada, PT PhD ?Acute Rehab Dept. Number: [facility] [phone] and [facility] [phone] ? ?

## 2021-09-02 NOTE — Plan of Care (Signed)

## 2021-09-02 NOTE — Progress Notes (Signed)
Nutrition Follow-up ? ?DOCUMENTATION CODES:  ?Not applicable ? ?INTERVENTION:  ?Once diet is advanced beyond NPO: ?-Ensure Enlive po BID, each supplement provides 350 kcal and 20 grams of protein. ?-MVI with minerals daily ? ?NUTRITION DIAGNOSIS:  ?Increased nutrient needs related to acute illness, post-op healing, wound healing as evidenced by estimated needs. ?ongoing ? ?GOAL:  ?Patient will meet greater than or equal to 90% of their needs ?progressing ? ?MONITOR:  ?Diet advancement, PO intake, Supplement acceptance, Labs, Weight trends, Skin ? ?REASON FOR ASSESSMENT:  ?Consult ?Assessment of nutrition requirement/status ? ?ASSESSMENT:  ?36 year old female with medical history of anxiety, depression, suicide attempt by drug OD, polysubstance abuse, R clavicle fx 8 months ago. She presented to the ED ongoing R shoulder pain. She had gone to another hospital 2 weeks ago and found to have R shoulder abscess which was treated with I&D and IV abx. She was dx with MRSA bacteremia and endocarditis was r/o on TEE on 3/16. She was transferred from [facility] to [facility] due to ongoing positive blood cultures despite treatment. Once at [facility], extensive imaging showed R clavicle osteomyelitis/phlegmon/abscess, widespread cervical,thoracic,lumbar discitis/osteomyelitis/epidural abscess/cord impingement. MRI showed bilateral iliopsoas abscess, R complex pleural effusion. ID, Orthopedics, and Neurosurgery following.  Currently on daptomycin. Plan for transthoracic echo. Neurosurgery planning for lumbar laminectomy for debridement of abscess, L4-L5. ? ?3/25 - s/p lumbar laminectomy/debridement of abscess, L4-L5 ?3/27 - s/p dx laparoscopy, exploratory laparotomy, graham patch repair of gastric ulcer perforation; NGT placed to suction ? ?Pt found to have abscesses in epidural space of cervical and lumbar spine in addition to iliopsoas abscess. ? ?Pt out of room at time of RD visit. Pt in IR for CT guided  bilateral iliopsoas abscess aspiration. NGT out. Pt to undergo surgery with Dr. Sharol Given tomorrow for R shoulder abscess/clavicle abscess washout.  ? ?PO Intake: 100% x 3 recorded meals  ? ?No BM documented x8 days -- recommend initiation of bowel regimen ? ?UOP: 636ml x24 hours ?JP Drain: 315ml x24 hours ?I/O: -8219ml since admit ? ?Current weight: 77.1 kg ?Admit weight: 87.2 kg  ? ?Medications: protonix, IV abx ?Labs: ?Recent Labs  ?Lab 08/31/21 ?0246 09/01/21 ?0117 09/02/21 ?0150  ?NA 136 136 132*  ?K 3.9 3.8 3.7  ?CL 104 105 99  ?CO2 25 24 23   ?BUN 11 6 6   ?CREATININE 0.39* 0.32* 0.31*  ?CALCIUM 8.1* 8.3* 8.2*  ?GLUCOSE 92 84 82  ? ?Diet Order:   ?Diet Order   ? ?       ?  Diet NPO time specified  Diet effective midnight       ?  ?  Diet NPO time specified  Diet effective now       ?  ? ?  ?  ? ?  ? ?EDUCATION NEEDS:  ?Not appropriate for education at this time ? ?Skin:  Skin Assessment: Skin Integrity Issues: ?Skin Integrity Issues:: Incisions ?Incisions: throat, back, abdomen ? ?Last BM:  3/22 ? ?Height:  ?Ht Readings from Last 1 Encounters:  ?09/02/21 5\' 7"  (1.702 m)  ? ?Weight:  ?Wt Readings from Last 1 Encounters:  ?09/02/21 77.1 kg  ? ?Ideal Body Weight:  61.4 kg ? ?BMI:  Body mass index is 26.63 kg/m?. ? ?Estimated Nutritional Needs:  ?Kcal:  2400-2600 kcal ?Protein:  120-130 grams ?Fluid:  >/= 2.5 L/day ? ? ? ? ?Theone Stanley., MS, RD, LDN (she/her/hers) ?RD pager number and weekend/on-call pager number located in [place]. ?

## 2021-09-02 NOTE — Progress Notes (Signed)
OT Cancellation Note ? ?Patient Details ?Name: Kimberly Booker ?MRN: [id number] ?DOB: 1986/04/07 ? ? ?Cancelled Treatment:    Reason Eval/Treat Not Completed: Patient declined, post procedure this date.  OT to continue efforts.   ? ?Jarmon Javid D Alizea Pell ?09/02/2021, 3:13 PM ?

## 2021-09-03 ENCOUNTER — Inpatient Hospital Stay (HOSPITAL_COMMUNITY): Payer: Medicaid Other | Admitting: Certified Registered"

## 2021-09-03 ENCOUNTER — Encounter (HOSPITAL_COMMUNITY): Payer: Self-pay | Admitting: Radiology

## 2021-09-03 ENCOUNTER — Encounter (HOSPITAL_COMMUNITY)
Admission: AD | Disposition: A | Discharge status: Home / Self Care | Payer: Self-pay | Source: Other Acute Inpatient Hospital | Attending: Internal Medicine

## 2021-09-03 DIAGNOSIS — M869 Osteomyelitis, unspecified: Secondary | ICD-10-CM

## 2021-09-03 DIAGNOSIS — F418 Other specified anxiety disorders: Secondary | ICD-10-CM

## 2021-09-03 HISTORY — PX: APPLICATION OF WOUND VAC: SHX5189

## 2021-09-03 HISTORY — PX: I & D EXTREMITY: SHX5045

## 2021-09-03 LAB — CBC
HCT: 31.2 % — ABNORMAL LOW (ref 36.0–46.0)
Hemoglobin: 10.4 g/dL — ABNORMAL LOW (ref 12.0–15.0)
MCH: 30.2 pg (ref 26.0–34.0)
MCHC: 33.3 g/dL (ref 30.0–36.0)
MCV: 90.7 fL (ref 80.0–100.0)
Platelets: 608 10*3/uL — ABNORMAL HIGH (ref 150–400)
RBC: 3.44 MIL/uL — ABNORMAL LOW (ref 3.87–5.11)
RDW: 14.2 % (ref 11.5–15.5)
WBC: 19.2 10*3/uL — ABNORMAL HIGH (ref 4.0–10.5)
nRBC: 0 % (ref 0.0–0.2)

## 2021-09-03 SURGERY — IRRIGATION AND DEBRIDEMENT EXTREMITY
Anesthesia: General | Site: Shoulder | Laterality: Right

## 2021-09-03 MED ORDER — LIDOCAINE 2% (20 MG/ML) 5 ML SYRINGE
INTRAMUSCULAR | Status: AC
  Filled 2021-09-03: qty 5

## 2021-09-03 MED ORDER — PHENYLEPHRINE 40 MCG/ML (10ML) SYRINGE FOR IV PUSH (FOR BLOOD PRESSURE SUPPORT)
PREFILLED_SYRINGE | INTRAVENOUS | Status: AC
  Filled 2021-09-03: qty 30

## 2021-09-03 MED ORDER — CHLORHEXIDINE GLUCONATE 0.12 % MT SOLN
OROMUCOSAL | Status: AC
  Filled 2021-09-03: qty 15

## 2021-09-03 MED ORDER — DEXAMETHASONE SODIUM PHOSPHATE 10 MG/ML IJ SOLN
INTRAMUSCULAR | Status: DC | PRN
Start: 2021-09-03 — End: 2021-09-03
  Administered 2021-09-03: 5 mg via INTRAVENOUS

## 2021-09-03 MED ORDER — LIDOCAINE 2% (20 MG/ML) 5 ML SYRINGE
INTRAMUSCULAR | Status: DC | PRN
  Administered 2021-09-03: 100 mg via INTRAVENOUS

## 2021-09-03 MED ORDER — POLYETHYLENE GLYCOL 3350 17 G PO PACK: 17.0000 g | PACK | Freq: Every day | ORAL | Status: DC | PRN

## 2021-09-03 MED ORDER — METOCLOPRAMIDE HCL 5 MG/ML IJ SOLN: 5.0000 mg | Freq: Three times a day (TID) | INTRAMUSCULAR | Status: DC | PRN

## 2021-09-03 MED ORDER — ACETAMINOPHEN 10 MG/ML IV SOLN
1000.0000 mg | Freq: Once | INTRAVENOUS | Status: DC | PRN
  Administered 2021-09-03: 1000 mg via INTRAVENOUS

## 2021-09-03 MED ORDER — METOCLOPRAMIDE HCL 5 MG PO TABS: 5.0000 mg | ORAL_TABLET | Freq: Three times a day (TID) | ORAL | Status: DC | PRN

## 2021-09-03 MED ORDER — VANCOMYCIN HCL 1000 MG IV SOLR
INTRAVENOUS | Status: AC
  Filled 2021-09-03: qty 20

## 2021-09-03 MED ORDER — 0.9 % SODIUM CHLORIDE (POUR BTL) OPTIME
TOPICAL | Status: DC | PRN
  Administered 2021-09-03: 1000 mL

## 2021-09-03 MED ORDER — FENTANYL CITRATE (PF) 100 MCG/2ML IJ SOLN
INTRAMUSCULAR | Status: DC | PRN
Start: 2021-09-03 — End: 2021-09-03
  Administered 2021-09-03 (×5): 50 ug via INTRAVENOUS

## 2021-09-03 MED ORDER — FENTANYL CITRATE (PF) 250 MCG/5ML IJ SOLN
INTRAMUSCULAR | Status: AC
  Filled 2021-09-03: qty 5

## 2021-09-03 MED ORDER — ONDANSETRON HCL 4 MG/2ML IJ SOLN
4.0000 mg | Freq: Four times a day (QID) | INTRAMUSCULAR | Status: DC | PRN
  Administered 2021-09-03: 4 mg via INTRAVENOUS
  Filled 2021-09-03: qty 2

## 2021-09-03 MED ORDER — CHLORHEXIDINE GLUCONATE 0.12 % MT SOLN
15.0000 mL | Freq: Once | OROMUCOSAL | Status: AC
  Filled 2021-09-03: qty 15

## 2021-09-03 MED ORDER — MIDAZOLAM HCL 5 MG/5ML IJ SOLN
INTRAMUSCULAR | Status: DC | PRN
  Administered 2021-09-03: 2 mg via INTRAVENOUS

## 2021-09-03 MED ORDER — HYDROMORPHONE HCL 1 MG/ML IJ SOLN
INTRAMUSCULAR | Status: DC | PRN
  Administered 2021-09-03: .5 mg via INTRAVENOUS

## 2021-09-03 MED ORDER — VANCOMYCIN HCL 1000 MG IV SOLR
INTRAVENOUS | Status: DC | PRN
  Administered 2021-09-03: 1000 mg via TOPICAL

## 2021-09-03 MED ORDER — ONDANSETRON HCL 4 MG PO TABS: 4.0000 mg | ORAL_TABLET | Freq: Four times a day (QID) | ORAL | Status: DC | PRN

## 2021-09-03 MED ORDER — MIDAZOLAM HCL 2 MG/2ML IJ SOLN
INTRAMUSCULAR | Status: AC
  Filled 2021-09-03: qty 2

## 2021-09-03 MED ORDER — HYDROMORPHONE HCL 1 MG/ML IJ SOLN
INTRAMUSCULAR | Status: AC
  Filled 2021-09-03: qty 1

## 2021-09-03 MED ORDER — DEXAMETHASONE SODIUM PHOSPHATE 10 MG/ML IJ SOLN
INTRAMUSCULAR | Status: AC
  Filled 2021-09-03: qty 1

## 2021-09-03 MED ORDER — HYDROMORPHONE HCL 1 MG/ML IJ SOLN
INTRAMUSCULAR | Status: AC
Start: 2021-09-03 — End: 2021-09-04
  Filled 2021-09-03: qty 1

## 2021-09-03 MED ORDER — ONDANSETRON HCL 4 MG/2ML IJ SOLN
INTRAMUSCULAR | Status: AC
  Filled 2021-09-03: qty 2

## 2021-09-03 MED ORDER — PROPOFOL 10 MG/ML IV BOLUS
INTRAVENOUS | Status: AC
  Filled 2021-09-03: qty 20

## 2021-09-03 MED ORDER — BISACODYL 10 MG RE SUPP: 10.0000 mg | Freq: Every day | RECTAL | Status: DC | PRN

## 2021-09-03 MED ORDER — METHOCARBAMOL 1000 MG/10ML IJ SOLN
500.0000 mg | Freq: Four times a day (QID) | INTRAVENOUS | Status: DC | PRN
  Filled 2021-09-03: qty 5

## 2021-09-03 MED ORDER — DOCUSATE SODIUM 100 MG PO CAPS
100.0000 mg | ORAL_CAPSULE | Freq: Two times a day (BID) | ORAL | Status: DC
  Administered 2021-09-03 – 2021-09-06 (×6): 100 mg via ORAL
  Filled 2021-09-03 (×6): qty 1

## 2021-09-03 MED ORDER — PROPOFOL 10 MG/ML IV BOLUS
INTRAVENOUS | Status: DC | PRN
Start: 2021-09-03 — End: 2021-09-03
  Administered 2021-09-03: 140 mg via INTRAVENOUS

## 2021-09-03 MED ORDER — HYDROMORPHONE HCL 1 MG/ML IJ SOLN
INTRAMUSCULAR | Status: AC
  Filled 2021-09-03: qty 0.5

## 2021-09-03 MED ORDER — DROPERIDOL 2.5 MG/ML IJ SOLN: 0.6250 mg | Freq: Once | INTRAMUSCULAR | Status: DC | PRN

## 2021-09-03 MED ORDER — ACETAMINOPHEN 10 MG/ML IV SOLN
INTRAVENOUS | Status: AC
  Filled 2021-09-03: qty 100

## 2021-09-03 MED ORDER — HYDROMORPHONE HCL 1 MG/ML IJ SOLN
0.2500 mg | INTRAMUSCULAR | Status: DC | PRN
  Administered 2021-09-03 (×4): 0.5 mg via INTRAVENOUS

## 2021-09-03 MED ORDER — METHOCARBAMOL 500 MG PO TABS
500.0000 mg | ORAL_TABLET | Freq: Four times a day (QID) | ORAL | Status: DC | PRN
  Administered 2021-09-07 – 2021-09-09 (×3): 500 mg via ORAL
  Filled 2021-09-03 (×3): qty 1

## 2021-09-03 MED ORDER — ONDANSETRON HCL 4 MG/2ML IJ SOLN
INTRAMUSCULAR | Status: DC | PRN
  Administered 2021-09-03: 4 mg via INTRAVENOUS

## 2021-09-03 MED ORDER — SODIUM CHLORIDE 0.9 % IV SOLN: INTRAVENOUS | Status: DC

## 2021-09-03 SURGICAL SUPPLY — 32 items
BAG COUNTER SPONGE SURGICOUNT (BAG) IMPLANT
BLADE SURG 21 STRL SS (BLADE) ×2 IMPLANT
CANISTER WOUNDNEG PRESSURE 500 (CANNISTER) ×1 IMPLANT
CNTNR URN SCR LID CUP LEK RST (MISCELLANEOUS) IMPLANT
CONT SPEC 4OZ STRL OR WHT (MISCELLANEOUS) ×1
COVER SURGICAL LIGHT HANDLE (MISCELLANEOUS) ×4 IMPLANT
DERMABOND ADVANCED (GAUZE/BANDAGES/DRESSINGS) ×2
DERMABOND ADVANCED .7 DNX12 (GAUZE/BANDAGES/DRESSINGS) IMPLANT
DRAPE DERMATAC (DRAPES) ×2 IMPLANT
DRAPE ORTHO SPLIT 77X108 STRL (DRAPES) ×1
DRAPE SURG ORHT 6 SPLT 77X108 (DRAPES) IMPLANT
DRAPE U-SHAPE 47X51 STRL (DRAPES) ×2 IMPLANT
DRESSING VERAFLO CLEANS CC MED (GAUZE/BANDAGES/DRESSINGS) IMPLANT
DRSG VERAFLO CLEANSE CC MED (GAUZE/BANDAGES/DRESSINGS) ×2
ELECT REM PT RETURN 9FT ADLT (ELECTROSURGICAL)
ELECTRODE REM PT RTRN 9FT ADLT (ELECTROSURGICAL) IMPLANT
GLOVE SURG ORTHO LTX SZ9 (GLOVE) ×2 IMPLANT
GLOVE SURG UNDER POLY LF SZ9 (GLOVE) ×2 IMPLANT
GOWN STRL REUS W/ TWL XL LVL3 (GOWN DISPOSABLE) ×2 IMPLANT
GOWN STRL REUS W/TWL XL LVL3 (GOWN DISPOSABLE) ×2
GRAFT SKIN WND OMEGA3 SB 7X10 (Tissue) ×1 IMPLANT
KIT BASIN OR (CUSTOM PROCEDURE TRAY) ×2 IMPLANT
KIT TURNOVER KIT B (KITS) ×2 IMPLANT
MANIFOLD NEPTUNE II (INSTRUMENTS) ×2 IMPLANT
NS IRRIG 1000ML POUR BTL (IV SOLUTION) ×2 IMPLANT
PACK ORTHO EXTREMITY (CUSTOM PROCEDURE TRAY) ×2 IMPLANT
PAD ARMBOARD 7.5X6 YLW CONV (MISCELLANEOUS) ×4 IMPLANT
PAD NEG PRESSURE SENSATRAC (MISCELLANEOUS) ×1 IMPLANT
SUT ETHILON 2 0 PSLX (SUTURE) ×2 IMPLANT
TOWEL GREEN STERILE (TOWEL DISPOSABLE) ×2 IMPLANT
TUBE CONNECTING 12X1/4 (SUCTIONS) ×2 IMPLANT
YANKAUER SUCT BULB TIP NO VENT (SUCTIONS) ×2 IMPLANT

## 2021-09-03 NOTE — Progress Notes (Signed)
OT Cancellation Note ? ?Patient Details ?Name: Kimberly Booker ?MRN: [id number] ?DOB: [birth year] ? ? ?Cancelled Treatment:    Reason Eval/Treat Not Completed: Patient at procedure or test/ unavailable (pt in surgery) ? ?[name] ?09/03/2021, 12:39 PM ?Martie Round, OTR/L ?Acute Rehabilitation Services ?Pager: [phone] ?Office: [phone]  ?

## 2021-09-03 NOTE — Anesthesia Procedure Notes (Signed)
Procedure Name: LMA Insertion ?Date/Time: 09/03/2021 1:20 PM ?Performed by: Marny Lowenstein, CRNA ?Pre-anesthesia Checklist: Patient identified, Emergency Drugs available, Suction available and Patient being monitored ?Patient Re-evaluated:Patient Re-evaluated prior to induction ?Oxygen Delivery Method: Circle system utilized ?Preoxygenation: Pre-oxygenation with 100% oxygen ?Induction Type: IV induction ?Ventilation: Mask ventilation without difficulty ?LMA: LMA inserted ?LMA Size: 4.0 ?Number of attempts: 1 ?Placement Confirmation: positive ETCO2 and breath sounds checked- equal and bilateral ?Tube secured with: Tape ?Dental Injury: Teeth and Oropharynx as per pre-operative assessment  ? ? ? ? ?

## 2021-09-03 NOTE — Interval H&P Note (Signed)
History and Physical Interval Note: ? ?09/03/2021 ?6:47 AM ? ?Kimberly Booker  has presented today for surgery, with the diagnosis of Osteomyelitis Right Clavicle.  The various methods of treatment have been discussed with the patient and family. After consideration of risks, benefits and other options for treatment, the patient has consented to  Procedure(s): ?DEBRIDEMENT RIGHT CLAVICLE (Right) as a surgical intervention.  The patient's history has been reviewed, patient examined, no change in status, stable for surgery.  I have reviewed the patient's chart and labs.  Questions were answered to the patient's satisfaction.   ? ? ?[name] ? ? ?

## 2021-09-03 NOTE — Progress Notes (Signed)
Patient ID: AURALIA MEIRING, female   DOB: [birth year], 36 y.o.   MRN: [id number] ?1 Day Post-Op  ?  ?Subjective: ?Hungry, NPO for OR ?ROS negative except as listed above. ?Objective: ?Vital signs in last 24 hours: ?Temp:  [97.6 ?F (36.4 ?C)-98.8 ?F (37.1 ?C)] 98 ?F (36.7 ?C) (03/31 0750) ?Pulse Rate:  [87-105] 96 (03/31 0750) ?Resp:  [17-26] 17 (03/31 0750) ?BP: (111-131)/(66-93) 113/75 (03/31 0750) ?SpO2:  [94 %-100 %] 95 % (03/31 0750) ?Weight:  [77.1 kg] 77.1 kg (03/30 1010) ?Last BM Date : 08/25/21 ? ?Intake/Output from previous day: ?03/30 0701 - 03/31 0700 ?In: 1142 [I.V.:500; IV [id number] ?Out: 725 [Urine:500; Drains:225] ?Intake/Output this shift: ?No intake/output data recorded. ? ?General appearance: alert and cooperative ?Resp: clear to auscultation bilaterally ?GI: incision OK with loose staples ?JP 100SS ?Lab Results: ?CBC  ?Recent Labs  ?  09/02/21 ?0150 09/03/21 ?0339  ?WBC 16.9* 19.2*  ?HGB 9.0* 10.4*  ?HCT 27.5* 31.2*  ?PLT 532* 608*  ? ?BMET ?Recent Labs  ?  09/01/21 ?0117 09/02/21 ?0150  ?NA 136 132*  ?K 3.8 3.7  ?CL 105 99  ?CO2 24 23  ?GLUCOSE 84 82  ?BUN 6 6  ?CREATININE 0.32* 0.31*  ?CALCIUM 8.3* 8.2*  ? ?PT/INR ?No results for input(s): LABPROT, INR in the last 72 hours. ?ABG ?No results for input(s): PHART, HCO3 in the last 72 hours. ? ?Invalid input(s): PCO2, PO2 ? ?Studies/Results: ?CT ASPIRATION ? ?Result Date: 09/02/2021 ?INDICATION: 37 year old woman with bilateral iliopsoas abscesses presents to IR for CT-guided aspiration. EXAM: CT-guided bilateral iliopsoas abscess aspiration MEDICATIONS: The patient is currently admitted to the hospital and receiving intravenous antibiotics. The antibiotics were administered within an appropriate time frame prior to the initiation of the procedure. ANESTHESIA/SEDATION: General anesthesia performed and monitored by the anesthesia team. COMPLICATIONS: None immediate. PROCEDURE: Informed written consent was obtained from the patient after a thorough  discussion of the procedural risks, benefits and alternatives. All questions were addressed. Maximal Sterile Barrier Technique was utilized including caps, mask, sterile gowns, sterile gloves, sterile drape, hand hygiene and skin antiseptic. A timeout was performed prior to the initiation of the procedure. Patient positioned supine on the procedure table. Contrast enhanced CT performed to delineate safe approach to the right iliopsoas abscess. Once a safe approach window was identified, the overlying skin was prepped and draped in usual fashion. Following local lidocaine administration, 17 gauge needle was advanced into the abscess and 8 mL of purulent material was aspirated. Samples were sent for Gram stain and culture. Patient was again scanned with contrast enhanced CT to determine safe approach to the left iliopsoas abscess. Once a safe window was identified, 17 gauge needle was advanced into the left iliopsoas abscess and 1 mL of purulent material was aspirated. The patient on the procedures well without immediate complication. IMPRESSION: 1. CT-guided aspiration of right iliopsoas abscess yielding 8 mL of purulent material. Sample sent for Gram stain and culture. 2. CT-guided aspiration of left iliopsoas abscess healed in 1 mL of purulent material. Electronically Signed   By: Sharen Heck  Mir M.D.   On: 09/02/2021 15:25  ? ?CT ASPIRATION ? ?Result Date: 09/02/2021 ?INDICATION: 36 year old woman with bilateral iliopsoas abscesses presents to IR for CT-guided aspiration. EXAM: CT-guided bilateral iliopsoas abscess aspiration MEDICATIONS: The patient is currently admitted to the hospital and receiving intravenous antibiotics. The antibiotics were administered within an appropriate time frame prior to the initiation of the procedure. ANESTHESIA/SEDATION: General anesthesia performed and monitored by the anesthesia team. COMPLICATIONS:  None immediate. PROCEDURE: Informed written consent was obtained from the patient  after a thorough discussion of the procedural risks, benefits and alternatives. All questions were addressed. Maximal Sterile Barrier Technique was utilized including caps, mask, sterile gowns, sterile gloves, sterile drape, hand hygiene and skin antiseptic. A timeout was performed prior to the initiation of the procedure. Patient positioned supine on the procedure table. Contrast enhanced CT performed to delineate safe approach to the right iliopsoas abscess. Once a safe approach window was identified, the overlying skin was prepped and draped in usual fashion. Following local lidocaine administration, 17 gauge needle was advanced into the abscess and 8 mL of purulent material was aspirated. Samples were sent for Gram stain and culture. Patient was again scanned with contrast enhanced CT to determine safe approach to the left iliopsoas abscess. Once a safe window was identified, 17 gauge needle was advanced into the left iliopsoas abscess and 1 mL of purulent material was aspirated. The patient on the procedures well without immediate complication. IMPRESSION: 1. CT-guided aspiration of right iliopsoas abscess yielding 8 mL of purulent material. Sample sent for Gram stain and culture. 2. CT-guided aspiration of left iliopsoas abscess healed in 1 mL of purulent material. Electronically Signed   By: Sharen Heck  Mir M.D.   On: 09/02/2021 15:25   ? ?Anti-infectives: ?Anti-infectives (From admission, onward)  ? ? Start     Dose/Rate Route Frequency Ordered Stop  ? 09/01/21 2200  vancomycin (VANCOREADY) IVPB 1750 mg/350 mL       ? 1,750 mg ?175 mL/hr over 120 Minutes Intravenous Every 12 hours 09/01/21 1058    ? 08/30/21 1000  metroNIDAZOLE (FLAGYL) IVPB 500 mg  Status:  Discontinued       ? 500 mg ?100 mL/hr over 60 Minutes Intravenous Every 12 hours 08/30/21 0036 08/30/21 1708  ? 08/30/21 0600  metroNIDAZOLE (FLAGYL) IVPB 500 mg  Status:  Discontinued       ? 500 mg ?100 mL/hr over 60 Minutes Intravenous On call to O.R.  08/30/21 0220 08/30/21 0229  ? 08/30/21 0245  ciprofloxacin (CIPRO) IVPB 400 mg  Status:  Discontinued       ? 400 mg ?200 mL/hr over 60 Minutes Intravenous Every 12 hours 08/30/21 0037 08/30/21 1708  ? 08/30/21 0045  cefTRIAXone (ROCEPHIN) 2 g in sodium chloride 0.9 % 100 mL IVPB  Status:  Discontinued       ? 2 g ?200 mL/hr over 30 Minutes Intravenous Every 24 hours 08/30/21 0036 08/30/21 0036  ? 08/29/21 2315  metroNIDAZOLE (FLAGYL) IVPB 500 mg       ?Note to Pharmacy: TO MAIN OR STAT!! In surgery now  ? 500 mg ?100 mL/hr over 60 Minutes Intravenous To Surgery 08/29/21 2302 08/29/21 2319  ? 08/29/21 2200  vancomycin (VANCOREADY) IVPB 1250 mg/250 mL  Status:  Discontinued       ? 1,250 mg ?166.7 mL/hr over 90 Minutes Intravenous Every 12 hours 08/29/21 1857 09/01/21 1058  ? 08/29/21 2100  Vancomycin (VANCOCIN) 1,250 mg in sodium chloride 0.9 % 250 mL IVPB  Status:  Discontinued       ? 1,250 mg ?166.7 mL/hr over 90 Minutes Intravenous Every 12 hours 08/29/21 0837 08/29/21 1858  ? 08/29/21 0930  vancomycin (VANCOCIN) 2,000 mg in sodium chloride 0.9 % 500 mL IVPB       ? 2,000 mg ?260 mL/hr over 120 Minutes Intravenous  Once 08/29/21 0837 08/29/21 1257  ? 08/26/21 2000  cefTRIAXone (ROCEPHIN) 2 g in sodium  chloride 0.9 % 100 mL IVPB  Status:  Discontinued       ? 2 g ?200 mL/hr over 30 Minutes Intravenous Every 24 hours 08/26/21 1614 08/28/21 1736  ? 08/26/21 2000  DAPTOmycin (CUBICIN) 700 mg in sodium chloride 0.9 % IVPB  Status:  Discontinued       ? 8 mg/kg ? 87.2 kg ?128 mL/hr over 30 Minutes Intravenous Daily 08/26/21 1819 08/29/21 0842  ? ?  ? ? ?Assessment/Plan: ?POD 5, S/P Dx laparoscopy, exploratory laparotomy, graham patch repair of gastric perforation 3/26 Dr. Michaelle Birks for perforated gastric ulcer in posterior stomach ?- h.pylori negative ?- clears after surgery today, adv tomorrow if tolerates ?- monitor drain output (100) ?- PPI BID  ?- IS/Pulm toilet, OOB as able, PT/OT consults ? ? ?FEN: CLD,  IVFs ?ID: per ID, Flagyl, Vanc, Cipro  ?VTE: SCD's, Lovenox  ?Foley: out ? ?MRSA bacteremia ?R shoulder abscess/clavicle abscess - OR today by Dr. Sharol Given ?B psoas abscess - S/P drainage by IR 3/30 ?Epidural absce

## 2021-09-03 NOTE — Progress Notes (Signed)
Pharmacy Antibiotic Note ? ?Kimberly Booker is a 36 y.o. female admitted on 08/26/2021 with  disseminated MRSA bacteremia (diffuse discitis/osteomyelitis/epidural abscess of cervical, thoracic and lumbar spine, bilateral septic arthritis, osteomyelitis of R clavicle, R lung empyema) .  ? ?Patient was initially started on vancomycin 3/13-3/15 along with ceftriaxone 3/13-3/24. Vancomycin was transitioned to daptomycin and given 3/16-3/25. Pt was transferred to [facility] on 08/26/21. ID has consulted pharmacy dose vancomycin for coverage of MRSA R lung empyema.  ? ?Patient is s/p placement of IR drains into iliopsoas abscesses 3/30 and debridement of right clavicle 3/31. Scr remains stable.  ? ?Plan: ?Continue vancomycin 1750mg  IV q12h (eAUC ~499) ?Goal AUC 400-550 ?Would plan repeat levels early next week  ?Monitor renal function and clinical signs of infection  ? ?Height: 5\' 7"  (170.2 cm) ?Weight: 77.1 kg (170 lb) ?IBW/kg (Calculated) : 61.6 ? ?Temp (24hrs), Avg:98 ?F (36.7 ?C), Min:97.6 ?F (36.4 ?C), Max:98.4 ?F (36.9 ?C) ? ?Recent Labs  ?Lab 08/29/21 ? 08/29/21 ?2153 08/31/21 ?0246 09/01/21 ?0117 09/01/21 ?09/03/21 09/02/21 ?0150 09/03/21 ?09/04/21  ?WBC 15.8* 18.3* 12.9* 12.9*  --  16.9* 19.2*  ?CREATININE 0.34* 0.34* 0.39* 0.32*  --  0.31*  --   ?VANCOTROUGH  --   --   --   --  8*  --   --   ?VANCOPEAK  --   --   --  20*  --   --   --   ? ?  ?Estimated Creatinine Clearance: 104.1 mL/min (A) (by C-G formula based on SCr of 0.31 mg/dL (L)).   ? ?Allergies  ?Allergen Reactions  ? Amoxicillin Swelling  ?  Throat closes  ? ? ?Antimicrobials this admission: ?Vanc 3/13 >> 3/15 ?Ceftriaxone 3/13 >> 3/24 ?Daptomycin 3/16 >> 3/25 ?Vanc 3/26 >> ? ?Microbiology results: ?3/12 Bcx x2: 4/4 MRSA ?3/12 Chest wound cx: MRSA ?3/14 Bcx: 4/4 MRSA ?3/22 Bcx: ngtd x3d ?3/24 Bcx: ngtd x2d ?3/25 lumbar epidural wound/abscess: MRSA ? ? ?Thank you for allowing pharmacy to be a part of this patient?s care. ? ? ?4/24, PharmD, BCPS,  BCIDP ?Infectious Diseases Clinical Pharmacist ?Phone: [phone] ?Please check AMION for all [facility] pharmacy phone numbers ?After 10:00 PM call main pharmacy [phone] ? ? ?

## 2021-09-03 NOTE — Anesthesia Postprocedure Evaluation (Signed)
Anesthesia Post Note ? ?Patient: Kimberly Booker ? ?Procedure(s) Performed: IR WITH ANESTHESIA ? ?  ? ?Patient location during evaluation: PACU ?Anesthesia Type: General ?Level of consciousness: patient cooperative and awake ?Pain management: pain level controlled ?Vital Signs Assessment: post-procedure vital signs reviewed and stable ?Respiratory status: spontaneous breathing, nonlabored ventilation, respiratory function stable and patient connected to nasal cannula oxygen ?Cardiovascular status: blood pressure returned to baseline and stable ?Postop Assessment: no apparent nausea or vomiting ?Anesthetic complications: no ? ? ?No notable events documented. ? ?Last Vitals:  ?Vitals:  ? 09/03/21 0359 09/03/21 0750  ?BP: 114/71 113/75  ?Pulse: 97 96  ?Resp: 18 17  ?Temp: 36.7 ?C 36.7 ?C  ?SpO2: 98% 95%  ?  ?Last Pain:  ?Vitals:  ? 09/03/21 1212  ?TempSrc:   ?PainSc: 2   ? ? ?  ?  ?  ?  ?  ?  ? ?Jasline Buskirk ? ? ? ? ?

## 2021-09-03 NOTE — Op Note (Signed)
09/03/2021 ? ?2:37 PM ? ?PATIENT:  Kimberly Booker   ? ?PRE-OPERATIVE DIAGNOSIS:  Osteomyelitis Right Clavicle ? ?POST-OPERATIVE DIAGNOSIS:  Same ? ?PROCEDURE:  DEBRIDEMENT RIGHT CLAVICLE, APPLICATION OF WOUND VAC ? ?SURGEON:  [name], MD ? ?PHYSICIAN ASSISTANT:None ?ANESTHESIA:   General ? ?PREOPERATIVE INDICATIONS:  [name] is a  36 y.o. female with a diagnosis of Osteomyelitis Right Clavicle who failed conservative measures and elected for surgical management.   ? ?The risks benefits and alternatives were discussed with the patient preoperatively including but not limited to the risks of infection, bleeding, nerve injury, cardiopulmonary complications, the need for revision surgery, among others, and the patient was willing to proceed. ? ?OPERATIVE IMPLANTS: Kerecis tissue graft 7 x 10 cm. ?Cleanse choice wound VAC sponge x1. ?Vancomycin powder 1 g. ? ?@ENCIMAGES @ ? ?OPERATIVE FINDINGS: Patient had necrotic bone that was resected and sent for cultures. ?A thin layer of healthy viable bone was left intact to protect the posterior neurovascular structures. ? ?The wound was not closed and anticipate healing with secondary intention. ? ?OPERATIVE PROCEDURE: Patient brought the operating room and underwent a general anesthetic.  After adequate levels anesthesia were obtained patient's right upper extremity was prepped using Betadine paint draped into a sterile field a timeout was called.  Elliptical incision was made around the 2 ulcers from the medial and lateral aspect of the clavicle the infected tissue was resected in 1 block of tissue.  Patient had a large piece of nonviable clavicle mid substance that was resected back to a bony rim posteriorly.  This bone had good petechial bleeding was healthy and viable.  Concern was to provide a mechanical barrier to the neurovascular bundles posterior to the clavicle.  The wound was irrigated normal saline hemostasis was obtained.  Vancomycin powder 1 g was  applied.  A 3 layer Kerecis graft was then applied over the clavicle and soft tissue to provide a posterior soft tissue envelope.  This was then covered with a Praveena plus cleanse choice wound VAC sponge covered with Dermabond and derma tack this had a good suction fit patient was extubated taken the PACU in stable condition. ? ? ?DISCHARGE PLANNING: ? ?Antibiotic duration: Continue antibiotics. ?Tissue cultures are sent. ? ?Weightbearing: Not applicable ? ?Pain medication: Continue current protocol ? ?Dressing care/ Wound VAC: Continue wound VAC for 1 week ? ?Ambulatory devices: Not applicable ? ?Discharge to: Discharge planning based on therapy.  Patient will discharge with 1 week of a portable Praveena wound VAC pump after that time start dressing changes. ? ?Follow-up: In the office 1 week post operative. ? ? ? ? ? ? ?  ?

## 2021-09-03 NOTE — Anesthesia Preprocedure Evaluation (Addendum)
Anesthesia Evaluation  ?Patient identified by MRN, date of birth, ID band ?Patient awake ? ? ? ?Reviewed: ?Allergy & Precautions, NPO status , Patient's Chart, lab work & pertinent test results ? ?History of Anesthesia Complications ?Negative for: history of anesthetic complications ? ?Airway ?Mallampati: II ? ?TM Distance: >3 FB ?Neck ROM: Full ? ? ? Dental ? ?(+) Poor Dentition, Missing, Chipped,  ?  ?Pulmonary ?Current Smoker and Patient abstained from smoking.,  ?  ?Pulmonary exam normal ? ? ? ? ? ? ? Cardiovascular ?negative cardio ROS ?Normal cardiovascular exam ? ? ?  ?Neuro/Psych ?Anxiety Depression negative neurological ROS ?   ? GI/Hepatic ?negative GI ROS, (+)  ?  ? substance abuse ? IV drug use,   ?Endo/Other  ?negative endocrine ROS ? Renal/GU ?negative Renal ROS  ?negative genitourinary ?  ?Musculoskeletal ?Osteomyelitis Right Clavicle/pathologic fracture  ? Abdominal ?  ?Peds ? Hematology ?Hgb 10.4, Plt 608   ?Anesthesia Other Findings ?Day of surgery medications reviewed with patient. ? Reproductive/Obstetrics ?negative OB ROS ? ?  ? ? ? ? ? ? ? ? ? ? ? ? ? ?  ?  ? ? ? ? ? ? ? ?Anesthesia Physical ?Anesthesia Plan ? ?ASA: 3 ? ?Anesthesia Plan: General  ? ?Post-op Pain Management: Toradol IV (intra-op)*  ? ?Induction: Intravenous ? ?PONV Risk Score and Plan: 2 and Treatment may vary due to age or medical condition, Midazolam, Dexamethasone and Ondansetron ? ?Airway Management Planned: LMA ? ?Additional Equipment: None ? ?Intra-op Plan:  ? ?Post-operative Plan: Extubation in OR ? ?Informed Consent: I have reviewed the patients History and Physical, chart, labs and discussed the procedure including the risks, benefits and alternatives for the proposed anesthesia with the patient or authorized representative who has indicated his/her understanding and acceptance.  ? ? ? ?Dental advisory given ? ?Plan Discussed with: CRNA ? ?Anesthesia Plan Comments:    ? ? ? ? ? ? ?Anesthesia Quick Evaluation ? ?

## 2021-09-03 NOTE — Progress Notes (Signed)
Pt was assessed at the surgical site on the right superior aspect of right shoulder and was found to have a clot formed under the wound vac bandage which was interfering w/ the wound vac suctioning. Per Dr Sharol Given, supplies obtained and new track pad and surrounding dressing, w/ new canister applied. Pt experienced a lot of pain and anxiety during this bandage change however is now resting comfortably and IV ativan being given by night shift nurse.  ?

## 2021-09-03 NOTE — TOC Initial Note (Signed)
Transition of Care (TOC) - Initial/Assessment Note  ? ? ?Patient Details  ?Name: Kimberly Booker ?MRN: [id number] ?Date of Birth: December 29, 1985 ? ?Transition of Care (TOC) CM/SW Contact:    ?Lockie Pares, RN ?Phone Number: ?09/03/2021, 2:32 PM ? ?Clinical Narrative:                 ?60 yea old patient with a history of anxiety depression, Polysubstance abuse , had shoulder injury 8 months ago and was lost to follow up. Started having more pain and was admitted to [facility] in [place], [place] for bacteremia. Osteomyelitis. She was transferred to [facility] for care and specialty ID. ? She is scheduled to go to OR today for shoulder surgical intervention.  ?She is not a candidate for outpatient IV therapy due to her polysubstance abuse. She will need 8 weeks of IV vancomycin. If she needed home health post, we could try to find a [place] company that [employer] and accepts Medicaid. Look at [facility] as a Medicaid provider. The fax number is [phone] and their phone number is [phone]. ? CM will continue to follow for needs, recommendations, and transitions of care. ? ? .  ? ?Expected Discharge Plan: Home/Self Care ?Barriers to Discharge:  (Is not candidate for Home IV antibiotics) ? ? ?Patient Goals and CMS Choice ?  ?  ?  ? ?Expected Discharge Plan and Services ?Expected Discharge Plan: Home/Self Care ?  ?Discharge Planning Services: CM Consult ?  ?Living arrangements for the past 2 months: Single Family Home ?                ?  ?  ?  ?  ?  ?  ?  ?  ?  ?  ? ?Prior Living Arrangements/Services ?Living arrangements for the past 2 months: Single Family Home ?  ?Patient language and need for interpreter reviewed:: Yes ?       ?Need for Family Participation in Patient Care: Yes (Comment) ?Care giver support system in place?: Yes (comment) ?  ?Criminal Activity/Legal Involvement Pertinent to Current Situation/Hospitalization: No - Comment as needed ? ?Activities of Daily Living ?Home Assistive  Devices/Equipment: None ?ADL Screening (condition at time of admission) ?Patient's cognitive ability adequate to safely complete daily activities?: Yes ?Is the patient deaf or have difficulty hearing?: No ?Does the patient have difficulty seeing, even when wearing glasses/contacts?: No ?Does the patient have difficulty concentrating, remembering, or making decisions?: No ?Patient able to express need for assistance with ADLs?: Yes ?Does the patient have difficulty dressing or bathing?: No ?Independently performs ADLs?: Yes (appropriate for developmental age) ?Does the patient have difficulty walking or climbing stairs?: Yes ?Weakness of Legs: Both ?Weakness of Arms/Hands: None ? ?Permission Sought/Granted ?  ?  ?   ?   ?   ?   ? ?Emotional Assessment ?  ?  ?  ?Orientation: : Oriented to Self, Oriented to Place, Oriented to  Time ?Alcohol / Substance Use: Illicit Drugs ?Psych Involvement: No (comment) ? ?Admission diagnosis:  MRSA bacteremia [R78.81, B95.62] ?Patient Active Problem List  ? Diagnosis Date Noted  ? Acute osteomyelitis of clavicle, right (HCC)   ? Gastric perforation (HCC) 08/30/2021  ? Abscess in epidural space of lumbar spine 08/27/2021  ? Abscess in epidural space of cervical spine 08/27/2021  ? Pleural effusion 08/27/2021  ? 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 MRSA bacteremia 08/26/2021  ? Pathologic fracture of right clavicle 08/26/2021  ? Anxiety with depression 08/26/2021  ?  Polysubstance abuse (HCC) 08/26/2021  ? Suicide attempt by drug overdose (HCC) 08/26/2021  ? Transaminitis 08/26/2021  ? Normocytic anemia 08/26/2021  ? ?PCP:  Pcp, No ?Pharmacy:   ?[facility] [phone] - [place], [place] - [address] AT [address] ?[address] ?[place] [address] ?Phone: [phone] Fax: [phone] ? ? ? ? ?Social Determinants of Health (SDOH) Interventions ?  ? ?Readmission Risk Interventions ?   ? View : No data to display.  ?  ?  ?  ? ? ? ?

## 2021-09-03 NOTE — Progress Notes (Signed)
? ?[facility] ?PROGRESS NOTE ? ? ?Ulla PotashDiana G Sorlie ZOX:[id number] DOB: 06/18/1985 DOA: 08/26/2021  8 ?DOS: the patient was seen and examined on 09/03/2021 ? ?PCP: Pcp, No ? ?Brief History and Hospital Course:  ?36 year old with past medical history significant for anxiety, depression, suicidal attempt drug overdose, polysubstance abuse, right clavicle fracture, was admitted at [facility] on 08/15/2021 due to right shoulder abscess.She was treated with I&D in the emergency department followed by IV antibiotics therapy as an inpatient.  She was diagnosed with MRSA bacteremia, endocarditis was ruled out with a TEE performed 3/16, but she continued to have positive blood cultures despite treatment with ceftriaxone and vancomycin.  Antibiotic therapy was changed to daptomycin.  She was then transferred to [facility]. ?Extensive imagings have been done here which showed right clavicle osteomyelitis/phlegmon/abscess, widespread cervical, thoracic, lumbar discitis/osteomyelitis/epidural abscess/cord impingement.  MRI also showed bilateral iliopsoas abscess, right complex pleural effusion.  ID, orthopedics, neurosurgery following. ?Underwent lumbar laminectomy/debridement of abscess, L4-L5 on 3/25. Hospital course also remarkable for finding of right-sided empyema, gastric perforation. Status post emergent laparotomy and perforation of repair by general surgery.  PCCM was also following for chest tube on the right side, which was removed on 3/29. ?Underwent aspiration of the psoas abscesses on 3/30. ? ?Consultants: Infectious disease, neurosurgery, orthopedics, interventional radiology.  General surgery ? ?Procedures:   ? ?3/30: Aspiration of bilateral psoas abscesses by interventional radiology ? ?3/27: Diagnostic laparoscopy, exploratory laparotomy, Cheree DittoGraham patch repair of gastric perforation ? ?3/25: L4-L5 laminectomy, debridement of epidural abscess ? ? ?Subjective: ?Abdominal pain is stable.  No new issues  mentioned by the patient.  Denies any nausea vomiting.  Asking for her diet to be advanced.   ? ? ?Assessment/Plan: ? ? 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  ?-She had TEE outside facility on 3/16, negative for endocarditis as per the report  ?-TTE did not show any vegetation, might need TEE here but was delayed due emergent surgery for gastric perforation and other surgical procedures as outlined. ?ID is following.  Patient noted to be on vancomycin currently. ?WBC continues to climb.  She is afebrile however. Last blood cultures from 3/24 negative. ? ?Gastric perforation (HCC) ?Complained of abdominal pain on 3/26.  Chest x-ray done after chest tube placement showed incidental finding of pneumoperitoneum.  CT abdomen/pelvis confirmed pneumoperitoneum.  General surgery consulted and she underwent emergent exploratory laparatomy with finding of gastric perforation.  ?General surgery continues to follow.  She is currently on a liquid diet. ? ?Abscess in epidural space of cervical spine ?Discitis/osteomyelitis at C2-3, C5-6, and C6-7. Ventral epidural abscess at C2-3, C5, and C6 with cord impingement especially at the lower 2 levels. Facet arthritis on the left at C7-T1 and right at C3-4. ?-Also showed discitis/osteomyelitis of T9- T10, T11-T12 ?-Neurosurgery following periodically.  No neurological weakness noted ? 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 Bilateral facet arthritis at L5-S1. Dorsal epidural abscess at L4 and L5,highly compressive on the thecal sac. ?S/P  lumbar laminectomy, debridement of abscess, L4-L5 on 3/25. ?No further interventions planned by neurosurgery.  PT and OT. ? ?Iliopsoas abscess (HCC) ?Pelvic MRI showed bilateral infectious sacroiliitis and osteomyelitis of the sacrum and periarticular right iliac bone. Bilateral psoas abscesses, Small bilateral paraspinal muscle  abscesses, intramuscular abscess within the left piriformis muscle, small intramuscular abscess within the left gluteus medius muscle. ?Seen by interventional radiology and underwent aspiration on 3/30. ? ? ?Pleural effusion ?  Chest CT showed a large loculated right pleural effusion concerning for empyema, subcentimeter groundglass nodularity within the bilateral lungs concerning for septic emboli.  ?S/P chest tube placement.  Fluid noted to be exudative.  No need for DNase/tPA.   ?Chest x-ray showed resolution of the effusion.  Chest tube was discontinued on 3/29.  Respiratory status is stable. ? ?Normocytic anemia ?Hemoglobin low but stable.   ?Received 2 units PRBC at outside facility.  ? ?Anxiety with depression ?Noted to be on Prozac and Ativan as needed. ? ?Acute osteomyelitis of right clavicle (HCC) ?MRI of the shoulder showed osteomyelitis, phlegmon, possible abscess.  Orthopedics following and planning for shoulder washout on 3/31. ? ? ?Polysubstance abuse (HCC) ?She declined IV drug abuse recently, last drug use was 2 years ago as per patient. ? ?Transaminitis ?HCV antibody positive ?HCV quantitative was not detected. ? ?LFTs noted to be normal on 3/30. ? ? ? ? ? ? ?DVT Prophylaxis: Lovenox ?Code Status: Full code ?Family Communication: Discussed with patient ?Disposition Plan: To be determined ? ?Status is: Inpatient ?Remains inpatient appropriate because: IV antibiotics.  Need for surgical interventions ? ? ? ? ?Medications: Scheduled: ? Chlorhexidine Gluconate Cloth  6 each Topical Q0600  ? enoxaparin (LOVENOX) injection  40 mg Subcutaneous Q24H  ? fentaNYL  1 patch Transdermal Q72H  ? FLUoxetine  40 mg Oral Daily  ? lidocaine  1 patch Transdermal Q24H  ? mouth rinse  15 mL Mouth Rinse BID  ? nicotine  21 mg Transdermal Daily  ? pantoprazole  40 mg Intravenous Q12H  ? sodium chloride flush  10 mL Other Q8H  ? ?Continuous: ? lactated ringers 75 mL/hr at 09/03/21 0936  ? methocarbamol (ROBAXIN) IV 1,000 mg  (09/03/21 5638)  ? vancomycin 1,750 mg (09/02/21 2124)  ? ?LHT:DSKAJGOTLXBWI (DILAUDID) injection, LORazepam, [DISCONTINUED] ondansetron **OR** ondansetron (ZOFRAN) IV, phenol ? ?Antibiotics: ?Anti-infectives (From admission, onward)  ? ? Start     Dose/Rate Route Frequency Ordered Stop  ? 09/01/21 2200  vancomycin (VANCOREADY) IVPB 1750 mg/350 mL       ? 1,750 mg ?175 mL/hr over 120 Minutes Intravenous Every 12 hours 09/01/21 1058    ? 08/30/21 1000  metroNIDAZOLE (FLAGYL) IVPB 500 mg  Status:  Discontinued       ? 500 mg ?100 mL/hr over 60 Minutes Intravenous Every 12 hours 08/30/21 0036 08/30/21 1708  ? 08/30/21 0600  metroNIDAZOLE (FLAGYL) IVPB 500 mg  Status:  Discontinued       ? 500 mg ?100 mL/hr over 60 Minutes Intravenous On call to O.R. 08/30/21 0220 08/30/21 0229  ? 08/30/21 0245  ciprofloxacin (CIPRO) IVPB 400 mg  Status:  Discontinued       ? 400 mg ?200 mL/hr over 60 Minutes Intravenous Every 12 hours 08/30/21 0037 08/30/21 1708  ? 08/30/21 0045  cefTRIAXone (ROCEPHIN) 2 g in sodium chloride 0.9 % 100 mL IVPB  Status:  Discontinued       ? 2 g ?200 mL/hr over 30 Minutes Intravenous Every 24 hours 08/30/21 0036 08/30/21 0036  ? 08/29/21 2315  metroNIDAZOLE (FLAGYL) IVPB 500 mg       ?Note to Pharmacy: TO MAIN OR STAT!! In surgery now  ? 500 mg ?100 mL/hr over 60 Minutes Intravenous To Surgery 08/29/21 2302 08/29/21 2319  ? 08/29/21 2200  vancomycin (VANCOREADY) IVPB 1250 mg/250 mL  Status:  Discontinued       ? 1,250 mg ?166.7 mL/hr over 90 Minutes Intravenous Every 12 hours 08/29/21 1857  09/01/21 1058  ? 08/29/21 2100  Vancomycin (VANCOCIN) 1,250 mg in sodium chloride 0.9 % 250 mL IVPB  Status:  Discontinued       ? 1,250 mg ?166.7 mL/hr over 90 Minutes Intravenous Every 12 hours 08/29/21 0837 08/29/21 1858  ? 08/29/21 0930  vancomycin (VANCOCIN) 2,000 mg in sodium chloride 0.9 % 500 mL IVPB       ? 2,000 mg ?260 mL/hr over 120 Minutes Intravenous  Once 08/29/21 0837 08/29/21 1257  ? 08/26/21 2000   cefTRIAXone (ROCEPHIN) 2 g in sodium chloride 0.9 % 100 mL IVPB  Status:  Discontinued       ? 2 g ?200 mL/hr over 30 Minutes Intravenous Every 24 hours 08/26/21 1614 08/28/21 1736  ? 08/26/21 2000  DAPTOmycin

## 2021-09-03 NOTE — Transfer of Care (Signed)
Immediate Anesthesia Transfer of Care Note ? ?Patient: Kimberly Booker ? ?Procedure(s) Performed: DEBRIDEMENT RIGHT CLAVICLE (Right: Shoulder) ?APPLICATION OF WOUND VAC (Right: Shoulder) ? ?Patient Location: PACU ? ?Anesthesia Type:General ? ?Level of Consciousness: awake, alert  and oriented ? ?Airway & Oxygen Therapy: Patient Spontanous Breathing ? ?Post-op Assessment: Report given to RN and Post -op Vital signs reviewed and stable ? ?Post vital signs: Reviewed and stable ? ?Last Vitals:  ?Vitals Value Taken Time  ?BP 108/71 09/03/21 1416  ?Temp 36.6 ?C 09/03/21 1416  ?Pulse 87 09/03/21 1418  ?Resp 11 09/03/21 1418  ?SpO2 98 % 09/03/21 1418  ?Vitals shown include unvalidated device data. ? ?Last Pain:  ?Vitals:  ? 09/03/21 1212  ?TempSrc:   ?PainSc: 2   ?   ? ?Patients Stated Pain Goal: 3 (09/03/21 1142) ? ?Complications: No notable events documented. ?

## 2021-09-03 NOTE — Anesthesia Postprocedure Evaluation (Signed)
Anesthesia Post Note ? ?Patient: Kimberly Booker ? ?Procedure(s) Performed: DEBRIDEMENT RIGHT CLAVICLE (Right: Shoulder) ?APPLICATION OF WOUND VAC (Right: Shoulder) ? ?  ? ?Patient location during evaluation: PACU ?Anesthesia Type: General ?Level of consciousness: awake and alert ?Pain management: pain level controlled ?Vital Signs Assessment: post-procedure vital signs reviewed and stable ?Respiratory status: spontaneous breathing, nonlabored ventilation and respiratory function stable ?Cardiovascular status: blood pressure returned to baseline ?Postop Assessment: no apparent nausea or vomiting ?Anesthetic complications: no ? ? ?No notable events documented. ? ?Last Vitals:  ?Vitals:  ? 09/03/21 1516 09/03/21 1545  ?BP: 110/77 106/65  ?Pulse: 78 95  ?Resp: 13 15  ?Temp:  36.4 ?C  ?SpO2: 99% 95%  ?  ?  ?  ?  ?  ?  ?  ? ?Shanda Howells ? ? ? ? ?

## 2021-09-03 NOTE — Progress Notes (Signed)
Physical Therapy Treatment ?Patient Details ?Name: Kimberly Booker ?MRN: [id number] ?DOB: March 27, 1986 ?Today's Date: 09/03/2021 ? ? ?History of Present Illness 36 year old with past medical history significant for anxiety, depression, suicidal attempt drug overdose, polysubstance abuse and right clavicle fracture 8 months ago.  She reported having occasional pain but a week ago and  was admitted at [facility] on 08/15/2021 due to right shoulder abscess and underwent I&D in the ED started IV antibiotics. Found to have MRSA bacteremia and TEE ruled out endocarditis.  Transferred to [facility] and found to have right clavicle osteomyelitis/phlegmon/abscess, widespread cervical,thoracic,lumbar discitis/osteomyelitis/epidural abscess/cord impingement and bilateral iliopsoas abscess, right complex pleural effusion.  Underwent lumbar laminectomy / debridement of abscess, L4-L5 on 3/25.  Developed right-sided empyema and gastric perforation.  Status post emergent laparotomy and perforation of repair by general surgery on 3/27.  PCCM discontinued chest tube 3/29.  R clavicle removal of osteomyelitis on bone and purulence, wound vac applied 3/31. ? ?  ?PT Comments  ? ? Pt was seen for mobility after surgery yesterday, now able to be assisted to scoot on side of bed but not fully stand.  Pt was interested in being up but did express some concerns about her abd wound and the pending clavicle procedure.  Follow along with her to continue to mobilize as she can tolerate, encouraging her to get up and walk when able.  Follow up with MD regarding limits of surgery today, and may have wb precautions on R shoulder.  Focus on acute PT goals as written.   ?Recommendations for follow up therapy are one component of a multi-disciplinary discharge planning process, led by the attending physician.  Recommendations may be updated based on patient status, additional functional criteria and insurance authorization. ? ?Follow Up  Recommendations ? Acute inpatient rehab (3hours/day) ?  ?  ?Assistance Recommended at Discharge Frequent or constant Supervision/Assistance  ?Patient can return home with the following Two people to help with walking and/or transfers;A lot of help with bathing/dressing/bathroom;Assistance with cooking/housework;Direct supervision/assist for medications management;Assist for transportation;Help with stairs or ramp for entrance;Direct supervision/assist for financial management ?  ?Equipment Recommendations ? Rolling walker (2 wheels)  ?  ?Recommendations for Other Services   ? ? ?  ?Precautions / Restrictions Precautions ?Precautions: Fall;Back ?Precaution Comments: JP drain and wound vac R clavicle ?Restrictions ?Weight Bearing Restrictions: No ?Other Position/Activity Restrictions: awaiting WB information for R shoulder  ?  ? ?Mobility ? Bed Mobility ?Overal bed mobility: Needs Assistance ?Bed Mobility: Rolling, Sidelying to Sit, Sit to Sidelying ?Rolling: Min assist ?Sidelying to sit: Mod assist ?  ?  ?Sit to sidelying: Mod assist ?General bed mobility comments: monitored back and abd precautions ?  ? ?Transfers ?Overall transfer level: Needs assistance ?Equipment used: 1 person hand held assist, Rolling walker (2 wheels) ?Transfers: Sit to/from Stand ?  ?  ?  ?  ?  ?  ?General transfer comment: attempted to stand but pt will only allow PT to give minor assistance ?  ? ?Ambulation/Gait ?  ?  ?  ?  ?  ?  ?  ?  ? ? ?Stairs ?  ?  ?  ?  ?  ? ? ?Wheelchair Mobility ?  ? ?Modified Rankin (Stroke Patients Only) ?  ? ? ?  ?Balance Overall balance assessment: Needs assistance ?Sitting-balance support: Feet supported ?Sitting balance-Leahy Scale: Fair ?  ?  ?  ?  ?  ?  ?  ?  ?  ?  ?  ?  ?  ?  ?  ?  ?  ? ?  ?  Cognition Arousal/Alertness: Awake/alert ?Behavior During Therapy: Anxious ?Overall Cognitive Status: Within Functional Limits for tasks assessed ?  ?  ?  ?  ?  ?  ?  ?  ?  ?  ?  ?  ?  ?  ?  ?  ?General Comments:  discussion of her fears and concerns ?  ?  ? ?  ?Exercises   ? ?  ?General Comments General comments (skin integrity, edema, etc.): instructed pt on safety and body mechanics with good carryover by pt ?  ?  ? ?Pertinent Vitals/Pain Pain Assessment ?Pain Assessment: Faces ?Faces Pain Scale: Hurts even more ?Breathing: normal ?Negative Vocalization: none ?Pain Location: back, abdomen, hips ?Pain Descriptors / Indicators: Aching, Guarding, Grimacing ?Pain Intervention(s): Limited activity within patient's tolerance, Monitored during session, Premedicated before session, Repositioned  ? ? ?Home Living   ?  ?  ?  ?  ?  ?  ?  ?  ?  ?   ?  ?Prior Function    ?  ?  ?   ? ?PT Goals (current goals can now be found in the care plan section) Acute Rehab PT Goals ?Patient Stated Goal: to go to rehab ? ?  ?Frequency ? ? ? Min 3X/week ? ? ? ?  ?PT Plan Current plan remains appropriate  ? ? ?Co-evaluation   ?  ?  ?  ?  ? ?  ?AM-PAC PT "6 Clicks" Mobility   ?Outcome Measure ? Help needed turning from your back to your side while in a flat bed without using bedrails?: A Lot ?Help needed moving from lying on your back to sitting on the side of a flat bed without using bedrails?: A Lot ?Help needed moving to and from a bed to a chair (including a wheelchair)?: Total ?Help needed standing up from a chair using your arms (e.g., wheelchair or bedside chair)?: Total ?Help needed to walk in hospital room?: Total ?Help needed climbing 3-5 steps with a railing? : Total ?6 Click Score: 8 ? ?  ?End of Session Equipment Utilized During Treatment: Gait belt ?Activity Tolerance: Patient limited by pain ?Patient left: in bed;with call bell/phone within reach;with bed alarm set (in bed due to impending clavicle procedure) ?Nurse Communication: Mobility status ?PT Visit Diagnosis: Other abnormalities of gait and mobility (R26.89);Muscle weakness (generalized) (M62.81);Pain ?Pain - Right/Left: Right ?Pain - part of body: Shoulder (abd, back) ?   ? ? ?Time: 3474-2595 ?PT Time Calculation (min) (ACUTE ONLY): 24 min ? ?Charges:  $Therapeutic Activity: 23-37 mins   ?Ivar Drape ?09/03/2021, 3:33 PM ? ?Samul Dada, PT PhD ?Acute Rehab Dept. Number: [facility] [phone] and [facility] [phone] ? ? ?

## 2021-09-04 DIAGNOSIS — B9562 Methicillin resistant Staphylococcus aureus infection as the cause of diseases classified elsewhere: Secondary | ICD-10-CM | POA: Diagnosis not present

## 2021-09-04 DIAGNOSIS — R7881 Bacteremia: Secondary | ICD-10-CM | POA: Diagnosis not present

## 2021-09-04 LAB — CBC
HCT: 29.8 % — ABNORMAL LOW (ref 36.0–46.0)
Hemoglobin: 9.3 g/dL — ABNORMAL LOW (ref 12.0–15.0)
MCH: 30 pg (ref 26.0–34.0)
MCHC: 31.2 g/dL (ref 30.0–36.0)
MCV: 96.1 fL (ref 80.0–100.0)
Platelets: 628 10*3/uL — ABNORMAL HIGH (ref 150–400)
RBC: 3.1 MIL/uL — ABNORMAL LOW (ref 3.87–5.11)
RDW: 14.6 % (ref 11.5–15.5)
WBC: 14.3 10*3/uL — ABNORMAL HIGH (ref 4.0–10.5)
nRBC: 0 % (ref 0.0–0.2)

## 2021-09-04 LAB — BASIC METABOLIC PANEL
Anion gap: 6 (ref 5–15)
BUN: 10 mg/dL (ref 6–20)
CO2: 22 mmol/L (ref 22–32)
Calcium: 8.2 mg/dL — ABNORMAL LOW (ref 8.9–10.3)
Chloride: 103 mmol/L (ref 98–111)
Creatinine, Ser: 0.34 mg/dL — ABNORMAL LOW (ref 0.44–1.00)
GFR, Estimated: >60 mL/min (ref >60)
Glucose, Bld: 139 mg/dL — ABNORMAL HIGH (ref 70–99)
Potassium: 4 mmol/L (ref 3.5–5.1)
Sodium: 131 mmol/L — ABNORMAL LOW (ref 135–145)

## 2021-09-04 MED ORDER — LIP MEDEX EX OINT
1.0000 | TOPICAL_OINTMENT | CUTANEOUS | Status: DC | PRN
Start: 2021-09-04 — End: 2021-10-29
  Filled 2021-09-04: qty 7

## 2021-09-04 NOTE — Progress Notes (Signed)
Patient ID: Kimberly Booker, female   DOB: [birth year], 36 y.o.   MRN: [id number] ?Patient is postoperative day 1 partial excision of the right clavicle placement of vancomycin powder, and Kerecis tissue graft with a cleanse choice wound VAC sponge.  There is 50 cc in the wound VAC canister bone and soft tissue sent for cultures.  We will continue the wound VAC for approximately 1 week.  Anticipate patient will discharge on a portable Praveena plus wound VAC pump. ?

## 2021-09-04 NOTE — Progress Notes (Signed)
? ?[facility] ?PROGRESS NOTE ? ? ?JULIE-ANNE TORAIN ZOX:[id number] DOB: [birth year] DOA: 08/26/2021  9 ?DOS: the patient was seen and examined on 09/04/2021 ? ?PCP: Pcp, No ? ?Brief History and Hospital Course:  ?36 year old with past medical history significant for anxiety, depression, suicidal attempt drug overdose, polysubstance abuse, right clavicle fracture, was admitted at [facility] on 08/15/2021 due to right shoulder abscess.She was treated with I&D in the emergency department followed by IV antibiotics therapy as an inpatient.  She was diagnosed with MRSA bacteremia, endocarditis was ruled out with a TEE performed 3/16, but she continued to have positive blood cultures despite treatment with ceftriaxone and vancomycin.  Antibiotic therapy was changed to daptomycin.  She was then transferred to [facility]. ?Extensive imagings have been done here which showed right clavicle osteomyelitis/phlegmon/abscess, widespread cervical, thoracic, lumbar discitis/osteomyelitis/epidural abscess/cord impingement.  MRI also showed bilateral iliopsoas abscess, right complex pleural effusion.  ID, orthopedics, neurosurgery following. ?Underwent lumbar laminectomy/debridement of abscess, L4-L5 on 3/25. Hospital course also remarkable for finding of right-sided empyema, gastric perforation. Status post emergent laparotomy and perforation of repair by general surgery.  PCCM was also following for chest tube on the right side, which was removed on 3/29. ?Underwent aspiration of the psoas abscesses on 3/30.  ?Underwent debridement of the right clavicle and application of wound VAC to the right shoulder area on 3/31. ? ?Consultants: Infectious disease, neurosurgery, orthopedics, interventional radiology.  General surgery ? ?Procedures:   ? ?3/30: Aspiration of bilateral psoas abscesses by interventional radiology ? ?3/27: Diagnostic laparoscopy, exploratory laparotomy, Cheree Ditto patch repair of gastric perforation ? ?3/25:  L4-L5 laminectomy, debridement of epidural abscess ? ? ?Subjective: ?Patient feels well.  Experienced shoulder pain after surgery but better controlled this morning.  No nausea vomiting.  Tolerating her liquid diet. ? ? ?Assessment/Plan: ? ? 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  ?-She had TEE outside facility on 3/16, negative for endocarditis as per the report  ?-TTE did not show any vegetation, might need TEE here but was delayed due emergent surgery for gastric perforation and other surgical procedures as outlined.  Will defer to ID. ?ID is following.   ?Patient is on vancomycin.  WBC noted to be better today compared to yesterday.   ?Last blood cultures from 3/24 negative. ? ?Gastric perforation (HCC) ?Complained of abdominal pain on 3/26.  Chest x-ray done after chest tube placement showed incidental finding of pneumoperitoneum.  CT abdomen/pelvis confirmed pneumoperitoneum.  General surgery consulted and she underwent emergent exploratory laparatomy with finding of gastric perforation.  ?General surgery continues to follow.  She is currently on a liquid diet. ? ?Abscess in epidural space of cervical spine ?Discitis/osteomyelitis at C2-3, C5-6, and C6-7. Ventral epidural abscess at C2-3, C5, and C6 with cord impingement especially at the lower 2 levels. Facet arthritis on the left at C7-T1 and right at C3-4. ?-Also showed discitis/osteomyelitis of T9- T10, T11-T12 ?-Neurosurgery following periodically.  No neurological weakness noted ? 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 Bilateral facet arthritis at L5-S1. Dorsal epidural abscess at L4 and L5,highly compressive on the thecal sac. ?S/P  lumbar laminectomy, debridement of abscess, L4-L5 on 3/25. ?No further interventions planned by neurosurgery.  PT and OT. ? ?Iliopsoas abscess (HCC) ?Pelvic MRI showed bilateral infectious sacroiliitis and  osteomyelitis of the sacrum and periarticular right iliac bone. Bilateral psoas abscesses, Small bilateral paraspinal muscle abscesses, intramuscular abscess within the left piriformis muscle, small  intramuscular abscess within the left gluteus medius muscle. ?Seen by interventional radiology and underwent aspiration on 3/30.  Moderate Staph aureus noted on cultures.  Waiting on final identification and sensitivities. ? ? ?Pleural effusion ?Chest CT showed a large loculated right pleural effusion concerning for empyema, subcentimeter groundglass nodularity within the bilateral lungs concerning for septic emboli.  ?S/P chest tube placement.  Fluid noted to be exudative.  No need for DNase/tPA.   ?Chest x-ray showed resolution of the effusion.  Chest tube was discontinued on 3/29.  Respiratory status is stable. ? ?Normocytic anemia ?Hemoglobin low but stable.   ?Received 2 units PRBC at outside facility.  ? ?Anxiety with depression ?Noted to be on Prozac and Ativan as needed. ? ?Acute osteomyelitis of clavicle, right (HCC) ?MRI of the shoulder showed osteomyelitis, phlegmon, possible abscess.  Orthopedics following.  Underwent debridement of right clavicle and application of wound VAC on 3/31.  Cultures pending. ? ? ?Polysubstance abuse (HCC) ?She declined IV drug abuse recently, last drug use was 2 years ago as per patient. ? ?Transaminitis ?HCV antibody positive ?HCV quantitative was not detected. ? ?LFTs noted to be normal on 3/30. ? ? ? ? ? ? ?DVT Prophylaxis: Lovenox ?Code Status: Full code ?Family Communication: Discussed with patient ?Disposition Plan: Not a candidate for home IV antibiotic therapy due to history of polysubstance abuse. ? ?Status is: Inpatient ?Remains inpatient appropriate because: IV antibiotics.  Need for surgical interventions ? ? ? ? ?Medications: Scheduled: ? docusate sodium  100 mg Oral BID  ? enoxaparin (LOVENOX) injection  40 mg Subcutaneous Q24H  ? fentaNYL  1 patch Transdermal Q72H   ? FLUoxetine  40 mg Oral Daily  ? lidocaine  1 patch Transdermal Q24H  ? mouth rinse  15 mL Mouth Rinse BID  ? nicotine  21 mg Transdermal Daily  ? pantoprazole  40 mg Intravenous Q12H  ? sodium chloride flush  10 mL Other Q8H  ? ?Continuous: ? sodium chloride 10 mL/hr at 09/03/21 1701  ? lactated ringers 75 mL/hr at 09/03/21 1310  ? methocarbamol (ROBAXIN) IV 1,000 mg (09/04/21 0546)  ? methocarbamol (ROBAXIN) IV    ? vancomycin 1,750 mg (09/04/21 0912)  ? ?WUJ:[id number]:bisacodyl, HYDROmorphone (DILAUDID) injection, lip balm, LORazepam, methocarbamol **OR** methocarbamol (ROBAXIN) IV, metoCLOPramide **OR** metoCLOPramide (REGLAN) injection, ondansetron **OR** ondansetron (ZOFRAN) IV, phenol, polyethylene glycol ? ?Antibiotics: ?Anti-infectives (From admission, onward)  ? ? Start     Dose/Rate Route Frequency Ordered Stop  ? 09/03/21 1355  vancomycin (VANCOCIN) powder  Status:  Discontinued       ?   As needed 09/03/21 1355 09/03/21 1426  ? 09/01/21 2200  vancomycin (VANCOREADY) IVPB 1750 mg/350 mL       ? 1,750 mg ?175 mL/hr over 120 Minutes Intravenous Every 12 hours 09/01/21 1058    ? 08/30/21 1000  metroNIDAZOLE (FLAGYL) IVPB 500 mg  Status:  Discontinued       ? 500 mg ?100 mL/hr over 60 Minutes Intravenous Every 12 hours 08/30/21 0036 08/30/21 1708  ? 08/30/21 0600  metroNIDAZOLE (FLAGYL) IVPB 500 mg  Status:  Discontinued       ? 500 mg ?100 mL/hr over 60 Minutes Intravenous On call to O.R. 08/30/21 0220 08/30/21 0229  ? 08/30/21 0245  ciprofloxacin (CIPRO) IVPB 400 mg  Status:  Discontinued       ? 400 mg ?200 mL/hr over 60 Minutes Intravenous Every 12 hours 08/30/21 0037 08/30/21 1708  ? 08/30/21 0045  cefTRIAXone (ROCEPHIN) 2 g  in sodium chloride 0.9 % 100 mL IVPB  Status:  Discontinued       ? 2 g ?200 mL/hr over 30 Minutes Intravenous Every 24 hours 08/30/21 0036 08/30/21 0036  ? 08/29/21 2315  metroNIDAZOLE (FLAGYL) IVPB 500 mg       ?Note to Pharmacy: TO MAIN OR STAT!! In surgery now  ? 500 mg ?100 mL/hr over  60 Minutes Intravenous To Surgery 08/29/21 2302 08/29/21 2319  ? 08/29/21 2200  vancomycin (VANCOREADY) IVPB 1250 mg/250 mL  Status:  Discontinued       ? 1,250 mg ?166.7 mL/hr over 90 Minutes Intravenous

## 2021-09-04 NOTE — Progress Notes (Signed)
Patient ID: Kimberly Booker, female   DOB: Feb 04, 1986, 36 y.o.   MRN: [id number] ?1 Day Post-Op  ?  ?Subjective: ?No new complaints.  Tolerating clear liquids well.  No nausea ? ?ROS negative except as listed above. ?Objective: ?Vital signs in last 24 hours: ?Temp:  [97.6 ?F (36.4 ?C)-98.4 ?F (36.9 ?C)] 98.2 ?F (36.8 ?C) (04/01 0804) ?Pulse Rate:  [54-97] 97 (04/01 0804) ?Resp:  [11-17] 17 (04/01 0804) ?BP: (106-124)/(53-80) 113/80 (04/01 0804) ?SpO2:  [95 %-99 %] 99 % (04/01 0804) ?Last BM Date : 08/25/21 ? ?Intake/Output from previous day: ?03/31 0701 - 04/01 0700 ?In: [id number] [P.O.:240; I.V.:800] ?Out: [id number] [Urine:500; Drains:350; Blood:25] ?Intake/Output this shift: ?Total I/O ?In: -  ?Out: 600 [Urine:600] ? ?General appearance: alert and cooperative ?Resp: clear to auscultation bilaterally ?GI: incision OK with loose staples ?JP 120SS ?Lab Results: ?CBC  ?Recent Labs  ?  09/03/21 ?[id number] 09/04/21 ?0252  ?WBC 19.2* 14.3*  ?HGB 10.4* 9.3*  ?HCT 31.2* 29.8*  ?PLT 608* 628*  ? ?BMET ?Recent Labs  ?  09/02/21 ?0150 09/04/21 ?0252  ?NA 132* 131*  ?K 3.7 4.0  ?CL 99 103  ?CO2 23 22  ?GLUCOSE 82 139*  ?BUN 6 10  ?CREATININE 0.31* 0.34*  ?CALCIUM 8.2* 8.2*  ? ?PT/INR ?No results for input(s): LABPROT, INR in the last 72 hours. ?ABG ?No results for input(s): PHART, HCO3 in the last 72 hours. ? ?Invalid input(s): PCO2, PO2 ? ?Studies/Results: ?CT ASPIRATION ? ?Result Date: 09/02/2021 ?INDICATION: 36 year old woman with bilateral iliopsoas abscesses presents to IR for CT-guided aspiration. EXAM: CT-guided bilateral iliopsoas abscess aspiration MEDICATIONS: The patient is currently admitted to the hospital and receiving intravenous antibiotics. The antibiotics were administered within an appropriate time frame prior to the initiation of the procedure. ANESTHESIA/SEDATION: General anesthesia performed and monitored by the anesthesia team. COMPLICATIONS: None immediate. PROCEDURE: Informed written consent was obtained from the patient  after a thorough discussion of the procedural risks, benefits and alternatives. All questions were addressed. Maximal Sterile Barrier Technique was utilized including caps, mask, sterile gowns, sterile gloves, sterile drape, hand hygiene and skin antiseptic. A timeout was performed prior to the initiation of the procedure. Patient positioned supine on the procedure table. Contrast enhanced CT performed to delineate safe approach to the right iliopsoas abscess. Once a safe approach window was identified, the overlying skin was prepped and draped in usual fashion. Following local lidocaine administration, 17 gauge needle was advanced into the abscess and 8 mL of purulent material was aspirated. Samples were sent for Gram stain and culture. Patient was again scanned with contrast enhanced CT to determine safe approach to the left iliopsoas abscess. Once a safe window was identified, 17 gauge needle was advanced into the left iliopsoas abscess and 1 mL of purulent material was aspirated. The patient on the procedures well without immediate complication. IMPRESSION: 1. CT-guided aspiration of right iliopsoas abscess yielding 8 mL of purulent material. Sample sent for Gram stain and culture. 2. CT-guided aspiration of left iliopsoas abscess healed in 1 mL of purulent material. Electronically Signed   By: Sharen Heck  Mir M.D.   On: 09/02/2021 15:25  ? ?CT ASPIRATION ? ?Result Date: 09/02/2021 ?INDICATION: 36 year old woman with bilateral iliopsoas abscesses presents to IR for CT-guided aspiration. EXAM: CT-guided bilateral iliopsoas abscess aspiration MEDICATIONS: The patient is currently admitted to the hospital and receiving intravenous antibiotics. The antibiotics were administered within an appropriate time frame prior to the initiation of the procedure. ANESTHESIA/SEDATION: General anesthesia performed and monitored by  the anesthesia team. COMPLICATIONS: None immediate. PROCEDURE: Informed written consent was obtained  from the patient after a thorough discussion of the procedural risks, benefits and alternatives. All questions were addressed. Maximal Sterile Barrier Technique was utilized including caps, mask, sterile gowns, sterile gloves, sterile drape, hand hygiene and skin antiseptic. A timeout was performed prior to the initiation of the procedure. Patient positioned supine on the procedure table. Contrast enhanced CT performed to delineate safe approach to the right iliopsoas abscess. Once a safe approach window was identified, the overlying skin was prepped and draped in usual fashion. Following local lidocaine administration, 17 gauge needle was advanced into the abscess and 8 mL of purulent material was aspirated. Samples were sent for Gram stain and culture. Patient was again scanned with contrast enhanced CT to determine safe approach to the left iliopsoas abscess. Once a safe window was identified, 17 gauge needle was advanced into the left iliopsoas abscess and 1 mL of purulent material was aspirated. The patient on the procedures well without immediate complication. IMPRESSION: 1. CT-guided aspiration of right iliopsoas abscess yielding 8 mL of purulent material. Sample sent for Gram stain and culture. 2. CT-guided aspiration of left iliopsoas abscess healed in 1 mL of purulent material. Electronically Signed   By: Sharen Heck  Mir M.D.   On: 09/02/2021 15:25   ? ?Anti-infectives: ?Anti-infectives (From admission, onward)  ? ? Start     Dose/Rate Route Frequency Ordered Stop  ? 09/03/21 1355  vancomycin (VANCOCIN) powder  Status:  Discontinued       ?   As needed 09/03/21 1355 09/03/21 1426  ? 09/01/21 2200  vancomycin (VANCOREADY) IVPB 1750 mg/350 mL       ? 1,750 mg ?175 mL/hr over 120 Minutes Intravenous Every 12 hours 09/01/21 1058    ? 08/30/21 1000  metroNIDAZOLE (FLAGYL) IVPB 500 mg  Status:  Discontinued       ? 500 mg ?100 mL/hr over 60 Minutes Intravenous Every 12 hours 08/30/21 0036 08/30/21 1708  ? 08/30/21  0600  metroNIDAZOLE (FLAGYL) IVPB 500 mg  Status:  Discontinued       ? 500 mg ?100 mL/hr over 60 Minutes Intravenous On call to O.R. 08/30/21 0220 08/30/21 0229  ? 08/30/21 0245  ciprofloxacin (CIPRO) IVPB 400 mg  Status:  Discontinued       ? 400 mg ?200 mL/hr over 60 Minutes Intravenous Every 12 hours 08/30/21 0037 08/30/21 1708  ? 08/30/21 0045  cefTRIAXone (ROCEPHIN) 2 g in sodium chloride 0.9 % 100 mL IVPB  Status:  Discontinued       ? 2 g ?200 mL/hr over 30 Minutes Intravenous Every 24 hours 08/30/21 0036 08/30/21 0036  ? 08/29/21 2315  metroNIDAZOLE (FLAGYL) IVPB 500 mg       ?Note to Pharmacy: TO MAIN OR STAT!! In surgery now  ? 500 mg ?100 mL/hr over 60 Minutes Intravenous To Surgery 08/29/21 2302 08/29/21 2319  ? 08/29/21 2200  vancomycin (VANCOREADY) IVPB 1250 mg/250 mL  Status:  Discontinued       ? 1,250 mg ?166.7 mL/hr over 90 Minutes Intravenous Every 12 hours 08/29/21 1857 09/01/21 1058  ? 08/29/21 2100  Vancomycin (VANCOCIN) 1,250 mg in sodium chloride 0.9 % 250 mL IVPB  Status:  Discontinued       ? 1,250 mg ?166.7 mL/hr over 90 Minutes Intravenous Every 12 hours 08/29/21 0837 08/29/21 1858  ? 08/29/21 0930  vancomycin (VANCOCIN) 2,000 mg in sodium chloride 0.9 % 500 mL IVPB       ?  2,000 mg ?260 mL/hr over 120 Minutes Intravenous  Once 08/29/21 0837 08/29/21 1257  ? 08/26/21 2000  cefTRIAXone (ROCEPHIN) 2 g in sodium chloride 0.9 % 100 mL IVPB  Status:  Discontinued       ? 2 g ?200 mL/hr over 30 Minutes Intravenous Every 24 hours 08/26/21 1614 08/28/21 1736  ? 08/26/21 2000  DAPTOmycin (CUBICIN) 700 mg in sodium chloride 0.9 % IVPB  Status:  Discontinued       ? 8 mg/kg ? 87.2 kg ?128 mL/hr over 30 Minutes Intravenous Daily 08/26/21 1819 08/29/21 0842  ? ?  ? ? ?Assessment/Plan: ?POD 6, S/P Dx laparoscopy, exploratory laparotomy, graham patch repair of gastric perforation 3/26 Dr. Michaelle Birks for perforated gastric ulcer in posterior stomach ?- h.pylori negative ?-adv to full liquids today ?-  monitor drain output, but SS right now and no signs of bile ?- PPI BID  ?- IS/Pulm toilet, OOB as able, PT/OT consults ? ? ?FEN: FLD, IVFs ?ID: per ID, Flagyl, Vanc, Cipro  ?VTE: SCD's, Lovenox  ?Foley: o

## 2021-09-05 ENCOUNTER — Inpatient Hospital Stay (HOSPITAL_COMMUNITY): Payer: Medicaid Other

## 2021-09-05 LAB — CBC
HCT: 27.2 % — ABNORMAL LOW (ref 36.0–46.0)
Hemoglobin: 8.8 g/dL — ABNORMAL LOW (ref 12.0–15.0)
MCH: 29.9 pg (ref 26.0–34.0)
MCHC: 32.4 g/dL (ref 30.0–36.0)
MCV: 92.5 fL (ref 80.0–100.0)
Platelets: 573 10*3/uL — ABNORMAL HIGH (ref 150–400)
RBC: 2.94 MIL/uL — ABNORMAL LOW (ref 3.87–5.11)
RDW: 14.4 % (ref 11.5–15.5)
WBC: 16.8 10*3/uL — ABNORMAL HIGH (ref 4.0–10.5)
nRBC: 0 % (ref 0.0–0.2)

## 2021-09-05 MED ORDER — HYDROMORPHONE HCL 1 MG/ML IJ SOLN
1.0000 mg | INTRAMUSCULAR | Status: DC | PRN
  Administered 2021-09-05 – 2021-09-17 (×48): 1 mg via INTRAVENOUS
  Filled 2021-09-05 (×51): qty 1

## 2021-09-05 MED ORDER — OXYCODONE-ACETAMINOPHEN 7.5-325 MG PO TABS
1.0000 | ORAL_TABLET | ORAL | Status: DC | PRN
  Administered 2021-09-05 – 2021-10-19 (×163): 1 via ORAL
  Filled 2021-09-05 (×168): qty 1

## 2021-09-05 MED ORDER — FENTANYL 25 MCG/HR TD PT72
1.0000 | MEDICATED_PATCH | TRANSDERMAL | Status: DC
  Administered 2021-09-07 – 2021-10-19 (×15): 1 via TRANSDERMAL
  Filled 2021-09-05 (×15): qty 1

## 2021-09-05 MED ORDER — LORAZEPAM 2 MG/ML IJ SOLN
0.5000 mg | Freq: Once | INTRAMUSCULAR | Status: AC
  Administered 2021-09-05: 0.5 mg via INTRAVENOUS
  Filled 2021-09-05: qty 1

## 2021-09-05 NOTE — Progress Notes (Signed)
? ?[facility] ?PROGRESS NOTE ? ? ?Kimberly Booker CLE:[id number] DOB: 10-Feb-1986 DOA: 08/26/2021  10 ?DOS: the patient was seen and examined on 09/05/2021 ? ?PCP: Pcp, No ? ?Brief History and Hospital Course:  ?36 year old with past medical history significant for anxiety, depression, suicidal attempt drug overdose, polysubstance abuse, right clavicle fracture, was admitted at [facility] on 08/15/2021 due to right shoulder abscess.She was treated with I&D in the emergency department followed by IV antibiotics therapy as an inpatient.  She was diagnosed with MRSA bacteremia, endocarditis was ruled out with a TEE performed 3/16, but she continued to have positive blood cultures despite treatment with ceftriaxone and vancomycin.  Antibiotic therapy was changed to daptomycin.  She was then transferred to [facility]. ?Extensive imagings have been done here which showed right clavicle osteomyelitis/phlegmon/abscess, widespread cervical, thoracic, lumbar discitis/osteomyelitis/epidural abscess/cord impingement.  MRI also showed bilateral iliopsoas abscess, right complex pleural effusion.  ID, orthopedics, neurosurgery following. ?Underwent lumbar laminectomy/debridement of abscess, L4-L5 on 3/25. Hospital course also remarkable for finding of right-sided empyema, gastric perforation. Status post emergent laparotomy and perforation of repair by general surgery.  PCCM was also following for chest tube on the right side, which was removed on 3/29. ?Underwent aspiration of the psoas abscesses on 3/30.  ?Underwent debridement of the right clavicle and application of wound VAC to the right shoulder area on 3/31. ? ?Consultants: Infectious disease, neurosurgery, orthopedics, interventional radiology.  General surgery ? ?Procedures:   ? ?3/31: DEBRIDEMENT RIGHT CLAVICLE, APPLICATION OF WOUND VAC ? ?3/30: Aspiration of bilateral psoas abscesses by interventional radiology ? ?3/27: Diagnostic laparoscopy, exploratory  laparotomy, Cheree Ditto patch repair of gastric perforation ? ?3/25: L4-L5 laminectomy, debridement of epidural abscess ? ? ?Subjective: ?Patient mentions that she is experiencing worsening pain in her lower back radiating down the legs.  This started around few hours ago in the middle of the night.  She is able to move her legs.  Has been able to pass urine.  Pain is 9 out of 10 in intensity. ? ? ? ?Assessment/Plan: ? ? 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  ?-She had TEE outside facility on 3/16, negative for endocarditis as per the report  ?-TTE did not show any vegetation, might need TEE here but was delayed due emergent surgery for gastric perforation and other surgical procedures as outlined.  Will defer to ID. ?ID is following.   ?Patient is on vancomycin.  WBC is fluctuating.  She is afebrile. ?Last blood cultures from 3/24 negative. ? ?Gastric perforation (HCC) ?Complained of abdominal pain on 3/26.  Chest x-ray done after chest tube placement showed incidental finding of pneumoperitoneum.  CT abdomen/pelvis confirmed pneumoperitoneum.  General surgery consulted and she underwent emergent exploratory laparatomy with finding of gastric perforation.  ?General surgery continues to follow.  Advanced to full liquid diet yesterday. ? ?Abscess in epidural space of cervical spine ?Discitis/osteomyelitis at C2-3, C5-6, and C6-7. Ventral epidural abscess at C2-3, C5, and C6 with cord impingement especially at the lower 2 levels. Facet arthritis on the left at C7-T1 and right at C3-4. ?-Also showed discitis/osteomyelitis of T9- T10, T11-T12 ?-Neurosurgery following periodically.  No neurological weakness noted ? 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 Bilateral facet arthritis at L5-S1. Dorsal epidural abscess at L4 and L5,highly compressive on the thecal sac. ?S/P  lumbar laminectomy, debridement of abscess,  L4-L5 on 3/25. ?No further interventions planned by neurosurgery.   ?Patient complains of  worsening low back pain overnight.  She is able to move her legs.  We will proceed with the MRI of the lumbar spine to see if there is any new process.  Her last surgical intervention was on 3/25. ? ?Iliopsoas abscess (HCC) ?Pelvic MRI showed bilateral infectious sacroiliitis and osteomyelitis of the sacrum and periarticular right iliac bone. Bilateral psoas abscesses, Small bilateral paraspinal muscle abscesses, intramuscular abscess within the left piriformis muscle, small intramuscular abscess within the left gluteus medius muscle. ?Seen by interventional radiology and underwent aspiration on 3/30.  Moderate Staph aureus noted on cultures.  Waiting on final identification and sensitivities. ? ? ?Pleural effusion ?Chest CT showed a large loculated right pleural effusion concerning for empyema, subcentimeter groundglass nodularity within the bilateral lungs concerning for septic emboli.  ?S/P chest tube placement.  Fluid noted to be exudative.  No need for DNase/tPA.   ?Chest x-ray showed resolution of the effusion.  Chest tube was discontinued on 3/29.  Respiratory status is stable. ? ?Normocytic anemia ?Hemoglobin low but stable.   ?Received 2 units PRBC at outside facility.  ? ?Anxiety with depression ?Noted to be on Prozac and Ativan as needed. ? ?Acute osteomyelitis of clavicle, right (HCC) ?MRI of the shoulder showed osteomyelitis, phlegmon, possible abscess.  Orthopedics following.  Underwent debridement of right clavicle and application of wound VAC on 3/31.  Cultures pending. ? ? ?Polysubstance abuse (HCC) ?She declined IV drug abuse recently, last drug use was 2 years ago as per patient. ? ?Transaminitis ?HCV antibody positive ?HCV quantitative was not detected. ? ?LFTs noted to be normal on 3/30. ? ? ? ? ? ? ?DVT Prophylaxis: Lovenox ?Code Status: Full code ?Family Communication: Discussed with patient ?Disposition  Plan: Not a candidate for home IV antibiotic therapy due to history of polysubstance abuse. ? ?Status is: Inpatient ?Remains inpatient appropriate because: IV antibiotics.  Need for surgical interventions ? ? ? ? ?Medications: Scheduled: ? docusate sodium  100 mg Oral BID  ? enoxaparin (LOVENOX) injection  40 mg Subcutaneous Q24H  ? [START ON 09/07/2021] fentaNYL  1 patch Transdermal Q72H  ? FLUoxetine  40 mg Oral Daily  ? lidocaine  1 patch Transdermal Q24H  ? LORazepam  0.5 mg Intravenous Once  ? mouth rinse  15 mL Mouth Rinse BID  ? nicotine  21 mg Transdermal Daily  ? pantoprazole  40 mg Intravenous Q12H  ? sodium chloride flush  10 mL Other Q8H  ? ?Continuous: ? sodium chloride 10 mL/hr at 09/03/21 1701  ? lactated ringers 75 mL/hr at 09/03/21 1310  ? methocarbamol (ROBAXIN) IV Stopped (09/05/21 0610)  ? methocarbamol (ROBAXIN) IV    ? vancomycin Stopped (09/05/21 0218)  ? ?UJW:[id number]:bisacodyl, HYDROmorphone (DILAUDID) injection, lip balm, LORazepam, methocarbamol **OR** methocarbamol (ROBAXIN) IV, metoCLOPramide **OR** metoCLOPramide (REGLAN) injection, ondansetron **OR** ondansetron (ZOFRAN) IV, oxyCODONE-acetaminophen, phenol, polyethylene glycol ? ?Antibiotics: ?Anti-infectives (From admission, onward)  ? ? Start     Dose/Rate Route Frequency Ordered Stop  ? 09/03/21 1355  vancomycin (VANCOCIN) powder  Status:  Discontinued       ?   As needed 09/03/21 1355 09/03/21 1426  ? 09/01/21 2200  vancomycin (VANCOREADY) IVPB 1750 mg/350 mL       ? 1,750 mg ?175 mL/hr over 120 Minutes Intravenous Every 12 hours 09/01/21 1058    ? 08/30/21 1000  metroNIDAZOLE (FLAGYL) IVPB 500 mg  Status:  Discontinued       ? 500 mg ?100 mL/hr over 60 Minutes Intravenous Every 12 hours  08/30/21 0036 08/30/21 1708  ? 08/30/21 0600  metroNIDAZOLE (FLAGYL) IVPB 500 mg  Status:  Discontinued       ? 500 mg ?100 mL/hr over 60 Minutes Intravenous On call to O.R. 08/30/21 0220 08/30/21 0229  ? 08/30/21 0245  ciprofloxacin (CIPRO) IVPB 400 mg   Status:  Discontinued       ? 400 mg ?200 mL/hr over 60 Minutes Intravenous Every 12 hours 08/30/21 0037 08/30/21 1708  ? 08/30/21 0045  cefTRIAXone (ROCEPHIN) 2 g in sodium chloride 0.9 % 100 mL IVPB  S

## 2021-09-05 NOTE — Progress Notes (Signed)
Physical Therapy Treatment ?Patient Details ?Name: Kimberly Booker ?MRN: [id number] ?DOB: [birth year] ?Today's Date: 09/05/2021 ? ? ?History of Present Illness The pt is a 36 yo female presenting 3/23 due to bacteremia. MRI of spine showed cervical and lumbar epidural abscess, is s/p L4-5 laminectomy and debridement of abscess 3/25.  CT abdomen showed bowel perforation, and pt underwent abdominal surgery 3/27. I&D of R clavicle with application of wound vac on 3/31 to address osteomyelitis. PMH includes: anxiety, depression, suicie attempt, polysubstance abuse, and R clavicle fx ~8 months prior to admission. In 08/2021, pt admitted for infection of R clavicle, underwent decompression of abscess and CT revealed 8cm mass in clavicle with bony destruction. ? ?  ?PT Comments  ? ? The pt was able to demo good recall of log roll technique and is making steady progress with mobility goals. She continued to need min-modA for all bed mobility, sit-stand transfers, and to steady with short bouts of ambulation in the room. The pt requires modA and increased time to power up due to poor functional strength and power in BLE, and was fatigued after ~5 ft ambulation due to deconditioning. Continue to recommend acute inpatient rehab at d/c to maximize pt recovery and return to independence.  ?   ?Recommendations for follow up therapy are one component of a multi-disciplinary discharge planning process, led by the attending physician.  Recommendations may be updated based on patient status, additional functional criteria and insurance authorization. ? ?Follow Up Recommendations ? Acute inpatient rehab (3hours/day) ?  ?  ?Assistance Recommended at Discharge Frequent or constant Supervision/Assistance  ?Patient can return home with the following Two people to help with walking and/or transfers;A lot of help with bathing/dressing/bathroom;Assistance with cooking/housework;Direct supervision/assist for medications management;Assist for  transportation;Help with stairs or ramp for entrance;Direct supervision/assist for financial management ?  ?Equipment Recommendations ? Rolling walker (2 wheels)  ?  ?Recommendations for Other Services   ? ? ?  ?Precautions / Restrictions Precautions ?Precautions: Fall;Back ?Precaution Comments: JP drain and wound vac R clavicle ?Restrictions ?Weight Bearing Restrictions: Yes ?RUE Weight Bearing: Weight bearing as tolerated  ?  ? ?Mobility ? Bed Mobility ?Overal bed mobility: Needs Assistance ?Bed Mobility: Rolling, Sidelying to Sit, Sit to Sidelying ?Rolling: Min assist ?Sidelying to sit: Mod assist ?  ?  ?Sit to sidelying: Mod assist ?General bed mobility comments: monitored back and abd precautions ?  ? ?Transfers ?Overall transfer level: Needs assistance ?Equipment used: 1 person hand held assist, Rolling walker (2 wheels) ?Transfers: Sit to/from Stand, Bed to chair/wheelchair/BSC ?Sit to Stand: Mod assist ?  ?Step pivot transfers: Min assist ?  ?  ?  ?General transfer comment: modA to power up with multiple attempts and max cues to push through BLE. pt able to rise slowly. Assist to steady in stance ?  ? ?Ambulation/Gait ?Ambulation/Gait assistance: Min assist ?Gait Distance (Feet): 3 Feet ?Assistive device: Rolling walker (2 wheels) ?Gait Pattern/deviations: Step-through pattern, Decreased stride length ?  ?  ?  ?General Gait Details: significant sway and instability, min-modA to steady at times with BUE support on RW ? ? ? ?  ? ? ?  ?Balance Overall balance assessment: Needs assistance ?Sitting-balance support: Feet supported ?Sitting balance-Leahy Scale: Fair ?  ?  ?Standing balance support: Bilateral upper extremity supported ?Standing balance-Leahy Scale: Poor ?Standing balance comment: UE support on walker and min A for safety ?  ?  ?  ?  ?  ?  ?  ?  ?  ?  ?  ?  ? ?  ?  Cognition Arousal/Alertness: Awake/alert ?Behavior During Therapy: Anxious ?Overall Cognitive Status: Within Functional Limits for tasks  assessed ?  ?  ?  ?  ?  ?  ?  ?  ?  ?  ?  ?  ?  ?  ?  ?  ?General Comments: pt follwoing cues well, does need some cues for safety with lines/tubes ?  ?  ? ?  ?Exercises   ? ?  ?General Comments General comments (skin integrity, edema, etc.): VSS on RA ?  ?  ? ?Pertinent Vitals/Pain Pain Assessment ?Pain Assessment: Faces ?Faces Pain Scale: Hurts even more ?Breathing: normal ?Negative Vocalization: none ?Pain Location: back, abdomen, hips ?Pain Descriptors / Indicators: Aching, Guarding, Grimacing ?Pain Intervention(s): Monitored during session, Repositioned  ? ? ? ?PT Goals (current goals can now be found in the care plan section) Acute Rehab PT Goals ?Patient Stated Goal: to go to rehab ?PT Goal Formulation: With patient ?Time For Goal Achievement: 09/14/21 ?Potential to Achieve Goals: Good ?Progress towards PT goals: Progressing toward goals ? ?  ?Frequency ? ? ? Min 3X/week ? ? ? ?  ?PT Plan Current plan remains appropriate  ? ? ?   ?AM-PAC PT "6 Clicks" Mobility   ?Outcome Measure ? Help needed turning from your back to your side while in a flat bed without using bedrails?: A Lot ?Help needed moving from lying on your back to sitting on the side of a flat bed without using bedrails?: A Lot ?Help needed moving to and from a bed to a chair (including a wheelchair)?: A Lot ?Help needed standing up from a chair using your arms (e.g., wheelchair or bedside chair)?: A Lot ?Help needed to walk in hospital room?: A Lot ?Help needed climbing 3-5 steps with a railing? : Total ?6 Click Score: 11 ? ?  ?End of Session Equipment Utilized During Treatment: Gait belt ?Activity Tolerance: Patient limited by pain ?Patient left: in bed;with call bell/phone within reach;with bed alarm set (in bed due to impending clavicle procedure) ?Nurse Communication: Mobility status ?PT Visit Diagnosis: Other abnormalities of gait and mobility (R26.89);Muscle weakness (generalized) (M62.81);Pain ?Pain - Right/Left: Right ?Pain - part of body:  Shoulder (abd, back) ?  ? ? ?Time: 6503-5465 ?PT Time Calculation (min) (ACUTE ONLY): 20 min ? ?Charges:  $Therapeutic Exercise: 8-22 mins          ?          ? ?Vickki Muff, PT, DPT  ? ?Acute Rehabilitation Department ?Pager #: [phone] ? ? ?Ronnie Derby ?09/05/2021, 5:11 PM ? ?

## 2021-09-05 NOTE — Significant Event (Signed)
Kimberly Booker of the L-spine was done because of patient complaining of worsening low back pain.  MRI results were discussed with on-call neurosurgical team.  At this time since patient does not have any neurodeficits neurosurgical team advised continue to observe.  I placed patient on neurochecks overnight. ? ?Midge Minium ?

## 2021-09-05 NOTE — Progress Notes (Signed)
2 Days Post-Op  ? ?Subjective/Chief Complaint: ?Complains of lower abd pain. Has to pee very badly. Denies upper abd pain or n/v ? ? ?Objective: ?Vital signs in last 24 hours: ?Temp:  [98.1 ?F (36.7 ?C)-98.7 ?F (37.1 ?C)] 98.3 ?F (36.8 ?C) (04/02 3532) ?Pulse Rate:  [88-100] 92 (04/02 0834) ?Resp:  [17] 17 (04/02 0834) ?BP: (100-119)/(61-80) 111/61 (04/02 0834) ?SpO2:  [98 %-100 %] 100 % (04/02 0834) ?Last BM Date : 08/25/21 ? ?Intake/Output from previous day: ?04/01 0701 - 04/02 0700 ?In: 4315.9 [P.O.:180; I.V.:3035.9; IV Piggyback:1100] ?Out: 1090 [Urine:600; Drains:490] ?Intake/Output this shift: ?No intake/output data recorded. ? ?General appearance: alert and cooperative ?Resp: clear to auscultation bilaterally ?Cardio: regular rate and rhythm ?GI: upper abd seems soft. Lower abd tender. Incision looks good. Drain output serosanguinous ? ?Lab Results:  ?Recent Labs  ?  09/04/21 ?0252 09/05/21 ?0231  ?WBC 14.3* 16.8*  ?HGB 9.3* 8.8*  ?HCT 29.8* 27.2*  ?PLT 628* 573*  ? ?BMET ?Recent Labs  ?  09/04/21 ?0252  ?NA 131*  ?K 4.0  ?CL 103  ?CO2 22  ?GLUCOSE 139*  ?BUN 10  ?CREATININE 0.34*  ?CALCIUM 8.2*  ? ?PT/INR ?No results for input(s): LABPROT, INR in the last 72 hours. ?ABG ?No results for input(s): PHART, HCO3 in the last 72 hours. ? ?Invalid input(s): PCO2, PO2 ? ?Studies/Results: ?No results found. ? ?Anti-infectives: ?Anti-infectives (From admission, onward)  ? ? Start     Dose/Rate Route Frequency Ordered Stop  ? 09/03/21 1355  vancomycin (VANCOCIN) powder  Status:  Discontinued       ?   As needed 09/03/21 1355 09/03/21 1426  ? 09/01/21 2200  vancomycin (VANCOREADY) IVPB 1750 mg/350 mL       ? 1,750 mg ?175 mL/hr over 120 Minutes Intravenous Every 12 hours 09/01/21 1058    ? 08/30/21 1000  metroNIDAZOLE (FLAGYL) IVPB 500 mg  Status:  Discontinued       ? 500 mg ?100 mL/hr over 60 Minutes Intravenous Every 12 hours 08/30/21 0036 08/30/21 1708  ? 08/30/21 0600  metroNIDAZOLE (FLAGYL) IVPB 500 mg  Status:   Discontinued       ? 500 mg ?100 mL/hr over 60 Minutes Intravenous On call to O.R. 08/30/21 0220 08/30/21 0229  ? 08/30/21 0245  ciprofloxacin (CIPRO) IVPB 400 mg  Status:  Discontinued       ? 400 mg ?200 mL/hr over 60 Minutes Intravenous Every 12 hours 08/30/21 0037 08/30/21 1708  ? 08/30/21 0045  cefTRIAXone (ROCEPHIN) 2 g in sodium chloride 0.9 % 100 mL IVPB  Status:  Discontinued       ? 2 g ?200 mL/hr over 30 Minutes Intravenous Every 24 hours 08/30/21 0036 08/30/21 0036  ? 08/29/21 2315  metroNIDAZOLE (FLAGYL) IVPB 500 mg       ?Note to Pharmacy: TO MAIN OR STAT!! In surgery now  ? 500 mg ?100 mL/hr over 60 Minutes Intravenous To Surgery 08/29/21 2302 08/29/21 2319  ? 08/29/21 2200  vancomycin (VANCOREADY) IVPB 1250 mg/250 mL  Status:  Discontinued       ? 1,250 mg ?166.7 mL/hr over 90 Minutes Intravenous Every 12 hours 08/29/21 1857 09/01/21 1058  ? 08/29/21 2100  Vancomycin (VANCOCIN) 1,250 mg in sodium chloride 0.9 % 250 mL IVPB  Status:  Discontinued       ? 1,250 mg ?166.7 mL/hr over 90 Minutes Intravenous Every 12 hours 08/29/21 0837 08/29/21 1858  ? 08/29/21 0930  vancomycin (VANCOCIN) 2,000 mg in sodium  chloride 0.9 % 500 mL IVPB       ? 2,000 mg ?260 mL/hr over 120 Minutes Intravenous  Once 08/29/21 0837 08/29/21 1257  ? 08/26/21 2000  cefTRIAXone (ROCEPHIN) 2 g in sodium chloride 0.9 % 100 mL IVPB  Status:  Discontinued       ? 2 g ?200 mL/hr over 30 Minutes Intravenous Every 24 hours 08/26/21 1614 08/28/21 1736  ? 08/26/21 2000  DAPTOmycin (CUBICIN) 700 mg in sodium chloride 0.9 % IVPB  Status:  Discontinued       ? 8 mg/kg ? 87.2 kg ?128 mL/hr over 30 Minutes Intravenous Daily 08/26/21 1819 08/29/21 0842  ? ?  ? ? ?Assessment/Plan: ?s/p Procedure(s): ?DEBRIDEMENT RIGHT CLAVICLE (Right) ?APPLICATION OF WOUND VAC (Right) ?Continue abx and drain ?POD 7, S/P Dx laparoscopy, exploratory laparotomy, graham patch repair of gastric perforation 3/26 Dr. Sophronia Simas for perforated gastric ulcer in  posterior stomach ?- h.pylori negative ?-adv to full liquids today ?- monitor drain output, but SS right now and no signs of bile ?- PPI BID  ?- IS/Pulm toilet, OOB as able, PT/OT consults ?  ?  ?FEN: FLD, IVFs ?ID: per ID, Flagyl, Vanc, Cipro  ?VTE: SCD's, Lovenox  ?Foley: out ?  ?MRSA bacteremia ?R shoulder abscess/clavicle abscess - OR 3/31 by Dr. Lajoyce Corners ?B psoas abscess - S/P drainage by IR 3/30 ?Epidural abscess s/p lumbar laminectomy and drainage ?Pleural effusion s/p R pigtail chest tube 3/26 by CCM ?  ?PMH IVDA ?Suspected septic emboli  ? LOS: 10 days  ? ? ?Kimberly Booker ?09/05/2021 ? ?

## 2021-09-05 NOTE — Progress Notes (Signed)
Pt brought down to MRI for exam. Upon assessing pt, pt moving whole body continuously due to pain. Unable to obtain diagnostic images in that state. Explained to pt that they would need to remain still to obtain exam. Pt acknowledged and said they would do their best. Upon preparing pt for exam, pt stated they didn't think they could do it due to pain level. Pt ultimately decided to hold off on exam until pain is under control. RN made aware. Per RN, next dose of meds to be given in about an hour. Plan to re-attempt exam then. ?

## 2021-09-05 NOTE — Progress Notes (Signed)
Patient ID: Kimberly Booker, female   DOB: 06-10-1985, 36 y.o.   MRN: [id number] ?Patient is status post debridement right clavicle osteomyelitis and abscess.  There is 75 cc in the wound VAC canister patient states she has no shoulder pain but states her pain is primarily in her back.  Tissue cultures from the clavicle debridement are showing Staph aureus.  Sensitivities pending. ?

## 2021-09-06 ENCOUNTER — Encounter (HOSPITAL_COMMUNITY): Payer: Self-pay | Admitting: Orthopedic Surgery

## 2021-09-06 DIAGNOSIS — R7881 Bacteremia: Secondary | ICD-10-CM | POA: Diagnosis not present

## 2021-09-06 DIAGNOSIS — R52 Pain, unspecified: Secondary | ICD-10-CM

## 2021-09-06 DIAGNOSIS — B9562 Methicillin resistant Staphylococcus aureus infection as the cause of diseases classified elsewhere: Secondary | ICD-10-CM | POA: Diagnosis not present

## 2021-09-06 LAB — VANCOMYCIN, TROUGH: Vancomycin Tr: 12 ug/mL — ABNORMAL LOW (ref 15–20)

## 2021-09-06 LAB — VANCOMYCIN, PEAK: Vancomycin Pk: 38 ug/mL (ref 30–40)

## 2021-09-06 MED ORDER — VANCOMYCIN HCL 1500 MG/300ML IV SOLN
1500.0000 mg | Freq: Two times a day (BID) | INTRAVENOUS | Status: DC
  Administered 2021-09-06 – 2021-09-26 (×40): 1500 mg via INTRAVENOUS
  Filled 2021-09-06 (×43): qty 300

## 2021-09-06 MED ORDER — SENNOSIDES-DOCUSATE SODIUM 8.6-50 MG PO TABS
2.0000 | ORAL_TABLET | Freq: Two times a day (BID) | ORAL | Status: DC
  Administered 2021-09-06 – 2021-10-20 (×22): 2 via ORAL
  Filled 2021-09-06 (×78): qty 2

## 2021-09-06 MED ORDER — POLYETHYLENE GLYCOL 3350 17 G PO PACK
17.0000 g | PACK | Freq: Every day | ORAL | Status: DC
  Administered 2021-09-06 – 2021-10-07 (×8): 17 g via ORAL
  Filled 2021-09-06 (×33): qty 1

## 2021-09-06 MED ORDER — PANTOPRAZOLE SODIUM 40 MG PO TBEC
40.0000 mg | DELAYED_RELEASE_TABLET | Freq: Two times a day (BID) | ORAL | Status: DC
  Administered 2021-09-06 – 2021-10-29 (×106): 40 mg via ORAL
  Filled 2021-09-06 (×106): qty 1

## 2021-09-06 NOTE — Progress Notes (Signed)
Inpatient Rehab Admissions Coordinator:  ? ?Per ID, not a candidate for home IV abx, with plan to continue for at least 8 weeks.  This would preclude her from being a candidate for CIR as our average length of stay is 2 weeks and insurance will not approve SNF stay following CIR admission.  Will sign off at this time.  ? ?Estill Dooms, PT, DPT ?Admissions Coordinator ?[phone] ?09/06/21  ?1:06 PM ? ?

## 2021-09-06 NOTE — Progress Notes (Signed)
Occupational Therapy Treatment ?Patient Details ?Name: Kimberly Booker ?MRN: [id number] ?DOB: 1985/12/05 ?Today's Date: 09/06/2021 ? ? ?History of present illness The pt is a 36 yo female presenting 3/23 due to bacteremia. MRI of spine showed cervical and lumbar epidural abscess, is s/p L4-5 laminectomy and debridement of abscess 3/25.  CT abdomen showed bowel perforation, and pt underwent abdominal surgery 3/27. I&D of R clavicle with application of wound vac on 3/31 to address osteomyelitis. PMH includes: anxiety, depression, suicie attempt, polysubstance abuse, and R clavicle fx ~8 months prior to admission. In 08/2021, pt admitted for infection of R clavicle, underwent decompression of abscess and CT revealed 8cm mass in clavicle with bony destruction. ?  ?OT comments ? Pt progressing towards goals this session, eager to get OOB. Pt able to complete bed mobility and transfers with min A using RW. Pt required elevated surface and x3 attempts to stand with RW. Pt min A for step pivot transfer to chair and max A for LB dressing. Completed seated grooming task in chair with set up A. Pt presenting with impairments listed below, will follow acutely. Continue to recommend AIR at d/c.  ? ?Recommendations for follow up therapy are one component of a multi-disciplinary discharge planning process, led by the attending physician.  Recommendations may be updated based on patient status, additional functional criteria and insurance authorization. ?   ?Follow Up Recommendations ? Acute inpatient rehab (3hours/day)  ?  ?Assistance Recommended at Discharge Intermittent Supervision/Assistance  ?Patient can return home with the following ? Two people to help with walking and/or transfers;A lot of help with bathing/dressing/bathroom;Assistance with feeding;Assist for transportation;Help with stairs or ramp for entrance ?  ?Equipment Recommendations ? Tub/shower seat;BSC/3in1  ?  ?Recommendations for Other Services Rehab consult ? ?   ?Precautions / Restrictions Precautions ?Precautions: Fall;Back ?Precaution Comments: JP drain and wound vac R clavicle ?Restrictions ?Weight Bearing Restrictions: Yes ?RUE Weight Bearing: Weight bearing as tolerated  ? ? ?  ? ?Mobility Bed Mobility ?Overal bed mobility: Needs Assistance ?Bed Mobility: Rolling, Sidelying to Sit, Sit to Sidelying ?  ?Sidelying to sit: Min assist ?  ?  ?  ?General bed mobility comments: for trunk elevation ?  ? ?Transfers ?Overall transfer level: Needs assistance ?Equipment used: Rolling walker (2 wheels) ?Transfers: Sit to/from Stand, Bed to chair/wheelchair/BSC ?Sit to Stand: Mod assist ?  ?  ?Step pivot transfers: Min assist ?  ?  ?General transfer comment: from elevated surface, x3 attempts to stand ?  ?  ?Balance Overall balance assessment: Needs assistance ?Sitting-balance support: Feet supported ?Sitting balance-Leahy Scale: Fair ?  ?  ?Standing balance support: Bilateral upper extremity supported ?Standing balance-Leahy Scale: Poor ?Standing balance comment: UE support on walker and min A for safety ?  ?  ?  ?  ?  ?  ?  ?  ?  ?  ?  ?   ? ?ADL either performed or assessed with clinical judgement  ? ?ADL Overall ADL's : Needs assistance/impaired ?  ?  ?Grooming: Oral care;Wash/dry face;Sitting ?Grooming Details (indicate cue type and reason): completed sitting up in chair ?  ?  ?  ?  ?  ?  ?Lower Body Dressing: Maximal assistance;Bed level;Sitting/lateral leans ?Lower Body Dressing Details (indicate cue type and reason): to don socks ?Toilet Transfer: Minimal assistance;Rolling walker (2 wheels);BSC/3in1;Stand-pivot ?Toilet Transfer Details (indicate cue type and reason): simulated to chair ?  ?  ?  ?  ?Functional mobility during ADLs: Minimal assistance;Rolling walker (2 wheels) ?  ?  ? ?  Extremity/Trunk Assessment Upper Extremity Assessment ?Upper Extremity Assessment: Generalized weakness ?  ?Lower Extremity Assessment ?Lower Extremity Assessment: Overall WFL for tasks  assessed ?  ?  ?  ? ?Vision   ?Vision Assessment?: No apparent visual deficits ?  ?Perception Perception ?Perception: Not tested ?  ?Praxis Praxis ?Praxis: Not tested ?  ? ?Cognition Arousal/Alertness: Awake/alert ?Behavior During Therapy: Anxious ?Overall Cognitive Status: Within Functional Limits for tasks assessed ?  ?  ?  ?  ?  ?  ?  ?  ?  ?  ?  ?  ?  ?  ?  ?  ?  ?  ?  ?   ?Exercises   ? ?  ?Shoulder Instructions   ? ? ?  ?General Comments VSS on RA  ? ? ?Pertinent Vitals/ Pain       Pain Assessment ?Pain Assessment: Faces ?Pain Score: 5  ?Faces Pain Scale: Hurts even more ?Pain Location: back, abdomen, hips ?Pain Descriptors / Indicators: Aching, Guarding, Grimacing ?Pain Intervention(s): Limited activity within patient's tolerance, Monitored during session, Repositioned ? ?Home Living   ?  ?  ?  ?  ?  ?  ?  ?  ?  ?  ?  ?  ?  ?  ?  ?  ?  ?  ? ?  ?Prior Functioning/Environment    ?  ?  ?  ?   ? ?Frequency ? Min 3X/week  ? ? ? ? ?  ?Progress Toward Goals ? ?OT Goals(current goals can now be found in the care plan section) ? Progress towards OT goals: Progressing toward goals ? ?Acute Rehab OT Goals ?Patient Stated Goal: to move more ?OT Goal Formulation: With patient ?Time For Goal Achievement: 09/15/21 ?Potential to Achieve Goals: Good ?ADL Goals ?Pt Will Perform Upper Body Dressing: with min guard assist;sitting;bed level ?Pt Will Perform Lower Body Dressing: with min guard assist;sitting/lateral leans;bed level ?Pt Will Transfer to Toilet: with mod assist;stand pivot transfer;bedside commode ?Pt Will Perform Tub/Shower Transfer: with min assist;ambulating;Shower transfer;Tub transfer;shower seat  ?Plan Discharge plan remains appropriate;Frequency remains appropriate   ? ?Co-evaluation ? ? ?   ?  ?  ?  ?  ? ?  ?AM-PAC OT "6 Clicks" Daily Activity     ?Outcome Measure ? ? Help from another person eating meals?: None ?Help from another person taking care of personal grooming?: A Little ?Help from another person  toileting, which includes using toliet, bedpan, or urinal?: A Lot ?Help from another person bathing (including washing, rinsing, drying)?: A Lot ?Help from another person to put on and taking off regular upper body clothing?: A Lot ?Help from another person to put on and taking off regular lower body clothing?: A Lot ?6 Click Score: 15 ? ?  ?End of Session Equipment Utilized During Treatment: Gait belt;Rolling walker (2 wheels) ? ?OT Visit Diagnosis: Unsteadiness on feet (R26.81);Other abnormalities of gait and mobility (R26.89);Muscle weakness (generalized) (M62.81);Pain ?  ?Activity Tolerance Patient tolerated treatment well ?  ?Patient Left in chair;with call bell/phone within reach;with chair alarm set;with nursing/sitter in room ?  ?Nurse Communication Mobility status ?  ? ?   ? ?Time: [id number] ?OT Time Calculation (min): 28 min ? ?Charges: OT General Charges ?$OT Visit: 1 Visit ?OT Treatments ?$Self Care/Home Management : 23-37 mins ? ?Alfonzo Beers, OTD, OTR/L ?Acute Rehab ?[phone] ? ? ?[name] ?09/06/2021, 4:52 PM ?

## 2021-09-06 NOTE — Progress Notes (Addendum)
? ?[facility] ?PROGRESS NOTE ? ? ?Kimberly Booker ZMO:[id number] DOB: Aug 09, 1985 DOA: 08/26/2021  11 ?DOS: the patient was seen and examined on 09/06/2021 ? ?PCP: Pcp, No ? ?Brief History and Hospital Course:  ?36 year old with past medical history significant for anxiety, depression, suicidal attempt drug overdose, polysubstance abuse, right clavicle fracture, was admitted at [facility] on 08/15/2021 due to right shoulder abscess.She was treated with I&D in the emergency department followed by IV antibiotics therapy as an inpatient.  She was diagnosed with MRSA bacteremia, endocarditis was ruled out with a TEE performed 3/16, but she continued to have positive blood cultures despite treatment with ceftriaxone and vancomycin.  Antibiotic therapy was changed to daptomycin.  She was then transferred to [facility]. ?Extensive imagings have been done here which showed right clavicle osteomyelitis/phlegmon/abscess, widespread cervical, thoracic, lumbar discitis/osteomyelitis/epidural abscess/cord impingement.  MRI also showed bilateral iliopsoas abscess, right complex pleural effusion.  ID, orthopedics, neurosurgery following. ?Underwent lumbar laminectomy/debridement of abscess, L4-L5 on 3/25. Hospital course also remarkable for finding of right-sided empyema, gastric perforation. Status post emergent laparotomy and perforation of repair by general surgery.  PCCM was also following for chest tube on the right side, which was removed on 3/29. ?Underwent aspiration of the psoas abscesses on 3/30.  ?Underwent debridement of the right clavicle and application of wound VAC to the right shoulder area on 3/31. ? ?Consultants: Infectious disease, neurosurgery, orthopedics, interventional radiology.  General surgery ? ?Procedures:   ? ?3/31: DEBRIDEMENT RIGHT CLAVICLE, APPLICATION OF WOUND VAC ? ?3/30: Aspiration of bilateral psoas abscesses by interventional radiology ? ?3/27: Diagnostic laparoscopy, exploratory  laparotomy, Cheree Ditto patch repair of gastric perforation ? ?3/25: L4-L5 laminectomy, debridement of epidural abscess ? ? ?Subjective: ?Patient mentions that her back pain is much better this morning.  Able to move her legs.  No other complaints offered at this time  ? ? ?Assessment/Plan: ? ? ?* MRSA bacteremia ?Patient has disseminated MRSA bacteremia.  ?She had TEE outside facility on 3/16, negative for endocarditis as per the report  ?TTE did not show any vegetation, might need TEE here but was delayed due emergent surgery for gastric perforation and other surgical procedures as outlined.  Will defer to ID. ?ID is following.   ?Patient is on vancomycin.  WBC has been fluctuating.  She remains afebrile.  No blood work noted this morning.  Will order for tomorrow.  Last blood cultures from 3/24 negative. ? ?Gastric perforation (HCC) ?Complained of abdominal pain on 3/26.  Chest x-ray done after chest tube placement showed incidental finding of pneumoperitoneum.  CT abdomen/pelvis confirmed pneumoperitoneum.  General surgery consulted and she underwent emergent exploratory laparatomy with finding of gastric perforation.  ?General surgery continues to follow.  Currently on full liquid diet. ? ?Abscess in epidural space of cervical spine ?Discitis/osteomyelitis at C2-3, C5-6, and C6-7. Ventral epidural abscess at C2-3, C5, and C6 with cord impingement especially at the lower 2 levels. Facet arthritis on the left at C7-T1 and right at C3-4. ?-Also showed discitis/osteomyelitis of T9- T10, T11-T12 ?-Neurosurgery following periodically.  No neurological weakness noted ? 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 Bilateral facet arthritis at L5-S1. Dorsal epidural abscess at L4 and L5,highly compressive on the thecal sac. ?S/P  lumbar laminectomy, debridement of abscess, L4-L5 on 3/25. ?No further interventions planned by neurosurgery.    ?Yesterday patient was complaining of severe pain in the lower back radiating down to her legs.  MRI lumbar  spine was ordered. ?It redemonstrated the osteomyelitis at T11-12 and L5-S1.  Noted to be status post L4-L5 laminectomy and debridement with significant decrease in the amount of the abscess.  A small collection was noted posterior to the S1 causing moderate thecal sac narrowing.  Other findings of iliopsoas abscess and sacroiliac septic arthritis were noted as seen previously on MRI pelvis. ?These findings were discussed with the neurosurgery group overnight.  Patient does not have any focal neurological deficits.  No intervention was recommended. ?Pain is better controlled today.  She is able to move her lower extremities without difficulty.  Patient reassured.  Continue to monitor. ? ?Iliopsoas abscess (HCC) ?Sacroiliac septic arthritis/osteomyelitis ? ?Pelvic MRI showed bilateral infectious sacroiliitis and osteomyelitis of the sacrum and periarticular right iliac bone. Bilateral psoas abscesses, Small bilateral paraspinal muscle abscesses, intramuscular abscess within the left piriformis muscle, small intramuscular abscess within the left gluteus medius muscle. ?Seen by interventional radiology and underwent aspiration on 3/30.  MRSA noted on cultures.   ? ?Pleural effusion ?Chest CT showed a large loculated right pleural effusion concerning for empyema, subcentimeter groundglass nodularity within the bilateral lungs concerning for septic emboli.  ?S/P chest tube placement.  Fluid noted to be exudative.  Cultures without any growth.  No need for DNase/tPA.   ?Chest x-ray showed resolution of the effusion.  Chest tube was discontinued on 3/29.  Respiratory status is stable. ? ?Normocytic anemia ?Hemoglobin low but stable.   ?Received 2 units PRBC at outside facility.  ? ?Anxiety with depression ?Noted to be on Prozac and Ativan as needed. ? ?Acute osteomyelitis of clavicle, right (HCC) ?MRI of the  shoulder showed osteomyelitis, phlegmon, possible abscess.  Orthopedics following.  Underwent debridement of right clavicle and application of wound VAC on 3/31.  Cultures pending. ? ? ?Polysubstance abuse (HCC) ?She declined IV drug abuse recently, last drug use was 2 years ago as per patient. ? ?Pain management ?Patient with MRSA infection at multiple sites.  Subsequently with gastric perforation.  Pain management has been difficult.  She was subsequently started on a fentanyl patch by orthopedics.  Also on Dilaudid intravenously as needed.  Percocet orally as needed.  Continue stool softeners.  Changed MiraLAX to scheduled. ? ?Transaminitis ?HCV antibody positive ?HCV quantitative was not detected. ? ?LFTs noted to be normal on 3/30. ? ? ? ? ? ?DVT Prophylaxis: Lovenox ?Code Status: Full code ?Family Communication: Discussed with patient ?Disposition Plan: Not a candidate for home IV antibiotic therapy due to history of polysubstance abuse. ? ?Status is: Inpatient ?Remains inpatient appropriate because: IV antibiotics.  Need for surgical interventions ? ? ? ? ?Medications: Scheduled: ? docusate sodium  100 mg Oral BID  ? enoxaparin (LOVENOX) injection  40 mg Subcutaneous Q24H  ? [START ON 09/07/2021] fentaNYL  1 patch Transdermal Q72H  ? FLUoxetine  40 mg Oral Daily  ? lidocaine  1 patch Transdermal Q24H  ? mouth rinse  15 mL Mouth Rinse BID  ? nicotine  21 mg Transdermal Daily  ? pantoprazole  40 mg Intravenous Q12H  ? sodium chloride flush  10 mL Other Q8H  ? ?Continuous: ? sodium chloride 10 mL/hr at 09/06/21 0000  ? lactated ringers Stopped (09/04/21 1500)  ? methocarbamol (ROBAXIN) IV 1,000 mg (09/06/21 0500)  ? methocarbamol (ROBAXIN) IV    ? vancomycin Stopped (09/06/21 0015)  ? ?AVW:[id number]:bisacodyl, HYDROmorphone (DILAUDID) injection, lip balm, LORazepam, methocarbamol **OR** methocarbamol (ROBAXIN) IV, metoCLOPramide **OR** metoCLOPramide (REGLAN) injection, ondansetron **OR** ondansetron (ZOFRAN) IV,  oxyCODONE-acetaminophen,  phenol, polyethylene glycol ? ?Antibiotics: ?Anti-infectives (From admission, onward)  ? ? Start     Dose/Rate Route Frequency Ordered Stop  ? 09/03/21 1355  vancomycin (VANCOCIN) powder  Status:  Dis

## 2021-09-06 NOTE — Progress Notes (Signed)
? ? ?[facility] ? ?Date of Admission:  08/26/2021    ? ?Total days of antibiotics 11 ?        ?ASSESSMENT: ? ?Kimberly Booker appears to be tolerating vancomycin and there is no evidence of nephrotoxicity. Most recent vancomycin trough was therapeutic at 12. Pain adequately managed. Right clavicle tissue cultures remain without growth. Continue current dose of vancomycin and therapeutic drug monitoring of renal function and vancomycin levels. Source control appears to be achieved for now and will continue to monitor. Wound care per Orthopedics and Neurosurgery. Remaining medical and supportive care per primary team.  ? ?PLAN: ? ?Continue current dose of vancomycin.  ?Therapeutic drug monitoring of renal function and vancomycin levels. ?Wound care per Neurosurgery and Orthopedics. ?Remaining medical and supportive care per primary team.  ? ?Principal Problem: ?  MRSA bacteremia ?Active Problems: ?  Pathologic fracture of right clavicle ?  Anxiety with depression ?  Polysubstance abuse (HCC) ?  Transaminitis ?  Normocytic anemia ?  Abscess in epidural space of lumbar spine ?  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  Pleural effusion ?  Iliopsoas abscess (HCC) ?  Gastric perforation (HCC) ?  Acute osteomyelitis of clavicle, right (HCC) ?  Pain management ? ? ? enoxaparin (LOVENOX) injection  40 mg Subcutaneous Q24H  ? [START ON 09/07/2021] fentaNYL  1 patch Transdermal Q72H  ? FLUoxetine  40 mg Oral Daily  ? lidocaine  1 patch Transdermal Q24H  ? mouth rinse  15 mL Mouth Rinse BID  ? nicotine  21 mg Transdermal Daily  ? pantoprazole  40 mg Oral BID  ? polyethylene glycol  17 g Oral Daily  ? senna-docusate  2 tablet Oral BID  ? sodium chloride flush  10 mL Other Q8H  ? ? ?SUBJECTIVE: ? ?Afebrile overnight with no acute events. Pain adequately managed. No new concerns/complaints.  ? ?Allergies  ?Allergen Reactions  ? Amoxicillin Swelling  ?  Throat closes  ? ? ? ?Review of Systems: ?Review of  Systems  ?Constitutional:  Negative for chills, fever and weight loss.  ?Respiratory:  Negative for cough, shortness of breath and wheezing.   ?Cardiovascular:  Negative for chest pain and leg swelling.  ?Gastrointestinal:  Negative for abdominal pain, constipation, diarrhea, nausea and vomiting.  ?Skin:  Negative for rash.  ? ? ? ?OBJECTIVE: ?Vitals:  ? 09/05/21 1953 09/05/21 1954 09/06/21 0429 09/06/21 0829  ?BP: 102/84 102/84 109/71 124/68  ?Pulse: (!) 101 (!) 101 92 96  ?Resp: 18 18  20   ?Temp: 98.8 ?F (37.1 ?C) 98.8 ?F (37.1 ?C) 98.6 ?F (37 ?C) 98.1 ?F (36.7 ?C)  ?TempSrc: Oral  Oral Oral  ?SpO2: 94%  100% 100%  ?Weight:      ?Height:      ? ?Body mass index is 26.63 kg/m?. ? ?Physical Exam ?Constitutional:   ?   General: She is not in acute distress. ?   Appearance: She is well-developed.  ?Cardiovascular:  ?   Rate and Rhythm: Normal rate and regular rhythm.  ?   Heart sounds: Normal heart sounds.  ?Pulmonary:  ?   Effort: Pulmonary effort is normal.  ?   Breath sounds: Normal breath sounds.  ?Skin: ?   General: Skin is warm and dry.  ?Neurological:  ?   Mental Status: She is alert.  ? ? ?Lab Results ?Lab Results  ?Component Value Date  ? WBC 16.8 (H) 09/05/2021  ? HGB 8.8 (L) 09/05/2021  ?  HCT 27.2 (L) 09/05/2021  ? MCV 92.5 09/05/2021  ? PLT 573 (H) 09/05/2021  ?  ?Lab Results  ?Component Value Date  ? CREATININE 0.34 (L) 09/04/2021  ? BUN 10 09/04/2021  ? NA 131 (L) 09/04/2021  ? K 4.0 09/04/2021  ? CL 103 09/04/2021  ? CO2 22 09/04/2021  ?  ?Lab Results  ?Component Value Date  ? ALT 40 09/02/2021  ? AST 24 09/02/2021  ? ALKPHOS 76 09/02/2021  ? BILITOT 0.8 09/02/2021  ?  ? ?Microbiology: ?Recent Results (from the past 240 hour(s))  ?Aerobic/Anaerobic Culture w Gram Stain (surgical/deep wound)     Status: None  ? Collection Time: 08/28/21  1:42 PM  ? Specimen: Wound; Abscess  ?Result Value Ref Range Status  ? Specimen Description WOUND  Final  ? Special Requests LUMBAR EPIDURAL ABSCESS SPEC A  Final  ?  Gram Stain NO WBC SEEN ?RARE GRAM POSITIVE COCCI ?  Final  ? Culture   Final  ?  FEW METHICILLIN RESISTANT STAPHYLOCOCCUS AUREUS ?NO ANAEROBES ISOLATED ?Performed at [facility], [address] [place], [place] [address] ?  ? Report Status 09/02/2021 FINAL  Final  ? Organism ID, Bacteria METHICILLIN RESISTANT STAPHYLOCOCCUS AUREUS  Final  ?    Susceptibility  ? Methicillin resistant staphylococcus aureus - MIC*  ?  CIPROFLOXACIN >=8 RESISTANT Resistant   ?  ERYTHROMYCIN >=8 RESISTANT Resistant   ?  GENTAMICIN <=0.5 SENSITIVE Sensitive   ?  OXACILLIN >=4 RESISTANT Resistant   ?  TETRACYCLINE <=1 SENSITIVE Sensitive   ?  VANCOMYCIN 1 SENSITIVE Sensitive   ?  TRIMETH/SULFA >=320 RESISTANT Resistant   ?  CLINDAMYCIN >=8 RESISTANT Resistant   ?  RIFAMPIN <=0.5 SENSITIVE Sensitive   ?  Inducible Clindamycin NEGATIVE Sensitive   ?  * FEW METHICILLIN RESISTANT STAPHYLOCOCCUS AUREUS  ?Body fluid culture w Gram Stain     Status: None  ? Collection Time: 08/29/21  2:54 PM  ? Specimen: Pleural Fluid  ?Result Value Ref Range Status  ? Specimen Description PLEURAL FLUID  Final  ? Special Requests RIGHT  Final  ? Gram Stain   Final  ?  NO SQUAMOUS EPITHELIAL CELLS SEEN ?FEW WBC SEEN ?NO ORGANISMS SEEN ?  ? Culture   Final  ?  NO GROWTH 3 DAYS ?Performed at [facility], [address], [place], [place] [address] ?  ? Report Status 09/01/2021 FINAL  Final  ?Fungus Culture With Stain     Status: None (Preliminary result)  ? Collection Time: 08/29/21  2:54 PM  ? Specimen: Pleural Fluid  ?Result Value Ref Range Status  ? Fungus Stain Final report  Final  ?  Comment: (NOTE) ?Performed At: [facility] ?[address] [place], [place] [id number] ?Jolene Schimke MD [id number] ?  ? Fungus (Mycology) Culture PENDING  Incomplete  ? Fungal Source PLEURAL  Final  ?  Comment: FLUID ?RIGHT ?Performed at [facility], [address] [place], [place] [address] ?  ?Acid Fast Smear (AFB)     Status: None  ?  Collection Time: 08/29/21  2:54 PM  ? Specimen: Pleural, Right; Respiratory  ?Result Value Ref Range Status  ? AFB Specimen Processing Concentration  Final  ? Acid Fast Smear Negative  Final  ?  Comment: (NOTE) ?Performed At: [facility] ?[address] [place], [place] [id number] ?Jolene Schimke MD [id number] ?  ? Source (AFB) PLEURAL  Final  ?  Comment: FLUID ?RIGHT ?Performed at  [facility], [address], [place], [place] [address] ?  ?Fungus Culture Result     Status: None  ? Collection Time: 08/29/21  2:54 PM  ?Result Value Ref Range Status  ? Result 1 Comment  Final  ?  Comment: (NOTE) ?KOH/Calcofluor preparation:  no fungus observed. ?Performed At: [facility] ?[address] [place], [place] [id number] ?Jolene Schimke MD [id number] ?  ?Surgical pcr screen     Status: Abnormal  ? Collection Time: 08/29/21  9:40 PM  ? Specimen: Nasal Mucosa; Nasal Swab  ?Result Value Ref Range Status  ? MRSA, PCR POSITIVE (A) NEGATIVE Final  ?  Comment: RESULT CALLED TO, READ BACK BY AND VERIFIED WITH: ?RN MARISSA B. 08/30/21@0015  BY TW ?  ? Staphylococcus aureus POSITIVE (A) NEGATIVE Final  ?  Comment: (NOTE) ?The Xpert SA Assay (FDA approved for NASAL specimens in patients 26 ?years of age and older), is one component of a comprehensive ?surveillance program. It is not intended to diagnose infection nor to ?guide or monitor treatment. ?Performed at [facility], [address] [place], [place] ?[address] ?  ?Aerobic/Anaerobic Culture w Gram Stain (surgical/deep wound)     Status: None (Preliminary result)  ? Collection Time: 09/02/21  1:11 PM  ? Specimen: Abscess  ?Result Value Ref Range Status  ? Specimen Description ABSCESS  Final  ? Special Requests NONE  Final  ? Gram Stain   Final  ?  MODERATE WBC PRESENT, PREDOMINANTLY PMN ?RARE GRAM POSITIVE COCCI IN CLUSTERS ?Performed at [facility], [address], [place], [place] [address] ?  ? Culture   Final  ?  MODERATE METHICILLIN  RESISTANT STAPHYLOCOCCUS AUREUS ?NO ANAEROBES ISOLATED; CULTURE IN PROGRESS FOR 5 DAYS ?  ? Report Status PENDING  Incomplete  ? Organism ID, Bacteria METHICILLIN RESISTANT STAPHYLOCOCCUS AUREUS  Final  ?    Susce

## 2021-09-06 NOTE — Progress Notes (Signed)
? ?Progress Note ? ?3 Days Post-Op  ?Subjective: ?Pt reports pain currently well controlled. She is tolerating FLD and passing flatus. Feels like she may have a BM today. Denies nausea or worsening pain with PO intake.  ? ?Objective: ?Vital signs in last 24 hours: ?Temp:  [98.4 ?F (36.9 ?C)-98.8 ?F (37.1 ?C)] 98.6 ?F (37 ?C) (04/03 0429) ?Pulse Rate:  [92-101] 92 (04/03 0429) ?Resp:  [18-21] 18 (04/02 1954) ?BP: (102-115)/(68-84) 109/71 (04/03 0429) ?SpO2:  [94 %-100 %] 100 % (04/03 0429) ?Last BM Date : 08/25/21 ? ?Intake/Output from previous day: ?04/02 0701 - 04/03 0700 ?In: 1897.8 [P.O.:480; I.V.:90; IV Piggyback:1327.8] ?Out: 1357 [Urine:1177; Drains:180] ?Intake/Output this shift: ?No intake/output data recorded. ? ?PE: ?General: pleasant, WD, overweight female who is laying in bed in NAD ?Heart: regular, rate, and rhythm.   ?Lungs:  Respiratory effort nonlabored ?Abd: soft, NT, ND, +BS, incision loosely approximated with staples present, drain in LLQ with SS fluid ? ? ?Lab Results:  ?Recent Labs  ?  09/04/21 ?0252 09/05/21 ?0231  ?WBC 14.3* 16.8*  ?HGB 9.3* 8.8*  ?HCT 29.8* 27.2*  ?PLT 628* 573*  ? ?BMET ?Recent Labs  ?  09/04/21 ?0252  ?NA 131*  ?K 4.0  ?CL 103  ?CO2 22  ?GLUCOSE 139*  ?BUN 10  ?CREATININE 0.34*  ?CALCIUM 8.2*  ? ?PT/INR ?No results for input(s): LABPROT, INR in the last 72 hours. ?CMP  ?   ?Component Value Date/Time  ? NA 131 (L) 09/04/2021 0252  ? K 4.0 09/04/2021 0252  ? CL 103 09/04/2021 0252  ? CO2 22 09/04/2021 0252  ? GLUCOSE 139 (H) 09/04/2021 0252  ? BUN 10 09/04/2021 0252  ? CREATININE 0.34 (L) 09/04/2021 0252  ? CALCIUM 8.2 (L) 09/04/2021 0252  ? PROT 6.0 (L) 09/02/2021 0150  ? ALBUMIN 1.9 (L) 09/02/2021 0150  ? AST 24 09/02/2021 0150  ? ALT 40 09/02/2021 0150  ? ALKPHOS 76 09/02/2021 0150  ? BILITOT 0.8 09/02/2021 0150  ? GFRNONAA >60 09/04/2021 0252  ? ?Lipase  ?No results found for: LIPASE ? ? ? ? ?Studies/Results: ?MR LUMBAR SPINE WO CONTRAST ? ?Result Date:  09/05/2021 ?CLINICAL DATA:  Low back pain, infection suspected EXAM: MRI LUMBAR SPINE WITHOUT CONTRAST TECHNIQUE: Multiplanar, multisequence MR imaging of the lumbar spine was performed. No intravenous contrast was administered. COMPARISON:  08/27/2021 MRI thoracic and lumbar spine FINDINGS: Evaluation is somewhat limited by the absence of intravenous contrast. Segmentation: Standard. Alignment:  Physiologic. Vertebrae: Redemonstrated marrow edema at the partially imaged T11-T12 disc space and endplates, as well throughout the L5 vertebral body and proximal sacrum, L5-S1 disc space, and inferior aspect of the L4-L5 disc space. No new areas of cortical erosion or disc edema. Status post interval L4-L5 laminectomy and debridement. Findings concerning for septic arthritis in the bilateral sacroiliac joints, with diffuse edema in the sacrum, extending into the bilateral iliac bones. Conus medullaris and cauda equina: Conus extends to the L1 level. Previously noted dorsal epidural abscess at the level of L4-L5 is significantly decreased in size, status post decompression and debridement. Epidural phlegmon likely extends along the dorsal aspect of the thecal sac (series 2, image 9 and series 5, image 34), causing moderate thecal sac narrowing at S1. Paraspinal and other soft tissues: Increased T2 signal in the operative bed, likely edema. No significant fluid collection is noted within paraspinous musculature in the operative bed. There is a small fluid collection in the subcutaneous fat (series 3, image 9). Multifocal abscesses in the  iliopsoas muscles bilaterally. Disc levels: No significant degenerative changes. IMPRESSION: 1. 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 No new lumbar levels of involvement. 2. Status post L4-L5 laminectomy and debridement, with significant decrease in the amount of  dorsal epidural phlegmon. A small phlegmonous collection is now seen posterior to S1, which causes moderate thecal sac narrowing. 3. Redemonstrated multifocal iliopsoas abscesses and partially imaged findings in the sacrum concerning for sacroiliac septic arthritis, which was better evaluated on the 08/28/2021 MRI pelvis. Electronically Signed   By: Wiliam Ke M.D.   On: 09/05/2021 18:59   ? ?Anti-infectives: ?Anti-infectives (From admission, onward)  ? ? Start     Dose/Rate Route Frequency Ordered Stop  ? 09/03/21 1355  vancomycin (VANCOCIN) powder  Status:  Discontinued       ?   As needed 09/03/21 1355 09/03/21 1426  ? 09/01/21 2200  vancomycin (VANCOREADY) IVPB 1750 mg/350 mL       ? 1,750 mg ?175 mL/hr over 120 Minutes Intravenous Every 12 hours 09/01/21 1058    ? 08/30/21 1000  metroNIDAZOLE (FLAGYL) IVPB 500 mg  Status:  Discontinued       ? 500 mg ?100 mL/hr over 60 Minutes Intravenous Every 12 hours 08/30/21 0036 08/30/21 1708  ? 08/30/21 0600  metroNIDAZOLE (FLAGYL) IVPB 500 mg  Status:  Discontinued       ? 500 mg ?100 mL/hr over 60 Minutes Intravenous On call to O.R. 08/30/21 0220 08/30/21 0229  ? 08/30/21 0245  ciprofloxacin (CIPRO) IVPB 400 mg  Status:  Discontinued       ? 400 mg ?200 mL/hr over 60 Minutes Intravenous Every 12 hours 08/30/21 0037 08/30/21 1708  ? 08/30/21 0045  cefTRIAXone (ROCEPHIN) 2 g in sodium chloride 0.9 % 100 mL IVPB  Status:  Discontinued       ? 2 g ?200 mL/hr over 30 Minutes Intravenous Every 24 hours 08/30/21 0036 08/30/21 0036  ? 08/29/21 2315  metroNIDAZOLE (FLAGYL) IVPB 500 mg       ?Note to Pharmacy: TO MAIN OR STAT!! In surgery now  ? 500 mg ?100 mL/hr over 60 Minutes Intravenous To Surgery 08/29/21 2302 08/29/21 2319  ? 08/29/21 2200  vancomycin (VANCOREADY) IVPB 1250 mg/250 mL  Status:  Discontinued       ? 1,250 mg ?166.7 mL/hr over 90 Minutes Intravenous Every 12 hours 08/29/21 1857 09/01/21 1058  ? 08/29/21 2100  Vancomycin (VANCOCIN) 1,250 mg in sodium  chloride 0.9 % 250 mL IVPB  Status:  Discontinued       ? 1,250 mg ?166.7 mL/hr over 90 Minutes Intravenous Every 12 hours 08/29/21 0837 08/29/21 1858  ? 08/29/21 0930  vancomycin (VANCOCIN) 2,000 mg in sodium chloride 0.9 % 500 mL IVPB       ? 2,000 mg ?260 mL/hr over 120 Minutes Intravenous  Once 08/29/21 0837 08/29/21 1257  ? 08/26/21 2000  cefTRIAXone (ROCEPHIN) 2 g in sodium chloride 0.9 % 100 mL IVPB  Status:  Discontinued       ? 2 g ?200 mL/hr over 30 Minutes Intravenous Every 24 hours 08/26/21 1614 08/28/21 1736  ? 08/26/21 2000  DAPTOmycin (CUBICIN) 700 mg in sodium chloride 0.9 % IVPB  Status:  Discontinued       ? 8 mg/kg ? 87.2 kg ?128 mL/hr over 30 Minutes Intravenous Daily 08/26/21 1819 08/29/21 0842  ? ?  ? ? ? ?  Assessment/Plan ?POD8, S/P Dx laparoscopy, exploratory laparotomy, graham patch repair of gastric perforation 3/26 Dr. Sophronia Simas for perforated gastric ulcer in posterior stomach ?- h.pylori negative ?- adv to soft diet and transition protonix to PO ?- drain is SS - possibly removed tomorrow if SS with solid intake  ?- IS/Pulm toilet, OOB as able ?  ?FEN: soft diet, IVF per TRH, PPI ?ID: per ID, IV Vanc ?VTE: SCD's, Lovenox  ?Foley: out ?  ?MRSA bacteremia ?R shoulder abscess/clavicle abscess - OR 3/31 by Dr. Lajoyce Corners ?B psoas abscess - S/P drainage by IR 3/30 ?Epidural abscess s/p lumbar laminectomy and drainage ?Pleural effusion s/p R pigtail chest tube 3/26 by CCM ?  ?PMH IVDA ?Suspected septic emboli  ? LOS: 11 days  ? ? ?Juliet Rude, PA-C ?[facility] ?09/06/2021, 9:40 AM ?Please see Amion for pager number during day hours 7:00am-4:30pm ? ?

## 2021-09-06 NOTE — Progress Notes (Signed)
Pharmacy Antibiotic Note ? ?Kimberly Booker is a 36 y.o. female admitted on 08/26/2021 with  disseminated MRSA bacteremia (diffuse discitis/osteomyelitis/epidural abscess of cervical, thoracic and lumbar spine, bilateral septic arthritis, osteomyelitis of R clavicle, R lung empyema) .  ? ?Patient was initially started on vancomycin 3/13-3/15 along with ceftriaxone 3/13-3/24. Vancomycin was transitioned to daptomycin and given 3/16-3/25. Pt was transferred to [facility] on 08/26/21. ID has consulted pharmacy dose vancomycin for coverage of MRSA R lung empyema.  ? ?Patient is s/p placement of IR drains into iliopsoas abscesses 3/30 and debridement of right clavicle 3/31. Scr remains stable.  ?Vancomycin Peak 38, Trough 12- AUC at current dose is 602, will adjust dose starting with tonight's dose.  ? ?Plan: ?Adjust to vancomycin 1500 mg IV q12h (eAUC ~515) ?Goal AUC 400-550 ?Would plan repeat levels early next week  ?Monitor renal function and clinical signs of infection  ? ?Height: 5\' 7"  (170.2 cm) ?Weight: 77.1 kg (170 lb) ?IBW/kg (Calculated) : 61.6 ? ?Temp (24hrs), Avg:98.6 ?F (37 ?C), Min:98.1 ?F (36.7 ?C), Max:98.8 ?F (37.1 ?C) ? ?Recent Labs  ?Lab 08/31/21 ?0246 09/01/21 ?0117 09/01/21 ?09/03/21 09/02/21 ?0150 09/03/21 ?09/05/21 09/04/21 ?0252 09/05/21 ?0231 09/06/21 ?0110 09/06/21 ?11/06/21  ?WBC 12.9* 12.9*  --  16.9* 19.2* 14.3* 16.8*  --   --   ?CREATININE 0.39* 0.32*  --  0.31*  --  0.34*  --   --   --   ?VANCOTROUGH  --   --  8*  --   --   --   --   --  12*  ?VANCOPEAK  --  20*  --   --   --   --   --  38  --   ? ?  ?Estimated Creatinine Clearance: 104.1 mL/min (A) (by C-G formula based on SCr of 0.34 mg/dL (L)).   ? ?Allergies  ?Allergen Reactions  ? Amoxicillin Swelling  ?  Throat closes  ? ? ?Antimicrobials this admission: ?Vanc 3/13 >> 3/15 ?Ceftriaxone 3/13 >> 3/24 ?Daptomycin 3/16 >> 3/25 ?Vanc 3/26 >> ? ?Microbiology results: ?3/12 Bcx x2: 4/4 MRSA ?3/12 Chest wound cx: MRSA ?3/14 Bcx: 4/4 MRSA ?3/22 Bcx: ngtd  x3d ?3/24 Bcx: ngtd x2d ?3/25 lumbar epidural wound/abscess: MRSA ?3/31 Cx: NG ? ? ?Thank you for allowing pharmacy to be a part of this patient?s care. ? ?4/31, PharmD ?PGY2 Infectious Diseases Pharmacy Resident ?  ?Please check [email or web address] for unit-specific pharmacy phone numbers  ? ? ?

## 2021-09-06 NOTE — Assessment & Plan Note (Addendum)
Patient with MRSA infection at multiple sites.  Subsequently with gastric perforation.  Pain management has been difficult.  She was subsequently started on a fentanyl patch by orthopedics.  Also on Dilaudid intravenously as needed.  Percocet orally as needed.  Continue bowel regimen. ?

## 2021-09-07 DIAGNOSIS — B9562 Methicillin resistant Staphylococcus aureus infection as the cause of diseases classified elsewhere: Secondary | ICD-10-CM | POA: Diagnosis not present

## 2021-09-07 DIAGNOSIS — R7881 Bacteremia: Secondary | ICD-10-CM | POA: Diagnosis not present

## 2021-09-07 LAB — COMPREHENSIVE METABOLIC PANEL
ALT: 24 U/L (ref 0–44)
AST: 17 U/L (ref 15–41)
Albumin: 2.3 g/dL — ABNORMAL LOW (ref 3.5–5.0)
Alkaline Phosphatase: 73 U/L (ref 38–126)
Anion gap: 7 (ref 5–15)
BUN: 7 mg/dL (ref 6–20)
CO2: 23 mmol/L (ref 22–32)
Calcium: 8.2 mg/dL — ABNORMAL LOW (ref 8.9–10.3)
Chloride: 104 mmol/L (ref 98–111)
Creatinine, Ser: 0.35 mg/dL — ABNORMAL LOW (ref 0.44–1.00)
GFR, Estimated: >60 mL/min (ref >60)
Glucose, Bld: 111 mg/dL — ABNORMAL HIGH (ref 70–99)
Potassium: 3.5 mmol/L (ref 3.5–5.1)
Sodium: 134 mmol/L — ABNORMAL LOW (ref 135–145)
Total Bilirubin: 0.7 mg/dL (ref 0.3–1.2)
Total Protein: 6.4 g/dL — ABNORMAL LOW (ref 6.5–8.1)

## 2021-09-07 LAB — CBC
HCT: 26.2 % — ABNORMAL LOW (ref 36.0–46.0)
Hemoglobin: 8.6 g/dL — ABNORMAL LOW (ref 12.0–15.0)
MCH: 30.7 pg (ref 26.0–34.0)
MCHC: 32.8 g/dL (ref 30.0–36.0)
MCV: 93.6 fL (ref 80.0–100.0)
Platelets: 497 10*3/uL — ABNORMAL HIGH (ref 150–400)
RBC: 2.8 MIL/uL — ABNORMAL LOW (ref 3.87–5.11)
RDW: 14.6 % (ref 11.5–15.5)
WBC: 14.9 10*3/uL — ABNORMAL HIGH (ref 4.0–10.5)
nRBC: 0 % (ref 0.0–0.2)

## 2021-09-07 LAB — AEROBIC/ANAEROBIC CULTURE W GRAM STAIN (SURGICAL/DEEP WOUND)

## 2021-09-07 NOTE — Progress Notes (Signed)
? ? ?[facility] ? ?Date of Admission:  08/26/2021    ? ?Total days of antibiotics 12 ?        ?ASSESSMENT: ? ?Kimberly Booker's abscess culture from 3/30 is growing MRSA and tissue culture from 3/31 is without growth. No clear indication for repeat TEE given suspected source control. Will need 8 weeks of antibiotic therapy with starting date from last surgery on 09/03/21 with tentative end date of 10/29/21. She is not a candidate for at home PICC care and will leave disposition to primary team.  Continue therapeutic monitoring of renal function and vancomycin levels. Post-operative wound care per Orthopedics. Remaining medical and supportive care per primary team.  ? ?ID will follow peripherally.  ? ?PLAN: ? ?Continue vancomycin. ?Therapeutic monitoring of renal function and vancomycin levels.  ?Wound care per Orthopedics.  ?Disposition per primary care (not a candidate for home PICC therapy) ?Remaining medical and supportive care per primary team.  ? ?Principal Problem: ?  MRSA bacteremia ?Active Problems: ?  Pathologic fracture of right clavicle ?  Anxiety with depression ?  Polysubstance abuse ([place]) ?  Transaminitis ?  Normocytic anemia ?  Abscess in epidural space of lumbar spine ?  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  Pleural effusion ?  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  Gastric perforation ([place]) ?  Acute osteomyelitis of clavicle, right ([facility]) ?  Pain management ? ? ? enoxaparin (LOVENOX) injection  40 mg Subcutaneous Q24H  ? fentaNYL  1 patch Transdermal Q72H  ? FLUoxetine  40 mg Oral Daily  ? lidocaine  1 patch Transdermal Q24H  ? mouth rinse  15 mL Mouth Rinse BID  ? nicotine  21 mg Transdermal Daily  ? pantoprazole  40 mg Oral BID  ? polyethylene glycol  17 g Oral Daily  ? senna-docusate  2 tablet Oral BID  ? sodium chloride flush  10 mL Other Q8H  ? ? ?SUBJECTIVE: ? ?Afebrile overnight with no acute events. No new concerns/complaints.  ? ?Allergies  ?Allergen Reactions  ? Amoxicillin  Swelling  ?  Throat closes  ? ? ? ?Review of Systems: ?Review of Systems  ?Constitutional:  Negative for chills, fever and weight loss.  ?Respiratory:  Negative for cough, shortness of breath and wheezing.   ?Cardiovascular:  Negative for chest pain and leg swelling.  ?Gastrointestinal:  Negative for abdominal pain, constipation, diarrhea, nausea and vomiting.  ?Skin:  Negative for rash.  ? ? ? ?OBJECTIVE: ?Vitals:  ? 09/06/21 0829 09/06/21 1625 09/06/21 2142 09/07/21 0815  ?BP: 124/68 112/66 103/72 106/65  ?Pulse: 96 99 99 93  ?Resp: 20 (!) 21 20 20   ?Temp: 98.1 ?F (36.7 ?C) 97.9 ?F (36.6 ?C) 98.4 ?F (36.9 ?C) 98.5 ?F (36.9 ?C)  ?TempSrc: Oral Oral Oral Oral  ?SpO2: 100% 98% 99% 99%  ?Weight:      ?Height:      ? ?Body mass index is 26.63 kg/m?. ? ?Physical Exam ?Constitutional:   ?   General: She is not in acute distress. ?   Appearance: She is well-developed.  ?Cardiovascular:  ?   Rate and Rhythm: Normal rate and regular rhythm.  ?   Heart sounds: Normal heart sounds.  ?Pulmonary:  ?   Effort: Pulmonary effort is normal.  ?   Breath sounds: Normal breath sounds.  ?Skin: ?   General: Skin is warm and dry.  ?Neurological:  ?   Mental Status: She is alert and oriented to person, place, and  time.  ?Psychiatric:     ?   Behavior: Behavior normal.     ?   Thought Content: Thought content normal.     ?   Judgment: Judgment normal.  ? ? ?Lab Results ?Lab Results  ?Component Value Date  ? WBC 14.9 (H) 09/07/2021  ? HGB 8.6 (L) 09/07/2021  ? HCT 26.2 (L) 09/07/2021  ? MCV 93.6 09/07/2021  ? PLT 497 (H) 09/07/2021  ?  ?Lab Results  ?Component Value Date  ? CREATININE 0.35 (L) 09/07/2021  ? BUN 7 09/07/2021  ? NA 134 (L) 09/07/2021  ? K 3.5 09/07/2021  ? CL 104 09/07/2021  ? CO2 23 09/07/2021  ?  ?Lab Results  ?Component Value Date  ? ALT 24 09/07/2021  ? AST 17 09/07/2021  ? ALKPHOS 73 09/07/2021  ? BILITOT 0.7 09/07/2021  ?  ? ?Microbiology: ?Recent Results (from the past 240 hour(s))  ?Aerobic/Anaerobic Culture w Gram  Stain (surgical/deep wound)     Status: None  ? Collection Time: 08/28/21  1:42 PM  ? Specimen: Wound; Abscess  ?Result Value Ref Range Status  ? Specimen Description WOUND  Final  ? Special Requests LUMBAR EPIDURAL ABSCESS SPEC A  Final  ? Gram Stain NO WBC SEEN ?RARE GRAM POSITIVE COCCI ?  Final  ? Culture   Final  ?  FEW METHICILLIN RESISTANT STAPHYLOCOCCUS AUREUS ?NO ANAEROBES ISOLATED ?Performed at [facility], [place] [address], [place], [place] [address] ?  ? Report Status 09/02/2021 FINAL  Final  ? Organism ID, Bacteria METHICILLIN RESISTANT STAPHYLOCOCCUS AUREUS  Final  ?    Susceptibility  ? Methicillin resistant staphylococcus aureus - MIC*  ?  CIPROFLOXACIN >=8 RESISTANT Resistant   ?  ERYTHROMYCIN >=8 RESISTANT Resistant   ?  GENTAMICIN <=0.5 SENSITIVE Sensitive   ?  OXACILLIN >=4 RESISTANT Resistant   ?  TETRACYCLINE <=1 SENSITIVE Sensitive   ?  VANCOMYCIN 1 SENSITIVE Sensitive   ?  TRIMETH/SULFA >=320 RESISTANT Resistant   ?  CLINDAMYCIN >=8 RESISTANT Resistant   ?  RIFAMPIN <=0.5 SENSITIVE Sensitive   ?  Inducible Clindamycin NEGATIVE Sensitive   ?  * FEW METHICILLIN RESISTANT STAPHYLOCOCCUS AUREUS  ?Body fluid culture w Gram Stain     Status: None  ? Collection Time: 08/29/21  2:54 PM  ? Specimen: Pleural Fluid  ?Result Value Ref Range Status  ? Specimen Description PLEURAL FLUID  Final  ? Special Requests RIGHT  Final  ? Gram Stain   Final  ?  NO SQUAMOUS EPITHELIAL CELLS SEEN ?FEW WBC SEEN ?NO ORGANISMS SEEN ?  ? Culture   Final  ?  NO GROWTH 3 DAYS ?Performed at [facility], [place] [address] [place], [place] [address] ?  ? Report Status 09/01/2021 FINAL  Final  ?Fungus Culture With Stain     Status: None (Preliminary result)  ? Collection Time: 08/29/21  2:54 PM  ? Specimen: Pleural Fluid  ?Result Value Ref Range Status  ? Fungus Stain Final report  Final  ?  Comment: (NOTE) ?Performed At: [facility] ?[address] , [place] [id number] ?Rush Farmer MD  [id number] ?  ? Fungus (Mycology) Culture PENDING  Incomplete  ? Fungal Source PLEURAL  Final  ?  Comment: FLUID ?RIGHT ?Performed at [facility], [place] [address], [place], [place] [address] ?  ?Acid Fast Smear (AFB)     Status: None  ? Collection Time: 08/29/21  2:54 PM  ? Specimen: Pleural,  Right; Respiratory  ?Result Value Ref Range Status  ? AFB Specimen Processing Concentration  Final  ? Acid Fast Smear Negative  Final  ?  Comment: (NOTE) ?Performed At: [facility] ?[address] [place], [place] [id number] ?Rush Farmer MD [id number] ?  ? Source (AFB) PLEURAL  Final  ?  Comment: FLUID ?RIGHT ?Performed at [facility], [place] [address] [place], [place] [address] ?  ?Fungus Culture Result     Status: None  ? Collection Time: 08/29/21  2:54 PM  ?Result Value Ref Range Status  ? Result 1 Comment  Final  ?  Comment: (NOTE) ?KOH/Calcofluor preparation:  no fungus observed. ?Performed At: [facility] ?[address] [place], [place] [id number] ?Rush Farmer MD [id number] ?  ?Surgical pcr screen     Status: Abnormal  ? Collection Time: 08/29/21  9:40 PM  ? Specimen: Nasal Mucosa; Nasal Swab  ?Result Value Ref Range Status  ? MRSA, PCR POSITIVE (A) NEGATIVE Final  ?  Comment: RESULT CALLED TO, READ BACK BY AND VERIFIED WITH: ?RN MARISSA B. 08/30/21@0015  BY TW ?  ? Staphylococcus aureus POSITIVE (A) NEGATIVE Final  ?  Comment: (NOTE) ?The Xpert SA Assay (FDA approved for NASAL specimens in patients 72 ?years of age and older), is one component of a comprehensive ?surveillance program. It is not intended to diagnose infection nor to ?guide or monitor treatment. ?Performed at [facility], [place] [address], [place], [place] ?[address] ?  ?Aerobic/Anaerobic Culture w Gram Stain (surgical/deep wound)     Status: None (Preliminary result)  ? Collection Time: 09/02/21  1:11 PM  ? Specimen: Abscess  ?Result Value Ref Range Status  ? Specimen Description ABSCESS  Final  ? Special  Requests NONE  Final  ? Gram Stain   Final  ?  MODERATE WBC PRESENT, PREDOMINANTLY PMN ?RARE GRAM POSITIVE COCCI IN CLUSTERS ?Performed at [facility], [place] [address], [place], [place] [address] ?  ? Culture

## 2021-09-07 NOTE — Progress Notes (Signed)
4 Days Post-Op  ? ?Subjective/Chief Complaint: ?PT SAYS LEGS HURT  ?NO ABDOMINAL PAIN WANTS MORE TO EAT  ? ? ?Objective: ?Vital signs in last 24 hours: ?Temp:  [97.9 ?F (36.6 ?C)-98.5 ?F (36.9 ?C)] 98.5 ?F (36.9 ?C) (04/04 0815) ?Pulse Rate:  [93-99] 93 (04/04 0815) ?Resp:  [20-21] 20 (04/04 0815) ?BP: (103-112)/(65-72) 106/65 (04/04 0815) ?SpO2:  [98 %-99 %] 99 % (04/04 0815) ?Last BM Date : 08/25/21 ? ?Intake/Output from previous day: ?04/03 0701 - 04/04 0700 ?In: 240 [P.O.:240] ?Out: 550 [Urine:325; Drains:225] ?Intake/Output this shift: ?Total I/O ?In: -  ?Out: 350 [Urine:350] ? ? ?General: pleasant, WD, overweight female who is laying in bed in NAD ?Heart: regular, rate, and rhythm.   ?Lungs:  Respiratory effort nonlabored ?Abd: soft, NT, ND, +BS, incision loosely approximated with staples present, drain in LLQ with SS fluid minimal  ?Lab Results:  ?Recent Labs  ?  09/05/21 ?0231 09/07/21 ?0205  ?WBC 16.8* 14.9*  ?HGB 8.8* 8.6*  ?HCT 27.2* 26.2*  ?PLT 573* 497*  ? ?BMET ?Recent Labs  ?  09/07/21 ?0205  ?NA 134*  ?K 3.5  ?CL 104  ?CO2 23  ?GLUCOSE 111*  ?BUN 7  ?CREATININE 0.35*  ?CALCIUM 8.2*  ? ?PT/INR ?No results for input(s): LABPROT, INR in the last 72 hours. ?ABG ?No results for input(s): PHART, HCO3 in the last 72 hours. ? ?Invalid input(s): PCO2, PO2 ? ?Studies/Results: ?MR LUMBAR SPINE WO CONTRAST ? ?Result Date: 09/05/2021 ?CLINICAL DATA:  Low back pain, infection suspected EXAM: MRI LUMBAR SPINE WITHOUT CONTRAST TECHNIQUE: Multiplanar, multisequence MR imaging of the lumbar spine was performed. No intravenous contrast was administered. COMPARISON:  08/27/2021 MRI thoracic and lumbar spine FINDINGS: Evaluation is somewhat limited by the absence of intravenous contrast. Segmentation: Standard. Alignment:  Physiologic. Vertebrae: Redemonstrated marrow edema at the partially imaged T11-T12 disc space and endplates, as well throughout the L5 vertebral body and proximal sacrum, L5-S1 disc space, and inferior  aspect of the L4-L5 disc space. No new areas of cortical erosion or disc edema. Status post interval L4-L5 laminectomy and debridement. Findings concerning for septic arthritis in the bilateral sacroiliac joints, with diffuse edema in the sacrum, extending into the bilateral iliac bones. Conus medullaris and cauda equina: Conus extends to the L1 level. Previously noted dorsal epidural abscess at the level of L4-L5 is significantly decreased in size, status post decompression and debridement. Epidural phlegmon likely extends along the dorsal aspect of the thecal sac (series 2, image 9 and series 5, image 34), causing moderate thecal sac narrowing at S1. Paraspinal and other soft tissues: Increased T2 signal in the operative bed, likely edema. No significant fluid collection is noted within paraspinous musculature in the operative bed. There is a small fluid collection in the subcutaneous fat (series 3, image 9). Multifocal abscesses in the iliopsoas muscles bilaterally. Disc levels: No significant degenerative changes. IMPRESSION: 1. 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 No new lumbar levels of involvement. 2. Status post L4-L5 laminectomy and debridement, with significant decrease in the amount of dorsal epidural phlegmon. A small phlegmonous collection is now seen posterior to S1, which causes moderate thecal sac narrowing. 3. Redemonstrated multifocal iliopsoas abscesses and partially imaged findings in the sacrum concerning for sacroiliac septic arthritis, which was better evaluated on the 08/28/2021 MRI pelvis. Electronically Signed   By: Wiliam KeAlison  Vasan M.D.   On: 09/05/2021 18:59   ? ?  Anti-infectives: ?Anti-infectives (From admission, onward)  ? ? Start     Dose/Rate Route Frequency Ordered Stop  ? 09/06/21 2200  vancomycin (VANCOREADY) IVPB 1500 mg/300 mL       ? 1,500 mg ?150 mL/hr over  120 Minutes Intravenous Every 12 hours 09/06/21 1257    ? 09/03/21 1355  vancomycin (VANCOCIN) powder  Status:  Discontinued       ?   As needed 09/03/21 1355 09/03/21 1426  ? 09/01/21 2200  vancomycin (VANCOREADY) IVPB 1750 mg/350 mL  Status:  Discontinued       ? 1,750 mg ?175 mL/hr over 120 Minutes Intravenous Every 12 hours 09/01/21 1058 09/06/21 1257  ? 08/30/21 1000  metroNIDAZOLE (FLAGYL) IVPB 500 mg  Status:  Discontinued       ? 500 mg ?100 mL/hr over 60 Minutes Intravenous Every 12 hours 08/30/21 0036 08/30/21 1708  ? 08/30/21 0600  metroNIDAZOLE (FLAGYL) IVPB 500 mg  Status:  Discontinued       ? 500 mg ?100 mL/hr over 60 Minutes Intravenous On call to O.R. 08/30/21 0220 08/30/21 0229  ? 08/30/21 0245  ciprofloxacin (CIPRO) IVPB 400 mg  Status:  Discontinued       ? 400 mg ?200 mL/hr over 60 Minutes Intravenous Every 12 hours 08/30/21 0037 08/30/21 1708  ? 08/30/21 0045  cefTRIAXone (ROCEPHIN) 2 g in sodium chloride 0.9 % 100 mL IVPB  Status:  Discontinued       ? 2 g ?200 mL/hr over 30 Minutes Intravenous Every 24 hours 08/30/21 0036 08/30/21 0036  ? 08/29/21 2315  metroNIDAZOLE (FLAGYL) IVPB 500 mg       ?Note to Pharmacy: TO MAIN OR STAT!! In surgery now  ? 500 mg ?100 mL/hr over 60 Minutes Intravenous To Surgery 08/29/21 2302 08/29/21 2319  ? 08/29/21 2200  vancomycin (VANCOREADY) IVPB 1250 mg/250 mL  Status:  Discontinued       ? 1,250 mg ?166.7 mL/hr over 90 Minutes Intravenous Every 12 hours 08/29/21 1857 09/01/21 1058  ? 08/29/21 2100  Vancomycin (VANCOCIN) 1,250 mg in sodium chloride 0.9 % 250 mL IVPB  Status:  Discontinued       ? 1,250 mg ?166.7 mL/hr over 90 Minutes Intravenous Every 12 hours 08/29/21 0837 08/29/21 1858  ? 08/29/21 0930  vancomycin (VANCOCIN) 2,000 mg in sodium chloride 0.9 % 500 mL IVPB       ? 2,000 mg ?260 mL/hr over 120 Minutes Intravenous  Once 08/29/21 0837 08/29/21 1257  ? 08/26/21 2000  cefTRIAXone (ROCEPHIN) 2 g in sodium chloride 0.9 % 100 mL IVPB  Status:   Discontinued       ? 2 g ?200 mL/hr over 30 Minutes Intravenous Every 24 hours 08/26/21 1614 08/28/21 1736  ? 08/26/21 2000  DAPTOmycin (CUBICIN) 700 mg in sodium chloride 0.9 % IVPB  Status:  Discontinued       ? 8 mg/kg ? 87.2 kg ?128 mL/hr over 30 Minutes Intravenous Daily 08/26/21 1819 08/29/21 0842  ? ?  ? ? ?Assessment/Plan: ?POD9, S/P Dx laparoscopy, exploratory laparotomy, graham patch repair of gastric perforation 3/26 Dr. Sophronia Simas for perforated gastric ulcer in posterior stomach ?- h.pylori negative ?- adv to soft diet and transition protonix to PO ?- drain is SS - remove if she eats better tomorrow  ?- IS/Pulm toilet, OOB as able ?  ?FEN: soft diet, IVF per [facility], PPI ?ID: per ID, IV Vanc ?VTE: SCD's, Lovenox  ?Foley: out ?  ?MRSA bacteremia ?R  shoulder abscess/clavicle abscess - OR 3/31 by Dr. Lajoyce Corners ?B psoas abscess - S/P drainage by IR 3/30 ?Epidural abscess s/p lumbar laminectomy and drainage ?Pleural effusion s/p R pigtail chest tube 3/26 by CCM ?  ?PMH IVDA ?Suspected septic emboli  ? ? LOS: 12 days  ? ? ?Shoshanah Dapper A Kandis Henry md  ?09/07/2021 ? ?

## 2021-09-07 NOTE — Progress Notes (Signed)
Physical Therapy Treatment ?Patient Details ?Name: Kimberly Booker ?MRN: [id number] ?DOB: 01-Aug-1985 ?Today's Date: 09/07/2021 ? ? ?History of Present Illness Pt is a 36 y.o. female initially admitted to [facility] 08/15/21 with R shoulder abscess; admit to [facility] 08/26/21 with MRSA bacteremia. 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 S/p L4-5 laminectomy and abscess debridement 3/25. Pt with pleural effusion s/p R pigtail chest tube 3/26. Abdominal CT showed bowel perforation; s/p ex lap with patch repair on 3/27. S/p R psoas abscess drainage on 3/30. S/p R clavicle I&D with wound vac placement 3/31. Pt developed severe LBP on 4/2; MRI showed improvement in abscess. PMH includes anxiety, depression, polysubstance abuse, R clavicle fx ~8 mo prior to admission. ?  ?PT Comments  ? ? Pt progressing with mobility. Today's session focused on transfer and gait training with RW; pt requiring intermittent minA for standing and balance. Pt pleasant and agreeable to participate despite c/o significant pain. Pt remains limited by generalized weakness, decreased activity tolerance, impaired balance strategies and pain. Noted CIR AC's note signing off due to IV abx. Continue to recommend post-acute rehab to maximize functional mobility and independence prior to return home. ?   ?Recommendations for follow up therapy are one component of a multi-disciplinary discharge planning process, led by the attending physician.  Recommendations may be updated based on patient status, additional functional criteria and insurance authorization. ? ?Follow Up Recommendations ? Acute inpatient rehab (3hours/day) ?  ?  ?Assistance Recommended at Discharge Frequent or constant Supervision/Assistance  ?Patient can return home with the following A little help with walking and/or transfers;A lot of help with bathing/dressing/bathroom;Assistance with cooking/housework;Assist for transportation;Help with stairs or ramp for entrance ?   ?Equipment Recommendations ?  (TBD)  ?  ?Recommendations for Other Services   ? ? ?  ?Precautions / Restrictions Precautions ?Precautions: Fall;Back;Other (comment) ?Precaution Comments: Abdominal JP drain, R clavicle wound vac ?Restrictions ?Weight Bearing Restrictions: Yes ?RUE Weight Bearing: Weight bearing as tolerated  ?  ? ?Mobility ? Bed Mobility ?Overal bed mobility: Needs Assistance ?Bed Mobility: Rolling, Sidelying to Sit ?Rolling: Supervision ?Sidelying to sit: Supervision ?  ?  ?  ?  ?  ? ?Transfers ?Overall transfer level: Needs assistance ?Equipment used: Rolling walker (2 wheels) ?Transfers: Sit to/from Stand ?Sit to Stand: Min assist ?  ?  ?  ?  ?  ?General transfer comment: multiple attempts to stand, initial minA for trunk elevation from EOB to RW, cues for hand placement; additional 2x sit<>stands from recliner with min guard, pt requiring increased time and effort with attempts at various hand placements ?  ? ?Ambulation/Gait ?Ambulation/Gait assistance: Min assist ?Gait Distance (Feet): 30 Feet ?  ?  ?Gait velocity: Decreased ?  ?  ?General Gait Details: initial pivotal steps to recliner, seated rest; ambulating additional 12' + 18' with seated rest break between; slow, antalgic gait with intermittent minA for stability and RW management; assist for line management; pt reports limited by pain ? ? ?Stairs ?  ?  ?  ?  ?  ? ? ?Wheelchair Mobility ?  ? ?Modified Rankin (Stroke Patients Only) ?  ? ? ?  ?Balance Overall balance assessment: Needs assistance ?Sitting-balance support: Feet supported ?Sitting balance-Leahy Scale: Fair ?  ?  ?Standing balance support: Single extremity supported, Bilateral upper extremity supported, During functional activity ?Standing balance-Leahy Scale: Poor ?Standing balance comment: reliant on single UE support for ADL tasks at sink ?  ?  ?  ?  ?  ?  ?  ?  ?  ?  ?  ?  ? ?  ?  Cognition Arousal/Alertness: Awake/alert ?Behavior During Therapy: [facility] for tasks  assessed/performed, Anxious ?Overall Cognitive Status: Within Functional Limits for tasks assessed ?  ?  ?  ?  ?  ?  ?  ?  ?  ?  ?  ?  ?  ?  ?  ?  ?  ?  ?  ? ?  ?Exercises   ? ?  ?General Comments General comments (skin integrity, edema, etc.): HR 96 sitting post-mobility, SpO2 100% on RA ?  ?  ? ?Pertinent Vitals/Pain Pain Assessment ?Pain Assessment: Faces ?Faces Pain Scale: Hurts even more ?Pain Location: back > LE, shoulder, abdomen ?Pain Descriptors / Indicators: Aching, Guarding, Grimacing ?Pain Intervention(s): Monitored during session, Limited activity within patient's tolerance, Patient requesting pain meds-RN notified  ? ? ?Home Living   ?  ?  ?  ?  ?  ?  ?  ?  ?  ?   ?  ?Prior Function    ?  ?  ?   ? ?PT Goals (current goals can now be found in the care plan section) Progress towards PT goals: Progressing toward goals ? ?  ?Frequency ? ? ? Min 3X/week ? ? ? ?  ?PT Plan Current plan remains appropriate  ? ? ?Co-evaluation   ?  ?  ?  ?  ? ?  ?AM-PAC PT "6 Clicks" Mobility   ?Outcome Measure ? Help needed turning from your back to your side while in a flat bed without using bedrails?: A Little ?Help needed moving from lying on your back to sitting on the side of a flat bed without using bedrails?: A Little ?Help needed moving to and from a bed to a chair (including a wheelchair)?: A Little ?Help needed standing up from a chair using your arms (e.g., wheelchair or bedside chair)?: A Little ?Help needed to walk in hospital room?: Total ?Help needed climbing 3-5 steps with a railing? : Total ?6 Click Score: 14 ? ?  ?End of Session Equipment Utilized During Treatment: Gait belt ?Activity Tolerance: Patient tolerated treatment well;Patient limited by pain ?Patient left: in chair;with call bell/phone within reach;with chair alarm set ?Nurse Communication: Mobility status ?PT Visit Diagnosis: Other abnormalities of gait and mobility (R26.89);Muscle weakness (generalized) (M62.81);Pain ?  ? ? ?Time: 4098-1191 ?PT  Time Calculation (min) (ACUTE ONLY): 30 min ? ?Charges:  $Gait Training: 8-22 mins ?$Therapeutic Activity: 8-22 mins          ?          ? ?Ina Homes, PT, DPT ?Acute Rehabilitation Services  ?Pager [phone] ?Office [phone] ? ?Malachy Chamber ?09/07/2021, 5:34 PM ? ?

## 2021-09-07 NOTE — Progress Notes (Signed)
? ?[facility] ?PROGRESS NOTE ? ? ?Kimberly Booker NLG:[id number] DOB: [birth year] DOA: 08/26/2021  12 ?DOS: the patient was seen and examined on 09/07/2021 ? ?PCP: Pcp, No ? ?Brief History and Hospital Course:  ?36 year old with past medical history significant for anxiety, depression, suicidal attempt drug overdose, polysubstance abuse, right clavicle fracture, was admitted at [facility] on 08/15/2021 due to right shoulder abscess.She was treated with I&D in the emergency department followed by IV antibiotics therapy as an inpatient.  She was diagnosed with MRSA bacteremia, endocarditis was ruled out with a TEE performed 3/16, but she continued to have positive blood cultures despite treatment with ceftriaxone and vancomycin.  Antibiotic therapy was changed to daptomycin.  She was then transferred to [facility]. ?Extensive imagings have been done here which showed right clavicle osteomyelitis/phlegmon/abscess, widespread cervical, thoracic, lumbar discitis/osteomyelitis/epidural abscess/cord impingement.  MRI also showed bilateral iliopsoas abscess, right complex pleural effusion.  ID, orthopedics, neurosurgery following. ?Underwent lumbar laminectomy/debridement of abscess, L4-L5 on 3/25. Hospital course also remarkable for finding of right-sided empyema, gastric perforation. Status post emergent laparotomy and perforation of repair by general surgery.  PCCM was also following for chest tube on the right side, which was removed on 3/29. ?Underwent aspiration of the psoas abscesses on 3/30.  ?Underwent debridement of the right clavicle and application of wound VAC to the right shoulder area on 3/31. ?Developed severe lower back pain on 4/2.  MRI was done which showed improvement in the abscess.  Symptoms have improved. ? ? ?Consultants: Infectious disease, neurosurgery, orthopedics, interventional radiology.  General surgery ? ? ?Procedures:   ? ?3/31: DEBRIDEMENT RIGHT CLAVICLE, APPLICATION OF WOUND  VAC ? ?3/30: Aspiration of bilateral psoas abscesses by interventional radiology ? ?3/27: Diagnostic laparoscopy, exploratory laparotomy, Cheree Ditto patch repair of gastric perforation ? ?3/25: L4-L5 laminectomy, debridement of epidural abscess ? ? ?Subjective: ?Patient mentions that her back pain is improved.  Denies any other complaints.  Tolerating her diet.   ? ? ?Assessment/Plan: ? ? ?* MRSA bacteremia ?Patient has disseminated MRSA bacteremia.  ?She had TEE outside facility on 3/16, negative for endocarditis as per the report  ?TTE did not show any vegetation, might need TEE here but was delayed due emergent surgery for gastric perforation and other surgical procedures as outlined.  Will defer to ID. ?Patient is on vancomycin.  WBC has been fluctuating.  She remains afebrile.  Last blood cultures from 3/24 negative.  ID continues to follow.  ? ?Gastric perforation (HCC) ?Complained of abdominal pain on 3/26.  Chest x-ray done after chest tube placement showed incidental finding of pneumoperitoneum.  CT abdomen/pelvis confirmed pneumoperitoneum.  General surgery consulted and she underwent emergent exploratory laparatomy with finding of gastric perforation.  ?General surgery continues to follow.  Diet being advanced gradually.  Possible removal of drain today. ? ?Abscess in epidural space of cervical spine ?Discitis/osteomyelitis at C2-3, C5-6, and C6-7. Ventral epidural abscess at C2-3, C5, and C6 with cord impingement especially at the lower 2 levels. Facet arthritis on the left at C7-T1 and right at C3-4. ?-Also showed discitis/osteomyelitis of T9- T10, T11-T12 ?-Neurosurger seen by neurosurgery previously.  No focal neurological deficits noted  ? 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 Bilateral facet arthritis at L5-S1. Dorsal epidural abscess at L4 and L5,highly compressive on the thecal sac. ?S/P  lumbar laminectomy,  debridement of abscess, L4-L5 on 3/25. ?No further interventions planned by neurosurgery.   ?On 4/2 patient was complaining  of severe pain in the lower back radiating down to her legs.  MRI lumbar spine was ordered. ?It redemonstrated the osteomyelitis at T11-12 and L5-S1.  Noted to be status post L4-L5 laminectomy and debridement with significant decrease in the amount of the abscess.  A small collection was noted posterior to the S1 causing moderate thecal sac narrowing.  Other findings of iliopsoas abscess and sacroiliac septic arthritis were noted as seen previously on MRI pelvis. ?These findings were discussed with neurosurgery.  Patient does not have any focal neurological deficits.  No intervention was recommended. ?Symptoms appear to have improved.  Reason for her symptoms are not entirely clear.  Continue to monitor.  No focal neurological deficits noted.  Patient reassured. ? ?Iliopsoas abscess (HCC) ?Sacroiliac septic arthritis/osteomyelitis ? ?Pelvic MRI showed bilateral infectious sacroiliitis and osteomyelitis of the sacrum and periarticular right iliac bone. Bilateral psoas abscesses, Small bilateral paraspinal muscle abscesses, intramuscular abscess within the left piriformis muscle, small intramuscular abscess within the left gluteus medius muscle. ?Seen by interventional radiology and underwent aspiration on 3/30.  MRSA noted on cultures.   ? ?Pleural effusion ?Chest CT showed a large loculated right pleural effusion concerning for empyema, subcentimeter groundglass nodularity within the bilateral lungs concerning for septic emboli.  ?S/P chest tube placement.  Fluid noted to be exudative.  Cultures without any growth.  No need for DNase/tPA.   ?Chest x-ray showed resolution of the effusion.  Chest tube was discontinued on 3/29.  Respiratory status is stable. ? ?Normocytic anemia ?Hemoglobin low but stable.  ?Received 2 units PRBC at outside facility.  ? ?Anxiety with depression ?Noted to be on  Prozac and Ativan as needed. ? ?Acute osteomyelitis of clavicle, right (HCC) ?MRI of the shoulder showed osteomyelitis, phlegmon, possible abscess.  Orthopedics following.  Underwent debridement of right clavicle and application of wound VAC on 3/31.  Cultures pending. ? ? ?Polysubstance abuse (HCC) ?She declined IV drug abuse recently, last drug use was 2 years ago as per patient. ? ?Pain management ?Patient with MRSA infection at multiple sites.  Subsequently with gastric perforation.  Pain management has been difficult.  She was subsequently started on a fentanyl patch by orthopedics.  Also on Dilaudid intravenously as needed.  Percocet orally as needed.  Continue bowel regimen. ? ?Transaminitis ?HCV antibody positive ?HCV quantitative was not detected. ? ?LFTs noted to be normal on 4/4. ? ? ? ? ? ?DVT Prophylaxis: Lovenox ?Code Status: Full code ?Family Communication: Discussed with patient ?Disposition Plan: Not a candidate for home IV antibiotic therapy due to history of polysubstance abuse. ? ?Status is: Inpatient ?Remains inpatient appropriate because: IV antibiotics.  Need for surgical interventions ? ? ? ? ?Medications: Scheduled: ? enoxaparin (LOVENOX) injection  40 mg Subcutaneous Q24H  ? fentaNYL  1 patch Transdermal Q72H  ? FLUoxetine  40 mg Oral Daily  ? lidocaine  1 patch Transdermal Q24H  ? mouth rinse  15 mL Mouth Rinse BID  ? nicotine  21 mg Transdermal Daily  ? pantoprazole  40 mg Oral BID  ? polyethylene glycol  17 g Oral Daily  ? senna-docusate  2 tablet Oral BID  ? sodium chloride flush  10 mL Other Q8H  ? ?Continuous: ? sodium chloride 10 mL/hr at 09/06/21 0000  ? methocarbamol (ROBAXIN) IV 1,000 mg (09/07/21 0544)  ? methocarbamol (ROBAXIN) IV    ? vancomycin 1,500 mg (09/07/21 0909)  ? ?EXN:[id number], HYDROmorphone (DILAUDID) injection, lip balm, LORazepam, methocarbamol **OR** methocarbamol (ROBAXIN) IV, metoCLOPramide **OR** metoCLOPramide (  REGLAN) injection, ondansetron **OR**  ondansetron (ZOFRAN) IV, oxyCODONE-acetaminophen, phenol ? ?Antibiotics: ?Anti-infectives (From admission, onward)  ? ? Start     Dose/Rate Route Frequency Ordered Stop  ? 09/06/21 2200  vancomycin (VANCOREADY) IVPB 1500

## 2021-09-08 LAB — CBC
HCT: 26.2 % — ABNORMAL LOW (ref 36.0–46.0)
Hemoglobin: 8.3 g/dL — ABNORMAL LOW (ref 12.0–15.0)
MCH: 30 pg (ref 26.0–34.0)
MCHC: 31.7 g/dL (ref 30.0–36.0)
MCV: 94.6 fL (ref 80.0–100.0)
Platelets: 480 10*3/uL — ABNORMAL HIGH (ref 150–400)
RBC: 2.77 MIL/uL — ABNORMAL LOW (ref 3.87–5.11)
RDW: 14.6 % (ref 11.5–15.5)
WBC: 14.6 10*3/uL — ABNORMAL HIGH (ref 4.0–10.5)
nRBC: 0 % (ref 0.0–0.2)

## 2021-09-08 LAB — AEROBIC/ANAEROBIC CULTURE W GRAM STAIN (SURGICAL/DEEP WOUND): Culture: NO GROWTH

## 2021-09-08 MED ORDER — BACITRACIN ZINC 500 UNIT/GM EX OINT
TOPICAL_OINTMENT | Freq: Two times a day (BID) | CUTANEOUS | Status: DC
  Administered 2021-09-08 – 2021-10-27 (×68): 31.5 via TOPICAL
  Filled 2021-09-08 (×4): qty 28.4

## 2021-09-08 NOTE — Plan of Care (Signed)

## 2021-09-08 NOTE — [place] FL2 (Signed)
?[place] MEDICAID FL2 LEVEL OF CARE SCREENING TOOL  ?  ? ?IDENTIFICATION  ?Patient Name: ?Kimberly Booker Birthdate: September 08, 1985 Sex: female Admission Date (Current Location): ?08/26/2021  ?[place] Number: ? [place] ?  Facility and Address:  ?[facility], [address], [place], [place] [address] ?     Provider Number: ?[id number]  ?Attending Physician Name and Address:  ?Kathlen Mody, MD ? Relative Name and Phone Number:  ?  ?   ?Current Level of Care: ?Hospital Recommended Level of Care: ?Skilled Nursing Facility Prior Approval Number: ?  ? ?Date Approved/Denied: ?  PASRR Number: ?  ? ?Discharge Plan: ?SNF ?  ? ?Current Diagnoses: ?Patient Active Problem List  ? Diagnosis Date Noted  ? Pain management 09/06/2021  ? Acute osteomyelitis of clavicle, right (HCC)   ? Gastric perforation (HCC) 08/30/2021  ? Abscess in epidural space of lumbar spine 08/27/2021  ? Abscess in epidural space of cervical spine 08/27/2021  ? Pleural effusion 08/27/2021  ? 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 MRSA bacteremia 08/26/2021  ? Pathologic fracture of right clavicle 08/26/2021  ? Anxiety with depression 08/26/2021  ? Polysubstance abuse (HCC) 08/26/2021  ? Suicide attempt by drug overdose (HCC) 08/26/2021  ? Transaminitis 08/26/2021  ? Normocytic anemia 08/26/2021  ? ? ?Orientation RESPIRATION BLADDER Height & Weight   ?  ?Self, Time, Situation, Place ? Normal Continent Weight: 170 lb (77.1 kg) ?Height:  5\' 7"  (170.2 cm)  ?BEHAVIORAL SYMPTOMS/MOOD NEUROLOGICAL BOWEL NUTRITION STATUS  ?    Continent Diet (Normal)  ?AMBULATORY STATUS COMMUNICATION OF NEEDS Skin   ?Limited Assist Verbally Surgical wounds, Normal (Old abdominal incision) ?  ?  ?  ?    ?     ?     ? ? ?Personal Care Assistance Level of Assistance  ?Bathing, Dressing, Feeding Bathing Assistance: Independent ?Feeding assistance: Independent ?Dressing Assistance: Limited assistance ?   ? ?Functional Limitations Info  ?Sight, Hearing,  Speech Sight Info: Adequate ?Hearing Info: Adequate ?Speech Info: Adequate  ? ? ?SPECIAL CARE FACTORS FREQUENCY  ?PT (By licensed PT), OT (By licensed OT)   ?  ?PT Frequency: 5x weekly ?OT Frequency: 5x weekly ?  ?  ?  ?   ? ? ?Contractures Contractures Info: Not present  ? ? ?Additional Factors Info  ?Code Status, Allergies Code Status Info: Full Code ?Allergies Info: Amoxicillin ?  ?  ?  ?   ? ?Current Medications (09/08/2021):  This is the current hospital active medication list ?Current Facility-Administered Medications  ?Medication Dose Route Frequency Provider Last Rate Last Admin  ? 0.9 %  sodium chloride infusion   Intravenous Continuous 11/08/2021, MD 10 mL/hr at 09/06/21 0000 Infusion Verify at 09/06/21 0000  ? bacitracin ointment   Topical BID 11/06/21, PA-C      ? bisacodyl (DULCOLAX) suppository 10 mg  10 mg Rectal Daily PRN Juliet Rude, MD      ? enoxaparin (LOVENOX) injection 40 mg  40 mg Subcutaneous Q24H [name], MD   40 mg at 09/08/21 11/08/21  ? fentaNYL (DURAGESIC) 25 MCG/HR 1 patch  1 patch Transdermal Q72H Paytes, Austin A, RPH   1 patch at 09/07/21 1512  ? FLUoxetine (PROZAC) capsule 40 mg  40 mg Oral Daily 11/07/21, MD   40 mg at 09/08/21 11/08/21  ? HYDROmorphone (DILAUDID) injection 1 mg  1 mg Intravenous Q2H PRN 9379, MD   1 mg  at 09/08/21 0336  ? lidocaine (LIDODERM) 5 % 1 patch  1 patch Transdermal Q24H [name], MD   1 patch at 09/07/21 2125  ? lip balm (CARMEX) ointment 1 application.  1 application. Topical PRN Osvaldo Shipper, MD      ? LORazepam (ATIVAN) injection 0.5 mg  0.5 mg Intravenous Q6H PRN [name], MD   0.5 mg at 09/05/21 1740  ? MEDLINE mouth rinse  15 mL Mouth Rinse BID [name], MD   15 mL at 09/08/21 0931  ? methocarbamol (ROBAXIN) 1,000 mg in dextrose 5 % 100 mL IVPB  1,000 mg Intravenous Q8H [name], MD 200 mL/hr at 09/07/21 2132 1,000 mg at 09/07/21 2132  ? methocarbamol (ROBAXIN) tablet 500 mg  500 mg Oral Q6H  PRN [name], MD   500 mg at 09/08/21 9211  ? Or  ? methocarbamol (ROBAXIN) 500 mg in dextrose 5 % 50 mL IVPB  500 mg Intravenous Q6H PRN [name], MD      ? metoCLOPramide (REGLAN) tablet 5-10 mg  5-10 mg Oral Q8H PRN [name], MD      ? Or  ? metoCLOPramide (REGLAN) injection 5-10 mg  5-10 mg Intravenous Q8H PRN [name], MD      ? nicotine (NICODERM CQ - dosed in mg/24 hours) patch 21 mg  21 mg Transdermal Daily [name], MD   21 mg at 09/08/21 0932  ? ondansetron (ZOFRAN) tablet 4 mg  4 mg Oral Q6H PRN [name], MD      ? Or  ? ondansetron [facility]) injection 4 mg  4 mg Intravenous Q6H PRN [name], MD   4 mg at 09/03/21 2008  ? oxyCODONE-acetaminophen (PERCOCET) 7.5-325 MG per tablet 1 tablet  1 tablet Oral Q4H PRN Osvaldo Shipper, MD   1 tablet at 09/08/21 9417  ? pantoprazole (PROTONIX) EC tablet 40 mg  40 mg Oral BID Juliet Rude, PA-C   40 mg at 09/08/21 0932  ? phenol (CHLORASEPTIC) mouth spray 1 spray  1 spray Mouth/Throat PRN [name], MD      ? polyethylene glycol (MIRALAX / GLYCOLAX) packet 17 g  17 g Oral Daily Osvaldo Shipper, MD   17 g at 09/08/21 4081  ? senna-docusate (Senokot-S) tablet 2 tablet  2 tablet Oral BID Osvaldo Shipper, MD   2 tablet at 09/08/21 4481  ? sodium chloride flush (NS) 0.9 % injection 10 mL  10 mL Other Q8H [name], MD   10 mL at 09/08/21 0933  ? vancomycin (VANCOREADY) IVPB 1500 mg/300 mL  1,500 mg Intravenous Q12H Reuel Derby, RPH 150 mL/hr at 09/08/21 0937 1,500 mg at 09/08/21 8563  ? ? ? ?Discharge Medications: ?Please see discharge summary for a list of discharge medications. ? ?Relevant Imaging Results: ? ?Relevant Lab Results: ? ? ?Additional Information ?SSN: [id number] ? ?Inis Sizer, LCSW ? ? ? ? ?

## 2021-09-08 NOTE — Progress Notes (Signed)
CSW sent patient's clinicals to facilities in [place] for review. ? ?Edwin Dada, MSW, LCSW ?Transitions of Care  Clinical Social Worker II ?[phone] ? ?

## 2021-09-08 NOTE — Progress Notes (Addendum)
Occupational Therapy Treatment ?Patient Details ?Name: Kimberly Booker ?MRN: [id number] ?DOB: 1986-04-12 ?Today's Date: 09/08/2021 ? ? ?History of present illness Pt is a 36 y.o. female initially admitted to [facility] 08/15/21 with R shoulder abscess; admit to [facility] 08/26/21 with MRSA bacteremia. 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 S/p L4-5 laminectomy and abscess debridement 3/25. Pt with pleural effusion s/p R pigtail chest tube 3/26. Abdominal CT showed bowel perforation; s/p ex lap with patch repair on 3/27. S/p R psoas abscess drainage on 3/30. S/p R clavicle I&D with wound vac placement 3/31. Pt developed severe LBP on 4/2; MRI showed improvement in abscess. PMH includes anxiety, depression, polysubstance abuse, R clavicle fx ~8 mo prior to admission. ?  ?OT comments ? Pt progressing towards goals, able to complete ADLs with supervision - min A, pt initially wanting to complete ADLs in standing at sink, however with mild LOB episode x1 able to self correct. Pt HR in 120's with standing ADL, completed rest of ADLs in sitting at sink. Pt supervision for bed mobility, min guard for transfers. Prefers elevated surface, however is able to stand from non-elevated surfaces min guard as well. Pt adhering well to back precautions throughout session, educated on figure 4 technique for donning/doffing socks, pt reports this causes low back pain, will consider AE for next session. Pt presenting with impairments listed below, will follow acutely. Continue to recommend AIR at d/c.  ? ?Recommendations for follow up therapy are one component of a multi-disciplinary discharge planning process, led by the attending physician.  Recommendations may be updated based on patient status, additional functional criteria and insurance authorization. ?   ?Follow Up Recommendations ? Acute inpatient rehab (3hours/day)  ?  ?Assistance Recommended at Discharge Intermittent Supervision/Assistance  ?Patient can return  home with the following ? Assist for transportation;Help with stairs or ramp for entrance;A little help with walking and/or transfers;A little help with bathing/dressing/bathroom ?  ?Equipment Recommendations ? Tub/shower seat;BSC/3in1  ?  ?Recommendations for Other Services Rehab consult ? ?  ?Precautions / Restrictions Precautions ?Precautions: Fall;Back;Other (comment) ?Precaution Comments: Abdominal JP drain, R clavicle wound vac ?Restrictions ?Weight Bearing Restrictions: Yes ?RUE Weight Bearing: Weight bearing as tolerated  ? ? ?  ? ?Mobility Bed Mobility ?Overal bed mobility: Needs Assistance ?  ?  ?Sidelying to sit: Supervision ?  ?  ?Sit to sidelying: Supervision ?General bed mobility comments: increased time ?  ? ?Transfers ?Overall transfer level: Needs assistance ?Equipment used: Rolling walker (2 wheels) ?Transfers: Sit to/from Stand ?Sit to Stand: Min guard ?  ?  ?  ?  ?  ?General transfer comment: from elevated/non-elevated surfaces ?  ?  ?Balance Overall balance assessment: Needs assistance ?Sitting-balance support: Feet supported ?Sitting balance-Leahy Scale: Good ?  ?  ?Standing balance support: Single extremity supported, Bilateral upper extremity supported, During functional activity ?Standing balance-Leahy Scale: Poor ?Standing balance comment: reliant on single UE support for ADL tasks at sink ?  ?  ?  ?  ?  ?  ?  ?  ?  ?  ?  ?   ? ?ADL either performed or assessed with clinical judgement  ? ?ADL Overall ADL's : Needs assistance/impaired ?  ?  ?Grooming: Oral care;Wash/dry face;Standing ?Grooming Details (indicate cue type and reason): completed standing at sink ?Upper Body Bathing: Minimal assistance;Sitting ?Upper Body Bathing Details (indicate cue type and reason): to wash back ?Lower Body Bathing: Minimal assistance;Sitting/lateral leans ?  ?Upper Body Dressing : Minimal assistance;Sitting ?  ?  ?  ?  Toilet Transfer: Supervision/safety;Rolling walker (2 wheels);Comfort height  toilet;Ambulation ?  ?Toileting- Clothing Manipulation and Hygiene: Supervision/safety;Sitting/lateral lean ?Toileting - Clothing Manipulation Details (indicate cue type and reason): for pericare ?  ?  ?Functional mobility during ADLs: Min guard;Rolling walker (2 wheels) ?  ?  ? ?Extremity/Trunk Assessment Upper Extremity Assessment ?Upper Extremity Assessment: Generalized weakness ?  ?Lower Extremity Assessment ?Lower Extremity Assessment: Defer to PT evaluation ?  ?  ?  ? ?Vision   ?Vision Assessment?: No apparent visual deficits ?  ?Perception Perception ?Perception: Not tested ?  ?Praxis Praxis ?Praxis: Not tested ?  ? ?Cognition Arousal/Alertness: Awake/alert ?Behavior During Therapy: [facility] for tasks assessed/performed, Anxious ?Overall Cognitive Status: Within Functional Limits for tasks assessed ?  ?  ?  ?  ?  ?  ?  ?  ?  ?  ?  ?  ?  ?  ?  ?  ?  ?  ?  ?   ?Exercises   ? ?  ?Shoulder Instructions   ? ? ?  ?General Comments HR 120's with standing grooming task, pt with x1 LOB episode, able to self correct, able to continue ADLs seated at sink  ? ? ?Pertinent Vitals/ Pain       Pain Assessment ?Pain Assessment: Faces ?Pain Score: 3  ?Faces Pain Scale: Hurts a little bit ?Pain Location: back > LE, shoulder, abdomen ?Pain Descriptors / Indicators: Aching, Guarding, Grimacing ?Pain Intervention(s): Limited activity within patient's tolerance, Monitored during session, Repositioned, Premedicated before session ? ?Home Living   ?  ?  ?  ?  ?  ?  ?  ?  ?  ?  ?  ?  ?  ?  ?  ?  ?  ?  ? ?  ?Prior Functioning/Environment    ?  ?  ?  ?   ? ?Frequency ? Min 3X/week  ? ? ? ? ?  ?Progress Toward Goals ? ?OT Goals(current goals can now be found in the care plan section) ? Progress towards OT goals: Progressing toward goals ? ?Acute Rehab OT Goals ?Patient Stated Goal: to get better ?OT Goal Formulation: With patient ?Time For Goal Achievement: 09/15/21 ?Potential to Achieve Goals: Good ?ADL Goals ?Pt Will Perform Upper Body  Dressing: with min guard assist;sitting;bed level ?Pt Will Perform Lower Body Dressing: with min guard assist;sitting/lateral leans;bed level ?Pt Will Transfer to Toilet: with mod assist;stand pivot transfer;bedside commode ?Pt Will Perform Tub/Shower Transfer: with min assist;ambulating;Shower transfer;Tub transfer;shower seat  ?Plan Discharge plan remains appropriate;Frequency remains appropriate   ? ?Co-evaluation ? ? ?   ?  ?  ?  ?  ? ?  ?AM-PAC OT "6 Clicks" Daily Activity     ?Outcome Measure ? ? Help from another person eating meals?: None ?Help from another person taking care of personal grooming?: None ?Help from another person toileting, which includes using toliet, bedpan, or urinal?: A Little ?Help from another person bathing (including washing, rinsing, drying)?: A Little ?Help from another person to put on and taking off regular upper body clothing?: A Lot ?Help from another person to put on and taking off regular lower body clothing?: A Lot ?6 Click Score: 18 ? ?  ?End of Session Equipment Utilized During Treatment: Gait belt;Rolling walker (2 wheels) ? ?OT Visit Diagnosis: Unsteadiness on feet (R26.81);Other abnormalities of gait and mobility (R26.89);Muscle weakness (generalized) (M62.81);Pain ?  ?Activity Tolerance Patient tolerated treatment well ?  ?Patient Left with call bell/phone within reach;with nursing/sitter in room;in bed;with bed  alarm set ?  ?Nurse Communication Mobility status ?  ? ?   ? ?Time: 4098-11911014-1050 ?OT Time Calculation (min): 36 min ? ?Charges: OT General Charges ?$OT Visit: 1 Visit ?OT Treatments ?$Self Care/Home Management : 23-37 mins ? ?Alfonzo BeersNatalie Afnan Cadiente, OTD, OTR/L ?Acute Rehab ?[phone] ? ? ?Mayer MaskerNatalie M Blimy Napoleon ?09/08/2021, 12:21 PM ?

## 2021-09-08 NOTE — Progress Notes (Signed)
? ?Progress Note ? ?5 Days Post-Op  ?Subjective: ?Pt reports she is tolerating soft diet and had increased appetite over the course of the day yesterday. Denies n/v. Had a BM yesterday that was dark but not frankly bloody. Abdominal pain is controlled.  ? ?Objective: ?Vital signs in last 24 hours: ?Temp:  [98.8 ?F (37.1 ?C)-99.2 ?F (37.3 ?C)] 98.8 ?F (37.1 ?C) (04/05 0809) ?Pulse Rate:  [87-103] 92 (04/05 0809) ?Resp:  [16-18] 17 (04/05 0809) ?BP: (102-112)/(61-73) 111/73 (04/05 0809) ?SpO2:  [97 %-100 %] 100 % (04/05 0809) ?Last BM Date : 09/07/21 ? ?Intake/Output from previous day: ?04/04 0701 - 04/05 0700 ?In: 2574.9 [P.O.:240; I.V.:400; IV Piggyback:1934.9] ?Out: 370 [Urine:350; Drains:20] ?Intake/Output this shift: ?No intake/output data recorded. ? ?PE: ?General: pleasant, WD, overweight female who is laying in bed in NAD ?Heart: regular, rate, and rhythm.   ?Lungs:  Respiratory effort nonlabored ?Abd: soft, NT, ND, +BS, incision loosely approximated with staples present, drain in LLQ with SS fluid ? ? ?Lab Results:  ?Recent Labs  ?  09/07/21 ?0205 09/08/21 ?0322  ?WBC 14.9* 14.6*  ?HGB 8.6* 8.3*  ?HCT 26.2* 26.2*  ?PLT 497* 480*  ? ?BMET ?Recent Labs  ?  09/07/21 ?0205  ?NA 134*  ?K 3.5  ?CL 104  ?CO2 23  ?GLUCOSE 111*  ?BUN 7  ?CREATININE 0.35*  ?CALCIUM 8.2*  ? ?PT/INR ?No results for input(s): LABPROT, INR in the last 72 hours. ?CMP  ?   ?Component Value Date/Time  ? NA 134 (L) 09/07/2021 0205  ? K 3.5 09/07/2021 0205  ? CL 104 09/07/2021 0205  ? CO2 23 09/07/2021 0205  ? GLUCOSE 111 (H) 09/07/2021 0205  ? BUN 7 09/07/2021 0205  ? CREATININE 0.35 (L) 09/07/2021 0205  ? CALCIUM 8.2 (L) 09/07/2021 0205  ? PROT 6.4 (L) 09/07/2021 0205  ? ALBUMIN 2.3 (L) 09/07/2021 0205  ? AST 17 09/07/2021 0205  ? ALT 24 09/07/2021 0205  ? ALKPHOS 73 09/07/2021 0205  ? BILITOT 0.7 09/07/2021 0205  ? GFRNONAA >60 09/07/2021 0205  ? ?Lipase  ?No results found for: LIPASE ? ? ? ? ?Studies/Results: ?No results  found. ? ?Anti-infectives: ?Anti-infectives (From admission, onward)  ? ? Start     Dose/Rate Route Frequency Ordered Stop  ? 09/06/21 2200  vancomycin (VANCOREADY) IVPB 1500 mg/300 mL       ? 1,500 mg ?150 mL/hr over 120 Minutes Intravenous Every 12 hours 09/06/21 1257    ? 09/03/21 1355  vancomycin (VANCOCIN) powder  Status:  Discontinued       ?   As needed 09/03/21 1355 09/03/21 1426  ? 09/01/21 2200  vancomycin (VANCOREADY) IVPB 1750 mg/350 mL  Status:  Discontinued       ? 1,750 mg ?175 mL/hr over 120 Minutes Intravenous Every 12 hours 09/01/21 1058 09/06/21 1257  ? 08/30/21 1000  metroNIDAZOLE (FLAGYL) IVPB 500 mg  Status:  Discontinued       ? 500 mg ?100 mL/hr over 60 Minutes Intravenous Every 12 hours 08/30/21 0036 08/30/21 1708  ? 08/30/21 0600  metroNIDAZOLE (FLAGYL) IVPB 500 mg  Status:  Discontinued       ? 500 mg ?100 mL/hr over 60 Minutes Intravenous On call to O.R. 08/30/21 0220 08/30/21 0229  ? 08/30/21 0245  ciprofloxacin (CIPRO) IVPB 400 mg  Status:  Discontinued       ? 400 mg ?200 mL/hr over 60 Minutes Intravenous Every 12 hours 08/30/21 0037 08/30/21 1708  ? 08/30/21 0045  cefTRIAXone (ROCEPHIN) 2 g in sodium chloride 0.9 % 100 mL IVPB  Status:  Discontinued       ? 2 g ?200 mL/hr over 30 Minutes Intravenous Every 24 hours 08/30/21 0036 08/30/21 0036  ? 08/29/21 2315  metroNIDAZOLE (FLAGYL) IVPB 500 mg       ?Note to Pharmacy: TO MAIN OR STAT!! In surgery now  ? 500 mg ?100 mL/hr over 60 Minutes Intravenous To Surgery 08/29/21 2302 08/29/21 2319  ? 08/29/21 2200  vancomycin (VANCOREADY) IVPB 1250 mg/250 mL  Status:  Discontinued       ? 1,250 mg ?166.7 mL/hr over 90 Minutes Intravenous Every 12 hours 08/29/21 1857 09/01/21 1058  ? 08/29/21 2100  Vancomycin (VANCOCIN) 1,250 mg in sodium chloride 0.9 % 250 mL IVPB  Status:  Discontinued       ? 1,250 mg ?166.7 mL/hr over 90 Minutes Intravenous Every 12 hours 08/29/21 0837 08/29/21 1858  ? 08/29/21 0930  vancomycin (VANCOCIN) 2,000 mg in sodium  chloride 0.9 % 500 mL IVPB       ? 2,000 mg ?260 mL/hr over 120 Minutes Intravenous  Once 08/29/21 0837 08/29/21 1257  ? 08/26/21 2000  cefTRIAXone (ROCEPHIN) 2 g in sodium chloride 0.9 % 100 mL IVPB  Status:  Discontinued       ? 2 g ?200 mL/hr over 30 Minutes Intravenous Every 24 hours 08/26/21 1614 08/28/21 1736  ? 08/26/21 2000  DAPTOmycin (CUBICIN) 700 mg in sodium chloride 0.9 % IVPB  Status:  Discontinued       ? 8 mg/kg ? 87.2 kg ?128 mL/hr over 30 Minutes Intravenous Daily 08/26/21 1819 08/29/21 0842  ? ?  ? ? ? ?Assessment/Plan ?POD10, S/P Dx laparoscopy, exploratory laparotomy, graham patch repair of gastric perforation 3/26 Dr. Sophronia Simas for perforated gastric ulcer in posterior stomach ?- h.pylori negative ?- Tolerating soft diet and having bowel function, on PO PPI ?- drain is SS - remove today ?- ok to remove staples and apply bacitracin and dry dressing daily until healed over ?- IS/Pulm toilet, OOB as able ?  ?FEN: soft diet, IVF @KVO , PPI ?ID: per ID, IV Vanc ?VTE: SCD's, Lovenox  ?Foley: out ?  ?MRSA bacteremia ?R shoulder abscess/clavicle abscess - OR 3/31 by Dr. 4/31 ?B psoas abscess - S/P drainage by IR 3/30 ?Epidural abscess s/p lumbar laminectomy and drainage ?Pleural effusion s/p R pigtail chest tube 3/26 by CCM ?  ?PMH IVDA ?Suspected septic emboli  ? LOS: 13 days  ? ? ? ?4/26, PA-C ?[facility] ?09/08/2021, 11:52 AM ?Please see Amion for pager number during day hours 7:00am-4:30pm ? ?

## 2021-09-08 NOTE — Progress Notes (Signed)
JP drain and staples were removed around 1300 09/08/21. Pt did well and dressings were applied.  ?

## 2021-09-08 NOTE — Progress Notes (Signed)
[facility] ?PROGRESS NOTE ? ?Kimberly Booker:[id number] DOB: [birth year] DOA: 08/26/2021 ?PCP: Pcp, No ? ?Status: ?Remains inpatient appropriate because:  ?Unsafe discharge plan.  Patient with history of polysubstance abuse and therefore deemed inappropriate to discharge with PICC line in place for prolonged IV antibiotics.  ID documents last doses are due on 10/29/2021 ? ?Barriers to discharge: ?Social: ?None-she plans to discharge home with father in the [facility] area ? ?Clinical: ?Continues to require IV antibiotics to treat multiple infections including MRSA bacteremia ? ?Level of care: Progressive ? ? ?Code Status: Full ?Family Communication: Patient only ?DVT prophylaxis: Lovenox ?COVID vaccination status: No immunizations on file ? ?HPI: ?36 year old with past medical history significant for anxiety, depression, suicidal attempt drug overdose, polysubstance abuse, right clavicle fracture, was admitted at [facility] on 08/15/2021 due to right shoulder abscess.She was treated with I&D in the emergency department followed by IV antibiotics therapy as an inpatient.  She was diagnosed with MRSA bacteremia, endocarditis was ruled out with a TEE performed 3/16, but she continued to have positive blood cultures despite treatment with ceftriaxone and vancomycin.  Antibiotic therapy was changed to daptomycin.  She was then transferred to [facility]. ?Extensive imagings have been done here which showed right clavicle osteomyelitis/phlegmon/abscess, widespread cervical, thoracic, lumbar discitis/osteomyelitis/epidural abscess/cord impingement.  MRI also showed bilateral iliopsoas abscess, right complex pleural effusion.  ID, orthopedics, neurosurgery following. ?Underwent lumbar laminectomy/debridement of abscess, L4-L5 on 3/25. Hospital course also remarkable for finding of right-sided empyema, gastric perforation. Status post emergent laparotomy and perforation of repair by general  surgery.  PCCM was also following for chest tube on the right side, which was removed on 3/29. ?Underwent aspiration of the psoas abscesses on 3/30.  ?Underwent debridement of the right clavicle and application of wound VAC to the right shoulder area on 3/31. ?Developed severe lower back pain on 4/2.  MRI was done which showed improvement in the abscess.  Symptoms have improved.. ? ?Subjective: ?Currently without complaints. ? ?Objective: ?Vitals:  ? 09/08/21 0400 09/08/21 0809  ?BP: 112/63 111/73  ?Pulse: 94 92  ?Resp: 18 17  ?Temp: 99 ?F (37.2 ?C) 98.8 ?F (37.1 ?C)  ?SpO2: 97% 100%  ? ? ?Intake/Output Summary (Last 24 hours) at 09/08/2021 0827 ?Last data filed at 09/07/2021 2123 ?Gross per 24 hour  ?Intake 2574.88 ml  ?Output 20 ml  ?Net 2554.88 ml  ? ?Filed Weights  ? 08/28/21 1059 08/29/21 1417 09/02/21 1010  ?Weight: 86.4 kg 86.4 kg 77.1 kg  ? ? ?Exam: ?Constitutional: NAD, calm, comfortable ?ENMT: Mucous membranes are moist. Posterior pharynx clear of any exudate or lesions.extremely poor dentition ?Respiratory: clear to auscultation bilaterally, no wheezing, no crackles. Normal respiratory effort. No accessory muscle use.  Room air ?Cardiovascular: Regular rate and rhythm, no murmurs / rubs / gallops. No extremity edema. 2+ pedal pulses. No carotid bruits.  ?Musculoskeletal: Wound VAC to right axilla ?Abdomen: no tenderness, no masses palpated. No hepatosplenomegaly. Bowel sounds positive.  ?Neurologic: CN 2-12 grossly intact. Sensation intact, DTR normal. Strength 5/5 x all 4 extremities.  ?Psychiatric: Normal judgment and insight. Alert and oriented x 3. Normal mood.  ? ? ?Assessment/Plan: ?MRSA bacteremia ?Patient has disseminated MRSA bacteremia.  ?TEE at outside facility on 3/16, negative for endocarditis as per the report  ?TTE did not show any vegetation.  Per ID note dated 4/4 no clear indication for repeat TEE given suspected source control. ?Patient is on vancomycin.  WBC has been fluctuating.  She  remains afebrile.  Last blood cultures from 3/24 negative.  ID continues to follow.  ?Per ID patient will need a total of 8 weeks of antibiotic therapy starting from date of last surgery on 3/31 with tentative end date 5/26 ?Patient with extremely poor dentition and this could be the source of her bacteremia ?  ?Gastric perforation  ?Complained of abdominal pain on 3/26.  Chest x-ray done after chest tube placement showed incidental finding of pneumoperitoneum.  CT abdomen/pelvis confirmed pneumoperitoneum.  General surgery consulted and she underwent emergent exploratory laparatomy with finding of gastric perforation.  ?Currently tolerating soft diet ?  ?Abscess in epidural space of cervical spine ?Discitis/osteomyelitis at C2-3, C5-6, and C6-7. Ventral epidural abscess at C2-3, C5, and C6 with cord impingement especially at the lower 2 levels. Facet arthritis on the left at C7-T1 and right at C3-4. ?-Also showed discitis/osteomyelitis of T9- T10, T11-T12 ?-Neurosurger seen by neurosurgery previously.  No focal neurological deficits noted  ?  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 Bilateral facet arthritis at L5-S1. Dorsal epidural abscess at L4 and L5,highly compressive on the thecal sac.--S/P  lumbar laminectomy, debridement of abscess, L4-L5 on 3/25.   ?On 4/2 patient was complaining of severe pain in the lower back radiating down to her legs.  MRI lumbar spine was ordered. ?It redemonstrated the osteomyelitis at T11-12 and L5-S1.  Status post L4-L5 laminectomy/debridement with significant decrease in the amount of the abscess.  A small collection was noted posterior to the S1 causing moderate thecal sac narrowing.  Other findings of iliopsoas abscess and sacroiliac septic arthritis were noted as seen previously on MRI pelvis. ?These findings were discussed with neurosurgery.  Patient does not have any focal neurological  deficits.  No intervention was recommended. ?Symptoms appear to have improved.  Reason for her symptoms are not entirely clear.  Continue to monitor.  No focal neurological deficits noted.   ?  ?Iliopsoas abscess /Sacroiliac septic arthritis/osteomyelitis ? Pelvic MRI showed bilateral infectious sacroiliitis and osteomyelitis of the sacrum and periarticular right iliac bone. Bilateral psoas abscesses, Small bilateral paraspinal muscle abscesses, intramuscular abscess within the left piriformis muscle, small intramuscular abscess within the left gluteus medius muscle. ?Seen by interventional radiology and underwent aspiration on 3/30.  MRSA noted on cultures.   ?  ?Pleural effusion ?Chest CT showed a large loculated right pleural effusion concerning for empyema, subcentimeter groundglass nodularity within the bilateral lungs concerning for septic emboli.  ?S/P chest tube placement.  Fluid noted to be exudative.  Cultures without any growth.  No need for DNase/tPA.   ?Chest x-ray showed resolution of the effusion.  Chest tube was discontinued on 3/29.  Respiratory status is stable. ?  ?Normocytic anemia ?Hemoglobin low but stable.  ?Received 2 units PRBC at outside facility.  ?Current hemoglobin 8.3 ?  ?Anxiety with depression ?Continue Prozac and Ativan prn ?  ?Acute osteomyelitis of clavicle, right (HCC) ?MRI of the shoulder showed osteomyelitis, phlegmon, possible abscess.  Orthopedics following.  Underwent debridement of right clavicle and application of wound VAC on 3/31.  Cultures pending. ?  ?Polysubstance abuse  ?Patient reports last drug use was 1 year ago. ?  ?Pain management/physical deconditioning ?Patient with MRSA infection at multiple sites.  Subsequently with gastric perforation.  Pain management has been difficult.  She was subsequently started on a fentanyl patch by orthopedics.  Also on Dilaudid intravenously as needed.  Percocet orally as needed.  Continue bowel regimen. ?She is  currently reporting  adequate pain can ?She is continuing to work with PT and OT ?  ?Transaminitis ?HCV antibody positive ?HCV quantitative was not detected. ?LFTs noted to be normal on 4/4. ?  ? ? ? ?Data Reviewed: ?Basic [facility]aboli

## 2021-09-08 NOTE — TOC CAGE-AID Note (Signed)
Transition of Care (TOC) - CAGE-AID Screening ? ? ?Patient Details  ?Name: Kimberly Booker ?MRN: [id number] ?Date of Birth: 1985/07/15 ? ?Transition of Care (TOC) CM/SW Contact:    ?Janae Bridgeman, RN ?Phone Number: ?09/08/2021, 2:06 PM ? ? ?Clinical Narrative: ?CAGE Aid completed.  The patient declines current drug use at this time - outside of present marijuana use. ? ? ?CAGE-AID Screening: ?  ? ?Have You Ever Felt You Ought to Cut Down on Your Drinking or Drug Use?: No ?Have People Annoyed You By Critizing Your Drinking Or Drug Use?: No ?Have You Felt Bad Or Guilty About Your Drinking Or Drug Use?: No ?Have You Ever Had a Drink or Used Drugs First Thing In The Morning to Steady Your Nerves or to Get Rid of a Hangover?: No ?CAGE-AID Score: 0 ? ?Substance Abuse Education Offered: Yes (Patient states that she only uses marijuana at this time.) ? ?Substance abuse interventions: Other (must comment) (Patient denies current drug use -) ? ? ? ? ? ? ?

## 2021-09-08 NOTE — Plan of Care (Signed)
?  Problem: Education: ?Goal: Knowledge of General Education information will improve ?Description: Including pain rating scale, medication(s)/side effects and non-pharmacologic comfort measures ?Outcome: Not Progressing ?  ?Problem: Health Behavior/Discharge Planning: ?Goal: Ability to manage health-related needs will improve ?Outcome: Not Progressing ?  ?Problem: Clinical Measurements: ?Goal: Ability to maintain clinical measurements within normal limits will improve ?Outcome: Not Progressing ?Goal: Will remain free from infection ?Outcome: Not Progressing ?Goal: Diagnostic test results will improve ?Outcome: Not Progressing ?Goal: Respiratory complications will improve ?Outcome: Not Progressing ?Goal: Cardiovascular complication will be avoided ?Outcome: Not Progressing ?  ?Problem: Nutrition: ?Goal: Adequate nutrition will be maintained ?Outcome: Not Progressing ?  ?Problem: Coping: ?Goal: Level of anxiety will decrease ?Outcome: Not Progressing ?  ?Problem: Elimination: ?Goal: Will not experience complications related to bowel motility ?Outcome: Not Progressing ?Goal: Will not experience complications related to urinary retention ?Outcome: Not Progressing ?  ?Problem: Pain Managment: ?Goal: General experience of comfort will improve ?Outcome: Not Progressing ?  ?

## 2021-09-08 NOTE — Discharge Instructions (Signed)
[facility]      [facility] °[phone] ° °OPEN ABDOMINAL SURGERY: POST OP INSTRUCTIONS ° °Always review your discharge instruction sheet given to you by the facility where your surgery was performed. ° °IF YOU HAVE DISABILITY OR FAMILY LEAVE FORMS, YOU MUST BRING THEM TO THE OFFICE FOR PROCESSING.  PLEASE DO NOT GIVE THEM TO YOUR DOCTOR. ° °A prescription for pain medication may be given to you upon discharge.  Take your pain medication as prescribed, if needed.  If narcotic pain medicine is not needed, then you may take acetaminophen (Tylenol) or ibuprofen (Advil) as needed. °Take your usually prescribed medications unless otherwise directed. °If you need a refill on your pain medication, please contact your pharmacy. They will contact our office to request authorization.  Prescriptions will not be filled after 5pm or on week-ends. °You should follow a light diet the first few days after arrival home, such as soup and crackers, pudding, etc.unless your doctor has advised otherwise. A high-fiber, low fat diet can be resumed as tolerated.   Be sure to include lots of fluids daily. Most patients will experience some swelling and bruising on the chest and neck area.  Ice packs will help.  Swelling and bruising can take several days to resolve °Most patients will experience some swelling and bruising in the area of the incision. Ice pack will help. Swelling and bruising can take several days to resolve..  °It is common to experience some constipation if taking pain medication after surgery.  Increasing fluid intake and taking a stool softener will usually help or prevent this problem from occurring.  A mild laxative (Milk of Magnesia or Miralax) should be taken according to package directions if there are no bowel movements after 48 hours. ° You may have steri-strips (small skin tapes) in place directly over the incision.  These strips should be left on the skin for 7-10 days.  If your surgeon used skin  glue on the incision, you may shower in 24 hours.  The glue will flake off over the next 2-3 weeks.  Any sutures or staples will be removed at the office during your follow-up visit. You may find that a light gauze bandage over your incision may keep your staples from being rubbed or pulled. You may shower and replace the bandage daily. °ACTIVITIES:  You may resume regular (light) daily activities beginning the next day--such as daily self-care, walking, climbing stairs--gradually increasing activities as tolerated.  You may have sexual intercourse when it is comfortable.  Refrain from any heavy lifting or straining until approved by your doctor. °You may drive when you no longer are taking prescription pain medication, you can comfortably wear a seatbelt, and you can safely maneuver your car and apply brakes ° °You should see your doctor in the office for a follow-up appointment approximately two weeks after your surgery.  Make sure that you call for this appointment within a day or two after you arrive home to insure a convenient appointment time. ° °WHEN TO CALL YOUR DOCTOR: °Fever over 101.0 °Inability to urinate °Nausea and/or vomiting °Extreme swelling or bruising °Continued bleeding from incision. °Increased pain, redness, or drainage from the incision. °Difficulty swallowing or breathing °Muscle cramping or spasms. °Numbness or tingling in hands or feet or around lips. ° °The clinic staff is available to answer your questions during regular business hours.  Please don’t hesitate to call and ask to speak to one of the nurses if you have concerns. ° °For   further questions, please visit [email or web address]  °

## 2021-09-08 NOTE — Progress Notes (Signed)
Mobility Specialist: Progress Note ? ? 09/08/21 1300  ?Mobility  ?Activity Ambulated with assistance in room  ?Level of Assistance Minimal assist, patient does 75% or more  ?Assistive Device Front wheel walker  ?RUE Weight Bearing WBAT  ?Distance Ambulated (ft) 30 ft  ?Activity Response Tolerated well  ?$Mobility charge 1 Mobility  ? ?Pt received in bed and agreeable to ambulation. Mod I with bed mobility and minA to stand. C/o R shoulder and back pain throughout, otherwise asymptomatic. To BR, void successful, and then to the chair. Pt is in the recliner with call bell in her lap and chair alarm on.  ? ?Harrell Gave Vonzell Lindblad ?Mobility Specialist ?Mobility Specialist [place]: [phone] ?Mobility Specialist [place]: [phone] ? ?

## 2021-09-08 NOTE — TOC Progression Note (Signed)
Transition of Care (TOC) - Progression Note  ? ? ?Patient Details  ?Name: Kimberly Booker ?MRN: [id number] ?Date of Birth: 1986-06-04 ? ?Transition of Care (TOC) CM/SW Contact  ?Curlene Labrum, RN ?Phone Number: ?09/08/2021, 1:56 PM ? ?Clinical Narrative:    ?CM met with the patient in the hospital room to discuss transitions of care needs.  The patient is currently admitted to the hospital to receive IV antibiotics for 8 weeks through a present peripheral IV - since patient is not a safe candidate for home IV antibiotics since the patient has history of polysubstance abuse.  The patient denies drug abuse other than marijuana use at this time.  The patient was living with her sister in [place], [place] prior to admission and plans to discharge home with her father, Roniesha Hollingshead, who currently lives in [place], [place].  I informed the patient that if she moves to [place], that she will need to reapply for [facility] Medicaid if she plans to remain out of state so that she can be provided with medical coverage through her current [place] Medicaid and the patient is aware. ? ?FL2 was completed and patient [facility] out to [facility] facilities to explore availability for possible placement at SNF for remaining weeks of IV antibiotics - placement is unlikely due to history of polysubstance abuse but TOC Team will continue to evaluate for options for placement. ? ?CM and MSW with DTP Team will continue to follow the patient for discharge planning - patient will be discharged home after receiving IV antibiotics through 10/29/2021 per infectious disease MD. ? ? ? ? ? ? ?Expected Discharge Plan: Home/Self Care ?Barriers to Discharge: Continued Medical Work up ? ?Expected Discharge Plan and Services ?Expected Discharge Plan: Home/Self Care ?In-house Referral: Clinical Social Work, PCP / Psychologist, educational ?Discharge Planning Services: CM Consult ?  ?Living arrangements for the past 2 months: [place] ?                ?  ?  ?  ?   ?  ?  ?  ?  ?  ?  ? ? ?Social Determinants of Health (SDOH) Interventions ?  ? ?Readmission Risk Interventions ?   ? View : No data to display.  ?  ?  ?  ? ? ?

## 2021-09-08 NOTE — Progress Notes (Signed)
Physical Therapy Treatment ?Patient Details ?Name: Kimberly Booker ?MRN: [id number] ?DOB: August 27, 1985 ?Today's Date: 09/08/2021 ? ? ?History of Present Illness Pt is a 36 y.o. female initially admitted to [facility] 08/15/21 with R shoulder abscess; admit to [facility] 08/26/21 with MRSA bacteremia. 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 S/p L4-5 laminectomy and abscess debridement 3/25. Pt with pleural effusion s/p R pigtail chest tube 3/26. Abdominal CT showed bowel perforation; s/p ex lap with patch repair on 3/27. S/p R psoas abscess drainage on 3/30. S/p R clavicle I&D with wound vac placement 3/31. Pt developed severe LBP on 4/2; MRI showed improvement in abscess. PMH includes anxiety, depression, polysubstance abuse, R clavicle fx ~8 mo prior to admission. ?  ?PT Comments  ? ? Pt progressing well with mobility. Today's session focused on gait training with rollator; pt moving well with intermittent min guard for balance due to knee instability. Pt remains limited by generalized weakness, decreased activity tolerance, pain, and impaired balance strategies/postural reactions. Pt with improved affect and motivation to participate; pt endorses good pain control today. From a mobility perspective, feel pt no longer requires post-acute rehab therapies; d/c recommendations updated. Will continue to follow acutely to address established goals. ?   ?Recommendations for follow up therapy are one component of a multi-disciplinary discharge planning process, led by the attending physician.  Recommendations may be updated based on patient status, additional functional criteria and insurance authorization. ? ?Follow Up Recommendations ? Home health PT (likely progress to no PT) ?  ?  ?Assistance Recommended at Discharge Intermittent Supervision/Assistance  ?Patient can return home with the following A little help with walking and/or transfers;Assistance with cooking/housework;Assist for transportation;Help  with stairs or ramp for entrance;A little help with bathing/dressing/bathroom ?  ?Equipment Recommendations ?  (TBD)  ?  ?Recommendations for Other Services   ? ? ?  ?Precautions / Restrictions Precautions ?Precautions: Fall;Back;Other (comment) ?Precaution Comments: R clavicle wound vac; abdominal precautions for comfort ?Restrictions ?Weight Bearing Restrictions: Yes ?RUE Weight Bearing: Weight bearing as tolerated  ?  ? ?Mobility ? Bed Mobility ?Overal bed mobility: Modified Independent ?  ?  ?  ?  ?  ?  ?General bed mobility comments: return to supine with bed flat, no assist ?  ? ?Transfers ?Overall transfer level: Needs assistance ?Equipment used: Rollator (4 wheels), 1 person hand held assist ?Transfers: Sit to/from Stand ?Sit to Stand: Min guard ?  ?Step pivot transfers: Min guard ?  ?  ?  ?General transfer comment: cues for locking/unlocking rollator brakes, able to stand with and without rollator with min guard; pivotal steps from recliner to bed with HHA for stability ?  ? ?Ambulation/Gait ?Ambulation/Gait assistance: Min guard ?Gait Distance (Feet): 88 Feet ?Assistive device: Rollator (4 wheels) ?Gait Pattern/deviations: Step-through pattern, Decreased stride length, Knees buckling, Antalgic ?Gait velocity: Decreased ?  ?  ?General Gait Details: Slow, antalgic gait with rollator and intermittent min guard for balance; self-corrected knee instability, cues for activity pacing ? ? ?Stairs ?  ?  ?  ?  ?  ? ? ?Wheelchair Mobility ?  ? ?Modified Rankin (Stroke Patients Only) ?  ? ? ?  ?Balance Overall balance assessment: Needs assistance ?Sitting-balance support: Feet supported ?Sitting balance-Leahy Scale: Good ?  ?  ?Standing balance support: Single extremity supported, Bilateral upper extremity supported, During functional activity ?Standing balance-Leahy Scale: Fair ?Standing balance comment: can static stand without UE support; preference for single UE support for dynamic task ?  ?  ?  ?  ?  ?  ?  ?  ?   ?  ?  ?  ? ?  ?  Cognition Arousal/Alertness: Awake/alert ?Behavior During Therapy: [facility] for tasks assessed/performed ?Overall Cognitive Status: Within Functional Limits for tasks assessed ?  ?  ?  ?  ?  ?  ?  ?  ?  ?  ?  ?  ?  ?  ?  ?  ?  ?  ?  ? ?  ?Exercises Other Exercises ?Other Exercises: Med[employer] HEP handout (Access Code [email or web address]) provided - deferred due to pain, will trial next session; encouraged pt to perform seated/supine therex later today ? ?  ?General Comments General comments (skin integrity, edema, etc.): HR up to 90s noted with activity today; pt in good spirits, reports good pain control. reinforced activity recommendations ?  ?  ? ?Pertinent Vitals/Pain Pain Assessment ?Pain Assessment: Faces ?Faces Pain Scale: Hurts a little bit ?Pain Location: LE, shoulder ?Pain Descriptors / Indicators: Sore, Guarding ?Pain Intervention(s): Monitored during session, Limited activity within patient's tolerance  ? ? ?Home Living   ?  ?  ?  ?  ?  ?  ?  ?  ?  ?   ?  ?Prior Function    ?  ?  ?   ? ?PT Goals (current goals can now be found in the care plan section) Progress towards PT goals: Progressing toward goals ? ?  ?Frequency ? ? ? Min 5X/week ? ? ? ?  ?PT Plan Discharge plan needs to be updated  ? ? ?Co-evaluation   ?  ?  ?  ?  ? ?  ?AM-PAC PT "6 Clicks" Mobility   ?Outcome Measure ? Help needed turning from your back to your side while in a flat bed without using bedrails?: None ?Help needed moving from lying on your back to sitting on the side of a flat bed without using bedrails?: None ?Help needed moving to and from a bed to a chair (including a wheelchair)?: A Little ?Help needed standing up from a chair using your arms (e.g., wheelchair or bedside chair)?: A Little ?Help needed to walk in hospital room?: A Little ?Help needed climbing 3-5 steps with a railing? : A Lot ?6 Click Score: 19 ? ?  ?End of Session Equipment Utilized During Treatment: Gait belt ?Activity Tolerance: Patient tolerated treatment  well ?Patient left: in bed;with call bell/phone within reach;with bed alarm set ?Nurse Communication: Mobility status ?PT Visit Diagnosis: Other abnormalities of gait and mobility (R26.89);Muscle weakness (generalized) (M62.81);Pain ?  ? ? ?Time: 4315-4008 ?PT Time Calculation (min) (ACUTE ONLY): 22 min ? ?Charges:  $Gait Training: 8-22 mins          ?          ? ?Ina Homes, PT, DPT ?Acute Rehabilitation Services  ?Pager [phone] ?Office [phone] ? ?Malachy Chamber ?09/08/2021, 5:31 PM ? ?

## 2021-09-08 NOTE — Progress Notes (Signed)
Spoke with MD Blake Divine earlier that pt wanted oral Robaxin instead of IV. Pt does not want to be tied down to IV pole constantly. Gave PRN med of robaxin instead. MD made aware.  ?

## 2021-09-09 DIAGNOSIS — B9562 Methicillin resistant Staphylococcus aureus infection as the cause of diseases classified elsewhere: Secondary | ICD-10-CM | POA: Diagnosis not present

## 2021-09-09 DIAGNOSIS — R7881 Bacteremia: Secondary | ICD-10-CM | POA: Diagnosis not present

## 2021-09-09 LAB — CBC
HCT: 28.7 % — ABNORMAL LOW (ref 36.0–46.0)
Hemoglobin: 9.2 g/dL — ABNORMAL LOW (ref 12.0–15.0)
MCH: 30.6 pg (ref 26.0–34.0)
MCHC: 32.1 g/dL (ref 30.0–36.0)
MCV: 95.3 fL (ref 80.0–100.0)
Platelets: 491 10*3/uL — ABNORMAL HIGH (ref 150–400)
RBC: 3.01 MIL/uL — ABNORMAL LOW (ref 3.87–5.11)
RDW: 14.7 % (ref 11.5–15.5)
WBC: 15.1 10*3/uL — ABNORMAL HIGH (ref 4.0–10.5)
nRBC: 0 % (ref 0.0–0.2)

## 2021-09-09 MED ORDER — ADULT MULTIVITAMIN W/MINERALS CH
1.0000 | ORAL_TABLET | Freq: Every day | ORAL | Status: DC
  Administered 2021-09-09 – 2021-10-29 (×51): 1 via ORAL
  Filled 2021-09-09 (×50): qty 1

## 2021-09-09 MED ORDER — ENSURE ENLIVE PO LIQD
237.0000 mL | Freq: Two times a day (BID) | ORAL | Status: DC
  Administered 2021-09-09 – 2021-09-29 (×29): 237 mL via ORAL

## 2021-09-09 MED ORDER — METHOCARBAMOL 500 MG PO TABS
1000.0000 mg | ORAL_TABLET | Freq: Four times a day (QID) | ORAL | Status: DC
  Administered 2021-09-09 – 2021-10-19 (×155): 1000 mg via ORAL
  Filled 2021-09-09 (×158): qty 2

## 2021-09-09 NOTE — Progress Notes (Signed)
Physical Therapy Treatment ?Patient Details ?Name: Kimberly Booker ?MRN: [id number] ?DOB: 1986-04-30 ?Today's Date: 09/09/2021 ? ? ?History of Present Illness Pt is a 36 y.o. female initially admitted to [facility] 08/15/21 with R shoulder abscess; admit to [facility] 08/26/21 with MRSA bacteremia. 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 S/p L4-5 laminectomy and abscess debridement 3/25. Pt with pleural effusion s/p R pigtail chest tube 3/26. Abdominal CT showed bowel perforation; s/p ex lap with patch repair on 3/27. S/p R psoas abscess drainage on 3/30. S/p R clavicle I&D with wound vac placement 3/31. Pt developed severe LBP on 4/2; MRI showed improvement in abscess. PMH includes anxiety, depression, polysubstance abuse, R clavicle fx ~8 mo prior to admission. ?  ?PT Comments  ? ? Pt progressing with mobility, tolerating increased ambulation distance and demonstrating more independence with ADL tasks. Pt remains motivated to participate; expect she will progress well with mobility and not require follow-up PT. Will continue to follow acutely to address established goals.  ?   ?Recommendations for follow up therapy are one component of a multi-disciplinary discharge planning process, led by the attending physician.  Recommendations may be updated based on patient status, additional functional criteria and insurance authorization. ? ?Follow Up Recommendations ? Home health PT (likely progress to no PT) ?  ?  ?Assistance Recommended at Discharge Intermittent Supervision/Assistance  ?Patient can return home with the following A little help with walking and/or transfers;Assistance with cooking/housework;Assist for transportation;Help with stairs or ramp for entrance;A little help with bathing/dressing/bathroom ?  ?Equipment Recommendations ?  (TBD)  ?  ?Recommendations for Other Services   ? ? ?  ?Precautions / Restrictions Precautions ?Precautions: Fall;Back;Other (comment) ?Precaution Comments: R  clavicle wound vac; abdominal precautions for comfort ?Restrictions ?Weight Bearing Restrictions: Yes ?RUE Weight Bearing: Weight bearing as tolerated  ?  ? ?Mobility ? Bed Mobility ?Overal bed mobility: Modified Independent ?  ?  ?  ?  ?  ?  ?General bed mobility comments: return to supine with bed flat, no assist ?  ? ?Transfers ?Overall transfer level: Needs assistance ?Equipment used: Rollator (4 wheels) ?Transfers: Sit to/from Stand ?Sit to Stand: Min guard ?  ?  ?  ?  ?  ?General transfer comment: pt self-correcting not locking rollator brakes; multiple sit<>stands from recliner, low toilet height (use of grab bar to pull up) and EOB to rollator with min guard for balance ?  ? ?Ambulation/Gait ?Ambulation/Gait assistance: Min guard ?Gait Distance (Feet): 132 Feet ?Assistive device: Rollator (4 wheels) ?Gait Pattern/deviations: Step-through pattern, Decreased stride length, Antalgic, Trunk flexed ?Gait velocity: Decreased ?  ?  ?General Gait Details: Slow, guarded gait with rollator and intermittent min guard for balance; no knee instability noted this session; pt declines further distance since she was "sore overnight from yesterday's walk" ? ? ?Stairs ?  ?  ?  ?  ?  ? ? ?Wheelchair Mobility ?  ? ?Modified Rankin (Stroke Patients Only) ?  ? ? ?  ?Balance Overall balance assessment: Needs assistance ?Sitting-balance support: Feet supported ?Sitting balance-Leahy Scale: Good ?Sitting balance - Comments: indep with pericare sitting on toilet ?  ?Standing balance support: Single extremity supported, Bilateral upper extremity supported, During functional activity ?Standing balance-Leahy Scale: Fair ?Standing balance comment: can static stand without UE support; preference for single UE support for dynamic task ?  ?  ?  ?  ?  ?  ?  ?  ?  ?  ?  ?  ? ?  ?  Cognition Arousal/Alertness: Awake/alert ?Behavior During Therapy: [facility] for tasks assessed/performed ?Overall Cognitive Status: Within Functional Limits for tasks  assessed ?  ?  ?  ?  ?  ?  ?  ?  ?  ?  ?  ?  ?  ?  ?  ?  ?  ?  ?  ? ?  ?Exercises   ? ?  ?General Comments General comments (skin integrity, edema, etc.): HR up to 100s noted with activity ?  ?  ? ?Pertinent Vitals/Pain Pain Assessment ?Pain Assessment: Faces ?Faces Pain Scale: Hurts little more ?Pain Location: back ?Pain Descriptors / Indicators: Sore, Guarding ?Pain Intervention(s): Monitored during session, Limited activity within patient's tolerance  ? ? ?Home Living   ?  ?  ?  ?  ?  ?  ?  ?  ?  ?   ?  ?Prior Function    ?  ?  ?   ? ?PT Goals (current goals can now be found in the care plan section) Progress towards PT goals: Progressing toward goals ? ?  ?Frequency ? ? ? Min 5X/week ? ? ? ?  ?PT Plan Current plan remains appropriate  ? ? ?Co-evaluation   ?  ?  ?  ?  ? ?  ?AM-PAC PT "6 Clicks" Mobility   ?Outcome Measure ? Help needed turning from your back to your side while in a flat bed without using bedrails?: None ?Help needed moving from lying on your back to sitting on the side of a flat bed without using bedrails?: None ?Help needed moving to and from a bed to a chair (including a wheelchair)?: A Little ?Help needed standing up from a chair using your arms (e.g., wheelchair or bedside chair)?: A Little ?Help needed to walk in hospital room?: A Little ?Help needed climbing 3-5 steps with a railing? : A Little ?6 Click Score: 20 ? ?  ?End of Session Equipment Utilized During Treatment: Gait belt ?Activity Tolerance: Patient tolerated treatment well ?Patient left: in bed;with call bell/phone within reach;with bed alarm set ?Nurse Communication: Mobility status ?PT Visit Diagnosis: Other abnormalities of gait and mobility (R26.89);Muscle weakness (generalized) (M62.81);Pain ?  ? ? ?Time: 9509-3267 ?PT Time Calculation (min) (ACUTE ONLY): 25 min ? ?Charges:  $Gait Training: 8-22 mins ?$Therapeutic Activity: 8-22 mins          ?          ? ?Ina Homes, PT, DPT ?Acute Rehabilitation Services  ?Pager  [phone] ?Office [phone] ? ?Malachy Chamber ?09/09/2021, 5:22 PM ? ?

## 2021-09-09 NOTE — Progress Notes (Signed)
[facility] ?PROGRESS NOTE ? ?Kimberly Booker PRF:[id number] DOB: 03-16-1986 DOA: 08/26/2021 ?PCP: Pcp, No ? ?Status: ?Remains inpatient appropriate because:  ?Unsafe discharge plan.  Patient with history of polysubstance abuse and therefore deemed inappropriate to discharge with PICC line in place for prolonged IV antibiotics.  ID documents last doses are due on 10/29/2021 ? ?Barriers to discharge: ?Social: ?None-she plans to discharge home with father in the [facility] [place] area ? ?Clinical: ?Continues to require IV antibiotics to treat multiple infections including MRSA bacteremia ? ?Level of care: Progressive ? ? ?Code Status: Full ?Family Communication: Patient only ?DVT prophylaxis: Lovenox ?COVID vaccination status: No immunizations on file ? ?HPI: ?36 year old with past medical history significant for anxiety, depression, suicidal attempt drug overdose, polysubstance abuse, right clavicle fracture, was admitted at [facility] on 08/15/2021 due to right shoulder abscess.She was treated with I&D in the emergency department followed by IV antibiotics therapy as an inpatient.  She was diagnosed with MRSA bacteremia, endocarditis was ruled out with a TEE performed 3/16, but she continued to have positive blood cultures despite treatment with ceftriaxone and vancomycin.  Antibiotic therapy was changed to daptomycin.  She was then transferred to [facility]. ?Extensive imagings have been done here which showed right clavicle osteomyelitis/phlegmon/abscess, widespread cervical, thoracic, lumbar discitis/osteomyelitis/epidural abscess/cord impingement.  MRI also showed bilateral iliopsoas abscess, right complex pleural effusion.  ID, orthopedics, neurosurgery following. ?Underwent lumbar laminectomy/debridement of abscess, L4-L5 on 3/25. Hospital course also remarkable for finding of right-sided empyema, gastric perforation. Status post emergent laparotomy and perforation of repair by general  surgery.  PCCM was also following for chest tube on the right side, which was removed on 3/29. ?Underwent aspiration of the psoas abscesses on 3/30.  ?Underwent debridement of the right clavicle and application of wound VAC to the right shoulder area on 3/31. ?Developed severe lower back pain on 4/2.  MRI was done which showed improvement in the abscess.  Symptoms have improved.. ? ?Subjective: ?Sleeping soundly ? ?Objective: ?Vitals:  ? 09/09/21 0600 09/09/21 0741  ?BP: 110/72 (!) 96/59  ?Pulse: 85 79  ?Resp: 16 17  ?Temp: 98 ?F (36.7 ?C) 98.6 ?F (37 ?C)  ?SpO2: 96% 97%  ? ? ?Intake/Output Summary (Last 24 hours) at 09/09/2021 0824 ?Last data filed at 09/09/2021 0244 ?Gross per 24 hour  ?Intake --  ?Output 250 ml  ?Net -250 ml  ? ?Filed Weights  ? 08/28/21 1059 08/29/21 1417 09/02/21 1010  ?Weight: 86.4 kg 86.4 kg 77.1 kg  ? ? ?Exam: ?Constitutional: Sleeping soundly ?Respiratory: clear to auscultation bilaterally, no wheezing, no crackles. Normal respiratory effort. No accessory muscle use.  Room air ?Cardiovascular: S1-S2, normotensive, regular pulse, no peripheral edema ?Musculoskeletal: Wound VAC to right axilla ?Abdomen: no tenderness, no masses palpated. Bowel sounds positive. LBM 4/4 ?Neurologic: Sleeping but at baseline CN 2-12 grossly intact. Sensation intact, DTR normal. Strength 5/5 x all 4 extremities.  ?Psychiatric: Sleeping but at baseline normal judgment and insight. Alert and oriented x 3. Normal mood.  ? ? ?Assessment/Plan: ?MRSA bacteremia ?Patient has disseminated MRSA bacteremia.  ?TEE at outside facility on 3/16, negative for endocarditis as per the report  ?TTE did not show any vegetation.  Per ID note dated 4/4 no clear indication for repeat TEE given suspected source control. ?Patient is on vancomycin.  WBC has been fluctuating.  She remains afebrile.  Last blood cultures from 3/24 negative.  ID continues to follow.  ?Per ID patient will need a total of  8 weeks of antibiotic therapy starting  from date of last surgery on 3/31 with tentative end date 5/26 ?Patient with extremely poor dentition and this could be the source of her bacteremia ?  ?Gastric perforation  ?Complained of abdominal pain on 3/26.  Chest x-ray done after chest tube placement showed incidental finding of pneumoperitoneum.  CT abdomen/pelvis confirmed pneumoperitoneum.  General surgery consulted and she underwent emergent exploratory laparatomy with finding of gastric perforation.  ?Currently tolerating soft diet ?  ?Abscess in epidural space of cervical spine ?Discitis/osteomyelitis at C2-3, C5-6, and C6-7. Ventral epidural abscess at C2-3, C5, and C6 with cord impingement especially at the lower 2 levels. Facet arthritis on the left at C7-T1 and right at C3-4. ?-Also showed discitis/osteomyelitis of T9- T10, T11-T12 ?-Neurosurger seen by neurosurgery previously.  No focal neurological deficits noted  ?  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 Bilateral facet arthritis at L5-S1. Dorsal epidural abscess at L4 and L5,highly compressive on the thecal sac.--S/P  lumbar laminectomy, debridement of abscess, L4-L5 on 3/25.   ?On 4/2 patient was complaining of severe pain in the lower back radiating down to her legs.  MRI lumbar spine was ordered. ?It redemonstrated the osteomyelitis at T11-12 and L5-S1.  Status post L4-L5 laminectomy/debridement with significant decrease in the amount of the abscess.  A small collection was noted posterior to the S1 causing moderate thecal sac narrowing.  Other findings of iliopsoas abscess and sacroiliac septic arthritis were noted as seen previously on MRI pelvis. ?These findings were discussed with neurosurgery.  Patient does not have any focal neurological deficits.  No intervention was recommended. ?Symptoms appear to have improved.  Reason for her symptoms are not entirely clear.  Continue to monitor.  No focal  neurological deficits noted.   ?  ?Iliopsoas abscess /Sacroiliac septic arthritis/osteomyelitis ? Pelvic MRI showed bilateral infectious sacroiliitis and osteomyelitis of the sacrum and periarticular right iliac bone. Bilateral psoas abscesses, Small bilateral paraspinal muscle abscesses, intramuscular abscess within the left piriformis muscle, small intramuscular abscess within the left gluteus medius muscle. ?Seen by interventional radiology and underwent aspiration on 3/30.  MRSA noted on cultures.   ?  ?Pleural effusion ?Chest CT showed a large loculated right pleural effusion concerning for empyema, subcentimeter groundglass nodularity within the bilateral lungs concerning for septic emboli.  ?S/P chest tube placement.  Fluid noted to be exudative.  Cultures without any growth.  No need for DNase/tPA.   ?Chest x-ray showed resolution of the effusion.  Chest tube was discontinued on 3/29.  Respiratory status is stable. ?  ?Normocytic anemia ?Hemoglobin low but stable.  ?Received 2 units PRBC at outside facility.  ?Current hemoglobin 8.3 ?  ?Anxiety with depression ?Continue Prozac and Ativan prn ?  ?Acute osteomyelitis of clavicle, right (HCC) ?MRI of the shoulder showed osteomyelitis, phlegmon, possible abscess.  Orthopedics following.  Underwent debridement of right clavicle and application of wound VAC on 3/31.  Cultures pending. ?  ?Polysubstance abuse  ?Patient reports last drug use was 1 year ago. ?  ?Pain management/physical deconditioning ?Patient with MRSA infection at multiple sites.  Subsequently with gastric perforation.  Pain management has been difficult.  She was subsequently started on a fentanyl patch by orthopedics.  Also on Dilaudid intravenously as needed.  Percocet orally as needed.  Continue bowel regimen. ?She is currently reporting adequate pain can ?She is continuing to work with PT and OT ?Patient had requested overnight to have IV Robaxin  changed to oral.  I have subsequently changed  to Robaxin 1000 mg p.o. scheduled every 6 hours ?  ?Transaminitis ?HCV antibody positive ?HCV quantitative was not detected. ?LFTs noted to be normal on 4/4. ?  ? ? ? ?Data Reviewed: ?Basic Metabolic Panel: ?Rece

## 2021-09-09 NOTE — Progress Notes (Signed)
Mobility Specialist: Progress Note ? ? 09/09/21 1349  ?Mobility  ?Activity Ambulated with assistance in hallway  ?Level of Assistance Contact guard assist, steadying assist  ?Assistive Device Four wheel walker  ?RUE Weight Bearing WBAT  ?Distance Ambulated (ft) 124 ft  ?Activity Response Tolerated well  ?$Mobility charge 1 Mobility  ? ?Pt received in bed and agreeable to ambulation. Mod I with bed mobility and contact guard during ambulation. C/o back pain throughout she rated 7/10, otherwise asymptomatic. Pt to recliner after session with call bell in her lap and all needs met.  ? ?Harrell Gave Kayline Sheer ?Mobility Specialist ?Mobility Specialist [place]: [phone] ?Mobility Specialist [place]: [phone] ? ?

## 2021-09-09 NOTE — Progress Notes (Signed)
Nutrition Follow-up ? ?DOCUMENTATION CODES:  ?Not applicable ? ?INTERVENTION:  ?-Ensure Enlive po BID, each supplement provides 350 kcal and 20 grams of protein. ?-MVI with minerals daily ? ?NUTRITION DIAGNOSIS:  ?Increased nutrient needs related to acute illness, post-op healing, wound healing as evidenced by estimated needs. ?ongoing ? ?GOAL:  ?Patient will meet greater than or equal to 90% of their needs ?progressing ? ?MONITOR:  ?Diet advancement, PO intake, Supplement acceptance, Labs, Weight trends, Skin ? ?REASON FOR ASSESSMENT:  ?Consult ?Assessment of nutrition requirement/status ? ?ASSESSMENT:  ?36 year old female with medical history of anxiety, depression, suicide attempt by drug OD, polysubstance abuse, R clavicle fx 8 months ago. She presented to the ED ongoing R shoulder pain. She had gone to another hospital 2 weeks ago and found to have R shoulder abscess which was treated with I&D and IV abx. She was dx with MRSA bacteremia and endocarditis was r/o on TEE on 3/16. She was transferred from [facility] to [facility] due to ongoing positive blood cultures despite treatment. Once at [facility], extensive imaging showed R clavicle osteomyelitis/phlegmon/abscess, widespread cervical,thoracic,lumbar discitis/osteomyelitis/epidural abscess/cord impingement. MRI showed bilateral iliopsoas abscess, R complex pleural effusion. ID, Orthopedics, and Neurosurgery following.  Currently on daptomycin. Plan for transthoracic echo. Neurosurgery planning for lumbar laminectomy for debridement of abscess, L4-L5. ? ?3/25 - s/p lumbar laminectomy/debridement of abscess, L4-L5 ?3/27 - s/p dx laparoscopy, exploratory laparotomy, graham patch repair of gastric ulcer perforation; NGT placed to suction ?3/30 - NGT removed ?3/31 - s/p debridement R clavicle and application of woundVAC ?4/02 - MRI spine redemonstrated osteomyelitis at T11-12 and L5-S1 ? ?Pt found to have abscesses in epidural space of cervical and  lumbar spine in addition to iliopsoas abscess. ? ?Per NP and ID, pt to remain inpatient until completion of IV abx until 10/29/2021 -- pt with h/o of polysubstance abuse.  ? ?Pt sleeping at time of RD visit. Per RN, pt's intake has been more varied since last RD visit -- 0-75% meal completion documented. Will order ONS to provide additional calories and protein for pt.   ? ?UOP: x24 hours ?I/O: - since admit ? ?Medications: protonix, miralax, senokot-S, IV abx ?Labs: ?Recent Labs  ?Lab 09/04/21 ?0252 09/07/21 ?0205  ?NA 131* 134*  ?K 4.0 3.5  ?CL 103 104  ?CO2 22 23  ?BUN 10 7  ?CREATININE 0.34* 0.35*  ?CALCIUM 8.2* 8.2*  ?GLUCOSE 139* 111*  ?Hgb 9.2  ? ?Diet Order:   ?Diet Order   ? ?       ?  DIET SOFT Room service appropriate? Yes; Fluid consistency: Thin  Diet effective now       ?  ? ?  ?  ? ?  ? ?EDUCATION NEEDS:  ?Not appropriate for education at this time ? ?Skin:  Skin Assessment: Skin Integrity Issues: ?Skin Integrity Issues:: Incisions ?Incisions: throat, back, abdomen ? ?Last BM:  4/4 ? ?Height:  ?Ht Readings from Last 1 Encounters:  ?09/02/21 5\' 7"  (1.702 m)  ? ?Weight:  ?Wt Readings from Last 1 Encounters:  ?09/02/21 77.1 kg  ? ?Ideal Body Weight:  61.4 kg ? ?BMI:  Body mass index is 26.63 kg/m?. ? ?Estimated Nutritional Needs:  ?Kcal:  2400-2600 kcal ?Protein:  120-130 grams ?Fluid:  >/= 2.5 L/day ? ? ? ? ?09/04/21., MS, RD, LDN (she/her/hers) ?RD pager number and weekend/on-call pager number located in Amion. ?

## 2021-09-10 DIAGNOSIS — B9562 Methicillin resistant Staphylococcus aureus infection as the cause of diseases classified elsewhere: Secondary | ICD-10-CM | POA: Diagnosis not present

## 2021-09-10 DIAGNOSIS — R7881 Bacteremia: Secondary | ICD-10-CM | POA: Diagnosis not present

## 2021-09-10 LAB — CBC
HCT: 26 % — ABNORMAL LOW (ref 36.0–46.0)
Hemoglobin: 8.3 g/dL — ABNORMAL LOW (ref 12.0–15.0)
MCH: 30.3 pg (ref 26.0–34.0)
MCHC: 31.9 g/dL (ref 30.0–36.0)
MCV: 94.9 fL (ref 80.0–100.0)
Platelets: 413 10*3/uL — ABNORMAL HIGH (ref 150–400)
RBC: 2.74 MIL/uL — ABNORMAL LOW (ref 3.87–5.11)
RDW: 14.6 % (ref 11.5–15.5)
WBC: 10.3 10*3/uL (ref 4.0–10.5)
nRBC: 0 % (ref 0.0–0.2)

## 2021-09-10 MED ORDER — PREGABALIN 25 MG PO CAPS
25.0000 mg | ORAL_CAPSULE | Freq: Three times a day (TID) | ORAL | Status: DC
  Administered 2021-09-10 – 2021-09-19 (×30): 25 mg via ORAL
  Filled 2021-09-10 (×30): qty 1

## 2021-09-10 NOTE — Progress Notes (Signed)
Physical Therapy Treatment ?Patient Details ?Name: Kimberly Booker ?MRN: [id number] ?DOB: 1985/11/25 ?Today's Date: 09/10/2021 ? ? ?History of Present Illness Pt is a 36 y.o. female initially admitted to [facility] 08/15/21 with R shoulder abscess; admit to [facility] 08/26/21 with MRSA bacteremia. 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 S/p L4-5 laminectomy and abscess debridement 3/25. Pt with pleural effusion s/p R pigtail chest tube 3/26. Abdominal CT showed bowel perforation; s/p ex lap with patch repair on 3/27. S/p R psoas abscess drainage on 3/30. S/p R clavicle I&D with wound vac placement 3/31. Pt developed severe LBP on 4/2; MRI showed improvement in abscess. PMH includes anxiety, depression, polysubstance abuse, R clavicle fx ~8 mo prior to admission. ?  ?PT Comments  ? ? Pt with c/o increased abdominal and back pain this session, requiring minA for limited mobility OOB. Requesting verbal order from MD for going outside next week, as pt expected to be admitted through 10/29/21 for IV antibiotics. Will continue to follow acutely to address established goals. ?   ?Recommendations for follow up therapy are one component of a multi-disciplinary discharge planning process, led by the attending physician.  Recommendations may be updated based on patient status, additional functional criteria and insurance authorization. ? ?Follow Up Recommendations ? Home health PT (likely progress to no PT) ?  ?  ?Assistance Recommended at Discharge Intermittent Supervision/Assistance  ?Patient can return home with the following A little help with walking and/or transfers;Assistance with cooking/housework;Assist for transportation;Help with stairs or ramp for entrance;A little help with bathing/dressing/bathroom ?  ?Equipment Recommendations ?  (TBD)  ?  ?Recommendations for Other Services   ? ? ?  ?Precautions / Restrictions Precautions ?Precautions: Fall;Back;Other (comment) ?Precaution Comments: R clavicle  wound vac; abdominal precautions for comfort ?Restrictions ?Weight Bearing Restrictions: Yes ?RUE Weight Bearing: Weight bearing as tolerated  ?  ? ?Mobility ? Bed Mobility ?Overal bed mobility: Modified Independent ?Bed Mobility: Supine to Sit ?  ?  ?  ?  ?  ?  ?  ? ?Transfers ?Overall transfer level: Needs assistance ?Equipment used: 1 person hand held assist ?Transfers: Sit to/from Stand ?Sit to Stand: Min assist ?  ?Step pivot transfers: Min assist ?  ?  ?  ?General transfer comment: multiple unsuccessful trials standing without assist which pt attributes to abdominal pain; able to stand with minA for HHA to pull to stand, pivotal steps to recliner with HHA for balance ?  ? ?Ambulation/Gait ?  ?  ?  ?  ?  ?  ?  ?General Gait Details:  (pt declined secondary to pain) ? ? ?Stairs ?  ?  ?  ?  ?  ? ? ?Wheelchair Mobility ?  ? ?Modified Rankin (Stroke Patients Only) ?  ? ? ?  ?Balance Overall balance assessment: Needs assistance ?Sitting-balance support: Feet supported ?Sitting balance-Leahy Scale: Good ?  ?  ?  ?Standing balance-Leahy Scale: Poor ?Standing balance comment: reliant on HHA this session ?  ?  ?  ?  ?  ?  ?  ?  ?  ?  ?  ?  ? ?  ?Cognition Arousal/Alertness: Awake/alert ?Behavior During Therapy: [facility] for tasks assessed/performed ?Overall Cognitive Status: Within Functional Limits for tasks assessed ?  ?  ?  ?  ?  ?  ?  ?  ?  ?  ?  ?  ?  ?  ?  ?  ?  ?  ?  ? ?  ?Exercises   ? ?  ?  General Comments General comments (skin integrity, edema, etc.): HR 90s with activity. session focused on seated activity due to pain; pt reports no conerns regarding therex, declines standing therex secondary to pain. will ask MD for verbal order to get outside next week to aid with pt's affect being admitted and stuck inside 6-8 wks on iv antiobiotics ?  ?  ? ?Pertinent Vitals/Pain Pain Assessment ?Pain Assessment: Faces ?Faces Pain Scale: Hurts whole lot ?Pain Location: back, abdomen ?Pain Descriptors / Indicators: Sore,  Guarding, Grimacing ?Pain Intervention(s): Monitored during session, Limited activity within patient's tolerance  ? ? ?Home Living   ?  ?  ?  ?  ?  ?  ?  ?  ?  ?   ?  ?Prior Function    ?  ?  ?   ? ?PT Goals (current goals can now be found in the care plan section) Progress towards PT goals: Progressing toward goals ? ?  ?Frequency ? ? ? Min 5X/week ? ? ? ?  ?PT Plan Current plan remains appropriate  ? ? ?Co-evaluation   ?  ?  ?  ?  ? ?  ?AM-PAC PT "6 Clicks" Mobility   ?Outcome Measure ? Help needed turning from your back to your side while in a flat bed without using bedrails?: None ?Help needed moving from lying on your back to sitting on the side of a flat bed without using bedrails?: None ?Help needed moving to and from a bed to a chair (including a wheelchair)?: A Little ?Help needed standing up from a chair using your arms (e.g., wheelchair or bedside chair)?: A Little ?Help needed to walk in hospital room?: A Little ?Help needed climbing 3-5 steps with a railing? : A Little ?6 Click Score: 20 ? ?  ?End of Session   ?Activity Tolerance: Patient limited by pain ?Patient left: in chair;with call bell/phone within reach ?Nurse Communication: Mobility status ?PT Visit Diagnosis: Other abnormalities of gait and mobility (R26.89);Muscle weakness (generalized) (M62.81);Pain ?Pain - Right/Left: Right ?  ? ? ?Time: 3154-0086 ?PT Time Calculation (min) (ACUTE ONLY): 22 min ? ?Charges:  $Therapeutic Activity: 8-22 mins          ? ?          ?Ina Homes, PT, DPT ?Acute Rehabilitation Services  ?Pager [phone] ?Office [phone] ? ?Malachy Chamber ?09/10/2021, 4:44 PM ? ?

## 2021-09-10 NOTE — Progress Notes (Signed)
Pharmacy Antibiotic Note ? ?Kimberly Booker is a 36 y.o. female admitted on 08/26/2021 with  disseminated MRSA bacteremia (diffuse discitis/osteomyelitis/epidural abscess of cervical, thoracic and lumbar spine, bilateral septic arthritis, osteomyelitis of R clavicle, R lung empyema) .  ?  ?Patient was initially started on vancomycin 3/13-3/15 along with ceftriaxone 3/13-3/24. Vancomycin was transitioned to daptomycin and given 3/16-3/25. Pt was transferred to [facility] on 08/26/21. ID has consulted pharmacy dose vancomycin for coverage of MRSA R lung empyema.  ?  ?Patient is s/p placement of IR drains into iliopsoas abscesses 3/30 and debridement of right clavicle 3/31. Scr remains stable.  ? ?Previous 4/03 vancomycin peak 38, trough 12; calculated AUC elevated at 602, vancomycin dose decreased.   ? ?Plan: ?Vancomycin 1500 mg IV q12h (eAUC ~515) ?Goal AUC 400-550 ?Consider repeat levels ~4/10 prior to discharge ?Monitor renal function and clinical signs of infection  ? ?Height: 5\' 7"  (170.2 cm) ?Weight: 77.1 kg (170 lb) ?IBW/kg (Calculated) : 61.6 ? ?Temp (24hrs), Avg:99.3 ?F (37.4 ?C), Min:98.6 ?F (37 ?C), Max:99.8 ?F (37.7 ?C) ? ?Recent Labs  ?Lab 09/04/21 ?0252 09/05/21 ?0231 09/06/21 ?0110 09/06/21 ?[id number] 09/07/21 ?0205 09/08/21 ?[id number] 09/09/21 ?[id number] 09/10/21 ?[id number]  ?WBC 14.3* 16.8*  --   --  14.9* 14.6* 15.1* 10.3  ?CREATININE 0.34*  --   --   --  0.35*  --   --   --   ?[place]  --   --   --  12*  --   --   --   --   ?VANCOPEAK  --   --  38  --   --   --   --   --   ?  ?Estimated Creatinine Clearance: 104.1 mL/min (A) (by C-G formula based on SCr of 0.35 mg/dL (L)).   ? ?Allergies  ?Allergen Reactions  ? Amoxicillin Swelling  ?  Throat closes  ? ? ?Antimicrobials this admission: ?Vanc 3/13 >> 3/15 ?Ceftriaxone 3/13 >> 3/24 ?Daptomycin 3/16 >> 3/25 ?Vanc 3/26 >> ? ?Microbiology results: ?3/12 Bcx x2: 4/4 MRSA ?3/12 Chest wound cx: MRSA ?3/14 Bcx: 4/4 MRSA ?3/22 Bcx: ngtd ?3/24 Bcx: ngtd ?3/25 lumbar epidural  wound/abscess: MRSA ?3/31 fungal cx: negative ?3/31 bone cx: negative ? ? ?Thank you for allowing pharmacy to be a part of this patient?s care. ? ?Ardyth Harps, PharmD ?Clinical Pharmacist ? ? ?

## 2021-09-10 NOTE — Progress Notes (Signed)
Patient ID: Kimberly Booker, female   DOB: 06-20-1985, 36 y.o.   MRN: [id number] ?Patient is 7 days status post debridement of years right clavicle with MRSA infection.  Hank powder was placed within the wound as well as biologic graft.  There is 150 cc in the wound VAC canister patient denies any shoulder pain.  Will plan on discharging with the Praveena plus portable VAC depending on the timing. ?

## 2021-09-10 NOTE — Progress Notes (Signed)
Mobility Specialist: Progress Note ? ? 09/10/21 1223  ?Mobility  ?Activity Transferred to/from [facility]  ?Level of Assistance Minimal assist, patient does 75% or more  ?Assistive Device Front wheel walker  ?RUE Weight Bearing WBAT  ?Distance Ambulated (ft) 2 ft  ?Activity Response Tolerated fair  ?$Mobility charge 1 Mobility  ? ?Pt received in bed and agreeable to mobility. Significantly limited secondary to pain level today in her back and abdomen. Pt contact guard with bed mobility and minA to stand and pivot. Unable to attempt ambulation with c/o pain so assisted pt back to bed. Pt set up with her lunch and all needs met. Pt requesting pain medication, RN notified.  ? ?Kimberly Booker ?Mobility Specialist ?Mobility Specialist [place]: [phone] ?Mobility Specialist [place]: [phone] ? ?

## 2021-09-10 NOTE — Progress Notes (Signed)
[facility] ?PROGRESS NOTE ? ?COMFORT IVERSEN [id number] DOB: [birth year] DOA: 08/26/2021 ?PCP: Pcp, No ? ?Status: ?Remains inpatient appropriate because:  ?Unsafe discharge plan.  Patient with history of polysubstance abuse and therefore deemed inappropriate to discharge with PICC line in place for prolonged IV antibiotics.  ID documents last doses are due on 10/29/2021 ? ?Barriers to discharge: ?Social: ?None-she plans to discharge home with father in the [facility] [place] area ? ?Clinical: ?Continues to require IV antibiotics to treat multiple infections including MRSA bacteremia ? ?Level of care: Progressive ? ? ?Code Status: Full ?Family Communication: Patient only ?DVT prophylaxis: Lovenox ?COVID vaccination status: No immunizations on file ? ?HPI: ?36 year old with past medical history significant for anxiety, depression, suicidal attempt drug overdose, polysubstance abuse, right clavicle fracture, was admitted at [facility] on 08/15/2021 due to right shoulder abscess.She was treated with I&D in the emergency department followed by IV antibiotics therapy as an inpatient.  She was diagnosed with MRSA bacteremia, endocarditis was ruled out with a TEE performed 3/16, but she continued to have positive blood cultures despite treatment with ceftriaxone and vancomycin.  Antibiotic therapy was changed to daptomycin.  She was then transferred to [facility]. ?Extensive imagings have been done here which showed right clavicle osteomyelitis/phlegmon/abscess, widespread cervical, thoracic, lumbar discitis/osteomyelitis/epidural abscess/cord impingement.  MRI also showed bilateral iliopsoas abscess, right complex pleural effusion.  ID, orthopedics, neurosurgery following. ?Underwent lumbar laminectomy/debridement of abscess, L4-L5 on 3/25. Hospital course also remarkable for finding of right-sided empyema, gastric perforation. Status post emergent laparotomy and perforation of repair by general  surgery.  PCCM was also following for chest tube on the right side, which was removed on 3/29. ?Underwent aspiration of the psoas abscesses on 3/30.  ?Underwent debridement of the right clavicle and application of wound VAC to the right shoulder area on 3/31. ?Developed severe lower back pain on 4/2.  MRI was done which showed improvement in the abscess.  Symptoms have improved.. ? ?Subjective: ?Reportin inadequate back pain control-states feels like neuropathic pain she's had before just worse-comes in waves ? ?Objective: ?Vitals:  ? 09/09/21 2016 09/10/21 0030  ?BP: 110/65 105/61  ?Pulse: 91 86  ?Resp: 19 17  ?Temp: 99.6 ?F (37.6 ?C)   ?SpO2:    ? ? ?Intake/Output Summary (Last 24 hours) at 09/10/2021 9326 ?Last data filed at 09/10/2021 0525 ?Gross per 24 hour  ?Intake 2788 ml  ?Output 50 ml  ?Net 2738 ml  ? ?Filed Weights  ? 08/28/21 1059 08/29/21 1417 09/02/21 1010  ?Weight: 86.4 kg 86.4 kg 77.1 kg  ? ? ?Exam: ?Constitutional: Awke -requesting pain med if due ?Respiratory: clear to auscultation bilaterally, no wheezing, no crackles. Normal respiratory effort. No accessory muscle use.  Room air ?Cardiovascular: S1-S2, normotensive, regular pulse, no peripheral edema ?Musculoskeletal: Wound VAC to right anterior shoulder ?Abdomen: no tenderness, no masses palpated. Bowel sounds positive. LBM 4/6 ?Neurologic: CN 2-12 grossly intact. Sensation intact, DTR normal. Strength 5/5 x all 4 extremities.  ?Psychiatric: Normal judgment and insight. Alert and oriented x 3. Normal mood.  ? ? ?Assessment/Plan: ?MRSA bacteremia ?Patient has disseminated MRSA bacteremia.  ?TEE at outside facility on 3/16, negative for endocarditis as per the report  ?TTE did not show any vegetation.  Per ID note dated 4/4 no clear indication for repeat TEE given suspected source control. ?Patient is on vancomycin.  WBC has been fluctuating.  She remains afebrile.  Last blood cultures from 3/24 negative.  ID continues to follow.  ?  Per ID patient will  need a total of 8 weeks of antibiotic therapy starting from date of last surgery on 3/31 with tentative end date 5/26 ?Patient with extremely poor dentition and this could be the source of her bacteremia ?  ?Gastric perforation  ?Complained of abdominal pain on 3/26.  Chest x-ray done after chest tube placement showed incidental finding of pneumoperitoneum.  CT abdomen/pelvis confirmed pneumoperitoneum.  General surgery consulted and she underwent emergent exploratory laparatomy with finding of gastric perforation.  ?Currently tolerating soft diet ?  ?Abscess in epidural space of cervical spine ?Discitis/osteomyelitis at C2-3, C5-6, and C6-7. Ventral epidural abscess at C2-3, C5, and C6 with cord impingement especially at the lower 2 levels. Facet arthritis on the left at C7-T1 and right at C3-4. ?-Also showed discitis/osteomyelitis of T9- T10, T11-T12 ?-Neurosurger seen by neurosurgery previously.  No focal neurological deficits noted  ?  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 Bilateral facet arthritis at L5-S1. Dorsal epidural abscess at L4 and L5,highly compressive on the thecal sac.--S/P  lumbar laminectomy, debridement of abscess, L4-L5 on 3/25.   ?On 4/2 patient was complaining of severe pain in the lower back radiating down to her legs.  MRI lumbar spine was ordered. ?It redemonstrated the osteomyelitis at T11-12 and L5-S1.  Status post L4-L5 laminectomy/debridement with significant decrease in the amount of the abscess.  A small collection was noted posterior to the S1 causing moderate thecal sac narrowing.  Other findings of iliopsoas abscess and sacroiliac septic arthritis were noted as seen previously on MRI pelvis. ?These findings were discussed with neurosurgery.  Patient does not have any focal neurological deficits.  No intervention was recommended. ?Symptoms appear to have improved.  Reason for her symptoms are not  entirely clear.  Continue to monitor.  No focal neurological deficits noted.   ?Will add LD Lyrica for pain med adjunct ?  ?Iliopsoas abscess /Sacroiliac septic arthritis/osteomyelitis ? Pelvic MRI showed bilateral infectious sacroiliitis and osteomyelitis of the sacrum and periarticular right iliac bone. Bilateral psoas abscesses, Small bilateral paraspinal muscle abscesses, intramuscular abscess within the left piriformis muscle, small intramuscular abscess within the left gluteus medius muscle. ?Seen by interventional radiology and underwent aspiration on 3/30.  MRSA noted on cultures.   ?  ?Pleural effusion ?Chest CT showed a large loculated right pleural effusion concerning for empyema, subcentimeter groundglass nodularity within the bilateral lungs concerning for septic emboli.  ?S/P chest tube placement.  Fluid noted to be exudative.  Cultures without any growth.  No need for DNase/tPA.   ?Chest x-ray showed resolution of the effusion.  Chest tube was discontinued on 3/29.  Respiratory status is stable. ?  ?Normocytic anemia ?Hemoglobin low but stable.  ?Received 2 units PRBC at outside facility.  ?Current hemoglobin 8.3 ?  ?Anxiety with depression ?Continue Prozac and Ativan prn ?  ?Acute osteomyelitis of clavicle, right (HCC) ?MRI of the shoulder showed osteomyelitis, phlegmon, possible abscess.  Orthopedics following.  Underwent debridement of right clavicle and application of wound VAC on 3/31.  Cultures pending. ?  ?Polysubstance abuse  ?Patient reports last drug use was 1 year ago. ?  ?Pain management/physical deconditioning ?Patient with MRSA infection at multiple sites.  Subsequently with gastric perforation.  Pain management has been difficult.  She was subsequently started on a fentanyl patch by orthopedics.  Also on Dilaudid intravenously as needed.  Percocet orally as needed.  Continue bowel regimen. ?She is currently reporting adequate pain can ?She  is continuing to work with PT and OT ?Patient  had requested overnight to have IV Robaxin changed to oral.  I have subsequently changed to Robaxin 1000 mg p.o. scheduled every 6 hours ?  ?Transaminitis ?HCV antibody positive ?HCV quantitative was no

## 2021-09-11 DIAGNOSIS — R7881 Bacteremia: Secondary | ICD-10-CM | POA: Diagnosis not present

## 2021-09-11 DIAGNOSIS — M86111 Other acute osteomyelitis, right shoulder: Secondary | ICD-10-CM | POA: Diagnosis not present

## 2021-09-11 DIAGNOSIS — B9562 Methicillin resistant Staphylococcus aureus infection as the cause of diseases classified elsewhere: Secondary | ICD-10-CM | POA: Diagnosis not present

## 2021-09-11 DIAGNOSIS — G061 Intraspinal abscess and granuloma: Secondary | ICD-10-CM

## 2021-09-11 LAB — CBC
HCT: 27.3 % — ABNORMAL LOW (ref 36.0–46.0)
Hemoglobin: 8.6 g/dL — ABNORMAL LOW (ref 12.0–15.0)
MCH: 30.1 pg (ref 26.0–34.0)
MCHC: 31.5 g/dL (ref 30.0–36.0)
MCV: 95.5 fL (ref 80.0–100.0)
Platelets: 411 10*3/uL — ABNORMAL HIGH (ref 150–400)
RBC: 2.86 MIL/uL — ABNORMAL LOW (ref 3.87–5.11)
RDW: 14.5 % (ref 11.5–15.5)
WBC: 9.5 10*3/uL (ref 4.0–10.5)
nRBC: 0 % (ref 0.0–0.2)

## 2021-09-11 NOTE — Progress Notes (Signed)
? ? ? [facility] ?                                                                            ? ? ?Kimberly Booker, is a 36 y.o. female, DOB - March 07, 1986, [id number] ?Admit date - 08/26/2021    ?Outpatient Primary MD for the patient is Pcp, No ? ?LOS - 16  days ? ? ? ?Brief summary  ? ?36 year old with past medical history significant for anxiety, depression, suicidal attempt drug overdose, polysubstance abuse, right clavicle fracture, was admitted at [facility] on 08/15/2021 due to right shoulder abscess.She was treated with I&D in the emergency department followed by IV antibiotics therapy as an inpatient.  She was diagnosed with MRSA bacteremia, endocarditis was ruled out with a TEE performed 3/16, but she continued to have positive blood cultures despite treatment with ceftriaxone and vancomycin.  Antibiotic therapy was changed to daptomycin.  She was then transferred to [facility]. ?Extensive imagings have been done here which showed right clavicle osteomyelitis/phlegmon/abscess, widespread cervical, thoracic, lumbar discitis/osteomyelitis/epidural abscess/cord impingement.  MRI also showed bilateral iliopsoas abscess, right complex pleural effusion.  ID, orthopedics, neurosurgery following. ?Underwent lumbar laminectomy/debridement of abscess, L4-L5 on 3/25. Hospital course also remarkable for finding of right-sided empyema, gastric perforation. Status post emergent laparotomy and perforation of repair by general surgery.  PCCM was also following for chest tube on the right side, which was removed on 3/29. ?Underwent aspiration of the psoas abscesses on 3/30.  ?Underwent debridement of the right clavicle and application of wound VAC to the right shoulder area on 3/31. ?Developed severe lower back pain on 4/2.  MRI was done which showed improvement in the abscess.  Symptoms have improved. ? ? ?Assessment & Plan  ? ? ?Assessment and Plan: ?* MRSA bacteremia ?Patient has disseminated MRSA  bacteremia.  ?She had TEE outside facility on 3/16, negative for endocarditis as per the report  ?TTE did not show any vegetation, might need TEE here but was delayed due emergent surgery for gastric perforation and other surgical procedures as outlined.  Will defer to ID. ?Patient is on vancomycin.  WBC has been fluctuating.  She remains afebrile.  Last blood cultures from 3/24 negative.  ID continues to follow.  ? ?Gastric perforation ([place]) ?Complained of abdominal pain on 3/26.  Chest x-ray done after chest tube placement showed incidental finding of pneumoperitoneum.  CT abdomen/pelvis confirmed pneumoperitoneum.  General surgery consulted and she underwent emergent exploratory laparatomy with finding of gastric perforation.  ?General surgery continues to follow.  Currently on soft diet.  ? ?Abscess in epidural space of cervical spine ?Discitis/osteomyelitis at C2-3, C5-6, and C6-7. Ventral epidural abscess at C2-3, C5, and C6 with cord impingement especially at the lower 2 levels. Facet arthritis on the left at C7-T1 and right at C3-4. ?-Also showed discitis/osteomyelitis of T9- T10, T11-T12 ?-Neurosurger seen by neurosurgery previously.  NO deficits seen.  ?Working with PT.  ? 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 Bilateral facet arthritis at L5-S1. Dorsal epidural abscess at L4 and L5,highly compressive on the thecal sac. ?S/P  lumbar laminectomy, debridement of abscess, L4-L5 on 3/25. ?No further interventions  planned by neurosurgery.   ?On 4/2 patient was complaining of severe pain in the lower back radiating down to her legs.  MRI lumbar spine was ordered. ?It redemonstrated the osteomyelitis at T11-12 and L5-S1.  Noted to be status post L4-L5 laminectomy and debridement with significant decrease in the amount of the abscess.  A small collection was noted posterior to the S1 causing moderate thecal sac narrowing.  Other  findings of iliopsoas abscess and sacroiliac septic arthritis were noted as seen previously on MRI pelvis. ?These findings were discussed with neurosurgery.  Patient does not have any focal neurological deficits.  No intervention was recommended. Today pt denies any new complaints.  ? ? ?Iliopsoas abscess ([place]) ?Sacroiliac septic arthritis/osteomyelitis ? ?Pelvic MRI showed bilateral infectious sacroiliitis and osteomyelitis of the sacrum and periarticular right iliac bone. Bilateral psoas abscesses, Small bilateral paraspinal muscle abscesses, intramuscular abscess within the left piriformis muscle, small intramuscular abscess within the left gluteus medius muscle. ?Seen by interventional radiology and underwent aspiration on 3/30.  MRSA noted on cultures.  ?Complete the course of antibiotics.   ? ?Pleural effusion ?Chest CT showed a large loculated right pleural effusion concerning for empyema, subcentimeter groundglass nodularity within the bilateral lungs concerning for septic emboli.  ?S/P chest tube placement.  Fluid noted to be exudative.  Cultures without any growth.  No need for DNase/tPA.   ?Chest x-ray showed resolution of the effusion.  Chest tube was discontinued on 3/29.  Pt is on RA and stable.  ? ?Normocytic anemia ?Hemoglobin low but stable.  ?Received 2 units PRBC at outside facility.  ? ? ?Anxiety with depression ?Noted to be on Prozac and Ativan as needed. ? ?Acute osteomyelitis of clavicle, right ([place]) ?MRI of the shoulder showed osteomyelitis, phlegmon, possible abscess.  Orthopedics following.  Underwent debridement of right clavicle and application of wound VAC on 3/31.  Cultures pending. ? ? ?Polysubstance abuse ([name]) ?She declined IV drug abuse recently, last drug use was 2 years ago as per patient. ? ?Pain management ?Patient with MRSA infection at multiple sites.  Subsequently with gastric perforation.  Pain management has been difficult.  She was subsequently started on a fentanyl patch  by orthopedics.  Also on Dilaudid intravenously as needed.  Percocet orally as needed.  Continue bowel regimen. ? ?Transaminitis ?HCV antibody positive ?HCV quantitative was not detected. ? ?LFTs noted to be normal on 4/4. ? ?  ? ?Malnutrition Type: ? ?Nutrition Problem: Increased nutrient needs ?Etiology: acute illness, post-op healing, wound healing ? ? ?Malnutrition Characteristics: ? ?Signs/Symptoms: estimated needs ? ? ?Nutrition Interventions: ? ?Interventions: Ensure Enlive (each supplement provides 350kcal and 20 grams of protein), Prostat, Juven, MVI ? ?Estimated body mass index is 26.63 kg/m? as calculated from the following: ?  Height as of this encounter: 5\' 7"  (1.702 m). ?  Weight as of this encounter: 77.1 kg. ? ?Code Status: full code.  ?DVT Prophylaxis:  SCDs Start: 09/03/21 1605 ?enoxaparin (LOVENOX) injection 40 mg Start: 08/29/21 1000 ?Place and maintain sequential compression device Start: 08/28/21 1049 ? ? ?Level of Care: Level of care: Progressive ?Family Communication: none at bedside.  ? ?Disposition Plan:     Remains inpatient appropriate:  IV antibiotics.  ? ?Procedures:  ?EXP LAP.  ? ?Consultants:   ?Gen surgery ?Neuro surgery  ?ID ? ?Antimicrobials:  ? ?Anti-infectives (From admission, onward)  ? ? Start     Dose/Rate Route Frequency Ordered Stop  ? 09/06/21 2200  vancomycin (VANCOREADY) IVPB 1500 mg/300 mL       ?  1,500 mg ?150 mL/hr over 120 Minutes Intravenous Every 12 hours 09/06/21 1257    ? 09/03/21 1355  vancomycin (VANCOCIN) powder  Status:  Discontinued       ?   As needed 09/03/21 1355 09/03/21 1426  ? 09/01/21 2200  vancomycin (VANCOREADY) IVPB 1750 mg/350 mL  Status:  Discontinued       ? 1,750 mg ?175 mL/hr over 120 Minutes Intravenous Every 12 hours 09/01/21 1058 09/06/21 1257  ? 08/30/21 1000  metroNIDAZOLE (FLAGYL) IVPB 500 mg  Status:  Discontinued       ? 500 mg ?100 mL/hr over 60 Minutes Intravenous Every 12 hours 08/30/21 0036 08/30/21 1708  ? 08/30/21 0600   metroNIDAZOLE (FLAGYL) IVPB 500 mg  Status:  Discontinued       ? 500 mg ?100 mL/hr over 60 Minutes Intravenous On call to O.R. 08/30/21 0220 08/30/21 0229  ? 08/30/21 0245  ciprofloxacin (CIPRO) IVPB 400 mg  Stat

## 2021-09-12 DIAGNOSIS — B9562 Methicillin resistant Staphylococcus aureus infection as the cause of diseases classified elsewhere: Secondary | ICD-10-CM | POA: Diagnosis not present

## 2021-09-12 DIAGNOSIS — R7881 Bacteremia: Secondary | ICD-10-CM | POA: Diagnosis not present

## 2021-09-12 DIAGNOSIS — G061 Intraspinal abscess and granuloma: Secondary | ICD-10-CM | POA: Diagnosis not present

## 2021-09-12 DIAGNOSIS — M86111 Other acute osteomyelitis, right shoulder: Secondary | ICD-10-CM | POA: Diagnosis not present

## 2021-09-12 NOTE — Plan of Care (Signed)

## 2021-09-12 NOTE — Progress Notes (Signed)
? ? ? [facility] ?                                                                            ? ? ?Kimberly Booker, is a 36 y.o. female, DOB - Mar 12, 1986, XIH:[id number] ?Admit date - 08/26/2021    ?Outpatient Primary MD for the patient is Pcp, No ? ?LOS - 17  days ? ? ? ?Brief summary  ? ?36 year old with past medical history significant for anxiety, depression, suicidal attempt drug overdose, polysubstance abuse, right clavicle fracture, was admitted at [facility] on 08/15/2021 due to right shoulder abscess.She was treated with I&D in the emergency department followed by IV antibiotics therapy as an inpatient.  She was diagnosed with MRSA bacteremia, endocarditis was ruled out with a TEE performed 3/16, but she continued to have positive blood cultures despite treatment with ceftriaxone and vancomycin.  Antibiotic therapy was changed to daptomycin.  She was then transferred to [facility]. ?Extensive imagings have been done here which showed right clavicle osteomyelitis/phlegmon/abscess, widespread cervical, thoracic, lumbar discitis/osteomyelitis/epidural abscess/cord impingement.  MRI also showed bilateral iliopsoas abscess, right complex pleural effusion.  ID, orthopedics, neurosurgery following. ?Underwent lumbar laminectomy/debridement of abscess, L4-L5 on 3/25. Hospital course also remarkable for finding of right-sided empyema, gastric perforation. Status post emergent laparotomy and perforation of repair by general surgery.  PCCM was also following for chest tube on the right side, which was removed on 3/29. ?Underwent aspiration of the psoas abscesses on 3/30.  ?Underwent debridement of the right clavicle and application of wound VAC to the right shoulder area on 3/31. ?Developed severe lower back pain on 4/2.  MRI was done which showed improvement in the abscess.  Symptoms have improved. ?Pt seen and examined at bedside. Sleeping comfortably.  ? ? ?Assessment & Plan  ? ? ?Assessment and  Plan: ?* MRSA bacteremia ?Patient has disseminated MRSA bacteremia.  ?She had TEE outside facility on 3/16, negative for endocarditis as per the report  ?TTE did not show any vegetation, might need TEE here but was delayed due emergent surgery for gastric perforation and other surgical procedures as outlined.  Will defer to ID. ?Febrile , t max of 100.4 , wbc count wnl. ?Patient is on vancomycin.  .  Last blood cultures from 3/24 negative.  Repeat Blood cultures ordered.  ?ID continues to follow.  ? ?Gastric perforation (HCC) ?Complained of abdominal pain on 3/26.  Chest x-ray done after chest tube placement showed incidental finding of pneumoperitoneum.  CT abdomen/pelvis confirmed pneumoperitoneum.  General surgery consulted and she underwent emergent exploratory laparatomy with finding of gastric perforation.  ?General surgery continues to follow.  Currently on soft diet.  ? ?Abscess in epidural space of cervical spine ?Discitis/osteomyelitis at C2-3, C5-6, and C6-7. Ventral epidural abscess at C2-3, C5, and C6 with cord impingement especially at the lower 2 levels. Facet arthritis on the left at C7-T1 and right at C3-4. ?-Also showed discitis/osteomyelitis of T9- T10, T11-T12 ?-Neurosurger seen by neurosurgery previously.  No deficits evident.  ?Working with PT.  ? 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 Bilateral facet arthritis at L5-S1. Dorsal epidural abscess at L4 and L5,highly compressive  on the thecal sac. ?S/P  lumbar laminectomy, debridement of abscess, L4-L5 on 3/25. ?No further interventions planned by neurosurgery.   ?On 4/2 patient was complaining of severe pain in the lower back radiating down to her legs.  MRI lumbar spine was ordered. ?It redemonstrated the osteomyelitis at T11-12 and L5-S1.  Noted to be status post L4-L5 laminectomy and debridement with significant decrease in the amount of the abscess.  A small  collection was noted posterior to the S1 causing moderate thecal sac narrowing.  Other findings of iliopsoas abscess and sacroiliac septic arthritis were noted as seen previously on MRI pelvis. ?These findings were discussed with neurosurgery.  Patient does not have any focal neurological deficits.  No intervention was recommended.  ?Pain control with dilaudid IV , fentanyl patch. Oxycodone , lyrica, Lidoderm patch. ? ?Iliopsoas abscess (HCC) ?Sacroiliac septic arthritis/osteomyelitis ? ?Pelvic MRI showed bilateral infectious sacroiliitis and osteomyelitis of the sacrum and periarticular right iliac bone. Bilateral psoas abscesses, Small bilateral paraspinal muscle abscesses, intramuscular abscess within the left piriformis muscle, small intramuscular abscess within the left gluteus medius muscle. ?Seen by interventional radiology and underwent aspiration on 3/30.  MRSA noted on cultures.  ?Complete the course of antibiotics.   ? ?Pleural effusion ?Chest CT showed a large loculated right pleural effusion concerning for empyema, subcentimeter groundglass nodularity within the bilateral lungs concerning for septic emboli.  ?S/P chest tube placement.  Fluid noted to be exudative.  Cultures without any growth.  No need for DNase/tPA.   ?Chest x-ray showed resolution of the effusion.  Chest tube was discontinued on 3/29.  Pt is on RA and stable.  ? ?Normocytic anemia ?Hemoglobin stable around 8.  ?Received 2 units PRBC at outside facility.  ? ? ?Anxiety with depression ?Noted to be on Prozac and Ativan as needed. ? ?Acute osteomyelitis of clavicle, right (HCC) ?MRI of the shoulder showed osteomyelitis, phlegmon, possible abscess.  Orthopedics following.  Underwent debridement of right clavicle and application of wound VAC on 3/31.  Cultures pending. ? ? ?Polysubstance abuse (HCC) ?She declined IV drug abuse recently, last drug use was 2 years ago as per patient. ? ?Pain management ?Patient with MRSA infection at multiple  sites.  Subsequently with gastric perforation.  Pain management has been difficult.  She was subsequently started on a fentanyl patch by orthopedics.  Also on Dilaudid intravenously as needed.  Percocet orally as needed.  Continue bowel regimen. ? ?Transaminitis ?HCV antibody positive ?HCV quantitative was not detected. ? ?LFTs noted to be normal on 4/4. ? ?  ? ?Malnutrition Type: ? ?Nutrition Problem: Increased nutrient needs ?Etiology: acute illness, post-op healing, wound healing ? ? ?Malnutrition Characteristics: ? ?Signs/Symptoms: estimated needs ? ? ?Nutrition Interventions: ? ?Interventions: Ensure Enlive (each supplement provides 350kcal and 20 grams of protein), Prostat, Juven, MVI ? ?Estimated body mass index is 26.63 kg/m? as calculated from the following: ?  Height as of this encounter: 5\' 7"  (1.702 m). ?  Weight as of this encounter: 77.1 kg. ? ?Code Status: full code.  ?DVT Prophylaxis:  SCDs Start: 09/03/21 1605 ?enoxaparin (LOVENOX) injection 40 mg Start: 08/29/21 1000 ?Place and maintain sequential compression device Start: 08/28/21 1049 ? ? ?Level of Care: Level of care: Progressive ?Family Communication: none at bedside.  ? ?Disposition Plan:     Remains inpatient appropriate:  IV antibiotics.  ? ?Procedures:  ?EXP LAP.  ? ?Consultants:   ?Gen surgery ?Neuro surgery  ?ID ? ?Antimicrobials:  ? ?Anti-infectives (From admission, onward)  ? ?  Start     Dose/Rate Route Frequency Ordered Stop  ? 09/06/21 2200  vancomycin (VANCOREADY) IVPB 1500 mg/300 mL       ? 1,500 mg ?150 mL/hr over 120 Minutes Intravenous Every 12 hours 09/06/21 1257    ? 09/03/21 1355  vancomycin (VANCOCIN) powder  Status:  Discontinued       ?   As needed 09/03/21 1355 09/03/21 1426  ? 09/01/21 2200  vancomycin (VANCOREADY) IVPB 1750 mg/350 mL  Status:  Discontinued       ? 1,750 mg ?175 mL/hr over 120 Minutes Intravenous Every 12 hours 09/01/21 1058 09/06/21 1257  ? 08/30/21 1000  metroNIDAZOLE (FLAGYL) IVPB 500 mg  Status:   Discontinued       ? 500 mg ?100 mL/hr over 60 Minutes Intravenous Every 12 hours 08/30/21 0036 08/30/21 1708  ? 08/30/21 0600  metroNIDAZOLE (FLAGYL) IVPB 500 mg  Status:  Discontinued       ? 500 mg ?10

## 2021-09-12 NOTE — Progress Notes (Signed)
Occupational Therapy Treatment ?Patient Details ?Name: Kimberly Booker ?MRN: [id number] ?DOB: 11/03/1985 ?Today's Date: 09/12/2021 ? ? ?History of present illness Pt is a 36 y.o. female initially admitted to [facility] 08/15/21 with R shoulder abscess; admit to [facility] 08/26/21 with MRSA bacteremia. 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 S/p L4-5 laminectomy and abscess debridement 3/25. Pt with pleural effusion s/p R pigtail chest tube 3/26. Abdominal CT showed bowel perforation; s/p ex lap with patch repair on 3/27. S/p R psoas abscess drainage on 3/30. S/p R clavicle I&D with wound vac placement 3/31. Pt developed severe LBP on 4/2; MRI showed improvement in abscess. PMH includes anxiety, depression, polysubstance abuse, R clavicle fx ~8 mo prior to admission. ?  ?OT comments ? Pt progressing towards goals, eager to get OOB and complete UB/LB bathing this session. Pt set up - supervision for UB/LB bathing. Educated pt on use of sock aid and reacher for LB dressing, pt able to demonstrate with supervision.  Pt min guard for step pivot transfer to chair with RW. Pt presenting with impairments listed below, will follow acutely. Updating d/c recommendation to [facility] as pt progressing well with ADLs and functional mobility.   ? ?Recommendations for follow up therapy are one component of a multi-disciplinary discharge planning process, led by the attending physician.  Recommendations may be updated based on patient status, additional functional criteria and insurance authorization. ?   ?Follow Up Recommendations ? Home health OT  ?  ?Assistance Recommended at Discharge Intermittent Supervision/Assistance  ?Patient can return home with the following ? Assist for transportation;Help with stairs or ramp for entrance;A little help with walking and/or transfers;A little help with bathing/dressing/bathroom ?  ?Equipment Recommendations ? Tub/shower seat;BSC/3in1  ?  ?Recommendations for Other Services   ? ?   ?Precautions / Restrictions Precautions ?Precautions: Fall;Back;Other (comment) ?Precaution Comments: R clavicle wound vac; abdominal precautions for comfort ?Restrictions ?Weight Bearing Restrictions: Yes ?RUE Weight Bearing: Weight bearing as tolerated  ? ? ?  ? ?Mobility Bed Mobility ?Overal bed mobility: Modified Independent ?Bed Mobility: Sidelying to Sit ?  ?Sidelying to sit: Supervision ?  ?  ?  ?  ?  ? ?Transfers ?Overall transfer level: Needs assistance ?Equipment used: Rolling walker (2 wheels) ?Transfers: Bed to chair/wheelchair/BSC, Sit to/from Stand ?Sit to Stand: Min guard ?  ?  ?Step pivot transfers: Min guard ?  ?  ?  ?  ?  ?Balance Overall balance assessment: Needs assistance ?Sitting-balance support: Feet supported ?Sitting balance-Leahy Scale: Good ?  ?  ?Standing balance support: Single extremity supported, Bilateral upper extremity supported, During functional activity ?Standing balance-Leahy Scale: Poor ?Standing balance comment: reliant on RW external support ?  ?  ?  ?  ?  ?  ?  ?  ?  ?  ?  ?   ? ?ADL either performed or assessed with clinical judgement  ? ?ADL Overall ADL's : Needs assistance/impaired ?  ?  ?Grooming: Oral care;Wash/dry face;Wash/dry hands;Sitting ?Grooming Details (indicate cue type and reason): completed sitting in chair ?Upper Body Bathing: Set up;Sitting ?Upper Body Bathing Details (indicate cue type and reason): completed sitting in chair ?Lower Body Bathing: Set up;Sitting/lateral leans ?Lower Body Bathing Details (indicate cue type and reason): completed sitting in chair ?  ?  ?Lower Body Dressing: Supervision/safety;With adaptive equipment;Sitting/lateral leans ?Lower Body Dressing Details (indicate cue type and reason): socks, with use of sock aid ?Toilet Transfer: Rolling walker (2 wheels);BSC/3in1;Stand-pivot;Min guard ?Toilet Transfer Details (indicate cue type and reason):  simulated to chair ?  ?  ?  ?  ?Functional mobility during ADLs: Min guard;Rolling walker  (2 wheels) ?  ?  ? ?Extremity/Trunk Assessment Upper Extremity Assessment ?Upper Extremity Assessment: Generalized weakness ?  ?Lower Extremity Assessment ?Lower Extremity Assessment: Defer to PT evaluation ?  ?  ?  ? ?Vision   ?Vision Assessment?: No apparent visual deficits ?  ?Perception Perception ?Perception: Not tested ?  ?Praxis Praxis ?Praxis: Not tested ?  ? ?Cognition Arousal/Alertness: Awake/alert ?Behavior During Therapy: [facility] for tasks assessed/performed ?Overall Cognitive Status: Within Functional Limits for tasks assessed ?  ?  ?  ?  ?  ?  ?  ?  ?  ?  ?  ?  ?  ?  ?  ?  ?  ?  ?  ?   ?Exercises   ? ?  ?Shoulder Instructions   ? ? ?  ?General Comments VSS on RA  ? ? ?Pertinent Vitals/ Pain       Pain Assessment ?Pain Assessment: No/denies pain ? ?Home Living   ?  ?  ?  ?  ?  ?  ?  ?  ?  ?  ?  ?  ?  ?  ?  ?  ?  ?  ? ?  ?Prior Functioning/Environment    ?  ?  ?  ?   ? ?Frequency ? Min 3X/week  ? ? ? ? ?  ?Progress Toward Goals ? ?OT Goals(current goals can now be found in the care plan section) ? Progress towards OT goals: Progressing toward goals ? ?Acute Rehab OT Goals ?Patient Stated Goal: none stated ?OT Goal Formulation: With patient ?Time For Goal Achievement: 09/15/21 ?Potential to Achieve Goals: Good ?ADL Goals ?Pt Will Perform Upper Body Dressing: with min guard assist;sitting;bed level ?Pt Will Perform Lower Body Dressing: with min guard assist;sitting/lateral leans;bed level ?Pt Will Transfer to Toilet: with mod assist;stand pivot transfer;bedside commode ?Pt Will Perform Tub/Shower Transfer: with min assist;ambulating;Shower transfer;Tub transfer;shower seat  ?Plan Frequency remains appropriate;Discharge plan needs to be updated   ? ?Co-evaluation ? ? ?   ?  ?  ?  ?  ? ?  ?AM-PAC OT "6 Clicks" Daily Activity     ?Outcome Measure ? ? Help from another person eating meals?: None ?Help from another person taking care of personal grooming?: None ?Help from another person toileting, which includes  using toliet, bedpan, or urinal?: A Little ?Help from another person bathing (including washing, rinsing, drying)?: A Little ?Help from another person to put on and taking off regular upper body clothing?: A Lot ?Help from another person to put on and taking off regular lower body clothing?: A Lot ?6 Click Score: 18 ? ?  ?End of Session Equipment Utilized During Treatment: Gait belt;Rolling walker (2 wheels) ? ?OT Visit Diagnosis: Unsteadiness on feet (R26.81);Other abnormalities of gait and mobility (R26.89);Muscle weakness (generalized) (M62.81);Pain ?  ?Activity Tolerance Patient tolerated treatment well ?  ?Patient Left with call bell/phone within reach;with nursing/sitter in room;in chair;with chair alarm set ?  ?Nurse Communication Mobility status ?  ? ?   ? ?Time: 1751-0258 ?OT Time Calculation (min): 20 min ? ?Charges: OT General Charges ?$OT Visit: 1 Visit ?OT Treatments ?$Self Care/Home Management : 8-22 mins ? ?Alfonzo Beers, OTD, OTR/L ?Acute Rehab ?[phone] ? ? ?[name] ?09/12/2021, 2:51 PM ?

## 2021-09-13 DIAGNOSIS — R7881 Bacteremia: Secondary | ICD-10-CM | POA: Diagnosis not present

## 2021-09-13 DIAGNOSIS — B9562 Methicillin resistant Staphylococcus aureus infection as the cause of diseases classified elsewhere: Secondary | ICD-10-CM | POA: Diagnosis not present

## 2021-09-13 LAB — CBC WITH DIFFERENTIAL/PLATELET
Abs Immature Granulocytes: 0.08 10*3/uL — ABNORMAL HIGH (ref 0.00–0.07)
Basophils Absolute: 0.1 10*3/uL (ref 0.0–0.1)
Basophils Relative: 1 %
Eosinophils Absolute: 0.4 10*3/uL (ref 0.0–0.5)
Eosinophils Relative: 3 %
HCT: 24.1 % — ABNORMAL LOW (ref 36.0–46.0)
Hemoglobin: 7.7 g/dL — ABNORMAL LOW (ref 12.0–15.0)
Immature Granulocytes: 1 %
Lymphocytes Relative: 18 %
Lymphs Abs: 2.1 10*3/uL (ref 0.7–4.0)
MCH: 29.7 pg (ref 26.0–34.0)
MCHC: 32 g/dL (ref 30.0–36.0)
MCV: 93.1 fL (ref 80.0–100.0)
Monocytes Absolute: 0.7 10*3/uL (ref 0.1–1.0)
Monocytes Relative: 6 %
Neutro Abs: 8 10*3/uL — ABNORMAL HIGH (ref 1.7–7.7)
Neutrophils Relative %: 71 %
Platelets: 384 10*3/uL (ref 150–400)
RBC: 2.59 MIL/uL — ABNORMAL LOW (ref 3.87–5.11)
RDW: 14.3 % (ref 11.5–15.5)
WBC: 11.3 10*3/uL — ABNORMAL HIGH (ref 4.0–10.5)
nRBC: 0 % (ref 0.0–0.2)

## 2021-09-13 LAB — BASIC METABOLIC PANEL
Anion gap: 6 (ref 5–15)
BUN: 13 mg/dL (ref 6–20)
CO2: 25 mmol/L (ref 22–32)
Calcium: 8.5 mg/dL — ABNORMAL LOW (ref 8.9–10.3)
Chloride: 104 mmol/L (ref 98–111)
Creatinine, Ser: 0.36 mg/dL — ABNORMAL LOW (ref 0.44–1.00)
GFR, Estimated: >60 mL/min (ref >60)
Glucose, Bld: 100 mg/dL — ABNORMAL HIGH (ref 70–99)
Potassium: 4.3 mmol/L (ref 3.5–5.1)
Sodium: 135 mmol/L (ref 135–145)

## 2021-09-13 LAB — VANCOMYCIN, TROUGH: Vancomycin Tr: 9 ug/mL — ABNORMAL LOW (ref 15–20)

## 2021-09-13 LAB — VANCOMYCIN, PEAK: Vancomycin Pk: 31 ug/mL (ref 30–40)

## 2021-09-13 NOTE — Progress Notes (Signed)
Pharmacy Antibiotic Note ? ?Kimberly Booker is a 36 y.o. female admitted on 08/26/2021 with  disseminated MRSA bacteremia (diffuse discitis/osteomyelitis/epidural abscess of cervical, thoracic and lumbar spine, bilateral septic arthritis, osteomyelitis of R clavicle, R lung empyema) .  ?  ?Patient was initially started on vancomycin 3/13-3/15 along with ceftriaxone 3/13-3/24. Vancomycin was transitioned to daptomycin and given 3/16-3/25. Pt was transferred to [facility] on 08/26/21. ID has consulted pharmacy dose vancomycin for coverage of MRSA R lung empyema.  ?  ?Patient is s/p placement of IR drains into iliopsoas abscesses 3/30 and debridement of right clavicle 3/31. Scr remains stable.  ? ?Previous 4/03 vancomycin peak 38, trough 12; calculated AUC elevated at 602, vancomycin dose decreased.   ? ?4/10: vancomycin peak 31, trough 9, calculated AUC ~462 >> therapeutic regimen ? ?Plan: ?Continue Vancomycin 1500 mg IV q12h with therapeutic AUC ~462. ?Goal AUC 400-550 ?Monitor renal function and clinical signs of infection  ? ?Height: 5\' 7"  (170.2 cm) ?Weight: 77.1 kg (170 lb) ?IBW/kg (Calculated) : 61.6 ? ?Temp (24hrs), Avg:98.5 ?F (36.9 ?C), Min:98 ?F (36.7 ?C), Max:98.9 ?F (37.2 ?C) ? ?Recent Labs  ?Lab 09/07/21 ?0205 09/08/21 ?0322 09/09/21 ?11/09/21 09/10/21 ?11/10/21 09/11/21 ?0152 09/13/21 ?11/13/21 09/13/21 ?11/13/21  ?WBC 14.9* 14.6* 15.1* 10.3 9.5 11.3*  --   ?CREATININE 0.35*  --   --   --   --  0.36*  --   ?VANCOTROUGH  --   --   --   --   --   --  9*  ?VANCOPEAK  --   --   --   --   --  31  --   ? ?  ?Estimated Creatinine Clearance: 104.1 mL/min (A) (by C-G formula based on SCr of 0.36 mg/dL (L)).   ? ?Allergies  ?Allergen Reactions  ? Amoxicillin Swelling  ?  Throat closes  ? ? ?Antimicrobials this admission: ?Vanc 3/13 >> 3/15 ?Ceftriaxone 3/13 >> 3/24 ?Daptomycin 3/16 >> 3/25 ?Vanc 3/26 >> ? ?Microbiology results: ?3/12 Bcx x2: 4/4 MRSA ?3/12 Chest wound cx: MRSA ?3/14 Bcx: 4/4 MRSA ?3/22 Bcx: ngtd ?3/24 Bcx: ngtd ?3/25  lumbar epidural wound/abscess: MRSA ?3/31 fungal cx: negative ?3/31 bone cx: negative ? ? ?Thank you for allowing pharmacy to be a part of this patient?s care. ? ?4/31, PharmD, BCPS ?Clinical Pharmacist ?09/13/2021 10:54 AM  ? ?Please refer to [facility] for pharmacy phone number  ?

## 2021-09-13 NOTE — Progress Notes (Signed)
[facility] ?PROGRESS NOTE ? ?Kimberly Booker:[id number] DOB: May 29, 1986 DOA: 08/26/2021 ?PCP: Pcp, No ? ?Status: ?Remains inpatient appropriate because:  ?Unsafe discharge plan.  Patient with history of polysubstance abuse and therefore deemed inappropriate to discharge with PICC line in place for prolonged IV antibiotics.  ID documents last doses are due on 10/29/2021 ? ?Barriers to discharge: ?Social: ?None-she plans to discharge home with father in the [facility] [place] area ? ?Clinical: ?Continues to require IV antibiotics to treat multiple infections including MRSA bacteremia ? ?Level of care: Progressive ? ? ?Code Status: Full ?Family Communication: Patient only ?DVT prophylaxis: Lovenox ?COVID vaccination status: No immunizations on file ? ?HPI: ?36 year old with past medical history significant for anxiety, depression, suicidal attempt drug overdose, polysubstance abuse, right clavicle fracture, was admitted at [facility] on 08/15/2021 due to right shoulder abscess.She was treated with I&D in the emergency department followed by IV antibiotics therapy as an inpatient.  She was diagnosed with MRSA bacteremia, endocarditis was ruled out with a TEE performed 3/16, but she continued to have positive blood cultures despite treatment with ceftriaxone and vancomycin.  Antibiotic therapy was changed to daptomycin.  She was then transferred to [facility]. ?Extensive imagings have been done here which showed right clavicle osteomyelitis/phlegmon/abscess, widespread cervical, thoracic, lumbar discitis/osteomyelitis/epidural abscess/cord impingement.  MRI also showed bilateral iliopsoas abscess, right complex pleural effusion.  ID, orthopedics, neurosurgery following. ?Underwent lumbar laminectomy/debridement of abscess, L4-L5 on 3/25. Hospital course also remarkable for finding of right-sided empyema, gastric perforation. Status post emergent laparotomy and perforation of repair by general  surgery.  PCCM was also following for chest tube on the right side, which was removed on 3/29. ?Underwent aspiration of the psoas abscesses on 3/30.  ?Underwent debridement of the right clavicle and application of wound VAC to the right shoulder area on 3/31. ?Developed severe lower back pain on 4/2.  MRI was done which showed improvement in the abscess.  Symptoms have improved.. ? ?Subjective: ?Sleeping soundly and did not awaken. ? ?Objective: ?Vitals:  ? 09/12/21 2116 09/13/21 0426  ?BP: 104/62 110/69  ?Pulse: 93 85  ?Resp: 14 14  ?Temp: 98.9 ?F (37.2 ?C) 98.3 ?F (36.8 ?C)  ?SpO2: 100% 98%  ? ? ?Intake/Output Summary (Last 24 hours) at 09/13/2021 0834 ?Last data filed at 09/12/2021 1600 ?Gross per 24 hour  ?Intake 600 ml  ?Output --  ?Net 600 ml  ? ?Filed Weights  ? 08/28/21 1059 08/29/21 1417 09/02/21 1010  ?Weight: 86.4 kg 86.4 kg 77.1 kg  ? ? ?Exam: ?Constitutional: Sleeping, no acute distress noted while asleep ?Respiratory: clear to auscultation bilaterally, no wheezing, no crackles. Normal respiratory effort. No accessory muscle use.  Room air ?Cardiovascular: S1-S2, normotensive, regular pulse, no peripheral edema ?Musculoskeletal: Wound VAC to right anterior shoulder ?Abdomen: no tenderness, no masses palpated. Bowel sounds positive. LBM 4/8 ?Neurologic: Sleeping but at baseline CN 2-12 grossly intact. Sensation intact, DTR normal. Strength 5/5 x all 4 extremities.  ?Psychiatric: Sleeping but typically has normal judgment and insight. Alert and oriented x 3. Normal mood.  ? ? ?Assessment/Plan: ?MRSA bacteremia ?Patient has disseminated MRSA bacteremia.  ?TEE at outside facility on 3/16, negative for endocarditis as per the report  ?TTE did not show any vegetation.  Per ID note dated 4/4 no clear indication for repeat TEE given suspected source control. ?Patient is on vancomycin.  WBC has been fluctuating.  She remains afebrile.  Last blood cultures from 3/24 negative.  ID continues to follow.  ?  Per ID  patient will need a total of 8 weeks of antibiotic therapy starting from date of last surgery on 3/31 with tentative end date 5/26-of note blood cultures negative x2 on 3/24.  Repeat blood cultures obtained on 4/9 ?Patient with extremely poor dentition and this could be the source of her bacteremia ?  ?Gastric perforation  ?Complained of abdominal pain on 3/26.  Chest x-ray done after chest tube placement showed incidental finding of pneumoperitoneum.  CT abdomen/pelvis confirmed pneumoperitoneum.  General surgery consulted and she underwent emergent exploratory laparatomy with finding of gastric perforation.  ?Currently tolerating soft diet ?  ?Abscess in epidural space of cervical spine ?Discitis/osteomyelitis at C2-3, C5-6, and C6-7. Ventral epidural abscess at C2-3, C5, and C6 with cord impingement especially at the lower 2 levels. Facet arthritis on the left at C7-T1 and right at C3-4. ?-Also showed discitis/osteomyelitis of T9- T10, T11-T12 ?-Neurosurger seen by neurosurgery previously.  No focal neurological deficits noted  ?  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 Bilateral facet arthritis at L5-S1. Dorsal epidural abscess at L4 and L5,highly compressive on the thecal sac.--S/P  lumbar laminectomy, debridement of abscess, L4-L5 on 3/25.   ?On 4/2 patient was complaining of severe pain in the lower back radiating down to her legs.  MRI lumbar spine was ordered. ?It redemonstrated the osteomyelitis at T11-12 and L5-S1.  Status post L4-L5 laminectomy/debridement with significant decrease in the amount of the abscess.  A small collection was noted posterior to the S1 causing moderate thecal sac narrowing.  Other findings of iliopsoas abscess and sacroiliac septic arthritis were noted as seen previously on MRI pelvis. ?These findings were discussed with neurosurgery.  Patient does not have any focal neurological deficits.  No  intervention was recommended. ?Symptoms appear to have improved.  Reason for her symptoms are not entirely clear.  Continue to monitor.  No focal neurological deficits noted.   ?Will add LD Lyrica for pain med adjunct ?  ?Iliopsoas abscess /Sacroiliac septic arthritis/osteomyelitis ? Pelvic MRI showed bilateral infectious sacroiliitis and osteomyelitis of the sacrum and periarticular right iliac bone. Bilateral psoas abscesses, Small bilateral paraspinal muscle abscesses, intramuscular abscess within the left piriformis muscle, small intramuscular abscess within the left gluteus medius muscle. ?Seen by interventional radiology and underwent aspiration on 3/30.  MRSA noted on cultures.   ?  ?Pleural effusion ?Chest CT showed a large loculated right pleural effusion concerning for empyema, subcentimeter groundglass nodularity within the bilateral lungs concerning for septic emboli.  ?S/P chest tube placement.  Fluid noted to be exudative.  Cultures without any growth.  No need for DNase/tPA.   ?Chest x-ray showed resolution of the effusion.  Chest tube was discontinued on 3/29.  Respiratory status is stable. ?  ?Normocytic anemia ?Hemoglobin low but stable.  ?Received 2 units PRBC at outside facility.  ?Current hemoglobin 8.3 ?  ?Anxiety with depression ?Continue Prozac and Ativan prn ?  ?Acute osteomyelitis of clavicle, right (HCC) ?MRI of the shoulder showed osteomyelitis, phlegmon, possible abscess.  Orthopedics following.  Underwent debridement of right clavicle and application of wound VAC on 3/31.  Cultures pending. ?  ?Polysubstance abuse  ?Patient reports last drug use was 1 year ago. ?  ?Pain management/physical deconditioning ?Patient with MRSA infection at multiple sites.  Subsequently with gastric perforation.  Pain management has been difficult.  She was subsequently started on a fentanyl patch by orthopedics.  Also on Dilaudid intravenously as needed.  Percocet orally  as needed.  Continue bowel  regimen. ?She is currently reporting adequate pain can ?She is continuing to work with PT and OT ?Patient had requested overnight to have IV Robaxin changed to oral.  I have subsequently changed to Robaxin 1000 mg p.o.

## 2021-09-13 NOTE — Progress Notes (Signed)
Physical Therapy Treatment ?Patient Details ?Name: Kimberly Booker ?MRN: [id number] ?DOB: 03/03/1986 ?Today's Date: 09/13/2021 ? ? ?History of Present Illness Pt is a 36 y.o. female initially admitted to [facility] 08/15/21 with R shoulder abscess; admit to [facility] 08/26/21 with MRSA bacteremia. 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 S/p L4-5 laminectomy and abscess debridement 3/25. Pt with pleural effusion s/p R pigtail chest tube 3/26. Abdominal CT showed bowel perforation; s/p ex lap with patch repair on 3/27. S/p R psoas abscess drainage on 3/30. S/p R clavicle I&D with wound vac placement 3/31. Pt developed severe LBP on 4/2; MRI showed improvement in abscess. PMH includes anxiety, depression, polysubstance abuse, R clavicle fx ~8 mo prior to admission. ? ?  ?PT Comments  ? ? Pt received supine and agreeable to session despite increased c/o back pain. Pt requiring cueing to come to side lying before sitting or supine for pain management. Pt requiring min guard to stand from elevated EOB and for ambulation with rollator. Ambulation distance limited to pt stated tolerance. Pt continues to remain motivated and benefit from skilled PT services to progress toward functional mobility goals.  ?  ?Recommendations for follow up therapy are one component of a multi-disciplinary discharge planning process, led by the attending physician.  Recommendations may be updated based on patient status, additional functional criteria and insurance authorization. ? ?Follow Up Recommendations ? Home health PT ?  ?  ?Assistance Recommended at Discharge Intermittent Supervision/Assistance  ?Patient can return home with the following A little help with walking and/or transfers;Assistance with cooking/housework;Assist for transportation;Help with stairs or ramp for entrance;A little help with bathing/dressing/bathroom ?  ?Equipment Recommendations ? Rolling walker (2 wheels)  ?  ?Recommendations for Other Services    ? ? ?  ?Precautions / Restrictions Precautions ?Precautions: Fall;Back;Other (comment) ?Precaution Comments: R clavicle wound vac; abdominal precautions for comfort ?Restrictions ?Weight Bearing Restrictions: Yes ?RUE Weight Bearing: Weight bearing as tolerated  ?  ? ?Mobility ? Bed Mobility ?Overal bed mobility: Modified Independent ?Bed Mobility: Sidelying to Sit, Sit to Sidelying ?  ?Sidelying to sit: Supervision ?  ?  ?Sit to sidelying: Min assist ?General bed mobility comments: return to supine with bed flat, no physical assist, only for cues to come to sidelying to before supine ?  ? ?Transfers ?Overall transfer level: Needs assistance ?Equipment used: Rollator (4 wheels) ?Transfers: Sit to/from Stand ?Sit to Stand: Min guard, From elevated surface ?  ?  ?  ?  ?  ?General transfer comment: pt requesting elevated EOB, no physical assist to come to standing. ?  ? ?Ambulation/Gait ?Ambulation/Gait assistance: Min guard ?Gait Distance (Feet): 120 Feet ?Assistive device: Rollator (4 wheels) ?Gait Pattern/deviations: Step-through pattern, Decreased stride length, Antalgic, Trunk flexed ?Gait velocity: Decreased ?  ?  ?General Gait Details: Slow, guarded gait with rollator and intermittent min guard for balance; no knee instability noted this session but pt stating her knee felt weak toward end of ambulation; distnace limited to pt stated tolerance ? ? ?Stairs ?  ?  ?  ?  ?  ? ? ?Wheelchair Mobility ?  ? ?Modified Rankin (Stroke Patients Only) ?  ? ? ?  ?Balance Overall balance assessment: Needs assistance ?Sitting-balance support: Feet supported ?Sitting balance-Leahy Scale: Good ?  ?  ?Standing balance support: Single extremity supported, Bilateral upper extremity supported, During functional activity ?Standing balance-Leahy Scale: Poor ?Standing balance comment: reliant on RW external support ?  ?  ?  ?  ?  ?  ?  ?  ?  ?  ?  ?  ? ?  ?  Cognition Arousal/Alertness: Awake/alert ?Behavior During Therapy: [facility] for tasks  assessed/performed ?Overall Cognitive Status: Within Functional Limits for tasks assessed ?  ?  ?  ?  ?  ?  ?  ?  ?  ?  ?  ?  ?  ?  ?  ?  ?  ?  ?  ? ?  ?Exercises   ? ?  ?General Comments General comments (skin integrity, edema, etc.): VSS on RA ?  ?  ? ?Pertinent Vitals/Pain Pain Assessment ?Pain Assessment: Faces ?Faces Pain Scale: Hurts even more ?Pain Location: back, abdomen ?Pain Descriptors / Indicators: Sore, Guarding, Grimacing ?Pain Intervention(s): Limited activity within patient's tolerance, Monitored during session, Repositioned, RN gave pain meds during session  ? ? ?Home Living   ?  ?  ?  ?  ?  ?  ?  ?  ?  ?   ?  ?Prior Function    ?  ?  ?   ? ?PT Goals (current goals can now be found in the care plan section) Acute Rehab PT Goals ?PT Goal Formulation: With patient ?Time For Goal Achievement: 09/14/21 ? ?  ?Frequency ? ? ? Min 5X/week ? ? ? ?  ?PT Plan Current plan remains appropriate  ? ? ?Co-evaluation   ?  ?  ?  ?  ? ?  ?AM-PAC PT "6 Clicks" Mobility   ?Outcome Measure ? Help needed turning from your back to your side while in a flat bed without using bedrails?: None ?Help needed moving from lying on your back to sitting on the side of a flat bed without using bedrails?: None ?Help needed moving to and from a bed to a chair (including a wheelchair)?: A Little ?Help needed standing up from a chair using your arms (e.g., wheelchair or bedside chair)?: A Little ?Help needed to walk in hospital room?: A Little ?Help needed climbing 3-5 steps with a railing? : A Little ?6 Click Score: 20 ? ?  ?End of Session Equipment Utilized During Treatment: Gait belt ?Activity Tolerance: Patient limited by pain ?Patient left: in bed;with call bell/phone within reach;with nursing/sitter in room ?Nurse Communication: Mobility status ?PT Visit Diagnosis: Other abnormalities of gait and mobility (R26.89);Muscle weakness (generalized) (M62.81);Pain ?Pain - Right/Left: Right ?Pain - part of body: Shoulder ?  ? ? ?Time:  [id number] ?PT Time Calculation (min) (ACUTE ONLY): 20 min ? ?Charges:  $Therapeutic Exercise: 8-22 mins          ?          ?  ?Audry Riles. PTA ?Acute Rehabilitation Services ?Office: [phone] ? ? ? ?Betsey Holiday Ellizabeth Dacruz ?09/13/2021, 12:59 PM ? ?

## 2021-09-14 NOTE — Plan of Care (Signed)

## 2021-09-14 NOTE — Progress Notes (Signed)
[facility] ?PROGRESS NOTE ? ?Kimberly PotashDiana G Kriegel WUJ:[id number] DOB: [birth year] DOA: 08/26/2021 ?PCP: Pcp, No ? ?Status: ?Remains inpatient appropriate because:  ?Unsafe discharge plan.  Patient with history of polysubstance abuse and therefore deemed inappropriate to discharge with PICC line in place for prolonged IV antibiotics.  ID documents last doses are due on 10/29/2021 ? ?Barriers to discharge: ?Social: ?None-[place] ? ?Clinical: ?Continues to require IV antibiotics to treat multiple infections including MRSA bacteremia ? ?Level of care: Progressive ? ? ?Code Status: Full ?Family Communication: Patient only ?DVT prophylaxis: Lovenox ?COVID vaccination status: No immunizations on file ? ?HPI: ?36 year old with past medical history significant for anxiety, depression, suicidal attempt drug overdose, polysubstance abuse, right clavicle fracture, was admitted at [facility] on 08/15/2021 due to right shoulder abscess.She was treated with I&D in the emergency department followed by IV antibiotics therapy as an inpatient.  She was diagnosed with MRSA bacteremia, endocarditis was ruled out with a TEE performed 3/16, but she continued to have positive blood cultures despite treatment with ceftriaxone and vancomycin.  Antibiotic therapy was changed to daptomycin.  [facility] was [facility]. ?Extensive imagings have been done here which showed right clavicle osteomyelitis/phlegmon/abscess, widespread cervical, thoracic, lumbar discitis/osteomyelitis/epidural abscess/cord impingement.  MRI also showed bilateral iliopsoas abscess, right complex pleural effusion.  ID, orthopedics, neurosurgery following. ?Underwent lumbar laminectomy/debridement of abscess, L4-L5 on 3/25. Hospital course also remarkable for finding of right-sided empyema, gastric perforation. Status post emergent laparotomy and perforation of repair by general  surgery.  PCCM was also following for chest tube on the right side, which was removed on 3/29. ?Underwent aspiration of the psoas abscesses on 3/30.  ?Underwent debridement of the right clavicle and application of wound VAC to the right shoulder area on 3/31. ?Developed severe lower back pain on 4/2.  MRI was done which showed improvement in the abscess.  Symptoms have improved.. ? ?Subjective: ?Reports improved pain while laying in the bed and does better ambulating and working with therapy once maximum effect of pain medications have been reached.  Does not need any additional pain medications.  States has significant pain while working with PT but not as bad as previous. ? ?Objective: ?Vitals:  ? 09/13/21 1925 09/14/21 0414  ?BP: (!) 90/52 116/70  ?Pulse: 82 92  ?Resp: 17 20  ?Temp: 98.1 ?F (36.7 ?C) 98.2 ?F (36.8 ?C)  ?SpO2: 100% 100%  ? ? ?Intake/Output Summary (Last 24 hours) at 09/14/2021 0811 ?Last data filed at 09/14/2021 0300 ?Gross per 24 hour  ?Intake --  ?Output 300 ml  ?Net -300 ml  ? ?Filed Weights  ? 08/28/21 1059 08/29/21 1417 09/02/21 1010  ?Weight: 86.4 kg 86.4 kg 77.1 kg  ? ? ?Exam: ?Constitutional:  ?Respiratory: clear to auscultation bilaterally, no wheezing, no crackles. Normal respiratory effort. No accessory muscle use.  Room air ?Cardiovascular: S1-S2, normotensive, regular pulse, no peripheral edema ?Musculoskeletal: Wound VAC to right anterior shoulder ?Abdomen: no tenderness, no masses palpated. Bowel sounds positive. LBM 4/9 ?Neurologic: CN 2-12 grossly intact. Sensation intact, DTR normal. Strength 5/5 x all 4 extremities.  ?Psychiatric: normal judgment and insight. Alert and oriented x 3. Normal mood.  ? ? ?Assessment/Plan: ?MRSA bacteremia ?Patient has disseminated MRSA bacteremia.  ?TEE at outside facility on 3/16, negative for endocarditis as per the report  ?TTE did not show any vegetation.  Per ID note dated 4/4 no clear indication for repeat TEE  given suspected source  control. ?Patient is on vancomycin.  WBC has been fluctuating.  She remains afebrile.  Last blood cultures from 3/24 negative.  ID continues to follow.  ?Per ID patient will need a total of 8 weeks of antibiotic therapy starting from date of last surgery on 3/31 with tentative end date 5/26-of note blood cultures negative x2 on 3/24.  Repeat blood cultures obtained on 4/9 and are pending but no growth to date ?Patient with extremely poor dentition and this could be the source of her bacteremia ?  ?Gastric perforation  ?Complained of abdominal pain on 3/26.  Chest x-ray done after chest tube placement showed incidental finding of pneumoperitoneum.  CT abdomen/pelvis confirmed pneumoperitoneum.  General surgery consulted and she underwent emergent exploratory laparatomy with finding of gastric perforation.  ?Currently tolerating soft diet ?  ?Abscess in epidural space of cervical spine ?Discitis/osteomyelitis at C2-3, C5-6, and C6-7. Ventral epidural abscess at C2-3, C5, and C6 with cord impingement especially at the lower 2 levels. Facet arthritis on the left at C7-T1 and right at C3-4. ?-Also showed discitis/osteomyelitis of T9- T10, T11-T12 ?-Neurosurger seen by neurosurgery previously.  No focal neurological deficits noted  ?  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 Bilateral facet arthritis at L5-S1. Dorsal epidural abscess at L4 and L5,highly compressive on the thecal sac.--S/P  lumbar laminectomy, debridement of abscess, L4-L5 on 3/25.   ?On 4/2 patient was complaining of severe pain in the lower back radiating down to her legs.  MRI lumbar spine was ordered. ?It redemonstrated the osteomyelitis at T11-12 and L5-S1.  Status post L4-L5 laminectomy/debridement with significant decrease in the amount of the abscess.  A small collection was noted posterior to the S1 causing moderate thecal sac narrowing.  Other findings of iliopsoas  abscess and sacroiliac septic arthritis were noted as seen previously on MRI pelvis. ?These findings were discussed with neurosurgery.  Patient does not have any focal neurological deficits.  No intervention was recommended. ?Symptoms appear to have improved.  Reason for her symptoms are not entirely clear.  Continue to monitor.  No focal neurological deficits noted.   ?Will add LD Lyrica for pain med adjunct ?Reports adequate pain control at rest and primarily pain is with mobilization.  Will discuss with therapy to see if there is a supportive device/brace that can be used to decrease low back pain while ambulating ?  ?Iliopsoas abscess /Sacroiliac septic arthritis/osteomyelitis ? Pelvic MRI showed bilateral infectious sacroiliitis and osteomyelitis of the sacrum and periarticular right iliac bone. Bilateral psoas abscesses, Small bilateral paraspinal muscle abscesses, intramuscular abscess within the left piriformis muscle, small intramuscular abscess within the left gluteus medius muscle. ?Seen by interventional radiology and underwent aspiration on 3/30.  MRSA noted on cultures.   ?  ?Pleural effusion ?Chest CT showed a large loculated right pleural effusion concerning for empyema, subcentimeter groundglass nodularity within the bilateral lungs concerning for septic emboli.  ?S/P chest tube placement.  Fluid noted to be exudative.  Cultures without any growth.  No need for DNase/tPA.   ?Chest x-ray showed resolution of the effusion.  Chest tube was discontinued on 3/29.  Respiratory status is stable. ?  ?Normocytic anemia ?Hemoglobin low but stable.  ?Received 2 units PRBC at outside facility.  ?Current hemoglobin 8.3 ?  ?Anxiety with depression ?Continue Prozac and Ativan prn ?  ?Acute osteomyelitis of clavicle, right (HCC) ?MRI of the shoulder showed osteomyelitis, phlegmon, possible abscess.  Orthopedics following.  Underwent debridement of right clavicle and application of wound VAC on 3/31.  Cultures  pending. ?  ?Polysubstance abuse  ?Patient reports last drug use was 1 year ago. ?  ?Pain management/physical deconditioning ?Patient with MRSA infection at multiple sites.  Subsequently with gastric perforation.  Pa

## 2021-09-14 NOTE — Progress Notes (Signed)
Physical Therapy Treatment ?Patient Details ?Name: Kimberly Booker ?MRN: [id number] ?DOB: 1985-12-14 ?Today's Date: 09/14/2021 ? ? ?History of Present Illness Pt is a 36 y.o. female initially admitted to [facility] 08/15/21 with R shoulder abscess; admit to [facility] 08/26/21 with MRSA bacteremia. 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 S/p L4-5 laminectomy and abscess debridement 3/25. Pt with pleural effusion s/p R pigtail chest tube 3/26. Abdominal CT showed bowel perforation; s/p ex lap with patch repair on 3/27. S/p R psoas abscess drainage on 3/30. S/p R clavicle I&D with wound vac placement 3/31. Pt developed severe LBP on 4/2; MRI showed improvement in abscess. PMH includes anxiety, depression, polysubstance abuse, R clavicle fx ~8 mo prior to admission. ? ?  ?PT Comments  ? ? Pt received supine and agreeable to session with good progress towards goals. Pt able to come to sitting EOB without physical assist utilizing log roll technique. Pt requiring min a to come to standing from EOB with HHA x1 to step pivot to [facility]. Pt with overall increased tolerance for ambulation this session needing grossly min guard and x1 seated rest break. Pt requiring cues for log roll technique upon return to bed. Verbally reviewed HEP handout and pt stating intermittent compliance. Encouraged pt to perform in PM. Pt continues to benefit from skilled PT services to progress toward functional mobility goals.  ?  ?Recommendations for follow up therapy are one component of a multi-disciplinary discharge planning process, led by the attending physician.  Recommendations may be updated based on patient status, additional functional criteria and insurance authorization. ? ?Follow Up Recommendations ? Home health PT ?  ?  ?Assistance Recommended at Discharge Intermittent Supervision/Assistance  ?Patient can return home with the following A little help with walking and/or transfers;Assistance with cooking/housework;Assist  for transportation;Help with stairs or ramp for entrance;A little help with bathing/dressing/bathroom ?  ?Equipment Recommendations ? Rolling walker (2 wheels)  ?  ?Recommendations for Other Services   ? ? ?  ?Precautions / Restrictions Precautions ?Precautions: Fall;Back;Other (comment) ?Precaution Comments: R clavicle wound vac; abdominal precautions for comfort ?Restrictions ?Weight Bearing Restrictions: Yes ?RUE Weight Bearing: Weight bearing as tolerated  ?  ? ?Mobility ? Bed Mobility ?Overal bed mobility: Modified Independent ?Bed Mobility: Sidelying to Sit, Sit to Sidelying ?  ?Sidelying to sit: Supervision ?  ?  ?Sit to sidelying: Min assist ?General bed mobility comments: return to supine with bed flat, no physical assist, only for cues to come to sidelying to before supine ?  ? ?Transfers ?Overall transfer level: Needs assistance ?Equipment used: Rollator (4 wheels), 1 person hand held assist ?Transfers: Sit to/from Stand, Bed to chair/wheelchair/BSC ?Sit to Stand: Min guard, From elevated surface, Min assist ?Stand pivot transfers: Min assist ?  ?  ?  ?  ?General transfer comment: HHA to stand pivot transfer to [facility], pt requesting elevated EOB, min assist to stand from low bench in hall ?  ? ?Ambulation/Gait ?Ambulation/Gait assistance: Min guard ?Gait Distance (Feet): 120 Feet ?Assistive device: Rollator (4 wheels) ?Gait Pattern/deviations: Step-through pattern, Decreased stride length, Antalgic, Trunk flexed ?Gait velocity: Decreased ?  ?  ?General Gait Details: Slow, guarded gait with rollator and intermittent min guard for balance; no knee instability noted this session but pt stating her knee felt weak toward end of ambulation; distnace limited to pt stated tolerance, seated recovery break at halfway point on bench in hall ? ? ?Stairs ?  ?  ?  ?  ?  ? ? ?Wheelchair  Mobility ?  ? ?Modified Rankin (Stroke Patients Only) ?  ? ? ?  ?Balance Overall balance assessment: Needs assistance ?Sitting-balance  support: Feet supported ?Sitting balance-Leahy Scale: Good ?  ?  ?Standing balance support: Single extremity supported, Bilateral upper extremity supported, During functional activity ?Standing balance-Leahy Scale: Poor ?Standing balance comment: reliant on RW external support ?  ?  ?  ?  ?  ?  ?  ?  ?  ?  ?  ?  ? ?  ?Cognition Arousal/Alertness: Awake/alert ?Behavior During Therapy: [facility] for tasks assessed/performed ?Overall Cognitive Status: Within Functional Limits for tasks assessed ?  ?  ?  ?  ?  ?  ?  ?  ?  ?  ?  ?  ?  ?  ?  ?  ?  ?  ?  ? ?  ?Exercises   ? ?  ?General Comments General comments (skin integrity, edema, etc.): VSS on RA, HR max during ambulation 121bpm ?  ?  ? ?Pertinent Vitals/Pain Pain Assessment ?Pain Assessment: Faces ?Faces Pain Scale: Hurts even more ?Pain Location: back, abdomen ?Pain Descriptors / Indicators: Sore, Guarding, Grimacing ?Pain Intervention(s): Limited activity within patient's tolerance, Monitored during session, Repositioned, Patient requesting pain meds-RN notified  ? ? ?Home Living   ?  ?  ?  ?  ?  ?  ?  ?  ?  ?   ?  ?Prior Function    ?  ?  ?   ? ?PT Goals (current goals can now be found in the care plan section) Acute Rehab PT Goals ?PT Goal Formulation: With patient ?Time For Goal Achievement: 09/14/21 ? ?  ?Frequency ? ? ? Min 5X/week ? ? ? ?  ?PT Plan Current plan remains appropriate  ? ? ?Co-evaluation   ?  ?  ?  ?  ? ?  ?AM-PAC PT "6 Clicks" Mobility   ?Outcome Measure ? Help needed turning from your back to your side while in a flat bed without using bedrails?: None ?Help needed moving from lying on your back to sitting on the side of a flat bed without using bedrails?: None ?Help needed moving to and from a bed to a chair (including a wheelchair)?: A Little ?Help needed standing up from a chair using your arms (e.g., wheelchair or bedside chair)?: A Little ?Help needed to walk in hospital room?: A Little ?Help needed climbing 3-5 steps with a railing? : A Lot ?6  Click Score: 19 ? ?  ?End of Session   ?Activity Tolerance: Patient limited by fatigue;Patient limited by pain ?Patient left: in bed;with call bell/phone within reach;with nursing/sitter in room ?Nurse Communication: Mobility status;Patient requests pain meds ?PT Visit Diagnosis: Other abnormalities of gait and mobility (R26.89);Muscle weakness (generalized) (M62.81);Pain ?Pain - Right/Left: Right ?Pain - part of body: Shoulder ?  ? ? ?Time: [id number] ?PT Time Calculation (min) (ACUTE ONLY): 32 min ? ?Charges:  $Therapeutic Exercise: 8-22 mins ?$Therapeutic Activity: 8-22 mins          ?          ? ?Audry Riles. PTA ?Acute Rehabilitation Services ?Office: [phone] ? ? ? ?Betsey Holiday Jansen Sciuto ?09/14/2021, 11:30 AM ? ?

## 2021-09-14 NOTE — TOC Progression Note (Signed)
Transition of Care (TOC) - Progression Note  ? ? ?Patient Details  ?Name: Kimberly Booker ?MRN: [id number] ?Date of Birth: 1985/08/15 ? ?Transition of Care (TOC) CM/SW Contact  ?Janae Bridgeman, RN ?Phone Number: ?09/14/2021, 12:23 PM ? ?Clinical Narrative:    ?CM and MSW with DTP Team will continue to follow the patient for discharge needs.  The patient will remain Inpatient in hospital through 10/29/2021 since the patient is not safe discharge to home with IV antibiotics due to polysubstance drug abuse history.  The patient plans to discharge to care of her father that lives in [place], [place] after IV antibiotics are completed.  The patient was recently seen by Dr. Lajoyce Corners S/P Right clavicle debridement, Hank Powerder and graft along with present Wound Vac. ? ? ? ?Expected Discharge Plan: Home/Self Care ?Barriers to Discharge: Continued Medical Work up ? ?Expected Discharge Plan and Services ?Expected Discharge Plan: Home/Self Care ?In-house Referral: Clinical Social Work, PCP / Management consultant ?Discharge Planning Services: CM Consult ?  ?Living arrangements for the past 2 months: Single Family Home ?                ?  ?  ?  ?  ?  ?  ?  ?  ?  ?  ? ? ?Social Determinants of Health (SDOH) Interventions ?  ? ?Readmission Risk Interventions ?   ? View : No data to display.  ?  ?  ?  ? ? ?

## 2021-09-14 NOTE — Progress Notes (Signed)
Mobility Specialist Progress Note  ? ? 09/14/21 1530  ?Mobility  ?Activity Contraindicated/medical hold  ? ?RN advised to hold off d/t wound vac problem. Will f/u as schedule permits.  ? ?Riverton Nation ?Mobility Specialist  ?  ?

## 2021-09-15 LAB — BASIC METABOLIC PANEL
Anion gap: 6 (ref 5–15)
BUN: 16 mg/dL (ref 6–20)
CO2: 27 mmol/L (ref 22–32)
Calcium: 8.6 mg/dL — ABNORMAL LOW (ref 8.9–10.3)
Chloride: 103 mmol/L (ref 98–111)
Creatinine, Ser: 0.47 mg/dL (ref 0.44–1.00)
GFR, Estimated: >60 mL/min (ref >60)
Glucose, Bld: 101 mg/dL — ABNORMAL HIGH (ref 70–99)
Potassium: 4 mmol/L (ref 3.5–5.1)
Sodium: 136 mmol/L (ref 135–145)

## 2021-09-15 LAB — CBC WITH DIFFERENTIAL/PLATELET
Abs Immature Granulocytes: 0.03 10*3/uL (ref 0.00–0.07)
Basophils Absolute: 0.1 10*3/uL (ref 0.0–0.1)
Basophils Relative: 1 %
Eosinophils Absolute: 0.3 10*3/uL (ref 0.0–0.5)
Eosinophils Relative: 4 %
HCT: 26.5 % — ABNORMAL LOW (ref 36.0–46.0)
Hemoglobin: 8.5 g/dL — ABNORMAL LOW (ref 12.0–15.0)
Immature Granulocytes: 0 %
Lymphocytes Relative: 25 %
Lymphs Abs: 1.8 10*3/uL (ref 0.7–4.0)
MCH: 29.8 pg (ref 26.0–34.0)
MCHC: 32.1 g/dL (ref 30.0–36.0)
MCV: 93 fL (ref 80.0–100.0)
Monocytes Absolute: 0.5 10*3/uL (ref 0.1–1.0)
Monocytes Relative: 7 %
Neutro Abs: 4.8 10*3/uL (ref 1.7–7.7)
Neutrophils Relative %: 63 %
Platelets: 366 10*3/uL (ref 150–400)
RBC: 2.85 MIL/uL — ABNORMAL LOW (ref 3.87–5.11)
RDW: 13.8 % (ref 11.5–15.5)
WBC: 7.5 10*3/uL (ref 4.0–10.5)
nRBC: 0 % (ref 0.0–0.2)

## 2021-09-15 NOTE — Progress Notes (Signed)
Physical Therapy Treatment ?Patient Details ?Name: Kimberly Booker ?MRN: [id number] ?DOB: 12/06/1985 ?Today's Date: 09/15/2021 ? ? ?History of Present Illness Pt is a 36 y.o. female initially admitted to [facility] 08/15/21 with R shoulder abscess; admit to [facility] 08/26/21 with MRSA bacteremia. 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 S/p L4-5 laminectomy and abscess debridement 3/25. Pt with pleural effusion s/p R pigtail chest tube 3/26. Abdominal CT showed bowel perforation; s/p ex lap with patch repair on 3/27. S/p R psoas abscess drainage on 3/30. S/p R clavicle I&D with wound vac placement 3/31. Pt developed severe LBP on 4/2; MRI showed improvement in abscess. PMH includes anxiety, depression, polysubstance abuse, R clavicle fx ~8 mo prior to admission. ? ?  ?PT Comments  ? ? Pt received supine and agreeable to session and excited to go outside. Pt able to stand pivot transfer without AD to [facility] with min assist to rise and steady during session x5 times. Pt requiring min guard for ambulation with rollator outside with good negotiation of uneven ground. Pt with good tolerance for LE therex. Pt continues to be limited by pain in her lower back. Pt continues to benefit from skilled PT services to progress toward functional mobility goals.  ?  ?Recommendations for follow up therapy are one component of a multi-disciplinary discharge planning process, led by the attending physician.  Recommendations may be updated based on patient status, additional functional criteria and insurance authorization. ? ?Follow Up Recommendations ? Home health PT ?  ?  ?Assistance Recommended at Discharge Intermittent Supervision/Assistance  ?Patient can return home with the following A little help with walking and/or transfers;Assistance with cooking/housework;Assist for transportation;Help with stairs or ramp for entrance;A little help with bathing/dressing/bathroom ?  ?Equipment Recommendations ? Rolling walker (2  wheels)  ?  ?Recommendations for Other Services   ? ? ?  ?Precautions / Restrictions Precautions ?Precautions: Fall;Back;Other (comment) ?Precaution Comments: R clavicle wound vac; abdominal precautions for comfort ?Restrictions ?Weight Bearing Restrictions: No ?RUE Weight Bearing: Weight bearing as tolerated  ?  ? ?Mobility ? Bed Mobility ?Overal bed mobility: Modified Independent ?Bed Mobility: Sidelying to Sit, Sit to Sidelying ?Rolling: Modified independent (Device/Increase time) ?Sidelying to sit: Modified independent (Device/Increase time) ?  ?  ?Sit to sidelying: Modified independent (Device/Increase time) ?  ?  ? ?Transfers ?Overall transfer level: Needs assistance ?Equipment used: Rolling walker (2 wheels) ?Transfers: Sit to/from Stand ?Sit to Stand: Min guard, Min assist ?  ?  ?  ?  ?  ?General transfer comment: benefitting from elevated surface, min a from low bench outside ?  ? ?Ambulation/Gait ?Ambulation/Gait assistance: Min guard ?Gait Distance (Feet): 175 Feet ?Assistive device: Rollator (4 wheels) ?Gait Pattern/deviations: Step-through pattern, Decreased stride length, Antalgic, Trunk flexed ?Gait velocity: Decreased ?  ?  ?General Gait Details: Slow, guarded gait, good negotiation of uneven ground outside around fountain ? ? ?Stairs ?  ?  ?  ?  ?  ? ? ?Wheelchair Mobility ?  ? ?Modified Rankin (Stroke Patients Only) ?  ? ? ?  ?Balance Overall balance assessment: Needs assistance ?Sitting-balance support: Feet supported ?Sitting balance-Leahy Scale: Good ?  ?  ?Standing balance support: Single extremity supported, Bilateral upper extremity supported, During functional activity ?Standing balance-Leahy Scale: Poor ?Standing balance comment: reliant on RW external support ?  ?  ?  ?  ?  ?  ?  ?  ?  ?  ?  ?  ? ?  ?Cognition Arousal/Alertness: Awake/alert ?Behavior During Therapy:  WFL for tasks assessed/performed ?Overall Cognitive Status: Within Functional Limits for tasks assessed ?  ?  ?  ?  ?  ?  ?   ?  ?  ?  ?  ?  ?  ?  ?  ?  ?  ?  ?  ? ?  ?Exercises General Exercises - Lower Extremity ?Long Arc Quad: AROM, Right, Left, 20 reps ?Hip Flexion/Marching: AROM, Right, Left, 20 reps ?Other Exercises ?Other Exercises: double leg march x10 ? ?  ?General Comments General comments (skin integrity, edema, etc.): VSS on RA, HR max during outside ambulation 109 ?  ?  ? ?Pertinent Vitals/Pain Pain Assessment ?Pain Assessment: Faces ?Faces Pain Scale: Hurts whole lot ?Pain Location: back ?Pain Descriptors / Indicators: Sore, Guarding, Grimacing ?Pain Intervention(s): Limited activity within patient's tolerance, Monitored during session  ? ? ?Home Living   ?  ?  ?  ?  ?  ?  ?  ?  ?  ?   ?  ?Prior Function    ?  ?  ?   ? ?PT Goals (current goals can now be found in the care plan section) Acute Rehab PT Goals ?PT Goal Formulation: With patient ?Time For Goal Achievement: 09/14/21 ? ?  ?Frequency ? ? ? Min 5X/week ? ? ? ?  ?PT Plan Current plan remains appropriate  ? ? ?Co-evaluation   ?  ?  ?  ?  ? ?  ?AM-PAC PT "6 Clicks" Mobility   ?Outcome Measure ? Help needed turning from your back to your side while in a flat bed without using bedrails?: None ?Help needed moving from lying on your back to sitting on the side of a flat bed without using bedrails?: None ?Help needed moving to and from a bed to a chair (including a wheelchair)?: A Little ?Help needed standing up from a chair using your arms (e.g., wheelchair or bedside chair)?: A Little ?Help needed to walk in hospital room?: A Little ?Help needed climbing 3-5 steps with a railing? : A Lot ?6 Click Score: 19 ? ?  ?End of Session Equipment Utilized During Treatment: Gait belt ?Activity Tolerance: No increased pain;Patient limited by fatigue ?Patient left: in bed;with call bell/phone within reach;with nursing/sitter in room ?Nurse Communication: Mobility status ?PT Visit Diagnosis: Other abnormalities of gait and mobility (R26.89);Muscle weakness (generalized)  (M62.81);Pain ?Pain - Right/Left: Right ?Pain - part of body: Shoulder ?  ? ? ?Time: 1610-9604 ?PT Time Calculation (min) (ACUTE ONLY): 51 min ? ?Charges:  $Gait Training: 8-22 mins ?$Therapeutic Exercise: 8-22 mins ?$Therapeutic Activity: 8-22 mins          ?          ? ?Lenora Boys. PTA ?Acute Rehabilitation Services ?Office: [phone] ? ? ?Marlana Salvage Jenavieve Freda ?09/15/2021, 3:30 PM ? ?

## 2021-09-15 NOTE — Progress Notes (Signed)
[facility] ?PROGRESS NOTE ? ?Kimberly Booker [id number] DOB: 03/17/1986 DOA: 08/26/2021 ?PCP: Pcp, No ? ?Status: ?Remains inpatient appropriate because:  ?Unsafe discharge plan.  Patient with history of polysubstance abuse and therefore deemed inappropriate to discharge with PICC line in place for prolonged IV antibiotics.  ID documents last doses are due on 10/29/2021 ? ?Barriers to discharge: ?Social: ?None-she [place] ? ?Clinical: ?Continues to require IV antibiotics to treat multiple infections including MRSA bacteremia ? ?Level of care: Progressive ? ? ?Code Status: Full ?Family Communication: Patient only ?DVT prophylaxis: Lovenox ?COVID vaccination status: No immunizations on file ? ?HPI: ?36 year old with past medical history significant for anxiety, depression, suicidal attempt drug overdose, polysubstance abuse, right clavicle fracture, was [facility]2021 due to right shoulder abscess.She was treated with I&D in the emergency department followed by IV antibiotics therapy as an inpatient.  She was diagnosed with MRSA bacteremia, endocarditis was ruled out with a TEE performed 3/16, but she continued to have positive blood cultures despite treatment with ceftriaxone and vancomycin.  Antibiotic therapy was changed to daptomycin.  She was [facility]. ?Extensive imagings have been done here which showed right clavicle osteomyelitis/phlegmon/abscess, widespread cervical, thoracic, lumbar discitis/osteomyelitis/epidural abscess/cord impingement.  MRI also showed bilateral iliopsoas abscess, right complex pleural effusion.  ID, orthopedics, neurosurgery following. ?Underwent lumbar laminectomy/debridement of abscess, L4-L5 on 3/25. Hospital course also remarkable for finding of right-sided empyema, gastric perforation. Status post emergent laparotomy and perforation of repair by general  surgery.  PCCM was also following for chest tube on the right side, which was removed on 3/29. ?Underwent aspiration of the psoas abscesses on 3/30.  ?Underwent debridement of the right clavicle and application of wound VAC to the right shoulder area on 3/31. ?Developed severe lower back pain on 4/2.  MRI was done which showed improvement in the abscess.  Symptoms have improved.. ? ?Subjective: ?States slept well and again resting pain is controlled.  Updated on PT plans to try to find a supportive device for low back during ambulation. ? ?Objective: ?Vitals:  ? 09/15/21 0000 09/15/21 0239  ?BP: 103/64 102/64  ?Pulse: 90 95  ?Resp: 19 19  ?Temp: 98.1 ?F (36.7 ?C) 98.5 ?F (36.9 ?C)  ?SpO2: 95% 96%  ? ?No intake or output data in the 24 hours ending 09/15/21 0758 ? ?Filed Weights  ? 08/28/21 1059 08/29/21 1417 09/02/21 1010  ?Weight: 86.4 kg 86.4 kg 77.1 kg  ? ? ?Exam: ?Constitutional:  ?Respiratory: clear to auscultation bilaterally, no wheezing, no crackles. Normal respiratory effort. No accessory muscle use.  Room air ?Cardiovascular: S1-S2, normotensive, regular pulse, no peripheral edema ?Musculoskeletal: Wound VAC to right anterior shoulder ?Abdomen: no tenderness, no masses palpated. Bowel sounds positive. LBM 4/10 ?Neurologic: CN 2-12 grossly intact. Sensation intact, DTR normal. Strength 5/5 x all 4 extremities.  ?Psychiatric: normal judgment and insight. Alert and oriented x 3. Normal mood.  ? ? ?Assessment/Plan: ?MRSA bacteremia ?Patient has disseminated MRSA bacteremia.  ?TEE at outside facility on 3/16, negative for endocarditis as per the report  ?TTE did not show any vegetation.  Per ID note dated 4/4 no clear indication for repeat TEE given suspected source control. ?Patient is on vancomycin.  WBC has been fluctuating.  She remains afebrile.  Last blood cultures from 3/24 negative.  ID continues to follow.  ?Per ID patient will need a total of 8 weeks of antibiotic  therapy starting from date of last  surgery on 3/31 with tentative end date 5/26-of note blood cultures negative x2 on 3/24.  Repeat blood cultures obtained on 4/9 and are pending but no growth to date ?Patient with extremely poor dentition and this could be the source of her bacteremia ?  ?Gastric perforation  ?Complained of abdominal pain on 3/26.  Chest x-ray done after chest tube placement showed incidental finding of pneumoperitoneum.  CT abdomen/pelvis confirmed pneumoperitoneum.  General surgery consulted and she underwent emergent exploratory laparatomy with finding of gastric perforation.  ?Currently tolerating soft diet ?  ?Abscess in epidural space of cervical spine ?Discitis/osteomyelitis at C2-3, C5-6, and C6-7. Ventral epidural abscess at C2-3, C5, and C6 with cord impingement especially at the lower 2 levels. Facet arthritis on the left at C7-T1 and right at C3-4. ?-Also showed discitis/osteomyelitis of T9- T10, T11-T12 ?-Neurosurger seen by neurosurgery previously.  No focal neurological deficits noted  ?  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 Bilateral facet arthritis at L5-S1. Dorsal epidural abscess at L4 and L5,highly compressive on the thecal sac.--S/P  lumbar laminectomy, debridement of abscess, L4-L5 on 3/25.   ?On 4/2 patient was complaining of severe pain in the lower back radiating down to her legs.  MRI lumbar spine was ordered. ?It redemonstrated the osteomyelitis at T11-12 and L5-S1.  Status post L4-L5 laminectomy/debridement with significant decrease in the amount of the abscess.  A small collection was noted posterior to the S1 causing moderate thecal sac narrowing.  Other findings of iliopsoas abscess and sacroiliac septic arthritis were noted as seen previously on MRI pelvis. ?These findings were discussed with neurosurgery.  Patient does not have any focal neurological deficits.  No intervention was recommended. ?Symptoms appear to  have improved.  Reason for her symptoms are not entirely clear.  Continue to monitor.  No focal neurological deficits noted.   ?Will add LD Lyrica for pain med adjunct ?Reports adequate pain control at rest and primarily pain is with mobilization.  Will discuss with therapy to see if there is a supportive device/brace that can be used to decrease low back pain while ambulating ?  ?Iliopsoas abscess /Sacroiliac septic arthritis/osteomyelitis ? Pelvic MRI showed bilateral infectious sacroiliitis and osteomyelitis of the sacrum and periarticular right iliac bone. Bilateral psoas abscesses, Small bilateral paraspinal muscle abscesses, intramuscular abscess within the left piriformis muscle, small intramuscular abscess within the left gluteus medius muscle. ?Seen by interventional radiology and underwent aspiration on 3/30.  MRSA noted on cultures.   ?  ?Pleural effusion ?Chest CT showed a large loculated right pleural effusion concerning for empyema, subcentimeter groundglass nodularity within the bilateral lungs concerning for septic emboli.  ?S/P chest tube placement.  Fluid noted to be exudative.  Cultures without any growth.  No need for DNase/tPA.   ?Chest x-ray showed resolution of the effusion.  Chest tube was discontinued on 3/29.  Respiratory status is stable. ?  ?Normocytic anemia ?Hemoglobin low but stable.  ?Received 2 units PRBC at outside facility.  ?Current hemoglobin 8.3 ?  ?Anxiety with depression ?Continue Prozac and Ativan prn ?  ?Acute osteomyelitis of clavicle, right (HCC) ?MRI of the shoulder showed osteomyelitis, phlegmon, possible abscess.  Orthopedics following.  Underwent debridement of right clavicle and application of wound VAC on 3/31.  Cultures pending. ?  ?Polysubstance abuse  ?Patient reports last drug use was 1 year ago. ?  ?Pain management/physical deconditioning ?Patient with MRSA infection at multiple sites.  Subsequently with gastric perforation.  Pain management has been difficult.   She was subsequently started on a fentanyl patch by orthopedics.  Also on Dilaudid intravenously as needed.  Percocet orally as needed.  Continue bowel regimen. ?She is currently reporting adequate pain

## 2021-09-15 NOTE — Progress Notes (Signed)
Mobility Specialist Progress Note  ? ? 09/15/21 1047  ?Mobility  ?Activity Ambulated with assistance in hallway  ?Level of Assistance Standby assist, set-up cues, supervision of patient - no hands on  ?Assistive Device Four wheel walker  ?Distance Ambulated (ft) 100 ft  ?Activity Response Tolerated well  ?$Mobility charge 1 Mobility  ? ?Pt received in bed and agreeable to mobility. C/o back pain they rated 9/10. Pt to bed after session with all needs met.   ? ?Hildred Alamin ?Mobility Specialist  ?  ?

## 2021-09-15 NOTE — Progress Notes (Signed)
Orthopedic Tech Progress Note ?Patient Details:  ?Kimberly Booker ?1985-07-27 ?[id number] ? ?Ortho Devices ?Type of Ortho Device: Lumbar corsett ?Ortho Device/Splint Location: BACK ?Ortho Device/Splint Interventions: Ordered ?  ?Post Interventions ?Patient Tolerated: Well ?Instructions Provided: Care of device ? ?Janit Pagan ?09/15/2021, 5:18 PM ? ?

## 2021-09-15 NOTE — Plan of Care (Signed)

## 2021-09-15 NOTE — Progress Notes (Signed)
Occupational Therapy Treatment ?Patient Details ?Name: Kimberly Booker ?MRN: [id number] ?DOB: 1985-11-04 ?Today's Date: 09/15/2021 ? ? ?History of present illness Pt is a 36 y.o. female initially admitted to [facility] 08/15/21 with R shoulder abscess; admit to [facility] 08/26/21 with MRSA bacteremia. 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 S/p L4-5 laminectomy and abscess debridement 3/25. Pt with pleural effusion s/p R pigtail chest tube 3/26. Abdominal CT showed bowel perforation; s/p ex lap with patch repair on 3/27. S/p R psoas abscess drainage on 3/30. S/p R clavicle I&D with wound vac placement 3/31. Pt developed severe LBP on 4/2; MRI showed improvement in abscess. PMH includes anxiety, depression, polysubstance abuse, R clavicle fx ~8 mo prior to admission. ?  ?OT comments ? Pt progressing towards goals overall, however limited by back pain this session. Pt min guard for stand pivot transfer to [facility], declining ambulation to toilet due to urgency. Pt mod I for bed mobility with log rolling technique, min guard for transfers due to pain, continues to benefit from elevated surface. Pt presenting with impairments listed below, will follow acutely. Continue to recommend HHOT at d/c.  ? ?Recommendations for follow up therapy are one component of a multi-disciplinary discharge planning process, led by the attending physician.  Recommendations may be updated based on patient status, additional functional criteria and insurance authorization. ?   ?Follow Up Recommendations ? Home health OT  ?  ?Assistance Recommended at Discharge Intermittent Supervision/Assistance  ?Patient can return home with the following ? Assist for transportation;Help with stairs or ramp for entrance;A little help with walking and/or transfers;A little help with bathing/dressing/bathroom ?  ?Equipment Recommendations ? Tub/shower seat;BSC/3in1  ?  ?Recommendations for Other Services   ? ?  ?Precautions / Restrictions  Precautions ?Precautions: Fall;Back;Other (comment) ?Precaution Comments: R clavicle wound vac; abdominal precautions for comfort ?Restrictions ?Weight Bearing Restrictions: No ?RUE Weight Bearing: Weight bearing as tolerated  ? ? ?  ? ?Mobility Bed Mobility ?Overal bed mobility: Modified Independent ?Bed Mobility: Sidelying to Sit, Sit to Sidelying ?Rolling: Modified independent (Device/Increase time) ?Sidelying to sit: Modified independent (Device/Increase time) ?  ?  ?Sit to sidelying: Modified independent (Device/Increase time) ?  ?  ? ?Transfers ?Overall transfer level: Needs assistance ?Equipment used: Rolling walker (2 wheels) ?Transfers: Sit to/from Stand ?Sit to Stand: Min guard ?Stand pivot transfers: Min guard ?  ?  ?  ?  ?General transfer comment: benefitting from elevated surface ?  ?  ?Balance Overall balance assessment: Needs assistance ?Sitting-balance support: Feet supported ?Sitting balance-Leahy Scale: Good ?  ?  ?Standing balance support: Single extremity supported, Bilateral upper extremity supported, During functional activity ?Standing balance-Leahy Scale: Poor ?Standing balance comment: reliant on RW external support ?  ?  ?  ?  ?  ?  ?  ?  ?  ?  ?  ?   ? ?ADL either performed or assessed with clinical judgement  ? ?ADL Overall ADL's : Needs assistance/impaired ?  ?  ?  ?  ?  ?  ?  ?  ?  ?  ?Lower Body Dressing: Maximal assistance;Sitting/lateral leans ?Lower Body Dressing Details (indicate cue type and reason): no use of sock aid this session due to pt's urgency to use bathroom ?Toilet Transfer: Rolling walker (2 wheels);BSC/3in1;Stand-pivot;Min guard ?  ?Toileting- Clothing Manipulation and Hygiene: Supervision/safety;Sitting/lateral lean ?Toileting - Clothing Manipulation Details (indicate cue type and reason): for pericare ?  ?  ?Functional mobility during ADLs: Min guard;Rolling walker (2 wheels) ?General ADL  Comments: pt declinign other ADLs at this time due to pain ?   ? ?Extremity/Trunk Assessment Upper Extremity Assessment ?Upper Extremity Assessment: Generalized weakness ?  ?Lower Extremity Assessment ?Lower Extremity Assessment: Defer to PT evaluation ?  ?  ?  ? ?Vision   ?Vision Assessment?: No apparent visual deficits ?  ?Perception Perception ?Perception: Not tested ?  ?Praxis Praxis ?Praxis: Not tested ?  ? ?Cognition Arousal/Alertness: Awake/alert ?Behavior During Therapy: [facility] for tasks assessed/performed ?Overall Cognitive Status: Within Functional Limits for tasks assessed ?  ?  ?  ?  ?  ?  ?  ?  ?  ?  ?  ?  ?  ?  ?  ?  ?  ?  ?  ?   ?Exercises   ? ?  ?Shoulder Instructions   ? ? ?  ?General Comments VSS on RA  ? ? ?Pertinent Vitals/ Pain       Pain Assessment ?Pain Assessment: Faces ?Pain Score: 7  ?Faces Pain Scale: Hurts whole lot ?Pain Location: back ?Pain Descriptors / Indicators: Sore, Guarding, Grimacing ?Pain Intervention(s): Limited activity within patient's tolerance, Monitored during session, Patient requesting pain meds-RN notified, Repositioned ? ?Home Living   ?  ?  ?  ?  ?  ?  ?  ?  ?  ?  ?  ?  ?  ?  ?  ?  ?  ?  ? ?  ?Prior Functioning/Environment    ?  ?  ?  ?   ? ?Frequency ? Min 2X/week  ? ? ? ? ?  ?Progress Toward Goals ? ?OT Goals(current goals can now be found in the care plan section) ? Progress towards OT goals: Progressing toward goals ? ?Acute Rehab OT Goals ?Patient Stated Goal: decrease pain ?OT Goal Formulation: With patient ?Time For Goal Achievement: 09/29/21 ?Potential to Achieve Goals: Good ?ADL Goals ?Pt Will Perform Upper Body Dressing: with modified independence;sitting;standing ?Pt Will Perform Lower Body Dressing: with modified independence;sit to/from stand;sitting/lateral leans ?Pt Will Transfer to Toilet: with modified independence;ambulating;regular height toilet ?Pt Will Perform Tub/Shower Transfer: with modified independence;Shower transfer;Tub transfer;rolling walker;ambulating;3 in 1  ?Plan Discharge plan needs to be  updated;Frequency needs to be updated   ? ?Co-evaluation ? ? ?   ?  ?  ?  ?  ? ?  ?AM-PAC OT "6 Clicks" Daily Activity     ?Outcome Measure ? ? Help from another person eating meals?: None ?Help from another person taking care of personal grooming?: None ?Help from another person toileting, which includes using toliet, bedpan, or urinal?: A Little ?Help from another person bathing (including washing, rinsing, drying)?: A Little ?Help from another person to put on and taking off regular upper body clothing?: A Lot ?Help from another person to put on and taking off regular lower body clothing?: A Lot ?6 Click Score: 18 ? ?  ?End of Session Equipment Utilized During Treatment: Gait belt;Rolling walker (2 wheels) ? ?OT Visit Diagnosis: Unsteadiness on feet (R26.81);Other abnormalities of gait and mobility (R26.89);Muscle weakness (generalized) (M62.81);Pain ?  ?Activity Tolerance Patient tolerated treatment well ?  ?Patient Left with call bell/phone within reach;in bed;with bed alarm set ?  ?Nurse Communication Mobility status ?  ? ?   ? ?Time: [id number] ?OT Time Calculation (min): 16 min ? ?Charges: OT General Charges ?$OT Visit: 1 Visit ?OT Treatments ?$Self Care/Home Management : 8-22 mins ? ?Alfonzo BeersNatalie Arianny Pun, OTD, OTR/L ?Acute Rehab ?[phone] ? ? ?Mayer MaskerNatalie M Ellenora Talton ?09/15/2021,  10:45 AM ?

## 2021-09-16 DIAGNOSIS — B9562 Methicillin resistant Staphylococcus aureus infection as the cause of diseases classified elsewhere: Secondary | ICD-10-CM | POA: Diagnosis not present

## 2021-09-16 DIAGNOSIS — R7881 Bacteremia: Secondary | ICD-10-CM | POA: Diagnosis not present

## 2021-09-16 NOTE — Progress Notes (Signed)
Physical Therapy Treatment ?Patient Details ?Name: Kimberly Booker ?MRN: [id number] ?DOB: 12/13/1985 ?Today's Date: 09/16/2021 ? ? ?History of Present Illness Pt is a 36 y.o. female initially admitted to [facility] 08/15/21 with R shoulder abscess; admit to [facility] 08/26/21 with MRSA bacteremia. 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 S/p L4-5 laminectomy and abscess debridement 3/25. Pt with pleural effusion s/p R pigtail chest tube 3/26. Abdominal CT showed bowel perforation; s/p ex lap with patch repair on 3/27. S/p R psoas abscess drainage on 3/30. S/p R clavicle I&D with wound vac placement 3/31. Pt developed severe LBP on 4/2; MRI showed improvement in abscess. PMH includes anxiety, depression, polysubstance abuse, R clavicle fx ~8 mo prior to admission. ? ?  ?PT Comments  ? ? Pt received supine with increased c/o pain this session, stating she was having a high back pain day despite premedication. Session focused on education and trial with lumbar corset. Pt able to don brace seated EOB with min assist and doff independently. Pt stating decrease in pain when ambulating with brace donned as well as a slight decrease in pain during unsupported sitting. Pt continues to benefit from skilled PT services to progress toward functional mobility goals.  ?  ?Recommendations for follow up therapy are one component of a multi-disciplinary discharge planning process, led by the attending physician.  Recommendations may be updated based on patient status, additional functional criteria and insurance authorization. ? ?Follow Up Recommendations ? Home health PT ?  ?  ?Assistance Recommended at Discharge Intermittent Supervision/Assistance  ?Patient can return home with the following A little help with walking and/or transfers;Assistance with cooking/housework;Assist for transportation;Help with stairs or ramp for entrance;A little help with bathing/dressing/bathroom ?  ?Equipment Recommendations ? Rolling  walker (2 wheels)  ?  ?Recommendations for Other Services   ? ? ?  ?Precautions / Restrictions Precautions ?Precautions: Fall;Back;Other (comment) ?Precaution Comments: R clavicle wound vac; abdominal precautions for comfort, lumbar corset for comfort ?Restrictions ?Weight Bearing Restrictions: No ?RUE Weight Bearing: Weight bearing as tolerated  ?  ? ?Mobility ? Bed Mobility ?Overal bed mobility: Modified Independent ?Bed Mobility: Sidelying to Sit, Sit to Sidelying ?Rolling: Modified independent (Device/Increase time) ?Sidelying to sit: Modified independent (Device/Increase time) ?  ?  ?Sit to sidelying: Modified independent (Device/Increase time) ?  ?  ? ?Transfers ?Overall transfer level: Needs assistance ?Equipment used: Rolling walker (2 wheels) ?Transfers: Sit to/from Stand ?Sit to Stand: Min guard, Mod assist ?  ?  ?  ?  ?  ?General transfer comment: benefitting from elevated surface, mod a from couch in room secondary to pain ?  ? ?Ambulation/Gait ?Ambulation/Gait assistance: Min guard ?Gait Distance (Feet): 20 Feet ?Assistive device: Rolling walker (2 wheels) ?Gait Pattern/deviations: Step-through pattern, Decreased stride length, Antalgic, Trunk flexed ?Gait velocity: Decreased ?  ?  ?General Gait Details: Slow, guarded gait secondary to high pain day ? ? ?Stairs ?  ?  ?  ?  ?  ? ? ?Wheelchair Mobility ?  ? ?Modified Rankin (Stroke Patients Only) ?  ? ? ?  ?Balance Overall balance assessment: Needs assistance ?Sitting-balance support: Feet supported ?Sitting balance-Leahy Scale: Good ?  ?  ?Standing balance support: Single extremity supported, Bilateral upper extremity supported, During functional activity ?Standing balance-Leahy Scale: Poor ?Standing balance comment: reliant on RW external support ?  ?  ?  ?  ?  ?  ?  ?  ?  ?  ?  ?  ? ?  ?Cognition Arousal/Alertness: Awake/alert ?Behavior  During Therapy: [facility] for tasks assessed/performed ?Overall Cognitive Status: Within Functional Limits for tasks  assessed ?  ?  ?  ?  ?  ?  ?  ?  ?  ?  ?  ?  ?  ?  ?  ?  ?  ?  ?  ? ?  ?Exercises   ? ?  ?General Comments   ?  ?  ? ?Pertinent Vitals/Pain Pain Assessment ?Pain Assessment: Faces ?Faces Pain Scale: Hurts worst ?Pain Location: back ?Pain Descriptors / Indicators: Sore, Guarding, Grimacing ?Pain Intervention(s): Limited activity within patient's tolerance, Monitored during session, Repositioned  ? ? ?Home Living   ?  ?  ?  ?  ?  ?  ?  ?  ?  ?   ?  ?Prior Function    ?  ?  ?   ? ?PT Goals (current goals can now be found in the care plan section) Acute Rehab PT Goals ?PT Goal Formulation: With patient ?Time For Goal Achievement: 09/14/21 ? ?  ?Frequency ? ? ? Min 5X/week ? ? ? ?  ?PT Plan Current plan remains appropriate  ? ? ?Co-evaluation   ?  ?  ?  ?  ? ?  ?AM-PAC PT "6 Clicks" Mobility   ?Outcome Measure ? Help needed turning from your back to your side while in a flat bed without using bedrails?: None ?Help needed moving from lying on your back to sitting on the side of a flat bed without using bedrails?: None ?Help needed moving to and from a bed to a chair (including a wheelchair)?: A Little ?Help needed standing up from a chair using your arms (e.g., wheelchair or bedside chair)?: A Little ?Help needed to walk in hospital room?: A Little ?Help needed climbing 3-5 steps with a railing? : A Lot ?6 Click Score: 19 ? ?  ?End of Session Equipment Utilized During Treatment: Gait belt;Other (comment) (lumbar corset) ?Activity Tolerance: No increased pain;Patient limited by fatigue ?Patient left: in bed;with call bell/phone within reach;with nursing/sitter in room ?Nurse Communication: Mobility status ?PT Visit Diagnosis: Other abnormalities of gait and mobility (R26.89);Muscle weakness (generalized) (M62.81);Pain ?Pain - Right/Left: Right ?Pain - part of body: Shoulder ?  ? ? ?Time: [id number] ?PT Time Calculation (min) (ACUTE ONLY): 19 min ? ?Charges:  $Therapeutic Activity: 8-22 mins          ?          ? ?Audry Riles.  PTA ?Acute Rehabilitation Services ?Office: [phone] ? ? ? ?Betsey Holiday Brittny Spangle ?09/16/2021, 3:09 PM ? ?

## 2021-09-16 NOTE — Plan of Care (Signed)

## 2021-09-16 NOTE — Progress Notes (Signed)
Mobility Specialist Progress Note  ? ? 09/16/21 1404  ?Mobility  ?Activity Ambulated with assistance in room  ?Level of Assistance Standby assist, set-up cues, supervision of patient - no hands on  ?Assistive Device Four wheel walker  ?Distance Ambulated (ft) 8 ft  ?Activity Response Tolerated fair  ?$Mobility charge 1 Mobility  ? ?Pt received in bed and agreeable to try brace on. C/o more pain than usual. Returned to bed with call bell in reach and RN notified.  ? ?Hildred Alamin ?Mobility Specialist  ?  ?

## 2021-09-16 NOTE — Progress Notes (Signed)
?   ? ? ? ? ?[facility] ? ?Date of Admission:  08/26/2021   Total days of inpatient antibiotics 21 ? ?Principal Problem: ?  MRSA bacteremia ?Active Problems: ?  Pathologic fracture of right clavicle ?  Anxiety with depression ?  Polysubstance abuse (HCC) ?  Transaminitis ?  Normocytic anemia ?  Abscess in epidural space of lumbar spine ?  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  Pleural effusion ?  Iliopsoas abscess (HCC) ?  Gastric perforation (HCC) ?  Acute osteomyelitis of clavicle, right (HCC) ?  Pain management ?     ?    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 ?Recommendations: ?-Continue vancomycin x 8 weeks EOT 10/29/21(from last surgery 3/31) ?-Not a home IV antibiotic candidate ?-Engage ID with new concerns or closer to discharge for ID appointment outpatient ? ?#Incidental pneumoperitoneum SP exlap and graham patch for gastric perforation ?-No new complaints ?Microbiology:   ?Antibiotics: ?Daptomycin 3/23-25 ?Ceftriaxone 3/23-24 ?Vancomycin 3/26-p ?[place]+ cipro 3/26-27 ?Cultures: ?Blood ?3/24 NG ? ?Per microbiology at [facility]) ?3/12 blood cx 4/4 bottles MRSA ?3/14 blood cx both sets MRSA ?3/22 blood cx no growth in 2 days ( prelim) ? Other: ? ?3/12 urine cx normal floral ( 60,000 colonies) ?  ?3/12 chest wound cx MRSA  ? ?  ? ?SUBJECTIVE: ?Resting  in bed. No new complaints.  ? ?Review of Systems: ?Review of  Systems  ?All other systems reviewed and are negative. ? ? ?Scheduled Meds: ? bacitracin   Topical BID  ? enoxaparin (LOVENOX) injection  40 mg Subcutaneous Q24H  ? feeding supplement  237 mL Oral BID BM  ? fentaNYL  1 patch Transdermal Q72H  ? FLUoxetine  40 mg Oral Daily  ? lidocaine  1 patch Transdermal Q24H  ? mouth rinse  15 mL Mouth Rinse BID  ? methocarbamol  1,000 mg Oral Q6H  ? multivitamin with minerals  1 tablet Oral Daily  ? nicotine  21 mg Transdermal Daily  ? pantoprazole  40 mg Oral BID  ? polyethylene glycol  17 g Oral Daily  ? pregabalin  25 mg Oral TID  ? senna-docusate  2 tablet Oral BID  ? sodium chloride flush  10 mL Other Q8H  ? ?Continuous Infusions: ? sodium chloride 10 mL/hr at 09/06/21 0000  ? vancomycin 1,500 mg (09/16/21 0853)  ? ?PRN Meds:.bisacodyl, HYDROmorphone (DILAUDID) injection, lip balm, LORazepam, metoCLOPramide **OR** metoCLOPramide (REGLAN) injection, ondansetron **OR** ondansetron (ZOFRAN) IV, oxyCODONE-acetaminophen, phenol ?Allergies  ?Allergen Reactions  ? Amoxicillin Swelling  ?  Throat closes  ? ? ?OBJECTIVE: ?Vitals:  ? 09/15/21 2034 09/16/21 0400 09/16/21 0828 09/16/21 1133  ?BP: 103/63 99/60 103/62 107/64  ?Pulse: 84 90 79   ?Resp: 18 18 18 19   ?Temp: 98.6 ?F (37 ?C) 98 ?F (36.7 ?C) 98.2 ?F (36.8 ?C) 98.3 ?F (36.8 ?C)  ?TempSrc: Oral Oral Oral Oral  ?SpO2: 97% 97% 98% 98%  ?Weight:      ?Height:      ? ?Body mass index  is 26.63 kg/m?. ? ?Physical Exam ?Constitutional:   ?   Appearance: Normal appearance.  ?HENT:  ?   Head: Normocephalic and atraumatic.  ?   Right Ear: Tympanic membrane normal.  ?   Left Ear: Tympanic membrane normal.  ?   Nose: Nose normal.  ?   Mouth/Throat:  ?   Mouth: Mucous membranes are moist.  ?Eyes:  ?   Extraocular Movements: Extraocular movements intact.  ?   Conjunctiva/sclera: Conjunctivae normal.  ?   Pupils: Pupils are equal, round, and reactive to light.  ?Cardiovascular:  ?   Rate and Rhythm: Normal rate and regular rhythm.  ?   Heart  sounds: No murmur heard. ?  No friction rub. No gallop.  ?Pulmonary:  ?   Effort: Pulmonary effort is normal.  ?   Breath sounds: Normal breath sounds.  ?Abdominal:  ?   General: Abdomen is flat.  ?   Palpations: Abdomen is soft.  ?Musculoskeletal:     ?   General: Normal range of motion.  ?Skin: ?   General: Skin is warm and dry.  ?Neurological:  ?   General: No focal deficit present.  ?   Mental Status: She is alert and oriented to person, place, and time.  ?Psychiatric:     ?   Mood and Affect: Mood normal.  ? ? ? ? ?Lab Results ?Lab Results  ?Component Value Date  ? WBC 7.5 09/15/2021  ? HGB 8.5 (L) 09/15/2021  ? HCT 26.5 (L) 09/15/2021  ? MCV 93.0 09/15/2021  ? PLT 366 09/15/2021  ?  ?Lab Results  ?Component Value Date  ? CREATININE 0.47 09/15/2021  ? BUN 16 09/15/2021  ? NA 136 09/15/2021  ? K 4.0 09/15/2021  ? CL 103 09/15/2021  ? CO2 27 09/15/2021  ?  ?Lab Results  ?Component Value Date  ? ALT 24 09/07/2021  ? AST 17 09/07/2021  ? ALKPHOS 73 09/07/2021  ? BILITOT 0.7 09/07/2021  ?  ? ? ? ? ?Danelle Earthly, MD ?[facility] ?[facility] ?09/16/2021, 1:57 PM  ?

## 2021-09-16 NOTE — Progress Notes (Signed)
Nutrition Follow-up ? ?DOCUMENTATION CODES:  ?Not applicable ? ?INTERVENTION:  ?-continue Ensure Enlive po BID, each supplement provides 350 kcal and 20 grams of protein. ?-continue MVI with minerals daily ? ?NUTRITION DIAGNOSIS:  ?Increased nutrient needs related to acute illness, post-op healing, wound healing as evidenced by estimated needs. ?ongoing ? ?GOAL:  ?Patient will meet greater than or equal to 90% of their needs ?progressing ? ?MONITOR:  ?Diet advancement, PO intake, Supplement acceptance, Labs, Weight trends, Skin ? ?REASON FOR ASSESSMENT:  ?Consult ?Assessment of nutrition requirement/status ? ?ASSESSMENT:  ?36 year old female with medical history of anxiety, depression, suicide attempt by drug OD, polysubstance abuse, R clavicle fx 8 months ago. She presented to the ED ongoing R shoulder pain. She had gone to another hospital 2 weeks ago and found to have R shoulder abscess which was treated with I&D and IV abx. She was dx with MRSA bacteremia and endocarditis was r/o on TEE on 3/16. She was transferred from [facility] to [facility] due to ongoing positive blood cultures despite treatment. Once at [facility], extensive imaging showed R clavicle osteomyelitis/phlegmon/abscess, widespread cervical,thoracic,lumbar discitis/osteomyelitis/epidural abscess/cord impingement. MRI showed bilateral iliopsoas abscess, R complex pleural effusion. ID, Orthopedics, and Neurosurgery following.  Currently on daptomycin. Plan for transthoracic echo. Neurosurgery planning for lumbar laminectomy for debridement of abscess, L4-L5. ? ?3/25 - s/p lumbar laminectomy/debridement of abscess, L4-L5 ?3/27 - s/p dx laparoscopy, exploratory laparotomy, graham patch repair of gastric ulcer perforation; NGT placed to suction ?3/30 - NGT removed ?3/31 - s/p debridement R clavicle and application of woundVAC ?4/02 - MRI spine redemonstrated osteomyelitis at T11-12 and L5-S1 ? ?Pt found to have abscesses in epidural space  of cervical and lumbar spine in addition to iliopsoas abscess. MRI on 4/02 showed improvement in the abscess.  ? ?Per NP and ID, pt to remain inpatient until completion of IV abx until 10/29/2021 -- pt with h/o of polysubstance abuse.  ? ?Pt denies having any complaints at this time and states pain is well controlled. Limited meal documentation since last RD assessment -- 100% x 2 recorded meals. Per RN, pt doing well with ONS. Recommend continue current nutrition plan of care at this time.  ? ?UOP: 2x unmeasured occurrences x24 hours ?woundVAC: 70ml x 24 hours ?I/O: +33.41ml since admit ? ?Medications: Ensure Enlive BID, mvi with minerals, protonix, miralax, senokot-S, IV abx ?Labs: ?Recent Labs  ?Lab 09/13/21 ?[id number] 09/15/21 ?0248  ?NA 135 136  ?K 4.3 4.0  ?CL 104 103  ?CO2 25 27  ?BUN 13 16  ?CREATININE 0.36* 0.47  ?CALCIUM 8.5* 8.6*  ?GLUCOSE 100* 101*  ?Hgb 8.5 ? ?Diet Order:   ?Diet Order   ? ?       ?  DIET SOFT Room service appropriate? Yes; Fluid consistency: Thin  Diet effective now       ?  ? ?  ?  ? ?  ? ?EDUCATION NEEDS:  ?Not appropriate for education at this time ? ?Skin:  Skin Assessment: Skin Integrity Issues: ?Skin Integrity Issues:: Incisions ?Incisions: throat, back, abdomen ? ?Last BM:  4/10 ? ?Height:  ?Ht Readings from Last 1 Encounters:  ?09/02/21 5\' 7"  (1.702 m)  ? ?Weight:  ?Wt Readings from Last 1 Encounters:  ?09/02/21 77.1 kg  ? ?Ideal Body Weight:  61.4 kg ? ?BMI:  Body mass index is 26.63 kg/m?. ? ?Estimated Nutritional Needs:  ?Kcal:  2400-2600 kcal ?Protein:  120-130 grams ?Fluid:  >/= 2.5 L/day ? ? ? ? ?Estill Bamberg  A., MS, RD, LDN (she/her/hers) ?RD pager number and weekend/on-call pager number located in [place]. ?

## 2021-09-16 NOTE — Progress Notes (Signed)
Pharmacy Antibiotic Note ? ?Kimberly Booker is a 36 y.o. female admitted on 08/26/2021 with  disseminated MRSA bacteremia (diffuse discitis/osteomyelitis/epidural abscess of cervical, thoracic and lumbar spine, bilateral septic arthritis, osteomyelitis of R clavicle, R lung empyema) .  ?  ?Patient was initially started on vancomycin 3/13-3/15 along with ceftriaxone 3/13-3/24. Vancomycin was transitioned to daptomycin and given 3/16-3/25. Pt was transferred to [facility] on 08/26/21. ID has consulted pharmacy dose vancomycin for coverage of MRSA R lung empyema.  ?  ?Patient is s/p placement of IR drains into iliopsoas abscesses 3/30 and debridement of right clavicle 3/31. Scr remains stable.  ?Tentative end date: 10/29/21 per ID recs (8 wks) ? ?Plan: ?Continue Vancomycin 1500 mg IV q12h with therapeutic AUC ~462. ?Goal AUC 400-550 ?Plan to check levels weekly on Mondays. ?Monitor renal function and clinical signs of infection  ? ?Height: 5\' 7"  (170.2 cm) ?Weight: 77.1 kg (170 lb) ?IBW/kg (Calculated) : 61.6 ? ?Temp (24hrs), Avg:98.4 ?F (36.9 ?C), Min:98 ?F (36.7 ?C), Max:98.6 ?F (37 ?C) ? ?Recent Labs  ?Lab 09/10/21 ?0212 09/11/21 ?0152 09/13/21 ?[id number] 09/13/21 ?[id number] 09/15/21 ?0248  ?WBC 10.3 9.5 11.3*  --  7.5  ?CREATININE  --   --  0.36*  --  0.47  ?[place]  --   --   --  9*  --   ?VANCOPEAK  --   --  31  --   --   ? ?  ?Estimated Creatinine Clearance: 104.1 mL/min (by C-G formula based on SCr of 0.47 mg/dL).   ? ?Allergies  ?Allergen Reactions  ? Amoxicillin Swelling  ?  Throat closes  ? ? ?Antimicrobials this admission: ?Vanc 3/13 >> 3/15 ?Ceftriaxone 3/13 >> 3/24 ?Daptomycin 3/16 >> 3/25 ?Vanc 3/26 >> ? ?Microbiology results: ?3/12 Bcx x2: 4/4 MRSA ?3/12 Chest wound cx: MRSA ?3/14 Bcx: 4/4 MRSA ?3/22 Bcx: ngtd ?3/24 Bcx: ngtd ?3/25 lumbar epidural wound/abscess: MRSA ?3/31 fungal cx: negative ?3/31 bone cx: negative ? ? ?Thank you for allowing pharmacy to be a part of this patient?s care. ? ?Luisa Hart, PharmD,  BCPS ?Clinical Pharmacist ?09/16/2021 10:16 AM  ? ?Please refer to [facility] for pharmacy phone number  ?

## 2021-09-16 NOTE — Progress Notes (Signed)
[facility] ?PROGRESS NOTE ? ?Kimberly Booker AGT:[id number] DOB: November 04, 1985 DOA: 08/26/2021 ?PCP: Pcp, No ? ?Status: ?Remains inpatient appropriate because:  ?Unsafe discharge plan.  Patient with history of polysubstance abuse and therefore deemed inappropriate to discharge with PICC line in place for prolonged IV antibiotics.  ID documents last doses are due on 10/29/2021 ? ?Barriers to discharge: ?Social: ?None-she plans to discharge home with father in the [facility] [place] area ? ?Clinical: ?Continues to require IV antibiotics to treat multiple infections including MRSA bacteremia ? ?Level of care: Progressive ? ? ?Code Status: Full ?Family Communication: Patient only ?DVT prophylaxis: Lovenox ?COVID vaccination status: No immunizations on file ? ?HPI: ?36 year old with past medical history significant for anxiety, depression, suicidal attempt drug overdose, polysubstance abuse, right clavicle fracture, was admitted at [facility] on 08/15/2021 due to right shoulder abscess.She was treated with I&D in the emergency department followed by IV antibiotics therapy as an inpatient.  She was diagnosed with MRSA bacteremia, endocarditis was ruled out with a TEE performed 3/16, but she continued to have positive blood cultures despite treatment with ceftriaxone and vancomycin.  Antibiotic therapy was changed to daptomycin.  She was then transferred to [facility]. ?Extensive imagings have been done here which showed right clavicle osteomyelitis/phlegmon/abscess, widespread cervical, thoracic, lumbar discitis/osteomyelitis/epidural abscess/cord impingement.  MRI also showed bilateral iliopsoas abscess, right complex pleural effusion.  ID, orthopedics, neurosurgery following. ?Underwent lumbar laminectomy/debridement of abscess, L4-L5 on 3/25. Hospital course also remarkable for finding of right-sided empyema, gastric perforation. Status post emergent laparotomy and perforation of repair by general  surgery.  PCCM was also following for chest tube on the right side, which was removed on 3/29. ?Underwent aspiration of the psoas abscesses on 3/30.  ?Underwent debridement of the right clavicle and application of wound VAC to the right shoulder area on 3/31. ?Developed severe lower back pain on 4/2.  MRI was done which showed improvement in the abscess.  Symptoms have improved.. ? ?Subjective: ?States slept well and again resting pain is controlled.  Updated on PT plans to try to find a supportive device for low back during ambulation. ? ?Objective: ?Vitals:  ? 09/16/21 0828 09/16/21 1133  ?BP: 103/62 107/64  ?Pulse: 79   ?Resp: 18 19  ?Temp: 98.2 ?F (36.8 ?C) 98.3 ?F (36.8 ?C)  ?SpO2: 98% 98%  ? ? ?Intake/Output Summary (Last 24 hours) at 09/16/2021 1331 ?Last data filed at 09/16/2021 0900 ?Gross per 24 hour  ?Intake --  ?Output 40 ml  ?Net -40 ml  ? ? ?Filed Weights  ? 08/28/21 1059 08/29/21 1417 09/02/21 1010  ?Weight: 86.4 kg 86.4 kg 77.1 kg  ? ? ?Exam: ?Constitutional:  ?Respiratory: clear to auscultation bilaterally, no wheezing, no crackles. Normal respiratory effort. No accessory muscle use.  Room air ?Cardiovascular: S1-S2, normotensive, regular pulse, no peripheral edema ?Musculoskeletal: Wound VAC to right anterior shoulder ?Abdomen: no tenderness, no masses palpated. Bowel sounds positive. LBM 4/10 ?Neurologic: CN 2-12 grossly intact. Sensation intact, DTR normal. Strength 5/5 x all 4 extremities.  ?Psychiatric: normal judgment and insight. Alert and oriented x 3. Normal mood.  ? ? ?Assessment/Plan: ?MRSA bacteremia ?Patient has disseminated MRSA bacteremia.  ?TEE at outside facility on 3/16, negative for endocarditis as per the report  ?TTE did not show any vegetation.  Per ID note dated 4/4 no clear indication for repeat TEE given suspected source control. ?Patient is on vancomycin.  WBC has been fluctuating.  She remains afebrile.  Last blood cultures from  3/24 negative.  ID continues to follow.  ?Per  ID patient will need a total of 8 weeks of antibiotic therapy starting from date of last surgery on 3/31 with tentative end date 5/26-of note blood cultures negative x2 on 3/24.  Repeat blood cultures obtained on 4/9 and are pending but no growth to date ?Patient with extremely poor dentition and this could be the source of her bacteremia ?  ?Gastric perforation  ?Complained of abdominal pain on 3/26.  Chest x-ray done after chest tube placement showed incidental finding of pneumoperitoneum.  CT abdomen/pelvis confirmed pneumoperitoneum.  General surgery consulted and she underwent emergent exploratory laparatomy with finding of gastric perforation.  ?Currently tolerating soft diet ?  ?Abscess in epidural space of cervical spine ?Discitis/osteomyelitis at C2-3, C5-6, and C6-7. Ventral epidural abscess at C2-3, C5, and C6 with cord impingement especially at the lower 2 levels. Facet arthritis on the left at C7-T1 and right at C3-4. ?-Also showed discitis/osteomyelitis of T9- T10, T11-T12 ?-Neurosurger seen by neurosurgery previously.  No focal neurological deficits noted  ?  ?Abscess in epidural space of lumbar spine/ongoing lumbar back pain/physical deconditioning ?L5-S1 discitis/osteomyelitis and probable bilateral sacroiliacseptic arthritis. Bilateral facet arthritis at L5-S1. Dorsal epidural abscess at L4 and L5,highly compressive on the thecal sac.--S/P  lumbar laminectomy, debridement of abscess, L4-L5 on 3/25.   ?On 4/2 patient was complaining of severe pain in the lower back radiating down to her legs.  MRI lumbar spine was ordered. ?It redemonstrated the osteomyelitis at T11-12 and L5-S1.  Status post L4-L5 laminectomy/debridement with significant decrease in the amount of the abscess.  A small collection was noted posterior to the S1 causing moderate thecal sac narrowing.  Other findings of iliopsoas abscess and sacroiliac septic arthritis were noted as seen previously on MRI pelvis. ?These findings were  discussed with neurosurgery.  Patient does not have any focal neurological deficits.  No intervention was recommended. ?Continue Duragesic patch, as needed Percocet, IV Dilaudid for most severe pain, scheduled Robaxin oral and Lyrica ?Reports adequate pain control at rest and primarily pain is with mobilization.  [employer]th[employer] has fitted patient for a lumbar corset to help decrease lumbar back pain with ambulation ?  ?Iliopsoas abscess /Sacroiliac septic arthritis/osteomyelitis ? Pelvic MRI showed bilateral infectious sacroiliitis and osteomyelitis of the sacrum and periarticular right iliac bone. Bilateral psoas abscesses, Small bilateral paraspinal muscle abscesses, intramuscular abscess within the left piriformis muscle, small intramuscular abscess within the left gluteus medius muscle. ?Seen by interventional radiology and underwent aspiration on 3/30.  MRSA noted on cultures.   ?  ?Pleural effusion ?Chest CT showed a large loculated right pleural effusion concerning for empyema, subcentimeter groundglass nodularity within the bilateral lungs concerning for septic emboli.  ?S/P chest tube placement.  Fluid noted to be exudative.  Cultures without any growth.  No need for DNase/tPA.   ?Chest x-ray showed resolution of the effusion.  Chest tube was discontinued on 3/29.  Respiratory status is stable. ?  ?Normocytic anemia ?Hemoglobin low but stable.  ?Received 2 units PRBC at outside facility.  ?Current hemoglobin 8.3 ?  ?Anxiety with depression ?Continue Prozac and Ativan prn ?  ?Acute osteomyelitis of clavicle, right (HCC) ?MRI of the shoulder showed osteomyelitis, phlegmon, possible abscess.  Orthopedics following.  Underwent debridement of right clavicle and application of wound VAC on 3/31.  Cultures no growth. ?  ?Polysubstance abuse  ?Patient reports last drug use was 1 year ago. ?  ? ? ? ?A physical therapy consult is indicated  based on the patient?s mobility assessment. ? ? ?Mobility Assessment (last 72 hours)    ? ? Mobility Assessment   ? ? Row Name 09/16/21 0900 09/15/21 1500 09/15/21 1000 09/15/21 0915 09/14/21 1953  ? Does patient have an order for bedrest or is patient medically unstable No - Continue a

## 2021-09-17 LAB — CULTURE, BLOOD (ROUTINE X 2)
Culture: NO GROWTH
Culture: NO GROWTH

## 2021-09-17 MED ORDER — HYDROMORPHONE HCL 1 MG/ML IJ SOLN
1.0000 mg | INTRAMUSCULAR | Status: DC | PRN
  Administered 2021-09-17 – 2021-09-18 (×5): 1 mg via INTRAVENOUS
  Filled 2021-09-17 (×5): qty 1

## 2021-09-17 NOTE — Progress Notes (Signed)
Physical Therapy Treatment ?Patient Details ?Name: Kimberly Booker ?MRN: [id number] ?DOB: March 03, 1986 ?Today's Date: 09/17/2021 ? ? ?History of Present Illness Pt is a 36 y.o. female initially admitted to [facility] 08/15/21 with R shoulder abscess; admit to [facility] 08/26/21 with MRSA bacteremia. 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 S/p L4-5 laminectomy and abscess debridement 3/25. Pt with pleural effusion s/p R pigtail chest tube 3/26. Abdominal CT showed bowel perforation; s/p ex lap with patch repair on 3/27. S/p R psoas abscess drainage on 3/30. S/p R clavicle I&D with wound vac placement 3/31. Pt developed severe LBP on 4/2; MRI showed improvement in abscess. PMH includes anxiety, depression, polysubstance abuse, R clavicle fx ~8 mo prior to admission. ? ?  ?PT Comments  ? ? Pt received supine after working with mobility team with increased c/o fatigue and pain, agreeable to bed level therex with good tolerance. Pt continues to have great motivation for mobility progression and improvement despite pain limitations. Pt continues to benefit from skilled PT services to progress toward functional mobility goals.   ?  ?Recommendations for follow up therapy are one component of a multi-disciplinary discharge planning process, led by the attending physician.  Recommendations may be updated based on patient status, additional functional criteria and insurance authorization. ? ?Follow Up Recommendations ? Home health PT ?  ?  ?Assistance Recommended at Discharge Intermittent Supervision/Assistance  ?Patient can return home with the following A little help with walking and/or transfers;Assistance with cooking/housework;Assist for transportation;Help with stairs or ramp for entrance;A little help with bathing/dressing/bathroom ?  ?Equipment Recommendations ? Rolling walker (2 wheels)  ?  ?Recommendations for Other Services   ? ? ?  ?Precautions / Restrictions Precautions ?Precautions: Fall;Back;Other  (comment) ?Precaution Comments: R clavicle wound vac; abdominal precautions for comfort, lumbar corset for comfort ?Restrictions ?Weight Bearing Restrictions: No ?RUE Weight Bearing: Weight bearing as tolerated  ?  ? ?Mobility ? Bed Mobility ?  ?  ?  ?  ?  ?  ?  ?  ?  ? ?Transfers ?  ?  ?  ?  ?  ?  ?  ?  ?  ?  ?  ? ?Ambulation/Gait ?  ?  ?  ?  ?  ?  ?  ?  ? ? ?Stairs ?  ?  ?  ?  ?  ? ? ?Wheelchair Mobility ?  ? ?Modified Rankin (Stroke Patients Only) ?  ? ? ?  ?Balance Overall balance assessment: Needs assistance ?Sitting-balance support: Feet supported ?Sitting balance-Leahy Scale: Good ?  ?  ?Standing balance support: Single extremity supported, Bilateral upper extremity supported, During functional activity ?Standing balance-Leahy Scale: Poor ?Standing balance comment: reliant on RW external support ?  ?  ?  ?  ?  ?  ?  ?  ?  ?  ?  ?  ? ?  ?Cognition Arousal/Alertness: Awake/alert ?Behavior During Therapy: [facility] for tasks assessed/performed ?Overall Cognitive Status: Within Functional Limits for tasks assessed ?  ?  ?  ?  ?  ?  ?  ?  ?  ?  ?  ?  ?  ?  ?  ?  ?  ?  ?  ? ?  ?Exercises General Exercises - Lower Extremity ?Ankle Circles/Pumps: Right, Left, 10 reps, Supine ?Quad Sets: Right, Left, 10 reps, Supine ?Gluteal Sets: 10 reps, Supine ?Heel Slides: Right, Left, 10 reps, Supine ?Hip ABduction/ADduction: Right, Left, 10 reps, Supine ?Straight Leg Raises: Right, Left, 10 reps, Supine ?  Hip Flexion/Marching: Right, Left, 10 reps, Supine ?Other Exercises ?Other Exercises: pillow squeeze x10 ? ?  ?General Comments   ?  ?  ? ?Pertinent Vitals/Pain Pain Assessment ?Pain Assessment: Faces ?Faces Pain Scale: Hurts worst ?Pain Location: back ?Pain Descriptors / Indicators: Sore, Guarding, Grimacing ?Pain Intervention(s): Limited activity within patient's tolerance, Monitored during session, Premedicated before session, Repositioned  ? ? ?Home Living   ?  ?  ?  ?  ?  ?  ?  ?  ?  ?   ?  ?Prior Function    ?  ?  ?   ? ?PT  Goals (current goals can now be found in the care plan section) Acute Rehab PT Goals ?PT Goal Formulation: With patient ?Time For Goal Achievement: 09/14/21 ? ?  ?Frequency ? ? ? Min 5X/week ? ? ? ?  ?PT Plan    ? ? ?Co-evaluation   ?  ?  ?  ?  ? ?  ?AM-PAC PT "6 Clicks" Mobility   ?Outcome Measure ? Help needed turning from your back to your side while in a flat bed without using bedrails?: None ?Help needed moving from lying on your back to sitting on the side of a flat bed without using bedrails?: None ?Help needed moving to and from a bed to a chair (including a wheelchair)?: A Little ?Help needed standing up from a chair using your arms (e.g., wheelchair or bedside chair)?: A Little ?Help needed to walk in hospital room?: A Little ?Help needed climbing 3-5 steps with a railing? : A Lot ?6 Click Score: 19 ? ?  ?End of Session   ?Activity Tolerance: Patient limited by pain;Patient limited by fatigue ?Patient left: in bed;with call bell/phone within reach ?Nurse Communication: Mobility status ?PT Visit Diagnosis: Other abnormalities of gait and mobility (R26.89);Muscle weakness (generalized) (M62.81);Pain ?Pain - Right/Left: Right ?Pain - part of body: Shoulder ?  ? ? ?Time: 5498-2641 ?PT Time Calculation (min) (ACUTE ONLY): 13 min ? ?Charges:  $Therapeutic Exercise: 8-22 mins          ?          ? ?Kimberly Booker. PTA ?Acute Rehabilitation Services ?Office: [phone] ? ? ? ?Kimberly Booker ?09/17/2021, 4:17 PM ? ?

## 2021-09-17 NOTE — Progress Notes (Signed)
Patient is being followed by DTP team for discharge needs. At this time, patient will remain hospitalized until 10/29/2021 and will discharge home with her father to his residence in [place], [place] once IV antibiotics are completed. ? ?Madilyn Fireman, MSW, LCSW ?Transitions of Care  Clinical Social Worker II ?[phone] ? ?

## 2021-09-17 NOTE — Progress Notes (Signed)
Pt is in the bathroom, not ready for PIV placement, RN aware and advised to re consult when pt is back in the bed, verbalized understanding. ?

## 2021-09-17 NOTE — Progress Notes (Signed)
[facility] ?PROGRESS NOTE ? ?Kimberly PotashDiana G Schecter ZOX:[id number] DOB: Oct 11, 1985 DOA: 08/26/2021 ?PCP: Pcp, No ? ?Status: ?Remains inpatient appropriate because:  ?Unsafe discharge plan.  Patient with history of polysubstance abuse and therefore deemed inappropriate to discharge with PICC line in place for prolonged IV antibiotics.  ID documents last doses are due on 10/29/2021 ? ?Barriers to discharge: ?Social: ?None-she [place] ? ?Clinical: ?Continues to require IV antibiotics to treat multiple infections including MRSA bacteremia ? ?Level of care: Progressive ? ? ?Code Status: Full ?Family Communication: Patient only ?DVT prophylaxis: Lovenox ?COVID vaccination status: No immunizations on file ? ?HPI: ?36 year old with past medical history significant for anxiety, depression, suicidal attempt drug overdose, polysubstance abuse, right clavicle fracture, [facility] due to right shoulder abscess.She was treated with I&D in the emergency department followed by IV antibiotics therapy as an inpatient.  She was diagnosed with MRSA bacteremia, endocarditis was ruled out with a TEE performed 3/16, but she continued to have positive blood cultures despite treatment with ceftriaxone and vancomycin.  Antibiotic therapy was changed to daptomycin.  She was then [facility]. ?Extensive imagings have been done here which showed right clavicle osteomyelitis/phlegmon/abscess, widespread cervical, thoracic, lumbar discitis/osteomyelitis/epidural abscess/cord impingement.  MRI also showed bilateral iliopsoas abscess, right complex pleural effusion.  ID, orthopedics, neurosurgery following. ?Underwent lumbar laminectomy/debridement of abscess, L4-L5 on 3/25. Hospital course also remarkable for finding of right-sided empyema, gastric perforation. Status post emergent laparotomy and perforation of repair by general  surgery.  PCCM was also following for chest tube on the right side, which was removed on 3/29. ?Underwent aspiration of the psoas abscesses on 3/30.  ?Underwent debridement of the right clavicle and application of wound VAC to the right shoulder area on 3/31. ?Developed severe lower back pain on 4/2.  MRI was done which showed improvement in the abscess.  Symptoms have improved.. ? ?Subjective: ?Sleeping soundly and did not awaken ? ?Objective: ?Vitals:  ? 09/17/21 0002 09/17/21 0413  ?BP: 104/68 112/60  ?Pulse: 84 82  ?Resp: 19 17  ?Temp: 98.6 ?F (37 ?C) 98.3 ?F (36.8 ?C)  ?SpO2:    ? ? ?Intake/Output Summary (Last 24 hours) at 09/17/2021 0755 ?Last data filed at 09/16/2021 0900 ?Gross per 24 hour  ?Intake --  ?Output 40 ml  ?Net -40 ml  ? ? ?Filed Weights  ? 08/28/21 1059 08/29/21 1417 09/02/21 1010  ?Weight: 86.4 kg 86.4 kg 77.1 kg  ? ? ?Exam: ?Constitutional: Sleeping soundly ?Respiratory: clear to auscultation bilaterally, no wheezing, no crackles. Normal respiratory effort. No accessory muscle use.  Room air ?Cardiovascular: S1-S2, normotensive, regular pulse, no peripheral edema ?Musculoskeletal: Wound VAC to right anterior shoulder ?Abdomen: no tenderness, no masses palpated. Bowel sounds positive. LBM 4/12 ?Neurologic: Sleeping but at baseline CN 2-12 grossly intact. Sensation intact, DTR normal. Strength 5/5 x all 4 extremities.  ?Psychiatric: Sleeping but at baseline normal judgment and insight. Alert and oriented x 3. Normal mood.  ? ? ?Assessment/Plan: ?MRSA bacteremia ?Patient has disseminated MRSA bacteremia.  ?TEE at outside facility on 3/16, negative for endocarditis as per the report  ?TTE did not show any vegetation.  Per ID note dated 4/4 no clear indication for repeat TEE given suspected source control. ?Per ID patient will need a total of 8 weeks of antibiotic therapy starting from date of last surgery on 3/31 with tentative end date 5/26-of note blood cultures  negative x2 on 3/24.  Repeat blood  cultures obtained on 4/9 and are pending but no growth to date ?Patient with extremely poor dentition and this could be the source of her bacteremia ?  ?Gastric perforation  ?Status postsurgical repair ?Currently tolerating soft diet ?  ?Abscess in epidural space of cervical spine ?Discitis/osteomyelitis at C2-3, C5-6, and C6-7. Ventral epidural abscess at C2-3, C5, and C6 with cord impingement especially at the lower 2 levels. Facet arthritis on the left at C7-T1 and right at C3-4. ?-Also showed discitis/osteomyelitis of T9- T10, T11-T12 ?-Seen by neurosurgery previously.  No focal neurological deficits noted  ?  ?Abscess in epidural space of lumbar spine/ongoing lumbar back pain/physical deconditioning ?Imaging review lumbar discitis as well as dorsal epidural abscess at L4-L5 which was compressive on the thecal sac S/P  lumbar laminectomy, debridement of abscess, L4-L5 on 3/25.   ?Continue Duragesic patch, as needed Percocet, IV Dilaudid for most severe pain, scheduled Robaxin oral and Lyrica ?Reports adequate pain control at rest and primarily pain is with mobilization.  [employer]th[employer] has fitted patient for a lumbar corset to help decrease lumbar back pain with ambulation ?  ?Iliopsoas abscess /Sacroiliac septic arthritis/osteomyelitis ? Pelvic MRI showed bilateral infectious sacroiliitis and osteomyelitis of the sacrum and periarticular right iliac bone. Bilateral psoas abscesses, Small bilateral paraspinal muscle abscesses, intramuscular abscess within the left piriformis muscle, small intramuscular abscess within the left gluteus medius muscle. ?Seen by interventional radiology and underwent aspiration on 3/30.  MRSA noted on cultures.   ?  ?Pleural effusion ?Chest CT showed a large loculated right pleural effusion concerning for empyema, subcentimeter groundglass nodularity within the bilateral lungs concerning for septic emboli.  ?S/P chest tube -  Fluid exudative.  Cultures negative ?Chest x-ray showed  resolution of the effusion.  Chest tube was discontinued on 3/29.  Respiratory status is stable. ?  ?Normocytic anemia ?Received 2 units PRBC at outside facility.  ?Hemoglobin stable at 8.5 ?  ?Anxiety with depression ?Continue Prozac and Ativan prn ?  ?Acute osteomyelitis of clavicle, right (HCC) ?MRI of the shoulder showed osteomyelitis, phlegmon, possible abscess.  Orthopedics following.  Underwent debridement of right clavicle and application of wound VAC on 3/31.  Cultures no growth. ?  ?Polysubstance abuse  ?Patient reports last drug use was 1 year ago. ?  ? ? ? ?A physical therapy consult is indicated based on the patient?s mobility assessment. ? ? ?Mobility Assessment (last 72 hours)   ? ? Mobility Assessment   ? ? Row Name 09/16/21 1500 09/16/21 0900 09/15/21 1500 09/15/21 1000 09/15/21 0915  ? Does patient have an order for bedrest or is patient medically unstable -- No - Continue assessment -- -- No - Continue assessment  ? What is the highest level of mobility based on the progressive mobility assessment? Level 5 (Walks with assist in room/hall) - Balance while stepping forward/back and can walk in room with assist - Complete Level 5 (Walks with assist in room/hall) - Balance while stepping forward/back and can walk in room with assist - Complete Level 5 (Walks with assist in room/hall) - Balance while stepping forward/back and can walk in room with assist - Complete Level 5 (Walks with assist in room/hall) - Balance while stepping forward/back and can walk in room with assist - Complete Level 5 (Walks with assist in room/hall) - Balance while stepping forward/back and can walk in room with assist - Complete  ? Is the above level different from baseline mobility prior to current  illness? -- No - Consider discontinuing PT/OT -- -- No - Consider discontinuing PT/OT  ? ? Row Name 09/14/21 1953 09/14/21 1100  ?  ?  ?  ? Does patient have an order for bedrest or is patient medically unstable No - Continue  assessment --     ? What is the highest level of mobility based on the progressive mobility assessment? Level 5 (Walks with assist in room/hall) - Balance while stepping forward/back and can walk in room with as

## 2021-09-17 NOTE — Progress Notes (Signed)
Mobility Specialist: Progress Note ? ? 09/17/21 1552  ?Mobility  ?Activity Ambulated with assistance in hallway  ?Level of Assistance Minimal assist, patient does 75% or more  ?Assistive Device Four wheel walker  ?RUE Weight Bearing WBAT  ?Distance Ambulated (ft) 230 ft  ?Activity Response Tolerated well  ?$Mobility charge 1 Mobility  ? ?Pt received in bed and agreeable to ambulation. C/o 8-9/10 back pain during session but did say that lumbar corset helped. No other c/o throughout. MinA to assist pt's legs back into the bed d/t pain. Pt back to bed with call bell and phone at her side.  ? ?Cristal Deer Tip Atienza ?Mobility Specialist ?Mobility Specialist 5 North: [phone] ?Mobility Specialist 6 North: [phone] ? ?

## 2021-09-18 ENCOUNTER — Inpatient Hospital Stay: Payer: Self-pay

## 2021-09-18 MED ORDER — ALPRAZOLAM 0.5 MG PO TABS
0.5000 mg | ORAL_TABLET | Freq: Three times a day (TID) | ORAL | Status: DC | PRN
  Administered 2021-09-18 – 2021-10-12 (×52): 0.5 mg via ORAL
  Filled 2021-09-18 (×54): qty 1

## 2021-09-18 NOTE — Progress Notes (Signed)
Mobility Specialist: Progress Note ? ? 09/18/21 1654  ?Mobility  ?Activity Ambulated with assistance in hallway  ?Level of Assistance Contact guard assist, steadying assist  ?Assistive Device Four wheel walker  ?RUE Weight Bearing WBAT  ?Distance Ambulated (ft) 230 ft  ?Activity Response Tolerated well  ?$Mobility charge 1 Mobility  ? ?Pt received in bed and agreeable to ambulation. To BSC, void successful, then hallway ambulation. C/o back pain during session, no rating given. Pt otherwise had no c/o. To recliner after session with call bell and phone at her side.  ? ?Cristal Deer Essie Gehret ?Mobility Specialist ?Mobility Specialist 5 North: [phone] ?Mobility Specialist 6 North: [phone] ? ?

## 2021-09-18 NOTE — Progress Notes (Signed)
Secure chat with Dr Judie Petit. Thedore Mins (ID), Dr Jan Fireman and Ardith Dark RN re PICC order.  ID states verbalizes no PICC is preference due to hx of IVDA, until PIV access is exhausted.  Rosey Bath RN states current PIV access working well at this time, requested PICC due to multiple PIV since admission and length of time IV ABT will continue.  New order from Dr Jerral Ralph to cancel PICC order at this time due to current IV access working well. ?

## 2021-09-18 NOTE — Progress Notes (Signed)
?PROGRESS NOTE ? ? ? ?Kimberly Booker  [id number] DOB: Feb 03, 1986 DOA: 08/26/2021 ?PCP: Pcp, No  ? ? ?Brief Narrative:  ?36 year old with history of anxiety, depression, suicide attempt with drug overdose, polysubstance abuse ?3/12 admitted to [facility] with right shoulder abscess, I&D, IV antibiotics, blood cultures with MRSA ?3/23 transferred to [facility] with persistent bacteremia despite being on ceftriaxone vancomycin. ? 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  Multiple procedures as below.  Remains in the hospital to continue vancomycin until 5/26 as per ID recommendation. ? ? ?Assessment & Plan: ?  ?MRSA bacteremia, negative TEE  ?Multilayer cervical, thoracic spine abscess, status post lumbar laminectomy and debridement on 3/25 ?Iliopsoas abscess/sacroiliac septic arthritis/osteomyelitis treated with IR drainage on 3/30 ?Complex right pleural effusion status post chest tube placement and removal 3/29.  Now on room air. ?Acute osteomyelitis of right clavicle, debridement and wound VAC placement 3/31, continues to be on wound VAC. ?Gastric perforation treated with emergent laparotomy.  Tolerating soft diet. ? ?Plan: ?last negative blood cultures 3/24-vancomycin until 5/26, no IV access.  PICC line today.  Cannot be discharged with PICC line because of drug use.  Need to stay in the hospital. ?Significant debility and pain, should not be using IV pain medications, fentanyl patch 25 mcg/h along with oral oxycodone.  Aim to taper off in 4 weeks.  Cannot use NSAIDs due to gastric perforation.  On PPI. ?Can use oral Xanax to help with anxiety.  Avoid IV benzodiazepines. ? ? ?DVT prophylaxis: SCDs Start: 09/03/21 1605 ?enoxaparin (LOVENOX) injection 40 mg Start: 08/29/21 1000 ?Place and maintain sequential compression device Start: 08/28/21 1049 ? ? ?Code Status: Full code ?Family  Communication: None ?Disposition Plan: Status is: Inpatient ?Remains inpatient appropriate because: Unsafe disposition.  Cannot be discharged with IV line. ?  ? ? ?Consultants:  ?Multiple as above ? ?Procedures:  ?Multiple as above ? ?Antimicrobials:  ?Currently on vancomycin ? ? ?Subjective: ?Patient seen and examined.  No overnight events.  Still using IV Dilaudid and patient agreed to stop using IV Dilaudid and use oral oxycodone instead.  IV Ativan discontinued, will use some oral Xanax.  Continues to have back pain and she thinks lumbar support has helped. ?Denies any nausea or vomiting. ?She has 1 his staples in the bellybutton that was left behind. ?Nursing having a hard time keeping up IV lines. ? ?Objective: ?Vitals:  ? 09/17/21 1548 09/17/21 2018 09/18/21 0458 09/18/21 0754  ?BP: 101/61 110/60 (!) 103/58 (!) 95/59  ?Pulse: 79 86 93 80  ?Resp: 18 19 17 17   ?Temp: 98.2 ?F (36.8 ?C) 98.8 ?F (37.1 ?C) 99.3 ?F (37.4 ?C) 98.4 ?F (36.9 ?C)  ?TempSrc: Oral Oral Oral Oral  ?SpO2: 98% 98% 97%   ?Weight:      ?Height:      ? ? ?Intake/Output Summary (Last 24 hours) at 09/18/2021 1134 ?Last data filed at 09/18/2021 1022 ?Gross per 24 hour  ?Intake 3452 ml  ?Output 400 ml  ?Net 3052 ml  ? ?Filed Weights  ? 08/28/21 1059 08/29/21 1417 09/02/21 1010  ?Weight: 86.4 kg 86.4 kg 77.1 kg  ? ? ?Examination: ? ?General exam: Appears calm and comfortable  ?Frail and debilitated but not in any distress.  On room air.  Comfortable to interaction. ?Respiratory system: Clear to auscultation. Respiratory effort normal. ?Right shoulder with wound VAC, serosanguineous drainage on the canister. ?Cardiovascular system: S1 & S2 heard, RRR.  ?  Gastrointestinal system: Soft.  Nontender.  Midline surgical incision healing well.  She has 1 staples left behind. ?Central nervous system: Alert and oriented. No focal neurological deficits. ?Extremities: Generalized weakness. ? ? ? ?Data Reviewed: I have personally reviewed following labs and imaging  studies ? ?CBC: ?Recent Labs  ?Lab 09/13/21 ?[id number] 09/15/21 ?0248  ?WBC 11.3* 7.5  ?NEUTROABS 8.0* 4.8  ?HGB 7.7* 8.5*  ?HCT 24.1* 26.5*  ?MCV 93.1 93.0  ?PLT 384 366  ? ?Basic Metabolic Panel: ?Recent Labs  ?Lab 09/13/21 ?[id number] 09/15/21 ?0248  ?NA 135 136  ?K 4.3 4.0  ?CL 104 103  ?CO2 25 27  ?GLUCOSE 100* 101*  ?BUN 13 16  ?CREATININE 0.36* 0.47  ?CALCIUM 8.5* 8.6*  ? ?GFR: ?Estimated Creatinine Clearance: 104.1 mL/min (by C-G formula based on SCr of 0.47 mg/dL). ?Liver Function Tests: ?No results for input(s): AST, ALT, ALKPHOS, BILITOT, PROT, ALBUMIN in the last 168 hours. ?No results for input(s): LIPASE, AMYLASE in the last 168 hours. ?No results for input(s): AMMONIA in the last 168 hours. ?Coagulation Profile: ?No results for input(s): INR, PROTIME in the last 168 hours. ?Cardiac Enzymes: ?No results for input(s): CKTOTAL, CKMB, CKMBINDEX, TROPONINI in the last 168 hours. ?BNP (last 3 results) ?No results for input(s): PROBNP in the last 8760 hours. ?HbA1C: ?No results for input(s): HGBA1C in the last 72 hours. ?CBG: ?No results for input(s): GLUCAP in the last 168 hours. ?Lipid Profile: ?No results for input(s): CHOL, HDL, LDLCALC, TRIG, CHOLHDL, LDLDIRECT in the last 72 hours. ?Thyroid Function Tests: ?No results for input(s): TSH, T4TOTAL, FREET4, T3FREE, THYROIDAB in the last 72 hours. ?Anemia Panel: ?No results for input(s): VITAMINB12, FOLATE, FERRITIN, TIBC, IRON, RETICCTPCT in the last 72 hours. ?Sepsis Labs: ?No results for input(s): PROCALCITON, LATICACIDVEN in the last 168 hours. ? ?Recent Results (from the past 240 hour(s))  ?Culture, blood (Routine X 2) w Reflex to ID Panel     Status: None  ? Collection Time: 09/12/21  4:28 PM  ? Specimen: BLOOD LEFT HAND  ?Result Value Ref Range Status  ? Specimen Description BLOOD LEFT HAND  Final  ? Special Requests   Final  ?  BOTTLES DRAWN AEROBIC AND ANAEROBIC Blood Culture results may not be optimal due to an inadequate volume of blood received in culture  bottles  ? Culture   Final  ?  NO GROWTH 5 DAYS ?Performed at [facility], [place] [address], [place], [place] [address] ?  ? Report Status 09/17/2021 FINAL  Final  ?Culture, blood (Routine X 2) w Reflex to ID Panel     Status: None  ? Collection Time: 09/12/21  4:28 PM  ? Specimen: BLOOD RIGHT HAND  ?Result Value Ref Range Status  ? Specimen Description BLOOD RIGHT HAND  Final  ? Special Requests   Final  ?  BOTTLES DRAWN AEROBIC ONLY Blood Culture results may not be optimal due to an inadequate volume of blood received in culture bottles  ? Culture   Final  ?  NO GROWTH 5 DAYS ?Performed at [facility], [place] [address], [place], [place] [address] ?  ? Report Status 09/17/2021 FINAL  Final  ?  ? ? ? ? ? ?Radiology Studies: ?[place] EKG SITE RITE ? ?Result Date: 09/18/2021 ?If [employer] not attached, placement could not be confirmed due to current cardiac rhythm.  ? ? ? ? ? ?Scheduled Meds: ? bacitracin   Topical BID  ? enoxaparin (LOVENOX) injection  40  mg Subcutaneous Q24H  ? feeding supplement  237 mL Oral BID BM  ? fentaNYL  1 patch Transdermal Q72H  ? FLUoxetine  40 mg Oral Daily  ? lidocaine  1 patch Transdermal Q24H  ? mouth rinse  15 mL Mouth Rinse BID  ? methocarbamol  1,000 mg Oral Q6H  ? multivitamin with minerals  1 tablet Oral Daily  ? nicotine  21 mg Transdermal Daily  ? pantoprazole  40 mg Oral BID  ? polyethylene glycol  17 g Oral Daily  ? pregabalin  25 mg Oral TID  ? senna-docusate  2 tablet Oral BID  ? sodium chloride flush  10 mL Other Q8H  ? ?Continuous Infusions: ? sodium chloride 10 mL/hr at 09/06/21 0000  ? vancomycin 1,500 mg (09/18/21 1022)  ? ? ? LOS: 23 days  ? ? ?Time spent: 35 minutes ? ? ? ?Barb Merino, MD ?[facility] ?Pager [phone] ? ?

## 2021-09-19 NOTE — Progress Notes (Signed)
?PROGRESS NOTE ? ? ? ?Kimberly PotashDiana G Booker  ZOX:[id number] DOB: 1986/03/28 DOA: 08/26/2021 ?PCP: Pcp, No  ? ? ?Brief Narrative:  ?36 year old with history of anxiety, depression, suicide attempt with drug overdose, polysubstance abuse ?[facility] with right [facility] abscess, I&D, IV antibiotics, blood cultures with MRSA ?3/23 [facility] with persistent bacteremia despite being on ceftriaxone vancomycin. ? 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  Multiple procedures as below.  Remains in the hospital to continue vancomycin until 5/26 as per ID recommendation. ? ? ?Assessment & Plan: ?  ?MRSA bacteremia, negative TEE  ?Multilayer cervical, thoracic spine abscess, status post lumbar laminectomy and debridement on 3/25 ?Iliopsoas abscess/sacroiliac septic arthritis/osteomyelitis treated with IR drainage on 3/30 ?Complex right pleural effusion status post chest tube placement and removal 3/29.  Now on room air. ?Acute osteomyelitis of right clavicle, debridement and wound VAC placement 3/31, continues to be on wound VAC. ?Gastric perforation treated with emergent laparotomy.  Tolerating soft diet. ? ?Plan: ?last negative blood cultures 3/24-vancomycin until 5/26, Need to stay in the hospital. ?Significant debility and pain, should not be using IV pain medications, fentanyl patch 25 mcg/h along with oral oxycodone.  Aim to taper off in 4 weeks.  Cannot use NSAIDs due to gastric perforation.  On PPI. ?Can use oral Xanax to help with anxiety.  Avoid IV benzodiazepines. ? ? ?DVT prophylaxis: SCDs Start: 09/03/21 1605 ?enoxaparin (LOVENOX) injection 40 mg Start: 08/29/21 1000 ?Place and maintain sequential compression device Start: 08/28/21 1049 ? ? ?Code Status: Full code ?Family Communication: None ?Disposition Plan: Status is: Inpatient ?Remains inpatient appropriate  because: Unsafe disposition.  Cannot be discharged with IV line. ?  ? ? ?Consultants:  ?Multiple as above ? ?Procedures:  ?Multiple as above ? ?Antimicrobials:  ?Currently on vancomycin ? ? ?Subjective: ? ?Seen and examined.  Symptoms okay with oral Xanax.  No other overnight events.  Back pain persist. ? ?Objective: ?Vitals:  ? 09/18/21 0754 09/18/21 1247 09/18/21 2022 09/19/21 0534  ?BP: (!) 95/59 97/65 (!) 98/59 (!) 95/57  ?Pulse: 80 80 84 71  ?Resp: 17 19 20 18   ?Temp: 98.4 ?F (36.9 ?C) 98.6 ?F (37 ?C) 99 ?F (37.2 ?C) 98 ?F (36.7 ?C)  ?TempSrc: Oral Oral Oral Oral  ?SpO2:   97% 98%  ?Weight:      ?Height:      ? ? ?Intake/Output Summary (Last 24 hours) at 09/19/2021 1323 ?Last data filed at 09/19/2021 1001 ?Gross per 24 hour  ?Intake 480.02 ml  ?Output 2075 ml  ?Net -1594.98 ml  ? ?Filed Weights  ? 08/28/21 1059 08/29/21 1417 09/02/21 1010  ?Weight: 86.4 kg 86.4 kg 77.1 kg  ? ? ?Examination: ? ?General: Looks fairly comfortable.  Sleeping in bed. ?Musculoskeletal: Right shoulder with wound VAC.  Minimally tender. ? ? ? ? ? ?Data Reviewed: I have personally reviewed following labs and imaging studies ? ?CBC: ?Recent Labs  ?Lab 09/13/21 ?[id number] 09/15/21 ?0248  ?WBC 11.3* 7.5  ?NEUTROABS 8.0* 4.8  ?HGB 7.7* 8.5*  ?HCT 24.1* 26.5*  ?MCV 93.1 93.0  ?PLT 384 366  ? ?Basic Metabolic Panel: ?Recent Labs  ?Lab 09/13/21 ?[id number] 09/15/21 ?0248  ?NA 135 136  ?K 4.3 4.0  ?CL 104 103  ?CO2 25 27  ?GLUCOSE 100* 101*  ?BUN 13 16  ?CREATININE 0.36* 0.47  ?CALCIUM 8.5* 8.6*  ? ?GFR: ?Estimated Creatinine Clearance: 104.1 mL/min (by  C-G formula based on SCr of 0.47 mg/dL). ?Liver Function Tests: ?No results for input(s): AST, ALT, ALKPHOS, BILITOT, PROT, ALBUMIN in the last 168 hours. ?No results for input(s): LIPASE, AMYLASE in the last 168 hours. ?No results for input(s): AMMONIA in the last 168 hours. ?Coagulation Profile: ?No results for input(s): INR, PROTIME in the last 168 hours. ?Cardiac Enzymes: ?No results for input(s):  CKTOTAL, CKMB, CKMBINDEX, TROPONINI in the last 168 hours. ?BNP (last 3 results) ?No results for input(s): PROBNP in the last 8760 hours. ?HbA1C: ?No results for input(s): HGBA1C in the last 72 hours. ?CBG: ?No results for input(s): GLUCAP in the last 168 hours. ?Lipid Profile: ?No results for input(s): CHOL, HDL, LDLCALC, TRIG, CHOLHDL, LDLDIRECT in the last 72 hours. ?Thyroid Function Tests: ?No results for input(s): TSH, T4TOTAL, FREET4, T3FREE, THYROIDAB in the last 72 hours. ?Anemia Panel: ?No results for input(s): VITAMINB12, FOLATE, FERRITIN, TIBC, IRON, RETICCTPCT in the last 72 hours. ?Sepsis Labs: ?No results for input(s): PROCALCITON, LATICACIDVEN in the last 168 hours. ? ?Recent Results (from the past 240 hour(s))  ?Culture, blood (Routine X 2) w Reflex to ID Panel     Status: None  ? Collection Time: 09/12/21  4:28 PM  ? Specimen: BLOOD LEFT HAND  ?Result Value Ref Range Status  ? Specimen Description BLOOD LEFT HAND  Final  ? Special Requests   Final  ?  BOTTLES DRAWN AEROBIC AND ANAEROBIC Blood Culture results may not be optimal due to an inadequate volume of blood received in culture bottles  ? Culture   Final  ?  NO GROWTH 5 DAYS ?Performed at [facility], [address], [place], [place] [address] ?  ? Report Status 09/17/2021 FINAL  Final  ?Culture, blood (Routine X 2) w Reflex to ID Panel     Status: None  ? Collection Time: 09/12/21  4:28 PM  ? Specimen: BLOOD RIGHT HAND  ?Result Value Ref Range Status  ? Specimen Description BLOOD RIGHT HAND  Final  ? Special Requests   Final  ?  BOTTLES DRAWN AEROBIC ONLY Blood Culture results may not be optimal due to an inadequate volume of blood received in culture bottles  ? Culture   Final  ?  NO GROWTH 5 DAYS ?Performed at [facility], [address], [place], [place] [address] ?  ? Report Status 09/17/2021 FINAL  Final  ?  ? ? ? ? ? ?Radiology Studies: ?[place] EKG SITE RITE ? ?Result Date: 09/18/2021 ?If [employer] not attached, placement  could not be confirmed due to current cardiac rhythm.  ? ? ? ? ? ?Scheduled Meds: ? bacitracin   Topical BID  ? enoxaparin (LOVENOX) injection  40 mg Subcutaneous Q24H  ? feeding supplement  237 mL Oral BID BM  ? fentaNYL  1 patch Transdermal Q72H  ? FLUoxetine  40 mg Oral Daily  ? lidocaine  1 patch Transdermal Q24H  ? mouth rinse  15 mL Mouth Rinse BID  ? methocarbamol  1,000 mg Oral Q6H  ? multivitamin with minerals  1 tablet Oral Daily  ? nicotine  21 mg Transdermal Daily  ? pantoprazole  40 mg Oral BID  ? polyethylene glycol  17 g Oral Daily  ? pregabalin  25 mg Oral TID  ? senna-docusate  2 tablet Oral BID  ? sodium chloride flush  10 mL Other Q8H  ? ?Continuous Infusions: ? sodium chloride 10 mL/hr at 09/06/21 0000  ? vancomycin 150 mL/hr  at 09/19/21 1001  ? ? ? LOS: 24 days  ? ? ?Time spent: 25 minutes ? ? ? ?Dorcas Carrow, MD ?[facility] ?Pager [phone] ? ?

## 2021-09-19 NOTE — Plan of Care (Signed)

## 2021-09-19 NOTE — Progress Notes (Signed)
Mobility Specialist: Progress Note ? ? 09/19/21 1446  ?Mobility  ?Activity Refused mobility  ? ?Pt refused mobility stating she had a rough night last night and today regarding her pain level. States she is in too much pain to attempt ambulation today. Will f/u as able.  ? ?Kimberly Booker ?Mobility Specialist ?Mobility Specialist [place]: [phone] ?Mobility Specialist [place]: [phone] ? ?

## 2021-09-20 LAB — BASIC METABOLIC PANEL
Anion gap: 6 (ref 5–15)
BUN: 11 mg/dL (ref 6–20)
CO2: 25 mmol/L (ref 22–32)
Calcium: 8.8 mg/dL — ABNORMAL LOW (ref 8.9–10.3)
Chloride: 106 mmol/L (ref 98–111)
Creatinine, Ser: 0.44 mg/dL (ref 0.44–1.00)
GFR, Estimated: >60 mL/min (ref >60)
Glucose, Bld: 94 mg/dL (ref 70–99)
Potassium: 3.9 mmol/L (ref 3.5–5.1)
Sodium: 137 mmol/L (ref 135–145)

## 2021-09-20 LAB — VANCOMYCIN, PEAK: Vancomycin Pk: 28 ug/mL — ABNORMAL LOW (ref 30–40)

## 2021-09-20 LAB — VANCOMYCIN, TROUGH: Vancomycin Tr: 10 ug/mL — ABNORMAL LOW (ref 15–20)

## 2021-09-20 MED ORDER — GABAPENTIN 100 MG PO CAPS
200.0000 mg | ORAL_CAPSULE | Freq: Three times a day (TID) | ORAL | Status: DC
  Administered 2021-09-20 – 2021-10-29 (×155): 200 mg via ORAL
  Filled 2021-09-20 (×155): qty 2

## 2021-09-20 NOTE — Progress Notes (Signed)
Occupational Therapy Treatment ?Patient Details ?Name: Kimberly PotashDiana G Booker ?MRN: [id number] ?DOB: 1986-02-22 ?Today's Date: 09/20/2021 ? ? ?History of present illness Pt is a 36 y.o. female [facility] with R shoulder abscess; [facility] 08/26/21 with MRSA bacteremia. 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 S/p L4-5 laminectomy and abscess debridement 3/25. Pt with pleural effusion s/p R pigtail chest tube 3/26. Abdominal CT showed bowel perforation; s/p ex lap with patch repair on 3/27. S/p R psoas abscess drainage on 3/30. S/p R clavicle I&D with wound vac placement 3/31. Pt developed severe LBP on 4/2; MRI showed improvement in abscess. PMH includes anxiety, depression, polysubstance abuse, R clavicle fx ~8 mo prior to admission. ?  ?OT comments ? Pt progressing towards goals this session, requiring supervision - min guard for seated and standing ADLs. Pt mod I with bed mobility, min A for transfers using RW and rollator. Pt able to walk short hallway distance, however limited by increased back and R shoulder pain. Pt presenting with impairments listed below, will follow acutely. Anticipate no OT follow up at d/c pending pt progress.  ? ?Recommendations for follow up therapy are one component of a multi-disciplinary discharge planning process, led by the attending physician.  Recommendations may be updated based on patient status, additional functional criteria and insurance authorization. ?   ?Follow Up Recommendations ? No OT follow up  ?  ?Assistance Recommended at Discharge Intermittent Supervision/Assistance  ?Patient can return home with the following ? Assist for transportation;Help with stairs or ramp for entrance;A little help with walking and/or transfers;A little help with bathing/dressing/bathroom ?  ?Equipment Recommendations ? Tub/shower seat;BSC/3in1  ?  ?Recommendations for Other Services   ? ?  ?Precautions / Restrictions Precautions ?Precautions:  Fall;Back;Other (comment) ?Precaution Comments: R clavicle wound vac; abdominal precautions for comfort, lumbar corset for comfort ?Required Braces or Orthoses: Spinal Brace ?Spinal Brace: Lumbar corset ?Restrictions ?Weight Bearing Restrictions: Yes ?RUE Weight Bearing: Weight bearing as tolerated  ? ? ?  ? ?Mobility Bed Mobility ?Overal bed mobility: Modified Independent ?  ?  ?  ?  ?  ?  ?General bed mobility comments: uses log rolling technique ?  ? ?Transfers ?Overall transfer level: Needs assistance ?Equipment used: Rolling walker (2 wheels) ?Transfers: Sit to/from Stand ?Sit to Stand: Min assist ?  ?  ?  ?  ?  ?General transfer comment: min A with elevated surface ?  ?  ?Balance Overall balance assessment: Needs assistance ?Sitting-balance support: Feet supported ?Sitting balance-Leahy Scale: Good ?  ?  ?Standing balance support: Single extremity supported, Bilateral upper extremity supported, During functional activity ?Standing balance-Leahy Scale: Poor ?Standing balance comment: pt stating she still is relying on UE strength a good amound on RW/Rollator during mobility ?  ?  ?  ?  ?  ?  ?  ?  ?  ?  ?  ?   ? ?ADL either performed or assessed with clinical judgement  ? ?ADL Overall ADL's : Needs assistance/impaired ?  ?  ?Grooming: Oral care;Wash/dry face;Standing;Supervision/safety ?Grooming Details (indicate cue type and reason): completed standing at sink ?  ?  ?  ?  ?Upper Body Dressing : Min guard;Sitting ?Upper Body Dressing Details (indicate cue type and reason): dons brace and gown ?  ?  ?Toilet Transfer: Supervision/safety;Rolling walker (2 wheels);Regular Toilet;Ambulation ?  ?Toileting- Clothing Manipulation and Hygiene: Supervision/safety;Sitting/lateral lean ?Toileting - Clothing Manipulation Details (indicate cue type and reason): for pericare ?  ?  ?  Functional mobility during ADLs: Rolling walker (2 wheels);Supervision/safety ?  ?  ? ?Extremity/Trunk Assessment Upper Extremity  Assessment ?Upper Extremity Assessment: Generalized weakness ?  ?Lower Extremity Assessment ?Lower Extremity Assessment: Defer to PT evaluation ?  ?  ?  ? ?Vision   ?Vision Assessment?: No apparent visual deficits ?  ?Perception Perception ?Perception: Not tested ?  ?Praxis Praxis ?Praxis: Not tested ?  ? ?Cognition Arousal/Alertness: Awake/alert ?Behavior During Therapy: [facility] for tasks assessed/performed ?Overall Cognitive Status: Within Functional Limits for tasks assessed ?  ?  ?  ?  ?  ?  ?  ?  ?  ?  ?  ?  ?  ?  ?  ?  ?  ?  ?  ?   ?Exercises   ? ?  ?Shoulder Instructions   ? ? ?  ?General Comments VSS on RA  ? ? ?Pertinent Vitals/ Pain       Pain Assessment ?Pain Assessment: Faces ?Pain Score: 7  ?Pain Location: back and R shoulder ?Pain Descriptors / Indicators: Guarding, Discomfort, Constant ?Pain Intervention(s): Limited activity within patient's tolerance, Monitored during session, Repositioned, Patient requesting pain meds-RN notified ? ?Home Living   ?  ?  ?  ?  ?  ?  ?  ?  ?  ?  ?  ?  ?  ?  ?  ?  ?  ?  ? ?  ?Prior Functioning/Environment    ?  ?  ?  ?   ? ?Frequency ? Min 2X/week  ? ? ? ? ?  ?Progress Toward Goals ? ?OT Goals(current goals can now be found in the care plan section) ? Progress towards OT goals: Progressing toward goals ? ?Acute Rehab OT Goals ?Patient Stated Goal: decrease pain ?OT Goal Formulation: With patient ?Time For Goal Achievement: 09/29/21 ?Potential to Achieve Goals: Good ?ADL Goals ?Pt Will Perform Upper Body Dressing: with modified independence;sitting;standing ?Pt Will Perform Lower Body Dressing: with modified independence;sit to/from stand;sitting/lateral leans ?Pt Will Transfer to Toilet: with modified independence;ambulating;regular height toilet ?Pt Will Perform Tub/Shower Transfer: with modified independence;Shower transfer;Tub transfer;rolling walker;ambulating;3 in 1  ?Plan Frequency needs to be updated;Discharge plan needs to be updated   ? ?Co-evaluation ? ? ?   ?  ?   ?  ?  ? ?  ?AM-PAC OT "6 Clicks" Daily Activity     ?Outcome Measure ? ? Help from another person eating meals?: None ?Help from another person taking care of personal grooming?: None ?Help from another person toileting, which includes using toliet, bedpan, or urinal?: None ?Help from another person bathing (including washing, rinsing, drying)?: A Little ?Help from another person to put on and taking off regular upper body clothing?: A Little ?Help from another person to put on and taking off regular lower body clothing?: A Lot ?6 Click Score: 20 ? ?  ?End of Session Equipment Utilized During Treatment: Gait belt;Rolling walker (2 wheels) ? ?OT Visit Diagnosis: Unsteadiness on feet (R26.81);Other abnormalities of gait and mobility (R26.89);Muscle weakness (generalized) (M62.81);Pain ?  ?Activity Tolerance Patient tolerated treatment well ?  ?Patient Left with call bell/phone within reach;in bed;with bed alarm set ?  ?Nurse Communication Mobility status;Patient requests pain meds ?  ? ?   ? ?Time: 1610-9604 ?OT Time Calculation (min): 24 min ? ?Charges: OT General Charges ?$OT Visit: 1 Visit ?OT Treatments ?$Self Care/Home Management : 8-22 mins ?$Therapeutic Activity: 8-22 mins ? ?Alfonzo Beers, OTD, OTR/L ?Acute Rehab ?[phone] ? ? ?Kimberly Gala  Nelani Booker ?09/20/2021, 4:48 PM ?

## 2021-09-20 NOTE — Progress Notes (Signed)
[facility] ?PROGRESS NOTE ? ?Kimberly Booker VJK:[id number] DOB: 1985-07-26 DOA: 08/26/2021 ?PCP: Pcp, No ? ?Status: ?Remains inpatient appropriate because:  ?Unsafe discharge plan.  Patient with history of polysubstance abuse and therefore deemed inappropriate to discharge with PICC line in place for prolonged IV antibiotics.  ID documents last doses are due on 10/29/2021 ? ?Barriers to discharge: ?Social: ?None-she plans to discharge home with father in the [facility] [place] area ? ?Clinical: ?Continues to require IV antibiotics to treat multiple infections including MRSA bacteremia ? ?Level of care: Med-Surg ? ? ?Code Status: Full ?Family Communication: Patient only ?DVT prophylaxis: Lovenox ?COVID vaccination status: No immunizations on file ? ?HPI: ?36 year old with past medical history significant for anxiety, depression, suicidal attempt drug overdose, polysubstance abuse, right clavicle fracture, was admitted at [facility] on 08/15/2021 due to right shoulder abscess.She was treated with I&D in the emergency department followed by IV antibiotics therapy as an inpatient.  She was diagnosed with MRSA bacteremia, endocarditis was ruled out with a TEE performed 3/16, but she continued to have positive blood cultures despite treatment with ceftriaxone and vancomycin.  Antibiotic therapy was changed to daptomycin.  She was then transferred to [facility]. ?Extensive imagings have been done here which showed right clavicle osteomyelitis/phlegmon/abscess, widespread cervical, thoracic, lumbar discitis/osteomyelitis/epidural abscess/cord impingement.  MRI also showed bilateral iliopsoas abscess, right complex pleural effusion.  ID, orthopedics, neurosurgery following. ?Underwent lumbar laminectomy/debridement of abscess, L4-L5 on 3/25. Hospital course also remarkable for finding of right-sided empyema, gastric perforation. Status post emergent laparotomy and perforation of repair by general  surgery.  PCCM was also following for chest tube on the right side, which was removed on 3/29. ?Underwent aspiration of the psoas abscesses on 3/30.  ?Underwent debridement of the right clavicle and application of wound VAC to the right shoulder area on 3/31. ?Developed severe lower back pain on 4/2.  MRI was done which showed improvement in the abscess.  Symptoms have improved.. ? ?Subjective: ?Reports pain controlled but uniquely states she is having back pain throughout her IV vancomycin infusion.  Unclear as to why this is occurring.  Patient stated she wanted her Lyrica changed to Neurontin ? ?Objective: ?Vitals:  ? 09/19/21 2048 09/20/21 0240  ?BP: 100/63 101/67  ?Pulse: 80 81  ?Resp: 18 18  ?Temp: 98.7 ?F (37.1 ?C)   ?SpO2: 98% 98%  ? ? ?Intake/Output Summary (Last 24 hours) at 09/20/2021 0800 ?Last data filed at 09/20/2021 0000 ?Gross per 24 hour  ?Intake 1253.4 ml  ?Output 0 ml  ?Net 1253.4 ml  ? ? ?Filed Weights  ? 08/28/21 1059 08/29/21 1417 09/02/21 1010  ?Weight: 86.4 kg 86.4 kg 77.1 kg  ? ? ?Exam: ?Constitutional: Thin, currently comfortable ?Respiratory: clear to auscultation bilaterally, no wheezing, no crackles. Normal respiratory effort. No accessory muscle use.  Room air ?Cardiovascular: S1-S2, normotensive, regular pulse, no peripheral edema ?Musculoskeletal: Wound VAC to right anterior shoulder ?Abdomen: no tenderness, no masses palpated. Bowel sounds positive. LBM 4/16 ?Neurologic: Sleeping but at baseline CN 2-12 grossly intact. Sensation intact, DTR normal. Strength 5/5 x all 4 extremities.  ?Psychiatric: Sleeping but at baseline normal judgment and insight. Alert and oriented x 3. Normal mood.  ? ? ?Assessment/Plan: ?MRSA bacteremia ?Patient has disseminated MRSA bacteremia.  ?TEE at outside facility on 3/16, negative for endocarditis as per the report  ?TTE did not show any vegetation.  Per ID note dated 4/4 no clear indication for repeat TEE given suspected source control. ?Per ID  patient  will need a total of 8 weeks of antibiotic therapy starting from date of last surgery on 3/31 with tentative end date 5/26-of note blood cultures negative x2 on 3/24.  Repeat blood cultures obtained on 4/9 and are pending but no growth to date ?Patient with extremely poor dentition and this could be the source of her bacteremia ?  ?Gastric perforation  ?Status postsurgical repair ?Currently tolerating soft diet ?  ?Abscess in epidural space of cervical spine ?Discitis/osteomyelitis at C2-3, C5-6, and C6-7. Ventral epidural abscess at C2-3, C5, and C6 with cord impingement especially at the lower 2 levels. Facet arthritis on the left at C7-T1 and right at C3-4. ?-Also showed discitis/osteomyelitis of T9- T10, T11-T12 ?-Seen by neurosurgery previously.  No focal neurological deficits noted  ?  ?Abscess in epidural space of lumbar spine/ongoing lumbar back pain/physical deconditioning ?Imaging review lumbar discitis as well as dorsal epidural abscess at L4-L5 which was compressive on the thecal sac S/P  lumbar laminectomy, debridement of abscess, L4-L5 on 3/25.   ?Continue Duragesic patch, as needed Percocet, scheduled Robaxin oral and at patient's request we will change Lyrica to Neurontin 200 mg before meals and at bedtime ?Improved ability to mobilize and participate with therapy with use of corset brace ?  ?Iliopsoas abscess /Sacroiliac septic arthritis/osteomyelitis ? Pelvic MRI showed bilateral infectious sacroiliitis and osteomyelitis of the sacrum and periarticular right iliac bone. Bilateral psoas abscesses, Small bilateral paraspinal muscle abscesses, intramuscular abscess within the left piriformis muscle, small intramuscular abscess within the left gluteus medius muscle. ?Seen by interventional radiology and underwent aspiration on 3/30.  MRSA noted on cultures.   ?  ?Pleural effusion ?Chest CT showed a large loculated right pleural effusion concerning for empyema, subcentimeter groundglass nodularity  within the bilateral lungs concerning for septic emboli.  ?S/P chest tube -  Fluid exudative.  Cultures negative ?Chest x-ray showed resolution of the effusion.  Chest tube was discontinued on 3/29.  Respiratory status is stable. ?  ?Normocytic anemia ?Received 2 units PRBC at outside facility.  ?Hemoglobin stable at 8.5 ?  ?Anxiety with depression ?Continue Prozac and Ativan prn ?  ?Acute osteomyelitis of clavicle, right (HCC) ?MRI of the shoulder showed osteomyelitis, phlegmon, possible abscess.  Orthopedics following.  Underwent debridement of right clavicle and application of wound VAC on 3/31.  Cultures no growth. ?  ?Polysubstance abuse  ?Patient reports last drug use was 1 year ago. ?  ? ? ? ?A physical therapy consult is indicated based on the patient?s mobility assessment. ? ? ?Mobility Assessment (last 72 hours)   ? ? Mobility Assessment   ? ? Row Name 09/19/21 2130 09/19/21 1000 09/18/21 0800 09/17/21 1600 09/17/21 0822  ? Does patient have an order for bedrest or is patient medically unstable No - Continue assessment No - Continue assessment No - Continue assessment -- No - Continue assessment  ? What is the highest level of mobility based on the progressive mobility assessment? Level 5 (Walks with assist in room/hall) - Balance while stepping forward/back and can walk in room with assist - Complete Level 5 (Walks with assist in room/hall) - Balance while stepping forward/back and can walk in room with assist - Complete Level 2 (Chairfast) - Balance while sitting on edge of bed and cannot stand Level 5 (Walks with assist in room/hall) - Balance while stepping forward/back and can walk in room with assist - Complete Level 5 (Walks with assist in room/hall) - Balance while stepping forward/back and can walk  in room with assist - Complete  ? Is the above level different from baseline mobility prior to current illness? -- -- No - Consider discontinuing PT/OT -- No - Consider discontinuing PT/OT  ? ?  ?  ? ?   ? ? ? ?  ?Transaminitis ?HCV antibody positive ?HCV quantitative was not detected. ?LFTs noted to be normal on 4/4. ?  ? ? ? ?Data Reviewed: ?Basic Metabolic Panel: ?Recent Labs  ?Lab 09/15/21 ?0248 09/20/21 ?

## 2021-09-20 NOTE — Progress Notes (Signed)
Physical Therapy Treatment ?Patient Details ?Name: Kimberly Booker ?MRN: [id number] ?DOB: [birth year] ?Today's Date: 09/20/2021 ? ? ?History of Present Illness Pt is a 36 y.o. female initially admitted to [facility] 08/15/21 with R shoulder abscess; admit to [facility] 08/26/21 with MRSA bacteremia. 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 S/p L4-5 laminectomy and abscess debridement 3/25. Pt with pleural effusion s/p R pigtail chest tube 3/26. Abdominal CT showed bowel perforation; s/p ex lap with patch repair on 3/27. S/p R psoas abscess drainage on 3/30. S/p R clavicle I&D with wound vac placement 3/31. Pt developed severe LBP on 4/2; MRI showed improvement in abscess. PMH includes anxiety, depression, polysubstance abuse, R clavicle fx ~8 mo prior to admission. ? ?  ?PT Comments  ? ? Pt was seen for mobility but declined, although she did agree to get through exercises.  Pt is returning the routine demonstration with cues for spinal protection, and  is verbalizing instructions back to PT.  Her effort is good but does need to be up walking today.  Follow along with her for progression of gait and continued strengthening esp to B hips.  Pt voices understanding of exercises and the return on demonstration.     ?Recommendations for follow up therapy are one component of a multi-disciplinary discharge planning process, led by the attending physician.  Recommendations may be updated based on patient status, additional functional criteria and insurance authorization. ? ?Follow Up Recommendations ? Home health PT ?  ?  ?Assistance Recommended at Discharge Intermittent Supervision/Assistance  ?Patient can return home with the following A little help with walking and/or transfers;Assistance with cooking/housework;Assist for transportation;Help with stairs or ramp for entrance;A little help with bathing/dressing/bathroom ?  ?Equipment Recommendations ? Rolling walker (2 wheels)  ?  ?Recommendations for Other  Services   ? ? ?  ?Precautions / Restrictions Precautions ?Precautions: Fall;Back;Other (comment) ?Precaution Comments: R clavicle wound vac; abdominal precautions for comfort, lumbar corset for comfort ?Required Braces or Orthoses: Spinal Brace ?Spinal Brace: Lumbar corset ?Restrictions ?Weight Bearing Restrictions: Yes ?RUE Weight Bearing: Weight bearing as tolerated  ?  ? ?Mobility ? Bed Mobility ?  ?  ?  ?  ?  ?  ?  ?General bed mobility comments: declining OOB ?  ? ?Transfers ?  ?  ?  ?  ?  ?  ?  ?  ?  ?General transfer comment: declining OOB ?  ? ?Ambulation/Gait ?  ?  ?  ?  ?  ?  ?  ?  ? ? ?Stairs ?  ?  ?  ?  ?  ? ? ?Wheelchair Mobility ?  ? ?Modified Rankin (Stroke Patients Only) ?  ? ? ?  ?Balance   ?  ?  ?  ?  ?  ?  ?  ?  ?  ?  ?  ?  ?  ?  ?  ?  ?  ?  ?  ? ?  ?Cognition Arousal/Alertness: Awake/alert ?Behavior During Therapy: [facility] for tasks assessed/performed ?Overall Cognitive Status: Within Functional Limits for tasks assessed ?  ?  ?  ?  ?  ?  ?  ?  ?  ?  ?  ?  ?  ?  ?  ?  ?General Comments: declines OOB ?  ?  ? ?  ?Exercises General Exercises - Lower Extremity ?Ankle Circles/Pumps: AROM, 5 reps ?Quad Sets: AROM, 10 reps ?Gluteal Sets: AROM, 10 reps ?Heel Slides: AROM, 10 reps ?Hip ABduction/ADduction: AROM,  10 reps ? ?  ?General Comments   ?  ?  ? ?Pertinent Vitals/Pain Pain Assessment ?Pain Assessment: Faces ?Faces Pain Scale: Hurts little more ?Breathing: normal ?Negative Vocalization: none ?Facial Expression: smiling or inexpressive ?Body Language: relaxed ?Consolability: no need to console ?PAINAD Score: 0 ?Pain Location: back ?Pain Descriptors / Indicators: Guarding ?Pain Intervention(s): Limited activity within patient's tolerance, Premedicated before session, Repositioned, Monitored during session  ? ? ?Home Living   ?  ?  ?  ?  ?  ?  ?  ?  ?  ?   ?  ?Prior Function    ?  ?  ?   ? ?PT Goals (current goals can now be found in the care plan section) Acute Rehab PT Goals ?Patient Stated Goal: to go  to rehab ?Progress towards PT goals: Not progressing toward goals - comment (does walk with MT) ? ?  ?Frequency ? ? ? Min 5X/week ? ? ? ?  ?PT Plan Current plan remains appropriate  ? ? ?Co-evaluation   ?  ?  ?  ?  ? ?  ?AM-PAC PT "6 Clicks" Mobility   ?Outcome Measure ? Help needed turning from your back to your side while in a flat bed without using bedrails?: None ?Help needed moving from lying on your back to sitting on the side of a flat bed without using bedrails?: None ?Help needed moving to and from a bed to a chair (including a wheelchair)?: A Little ?Help needed standing up from a chair using your arms (e.g., wheelchair or bedside chair)?: A Little ?Help needed to walk in hospital room?: A Little ?Help needed climbing 3-5 steps with a railing? : A Lot ?6 Click Score: 19 ? ?  ?End of Session   ?Activity Tolerance: Patient limited by fatigue ?Patient left: in bed;with call bell/phone within reach ?Nurse Communication: Mobility status ?PT Visit Diagnosis: Other abnormalities of gait and mobility (R26.89);Muscle weakness (generalized) (M62.81);Pain ?Pain - Right/Left: Right ?Pain - part of body: Shoulder ?  ? ? ?Time: 1040-1109 ?PT Time Calculation (min) (ACUTE ONLY): 29 min ? ?Charges:  $Therapeutic Exercise: 23-37 mins   ?Ivar Drape ?09/20/2021, 12:28 PM ? ?Samul Dada, PT PhD ?Acute Rehab Dept. Number: [facility] [phone] and [facility] [phone] ? ? ?

## 2021-09-20 NOTE — Progress Notes (Signed)
Pharmacy Antibiotic Note ? ?Kimberly Booker is a 36 y.o. female admitted on 08/26/2021 with  disseminated MRSA bacteremia (diffuse discitis/osteomyelitis/epidural abscess of cervical, thoracic and lumbar spine, bilateral septic arthritis, osteomyelitis of R clavicle, R lung empyema) .  ?  ?Patient was initially started on vancomycin 3/13-3/15 along with ceftriaxone 3/13-3/24. Vancomycin was transitioned to daptomycin and given 3/16-3/25. Pt was transferred to [facility] on 08/26/21. ID has consulted pharmacy dose vancomycin for coverage of MRSA R lung empyema.  ?  ?Patient is s/p placement of IR drains into iliopsoas abscesses 3/30 and debridement of right clavicle 3/31. Scr remains stable.  ?Tentative end date: 10/29/21 per ID recs (8 wks) ? ?VP 28 (true Peak 41), true trough 10.4 ?Calculated AUC 555 ? ?Plan: ?Continue Vancomycin 1500 mg IV q12h with therapeutic AUC ~555. ?Goal AUC 400-600 ?Plan to check levels weekly on Mondays. ?Monitor renal function and clinical signs of infection  ? ?Height: 5\' 7"  (170.2 cm) ?Weight: 77.1 kg (170 lb) ?IBW/kg (Calculated) : 61.6 ? ?Temp (24hrs), Avg:98.4 ?F (36.9 ?C), Min:98 ?F (36.7 ?C), Max:98.7 ?F (37.1 ?C) ? ?Recent Labs  ?Lab 09/15/21 ?0248 09/20/21 ?0224 09/20/21 ?0951  ?WBC 7.5  --   --   ?CREATININE 0.47 0.44  --   ?VANCOTROUGH  --   --  10*  ?VANCOPEAK  --  28*  --   ? ?  ?Estimated Creatinine Clearance: 104.1 mL/min (by C-G formula based on SCr of 0.44 mg/dL).   ? ?Allergies  ?Allergen Reactions  ? Amoxicillin Swelling  ?  Throat closes  ? ? ?Antimicrobials this admission: ?Vanc 3/13 >> 3/15 ?Ceftriaxone 3/13 >> 3/24 ?Daptomycin 3/16 >> 3/25 ?Vanc 3/26 >> ? ?Microbiology results: ?3/12 Bcx x2: 4/4 MRSA ?3/12 Chest wound cx: MRSA ?3/14 Bcx: 4/4 MRSA ?3/22 Bcx: ngtd ?3/24 Bcx: ngtd ?3/25 lumbar epidural wound/abscess: MRSA ?3/31 fungal cx: negative ?3/31 bone cx: negative ? ? ?Tarik Teixeira A. 4/31, PharmD, BCPS, FNKF ?Clinical Pharmacist ?[place] ?Please utilize Amion for  appropriate phone number to reach the unit pharmacist [facility]) ? ?

## 2021-09-21 NOTE — Progress Notes (Addendum)
[facility] ?PROGRESS NOTE ? ?PRINCES FINGER PIR:[id number] DOB: 1986/05/20 DOA: 08/26/2021 ?PCP: Pcp, No ? ?Status: ?Remains inpatient appropriate because:  ?Unsafe discharge plan.  Patient with history of polysubstance abuse and therefore deemed inappropriate to discharge with PICC line in place for prolonged IV antibiotics.  ID documents last doses are due on 10/29/2021 ? ?Barriers to discharge: ?Social: ?None-she plans to discharge home with father in the [facility] [place] area ? ?Clinical: ?Continues to require IV antibiotics to treat multiple infections including MRSA bacteremia ? ?Level of care: Med-Surg ? ? ?Code Status: Full ?Family Communication: Patient only ?DVT prophylaxis: Lovenox ?COVID vaccination status: No immunizations on file ? ?HPI: ?36 year old with past medical history significant for anxiety, depression, suicidal attempt drug overdose, polysubstance abuse, right clavicle fracture, was admitted at [facility] on 08/15/2021 due to right shoulder abscess.She was treated with I&D in the emergency department followed by IV antibiotics therapy as an inpatient.  She was diagnosed with MRSA bacteremia, endocarditis was ruled out with a TEE performed 3/16, but she continued to have positive blood cultures despite treatment with ceftriaxone and vancomycin.  Antibiotic therapy was changed to daptomycin.  She was then transferred to [facility]. ?Extensive imagings have been done here which showed right clavicle osteomyelitis/phlegmon/abscess, widespread cervical, thoracic, lumbar discitis/osteomyelitis/epidural abscess/cord impingement.  MRI also showed bilateral iliopsoas abscess, right complex pleural effusion.  ID, orthopedics, neurosurgery following. ?Underwent lumbar laminectomy/debridement of abscess, L4-L5 on 3/25. Hospital course also remarkable for finding of right-sided empyema, gastric perforation. Status post emergent laparotomy and perforation of repair by general  surgery.  PCCM was also following for chest tube on the right side, which was removed on 3/29. ?Underwent aspiration of the psoas abscesses on 3/30.  ?Underwent debridement of the right clavicle and application of wound VAC to the right shoulder area on 3/31. ?Developed severe lower back pain on 4/2.  MRI was done which showed improvement in the abscess.  Symptoms have improved.. ? ?Subjective: ?States Neurontin actually works better than Lyrica. ? ?Objective: ?Vitals:  ? 09/21/21 0330 09/21/21 0335  ?BP:  105/63  ?Pulse:  73  ?Resp:  18  ?Temp: 98.2 ?F (36.8 ?C)   ?SpO2:    ? ? ?Intake/Output Summary (Last 24 hours) at 09/21/2021 0828 ?Last data filed at 09/21/2021 0335 ?Gross per 24 hour  ?Intake --  ?Output 1200 ml  ?Net -1200 ml  ? ? ?Filed Weights  ? 08/28/21 1059 08/29/21 1417 09/02/21 1010  ?Weight: 86.4 kg 86.4 kg 77.1 kg  ? ? ?Exam: ?Constitutional: Thin, currently uncomfortable, very tired and appears ?Respiratory: clear to auscultation bilaterally, no wheezing, no crackles. Normal respiratory effort. No accessory muscle use.  Room air ?Cardiovascular: S1-S2, normotensive, regular pulse, no peripheral edema ?Musculoskeletal: Wound VAC to right anterior shoulder ?Abdomen: no tenderness, no masses palpated. Bowel sounds positive. LBM 4/16 ?Neurologic: Sleeping but at baseline CN 2-12 grossly intact. Sensation intact, Strength 3-4/5 x all 4 extremities.  ?Psychiatric: Sleeping but at baseline normal judgment and insight. Alert and oriented x 3.  Flat mood.  ? ? ?Assessment/Plan: ?MRSA bacteremia ?Patient has disseminated MRSA bacteremia.  ?TEE at outside facility on 3/16, negative for endocarditis as per the report  ?TTE did not show any vegetation.  Per ID note dated 4/4 no clear indication for repeat TEE given suspected source control. ?Per ID patient will need a total of 8 weeks of antibiotic therapy starting from date of last surgery on 3/31 with tentative end date 5/26-of note  blood cultures negative x2 on  3/24.  Repeat blood cultures obtained on 4/9 and are pending but no growth to date ?Patient with extremely poor dentition and this could be the source of her bacteremia ?  ?Gastric perforation  ?Status postsurgical repair ?Currently tolerating soft diet ?  ?Abscess in epidural space of cervical spine ?Discitis/osteomyelitis at C2-3, C5-6, and C6-7. Ventral epidural abscess at C2-3, C5, and C6 with cord impingement especially at the lower 2 levels. Facet arthritis on the left at C7-T1 and right at C3-4. ?-Also showed discitis/osteomyelitis of T9- T10, T11-T12 ?-Seen by neurosurgery previously.  No focal neurological deficits noted  ?  ?Abscess in epidural space of lumbar spine/ongoing lumbar back pain/physical deconditioning ?Imaging review lumbar discitis as well as dorsal epidural abscess at L4-L5 which was compressive on the thecal sac S/P  lumbar laminectomy, debridement of abscess, L4-L5 on 3/25.   ?Continue Duragesic patch, patient reports Neurontin working much better than Lyrica.  Continue Percocet and high-dose Robaxin ?In review of the literature infusion of IV vancomycin can cause back pain that is transient during the infusion ?Improved ability to mobilize and participate with therapy with use of corset brace ?  ?Iliopsoas abscess /Sacroiliac septic arthritis/osteomyelitis ? Pelvic MRI showed bilateral infectious sacroiliitis and osteomyelitis of the sacrum and periarticular right iliac bone. Bilateral psoas abscesses, Small bilateral paraspinal muscle abscesses, intramuscular abscess within the left piriformis muscle, small intramuscular abscess within the left gluteus medius muscle. ?Seen by interventional radiology and underwent aspiration on 3/30.  MRSA noted on cultures.   ?  ?Pleural effusion ?Chest CT showed a large loculated right pleural effusion concerning for empyema, subcentimeter groundglass nodularity within the bilateral lungs concerning for septic emboli.  ?S/P chest tube -  Fluid  exudative.  Cultures negative ?Chest x-ray showed resolution of the effusion.  Chest tube was discontinued on 3/29.  Respiratory status is stable. ?  ?Normocytic anemia ?Received 2 units PRBC at outside facility.  ?Hemoglobin stable at 8.5 ?  ?Anxiety with depression ?Continue Prozac and Ativan prn ?  ?Acute osteomyelitis of clavicle, right (HCC) ?MRI of the shoulder showed osteomyelitis, phlegmon, possible abscess.  Orthopedics following.  Underwent debridement of right clavicle and application of wound VAC on 3/31.  Cultures no growth. ?  ?Polysubstance abuse  ?Patient reports last drug use was 1 year ago. ?  ? ? ? ?A physical therapy consult is indicated based on the patient?s mobility assessment. ? ? ?Mobility Assessment (last 72 hours)   ? ? Mobility Assessment   ? ? Row Name 09/20/21 1600 09/19/21 2130 09/19/21 1000  ?  ?  ? Does patient have an order for bedrest or is patient medically unstable -- No - Continue assessment No - Continue assessment    ? What is the highest level of mobility based on the progressive mobility assessment? Level 5 (Walks with assist in room/hall) - Balance while stepping forward/back and can walk in room with assist - Complete Level 5 (Walks with assist in room/hall) - Balance while stepping forward/back and can walk in room with assist - Complete Level 5 (Walks with assist in room/hall) - Balance while stepping forward/back and can walk in room with assist - Complete    ? ?  ?  ? ?  ? ? ? ?  ?Transaminitis ?HCV antibody positive ?HCV quantitative was not detected. ?LFTs noted to be normal on 4/4. ?  ? ? ? ?Data Reviewed: ?Basic Metabolic Panel: ?Recent Labs  ?Lab 09/15/21 ?0248 09/20/21 ?0224  ?  NA 136 137  ?K 4.0 3.9  ?CL 103 106  ?CO2 27 25  ?GLUCOSE 101* 94  ?BUN 16 11  ?CREATININE 0.47 0.44  ?CALCIUM 8.6* 8.8*  ? ?Liver Function Tests: ?No results for input(s): AST, ALT, ALKPHOS, BILITOT, PROT, ALBUMIN in the last 168 hours. ? ?No results for input(s): LIPASE, AMYLASE in the  last 168 hours. ?No results for input(s): AMMONIA in the last 168 hours. ?CBC: ?Recent Labs  ?Lab 09/15/21 ?0248  ?WBC 7.5  ?NEUTROABS 4.8  ?HGB 8.5*  ?HCT 26.5*  ?MCV 93.0  ?PLT 366  ? ?Cardiac Enzymes: ?No resu

## 2021-09-21 NOTE — Plan of Care (Signed)

## 2021-09-21 NOTE — Progress Notes (Signed)
Mobility Specialist Progress Note  ? ? 09/21/21 1254  ?Mobility  ?Activity Ambulated with assistance in hallway  ?Level of Assistance Standby assist, set-up cues, supervision of patient - no hands on  ?Assistive Device Four wheel walker  ?Distance Ambulated (ft) 400 ft  ?Activity Response Tolerated well  ?$Mobility charge 1 Mobility  ? ?Pt received in bed and agreeable. No complaints. Had void in BR. Returned to bed with call bell in reach.  ? ?T[place]uca Nation ?Mobility Specialist  ?Primary: 5N M.S. Phone: [phone] ?Secondary: 6N M.S. Phone: [phone] ?  ?

## 2021-09-21 NOTE — Progress Notes (Signed)
Physical Therapy Treatment ?Patient Details ?Name: Kimberly Booker ?MRN: [id number] ?DOB: Oct 01, 1985 ?Today's Date: 09/21/2021 ? ? ?History of Present Illness Pt is a 36 y.o. female initially admitted to [facility] 08/15/21 with R shoulder abscess; admit to [facility] 08/26/21 with MRSA bacteremia. 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 S/p L4-5 laminectomy and abscess debridement 3/25. Pt with pleural effusion s/p R pigtail chest tube 3/26. Abdominal CT showed bowel perforation; s/p ex lap with patch repair on 3/27. S/p R psoas abscess drainage on 3/30. S/p R clavicle I&D with wound vac placement 3/31. Pt developed severe LBP on 4/2; MRI showed improvement in abscess. PMH includes anxiety, depression, polysubstance abuse, R clavicle fx ~8 mo prior to admission. ? ?  ?PT Comments  ? ? Pt was seen for gait but declined over having walked with MT.  However is having issues of pain on neck and R shoulder with both being in bed and with dealing with mm reaction to the infection process.  Talked with her about symptoms, and reviewed LE exercises from 4/17 along with adding ROM to neck and scapular areas.  Pt responded well with improved quality of movement and comfort.  Follow along to set up a sustainable routine and will plan to decrease frequency next session if appropriate.   ?Recommendations for follow up therapy are one component of a multi-disciplinary discharge planning process, led by the attending physician.  Recommendations may be updated based on patient status, additional functional criteria and insurance authorization. ? ?Follow Up Recommendations ? Home health PT ?  ?  ?Assistance Recommended at Discharge Intermittent Supervision/Assistance  ?Patient can return home with the following A little help with walking and/or transfers;Assistance with cooking/housework;Assist for transportation ?  ?Equipment Recommendations ? Rollator (4 wheels)  ?  ?Recommendations for Other Services   ? ? ?   ?Precautions / Restrictions Precautions ?Precautions: Fall;Back;Other (comment) ?Precaution Comments: R clavicle wound vac; abdominal precautions for comfort, lumbar corset for comfort ?Required Braces or Orthoses: Spinal Brace ?Spinal Brace: Lumbar corset ?Restrictions ?Weight Bearing Restrictions: Yes ?RUE Weight Bearing: Weight bearing as tolerated  ?  ? ?Mobility ? Bed Mobility ?Overal bed mobility: Modified Independent ?Bed Mobility: Supine to Sit, Sit to Supine ?  ?  ?Supine to sit: Modified independent (Device/Increase time) ?Sit to supine: Modified independent (Device/Increase time) ?  ?General bed mobility comments: independent to sit on side of bed ?  ? ?Transfers ?  ?  ?  ?  ?  ?  ?  ?  ?  ?General transfer comment: declined ?  ? ?Ambulation/Gait ?  ?  ?  ?  ?  ?  ?  ?General Gait Details: declined ? ? ?Stairs ?  ?  ?  ?  ?  ? ? ?Wheelchair Mobility ?  ? ?Modified Rankin (Stroke Patients Only) ?  ? ? ?  ?Balance Overall balance assessment: Needs assistance ?Sitting-balance support: Feet supported ?Sitting balance-Leahy Scale: Good ?  ?  ?  ?  ?  ?  ?  ?  ?  ?  ?  ?  ?  ?  ?  ?  ?  ? ?  ?Cognition Arousal/Alertness: Awake/alert ?Behavior During Therapy: [facility] for tasks assessed/performed ?Overall Cognitive Status: Within Functional Limits for tasks assessed ?  ?  ?  ?  ?  ?  ?  ?  ?  ?  ?  ?  ?  ?  ?  ?  ?General Comments: declines OOB ?  ?  ? ?  ?  Exercises General Exercises - Lower Extremity ?Ankle Circles/Pumps: AROM, 5 reps ?Quad Sets: AROM, 10 reps ?Gluteal Sets: AROM, 10 reps ?Heel Slides: AROM, 10 reps ?Hip ABduction/ADduction: AROM, 10 reps ?Straight Leg Raises: AAROM, 10 reps ?Other Exercises ?Other Exercises: ROM to neck, scapular ROM to depress and adduct ? ?  ?General Comments   ?  ?  ? ?Pertinent Vitals/Pain Pain Assessment ?Pain Assessment: Faces ?Faces Pain Scale: Hurts little more ?Breathing: normal ?Negative Vocalization: none ?Pain Location: neck and R shoulder ?Pain Descriptors /  Indicators: Guarding, Discomfort, Constant ?Pain Intervention(s): Limited activity within patient's tolerance, Monitored during session, Repositioned  ? ? ?Home Living   ?  ?  ?  ?  ?  ?  ?  ?  ?  ?   ?  ?Prior Function    ?  ?  ?   ? ?PT Goals (current goals can now be found in the care plan section) Acute Rehab PT Goals ?Patient Stated Goal: to go to rehab ?Progress towards PT goals: Progressing toward goals ? ?  ?Frequency ? ? ? Min 5X/week ? ? ? ?  ?PT Plan Current plan remains appropriate  ? ? ?Co-evaluation   ?  ?  ?  ?  ? ?  ?AM-PAC PT "6 Clicks" Mobility   ?Outcome Measure ? Help needed turning from your back to your side while in a flat bed without using bedrails?: None ?Help needed moving from lying on your back to sitting on the side of a flat bed without using bedrails?: A Little ?Help needed moving to and from a bed to a chair (including a wheelchair)?: A Little ?Help needed standing up from a chair using your arms (e.g., wheelchair or bedside chair)?: A Little ?Help needed to walk in hospital room?: A Little ?Help needed climbing 3-5 steps with a railing? : A Lot ?6 Click Score: 18 ? ?  ?End of Session   ?Activity Tolerance: Patient limited by fatigue ?Patient left: in bed;with call bell/phone within reach ?Nurse Communication: Mobility status ?PT Visit Diagnosis: Other abnormalities of gait and mobility (R26.89);Muscle weakness (generalized) (M62.81);Pain ?Pain - Right/Left: Right ?Pain - part of body: Shoulder ?  ? ? ?Time: 2703-5009 ?PT Time Calculation (min) (ACUTE ONLY): 32 min ? ?Charges:  $Therapeutic Exercise: 23-37 mins  ?Ivar Drape ?09/21/2021, 7:37 PM ? ?Samul Dada, PT PhD ?Acute Rehab Dept. Number: [facility] [phone] and [facility] [phone] ? ? ?

## 2021-09-22 ENCOUNTER — Inpatient Hospital Stay: Payer: Self-pay

## 2021-09-22 DIAGNOSIS — B9562 Methicillin resistant Staphylococcus aureus infection as the cause of diseases classified elsewhere: Secondary | ICD-10-CM | POA: Diagnosis not present

## 2021-09-22 DIAGNOSIS — R7881 Bacteremia: Secondary | ICD-10-CM | POA: Diagnosis not present

## 2021-09-22 MED ORDER — CHLORHEXIDINE GLUCONATE CLOTH 2 % EX PADS
6.0000 | MEDICATED_PAD | Freq: Every day | CUTANEOUS | Status: DC
  Administered 2021-09-22 – 2021-10-28 (×37): 6 via TOPICAL

## 2021-09-22 MED ORDER — SODIUM CHLORIDE 0.9% FLUSH
10.0000 mL | INTRAVENOUS | Status: DC | PRN
  Administered 2021-09-29 – 2021-10-28 (×6): 10 mL

## 2021-09-22 MED ORDER — SODIUM CHLORIDE 0.9% FLUSH
10.0000 mL | Freq: Two times a day (BID) | INTRAVENOUS | Status: DC
  Administered 2021-09-22 – 2021-09-28 (×10): 10 mL
  Administered 2021-09-29: 20 mL
  Administered 2021-09-30 – 2021-10-29 (×47): 10 mL

## 2021-09-22 NOTE — Progress Notes (Signed)
Nutrition Follow-up ? ?DOCUMENTATION CODES:  ?Not applicable ? ?INTERVENTION:  ?-continue Ensure Enlive po BID, each supplement provides 350 kcal and 20 grams of protein. ?-continue MVI with minerals daily ? ?NUTRITION DIAGNOSIS:  ?Increased nutrient needs related to acute illness, post-op healing, wound healing as evidenced by estimated needs. ?ongoing ? ?GOAL:  ?Patient will meet greater than or equal to 90% of their needs ?progressing ? ?MONITOR:  ?Diet advancement, PO intake, Supplement acceptance, Labs, Weight trends, Skin ? ?REASON FOR ASSESSMENT:  ?Consult ?Assessment of nutrition requirement/status ? ?ASSESSMENT:  ?36 year old female with medical history of anxiety, depression, suicide attempt by drug OD, polysubstance abuse, R clavicle fx 8 months ago. She presented to the ED ongoing R shoulder pain. She had gone to another hospital 2 weeks ago and found to have R shoulder abscess which was treated with I&D and IV abx. She was dx with MRSA bacteremia and endocarditis was r/o on TEE on 3/16. She was transferred from [facility] to [facility] due to ongoing positive blood cultures despite treatment. Once at [facility], extensive imaging showed R clavicle osteomyelitis/phlegmon/abscess, widespread cervical,thoracic,lumbar discitis/osteomyelitis/epidural abscess/cord impingement. MRI showed bilateral iliopsoas abscess, R complex pleural effusion. ID, Orthopedics, and Neurosurgery following.  Currently on daptomycin. Plan for transthoracic echo. Neurosurgery planning for lumbar laminectomy for debridement of abscess, L4-L5. ? ?3/25 - s/p lumbar laminectomy/debridement of abscess, L4-L5 ?3/27 - s/p dx laparoscopy, exploratory laparotomy, graham patch repair of gastric ulcer perforation; NGT placed to suction ?3/30 - NGT removed ?3/31 - s/p debridement R clavicle and application of woundVAC ?4/02 - MRI spine redemonstrated osteomyelitis at T11-12 and L5-S1 ? ?Pt reports feeling well today and is to  remain in the hospital until 5/26 for IV abx.  ? ?Per NP and ID, pt to remain inpatient until completion of IV abx until 10/29/2021 -- pt with h/o of polysubstance abuse. Since last RD assessment, pt has completed 0-100% of meals x last 8 documented meals (65% avg meal intake). Per RN, pt continues to do well with ONS. Recommend continue current nutrition plan of care at this time.  ? ?UOP: 3x unmeasured occurrences x24 hours ?woundVAC: 170ml x 24 hours ?I/O: +3235ml since admit ? ?Medications: Ensure Enlive BID, mvi with minerals, protonix, miralax, senokot-S, IV abx ?Labs: ?Recent Labs  ?Lab 09/20/21 ?0224  ?NA 137  ?K 3.9  ?CL 106  ?CO2 25  ?BUN 11  ?CREATININE 0.44  ?CALCIUM 8.8*  ?GLUCOSE 94  ? ?Diet Order:   ?Diet Order   ? ?       ?  DIET SOFT Room service appropriate? Yes; Fluid consistency: Thin  Diet effective now       ?  ? ?  ?  ? ?  ? ?EDUCATION NEEDS:  ?Not appropriate for education at this time ? ?Skin:  Skin Assessment: Skin Integrity Issues: ?Skin Integrity Issues:: Incisions ?Incisions: throat, back, abdomen ? ?Last BM:  4/18 ? ?Height:  ?Ht Readings from Last 1 Encounters:  ?09/02/21 5\' 7"  (1.702 m)  ? ?Weight:  ?Wt Readings from Last 1 Encounters:  ?09/02/21 77.1 kg  ? ?Ideal Body Weight:  61.4 kg ? ?BMI:  Body mass index is 26.63 kg/m?. ? ?Estimated Nutritional Needs:  ?Kcal:  2400-2600 kcal ?Protein:  120-130 grams ?Fluid:  >/= 2.5 L/day ? ? ? ? ?Theone Stanley., MS, RD, LDN (she/her/hers) ?RD pager number and weekend/on-call pager number located in [place]. ?

## 2021-09-22 NOTE — Progress Notes (Signed)
Patient remains hospitalized for IV antibiotics through 10/29/2021. There are no other TOC needs known at this time, if needs arise please notify DTP staff. ? ?Edwin Dada, MSW, LCSW ?Transitions of Care  Clinical Social Worker II ?[phone] ? ?

## 2021-09-22 NOTE — Progress Notes (Signed)
Mobility Specialist Progress Note  ? ? 09/22/21 1702  ?Mobility  ?Activity  ?(Ambulated with assistance outside)  ?Level of Assistance Minimal assist, patient does 75% or more  ?Assistive Device Front wheel walker  ?Distance Ambulated (ft) 130 ft  ?Activity Response Tolerated well  ?$Mobility charge 1 Mobility  ? ?Pt received in bed and agreeable. Had void in BR. Wheeled outside in [facility]. Took no rest breaks during ambulation. Returned to room in [facility] and left in bed with call bell in reach.  ? ?Hildred Alamin ?Mobility Specialist  ?Primary: 5N M.S. Phone: [phone] ?Secondary: 6N M.S. Phone: [phone] ?  ?

## 2021-09-22 NOTE — Progress Notes (Signed)
PT Cancellation Note ? ?Patient Details ?Name: Kimberly Booker ?MRN: [id number] ?DOB: 14-Nov-1985 ? ? ?Cancelled Treatment:    Reason Eval/Treat Not Completed: Other (comment).  Pt was attempted but is eating her meal and will retry at another time. ? ? ?Ivar Drape ?09/22/2021, 12:14 PM ? ?Samul Dada, PT PhD ?Acute Rehab Dept. Number: [facility] [phone] and [facility] [phone] ? ?

## 2021-09-22 NOTE — Progress Notes (Signed)
Physical Therapy Treatment ?Patient Details ?Name: Kimberly Booker ?MRN: [id number] ?DOB: 14-Apr-1986 ?Today's Date: 09/22/2021 ? ? ?History of Present Illness Pt is a 36 y.o. female initially admitted to [facility] 08/15/21 with R shoulder abscess; admit to [facility] 08/26/21 with MRSA bacteremia. 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 S/p L4-5 laminectomy and abscess debridement 3/25. Pt with pleural effusion s/p R pigtail chest tube 3/26. Abdominal CT showed bowel perforation; s/p ex lap with patch repair on 3/27. S/p R psoas abscess drainage on 3/30. S/p R clavicle I&D with wound vac placement 3/31. Pt developed severe LBP on 4/2; MRI showed improvement in abscess. PMH includes anxiety, depression, polysubstance abuse, R clavicle fx ~8 mo prior to admission. ? ?  ?PT Comments  ? ? Pt is making good progress with ROM to neck and upper body across scapulae, resulting in more comfort to move onto and off the bed, to get to BR and to walk with MT.  Follow along with her to reduce frequency of painful flareups of neck and upper/lower back muscles, to encourage more safe and independent movement and to enhance her ability to return home with home therapy as needed as she completes her stay in hosp.  Consider dropping frequency tomorrow.   ?Recommendations for follow up therapy are one component of a multi-disciplinary discharge planning process, led by the attending physician.  Recommendations may be updated based on patient status, additional functional criteria and insurance authorization. ? ?Follow Up Recommendations ? Home health PT ?  ?  ?Assistance Recommended at Discharge Intermittent Supervision/Assistance  ?Patient can return home with the following A little help with walking and/or transfers;Assistance with cooking/housework;Assist for transportation ?  ?Equipment Recommendations ? Rollator (4 wheels)  ?  ?Recommendations for Other Services   ? ? ?  ?Precautions / Restrictions  Precautions ?Precautions: Fall;Back;Other (comment) ?Precaution Comments: R clavicle wound vac; abdominal precautions for comfort, lumbar corset for comfort ?Required Braces or Orthoses: Spinal Brace ?Spinal Brace: Lumbar corset ?Restrictions ?Weight Bearing Restrictions: Yes ?RUE Weight Bearing: Weight bearing as tolerated  ?  ? ?Mobility ? Bed Mobility ?Overal bed mobility: Modified Independent ?  ?  ?  ?  ?  ?  ?General bed mobility comments: reviewed protective body mechanics ?  ? ?Transfers ?  ?  ?  ?  ?  ?  ?  ?  ?  ?  ?  ? ?Ambulation/Gait ?  ?  ?  ?  ?  ?  ?  ?  ? ? ?Stairs ?  ?  ?  ?  ?  ? ? ?Wheelchair Mobility ?  ? ?Modified Rankin (Stroke Patients Only) ?  ? ? ?  ?Balance Overall balance assessment: Needs assistance ?Sitting-balance support: Feet supported ?Sitting balance-Leahy Scale: Good ?  ?  ?Standing balance support: Single extremity supported ?Standing balance-Leahy Scale: Fair ?  ?  ?  ?  ?  ?  ?  ?  ?  ?  ?  ?  ?  ? ?  ?Cognition Arousal/Alertness: Awake/alert ?Behavior During Therapy: [facility] for tasks assessed/performed ?Overall Cognitive Status: Within Functional Limits for tasks assessed ?  ?  ?  ?  ?  ?  ?  ?  ?  ?  ?  ?  ?  ?  ?  ?  ?  ?  ?  ? ?  ?Exercises General Exercises - Lower Extremity ?Ankle Circles/Pumps: AROM, 5 reps ?Gluteal Sets: AROM, 10 reps ?Hip ABduction/ADduction: AROM, 10 reps ?  Other Exercises ?Other Exercises: ROM to neck, scapular ROM to depress and adduct ? ?  ?General Comments General comments (skin integrity, edema, etc.): pt has clean bandage and vac on R shoulder, new application of PICC on LUE ?  ?  ? ?Pertinent Vitals/Pain Pain Assessment ?Pain Assessment: Faces ?Faces Pain Scale: Hurts little more ?Breathing: normal ?Negative Vocalization: none ?Facial Expression: smiling or inexpressive ?Pain Location: R shoulder and Low back ?Pain Descriptors / Indicators: Guarding, Aching ?Pain Intervention(s): Monitored during session, Repositioned (applied low back brace)   ? ? ?Home Living   ?  ?  ?  ?  ?  ?  ?  ?  ?  ?   ?  ?Prior Function    ?  ?  ?   ? ?PT Goals (current goals can now be found in the care plan section) Acute Rehab PT Goals ?Patient Stated Goal: manage low back pain ? ?  ?Frequency ? ? ? Min 5X/week ? ? ? ?  ?PT Plan Current plan remains appropriate  ? ? ?Co-evaluation   ?  ?  ?  ?  ? ?  ?AM-PAC PT "6 Clicks" Mobility   ?Outcome Measure ? Help needed turning from your back to your side while in a flat bed without using bedrails?: None ?Help needed moving from lying on your back to sitting on the side of a flat bed without using bedrails?: None ?Help needed moving to and from a bed to a chair (including a wheelchair)?: None ?Help needed standing up from a chair using your arms (e.g., wheelchair or bedside chair)?: A Little ?Help needed to walk in hospital room?: A Little ?Help needed climbing 3-5 steps with a railing? : A Lot ?6 Click Score: 20 ? ?  ?End of Session Equipment Utilized During Treatment: Back brace (for comfort) ?Activity Tolerance: Patient tolerated treatment well ?Patient left: in bed;with call bell/phone within reach;with bed alarm set ?Nurse Communication: Mobility status ?PT Visit Diagnosis: Other abnormalities of gait and mobility (R26.89);Muscle weakness (generalized) (M62.81);Pain ?Pain - Right/Left: Right ?Pain - part of body: Shoulder ?  ? ? ?Time: 1232-1300 ?PT Time Calculation (min) (ACUTE ONLY): 28 min ? ?Charges:  $Therapeutic Exercise: 23-37 mins ?Ivar Drape ?09/22/2021, 4:50 PM ? ?Samul Dada, PT PhD ?Acute Rehab Dept. Number: [facility] [phone] and [facility] [phone] ? ? ?

## 2021-09-22 NOTE — Plan of Care (Signed)

## 2021-09-22 NOTE — Plan of Care (Signed)

## 2021-09-22 NOTE — Progress Notes (Signed)
Peripherally Inserted Central Catheter Placement ? ?The IV Nurse has discussed with the patient and/or persons authorized to consent for the patient, the purpose of this procedure and the potential benefits and risks involved with this procedure.  The benefits include less needle sticks, lab draws from the catheter, and the patient may be discharged home with the catheter. Risks include, but not limited to, infection, bleeding, blood clot (thrombus formation), and puncture of an artery; nerve damage and irregular heartbeat and possibility to perform a PICC exchange if needed/ordered by physician.  Alternatives to this procedure were also discussed.  Bard Power PICC patient education guide, fact sheet on infection prevention and patient information card has been provided to patient /or left at bedside.   ? ?PICC Placement Documentation  ?PICC Single Lumen 09/22/21 Left Brachial 38 cm 1 cm (Active)  ?Indication for Insertion or Continuance of Line Prolonged intravenous therapies 09/22/21 1200  ?Exposed Catheter (cm) 1 cm 09/22/21 1200  ?Site Assessment Clean, Dry, Intact 09/22/21 1200  ?Line Status Flushed;Saline locked;Blood return noted 09/22/21 1200  ?Dressing Type Securing device;Transparent 09/22/21 1200  ?Dressing Status Antimicrobial disc in place 09/22/21 1200  ?Line Care Connections checked and tightened 09/22/21 1200  ?Dressing Intervention New dressing 09/22/21 1200  ?Dressing Change Due 09/29/21 09/22/21 1200  ? ? ? ? ? ?Kimberly Booker ?09/22/2021, 12:28 PM ? ?

## 2021-09-22 NOTE — Progress Notes (Signed)
?PROGRESS NOTE ? ? ? ?Kimberly PotashDiana G Booker  WUJ:[id number] DOB: [birth year] DOA: 08/26/2021 ?PCP: Pcp, No  ? ?  ?Brief Narrative:  ?Kimberly Booker is a 36 year old with past medical history significant for anxiety, depression, suicidal attempt drug overdose, polysubstance abuse, right clavicle fracture, was admitted [facility] 08/15/2021 due to right shoulder abscess. She was treated with I&D in the emergency department followed by IV antibiotics therapy as an inpatient.  She was diagnosed with MRSA bacteremia, endocarditis was ruled out with a TEE performed 3/16, but she continued to have positive blood cultures despite treatment with ceftriaxone and vancomycin.  Antibiotic therapy was changed to daptomycin.  She was then transferred to [facility]. ?Extensive imagings have been done here which showed right clavicle osteomyelitis/phlegmon/abscess, widespread cervical, thoracic, lumbar discitis/osteomyelitis/epidural abscess/cord impingement.  MRI also showed bilateral iliopsoas abscess, right complex pleural effusion.  ID, orthopedics, neurosurgery following. ?Underwent lumbar laminectomy/debridement of abscess, L4-L5 on 3/25. Hospital course also remarkable for finding of right-sided empyema, gastric perforation. Status post emergent laparotomy and perforation of repair by general surgery.  PCCM was also following for chest tube on the right side, which was removed on 3/29. ?Underwent aspiration of the psoas abscesses on 3/30.  ?Underwent debridement of the right clavicle and application of wound VAC to the right shoulder area on 3/31. ?Developed severe lower back pain on 4/2.  MRI was done which showed improvement in the abscess.  Symptoms have improved. ? ?New events last 24 hours / Subjective: ?Patient is feeling well today, remains in the hospital for IV antibiotics through 5/26.  Nursing requesting PICC line as patient lost her IV access today.  Patient may not leave AMA with PICC line in  place ? ?Assessment & Plan: ?  ? ?Principal Problem: ?  MRSA bacteremia ?Active Problems: ?  Abscess in epidural space of lumbar spine ?  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  Gastric perforation (HCC) ?  Iliopsoas abscess (HCC) ?  Pleural effusion ?  Normocytic anemia ?  Anxiety with depression ?  Pathologic fracture of right clavicle ?  Acute osteomyelitis of clavicle, right (HCC) ?  Polysubstance abuse (HCC) ?  Transaminitis ?  Pain management ? ? ? ?MRSA bacteremia ?Patient has disseminated MRSA bacteremia.  ?TEE at outside facility on 3/16, negative for endocarditis as per the report  ?TTE did not show any vegetation.  Per ID note dated 4/4 no clear indication for repeat TEE given suspected source control. ?Per ID patient will need a total of 8 weeks of antibiotic therapy starting from date of last surgery on 3/31 with tentative end date 5/26 ?Patient with extremely poor dentition and this could be the source of her bacteremia ?  ?Gastric perforation  ?Status postsurgical repair ?Currently tolerating soft diet ?  ?Abscess in epidural space of cervical spine ?Discitis/osteomyelitis at C2-3, C5-6, and C6-7. Ventral epidural abscess at C2-3, C5, and C6 with cord impingement especially at the lower 2 levels. Facet arthritis on the left at C7-T1 and right at C3-4. ?-Also showed discitis/osteomyelitis of T9- T10, T11-T12 ?-Seen by neurosurgery previously.  No focal neurological deficits noted  ?  ?Abscess in epidural space of lumbar spine/ongoing lumbar back pain/physical deconditioning ?Imaging review lumbar discitis as well as dorsal epidural abscess at L4-L5 which was compressive on the thecal sac S/P  lumbar laminectomy, debridement of abscess, L4-L5 on 3/25.   ?Improved ability to mobilize and participate with therapy with use of corset brace ?Continue pain  management ?  ?Iliopsoas abscess /Sacroiliac septic arthritis/osteomyelitis ?Pelvic MRI showed bilateral infectious sacroiliitis and osteomyelitis  of the sacrum and periarticular right iliac bone. Bilateral psoas abscesses, Small bilateral paraspinal muscle abscesses, intramuscular abscess within the left piriformis muscle, small intramuscular abscess within the left gluteus medius muscle. ?Seen by interventional radiology and underwent aspiration on 3/30.  MRSA noted on cultures.   ?  ?Pleural effusion ?Chest CT showed a large loculated right pleural effusion concerning for empyema, subcentimeter groundglass nodularity within the bilateral lungs concerning for septic emboli.  ?S/P chest tube -  Fluid exudative.  Cultures negative ?Chest x-ray showed resolution of the effusion.  Chest tube was discontinued on 3/29.  Respiratory status is stable. ?  ?Normocytic anemia ?Received 2 units PRBC at outside facility.  ?Hemoglobin stable ?  ?Anxiety with depression ?Continue Prozac and Ativan prn ?  ?Acute osteomyelitis of clavicle, right (HCC) ?MRI of the shoulder showed osteomyelitis, phlegmon, possible abscess.  Orthopedics following.  Underwent debridement of right clavicle and application of wound VAC on 3/31.  ?  ?Polysubstance abuse  ?Patient reports last drug use was 1 year ago. ? ? ? ?DVT prophylaxis:  ?SCDs Start: 09/03/21 1605 ?enoxaparin (LOVENOX) injection 40 mg Start: 08/29/21 1000 ?Place and maintain sequential compression device Start: 08/28/21 1049 ? ?Code Status: Full ?Family Communication: None at bedside ?Disposition Plan:  ?Status is: Inpatient ?Remains inpatient appropriate because: Remains in the hospital for IV antibiotics ? ? ?Antimicrobials:  ?Anti-infectives (From admission, onward)  ? ? Start     Dose/Rate Route Frequency Ordered Stop  ? 09/06/21 2200  vancomycin (VANCOREADY) IVPB 1500 mg/300 mL       ? 1,500 mg ?150 mL/hr over 120 Minutes Intravenous Every 12 hours 09/06/21 1257 10/29/21 2359  ? 09/03/21 1355  vancomycin (VANCOCIN) powder  Status:  Discontinued       ?   As needed 09/03/21 1355 09/03/21 1426  ? 09/01/21 2200  vancomycin  (VANCOREADY) IVPB 1750 mg/350 mL  Status:  Discontinued       ? 1,750 mg ?175 mL/hr over 120 Minutes Intravenous Every 12 hours 09/01/21 1058 09/06/21 1257  ? 08/30/21 1000  metroNIDAZOLE (FLAGYL) IVPB 500 mg  Status:  Discontinued       ? 500 mg ?100 mL/hr over 60 Minutes Intravenous Every 12 hours 08/30/21 0036 08/30/21 1708  ? 08/30/21 0600  metroNIDAZOLE (FLAGYL) IVPB 500 mg  Status:  Discontinued       ? 500 mg ?100 mL/hr over 60 Minutes Intravenous On call to O.R. 08/30/21 0220 08/30/21 0229  ? 08/30/21 0245  ciprofloxacin (CIPRO) IVPB 400 mg  Status:  Discontinued       ? 400 mg ?200 mL/hr over 60 Minutes Intravenous Every 12 hours 08/30/21 0037 08/30/21 1708  ? 08/30/21 0045  cefTRIAXone (ROCEPHIN) 2 g in sodium chloride 0.9 % 100 mL IVPB  Status:  Discontinued       ? 2 g ?200 mL/hr over 30 Minutes Intravenous Every 24 hours 08/30/21 0036 08/30/21 0036  ? 08/29/21 2315  metroNIDAZOLE (FLAGYL) IVPB 500 mg       ?Note to Pharmacy: TO MAIN OR STAT!! In surgery now  ? 500 mg ?100 mL/hr over 60 Minutes Intravenous To Surgery 08/29/21 2302 08/29/21 2319  ? 08/29/21 2200  vancomycin (VANCOREADY) IVPB 1250 mg/250 mL  Status:  Discontinued       ? 1,250 mg ?166.7 mL/hr over 90 Minutes Intravenous Every 12 hours 08/29/21 1857 09/01/21 1058  ? 08/29/21  2100  Vancomycin (VANCOCIN) 1,250 mg in sodium chloride 0.9 % 250 mL IVPB  Status:  Discontinued       ? 1,250 mg ?166.7 mL/hr over 90 Minutes Intravenous Every 12 hours 08/29/21 0837 08/29/21 1858  ? 08/29/21 0930  vancomycin (VANCOCIN) 2,000 mg in sodium chloride 0.9 % 500 mL IVPB       ? 2,000 mg ?260 mL/hr over 120 Minutes Intravenous  Once 08/29/21 0837 08/29/21 1257  ? 08/26/21 2000  cefTRIAXone (ROCEPHIN) 2 g in sodium chloride 0.9 % 100 mL IVPB  Status:  Discontinued       ? 2 g ?200 mL/hr over 30 Minutes Intravenous Every 24 hours 08/26/21 1614 08/28/21 1736  ? 08/26/21 2000  DAPTOmycin (CUBICIN) 700 mg in sodium chloride 0.9 % IVPB  Status:  Discontinued        ? 8 mg/kg ? 87.2 kg ?128 mL/hr over 30 Minutes Intravenous Daily 08/26/21 1819 08/29/21 0842  ? ?  ? ? ? ?Objective: ?Vitals:  ? 09/21/21 0833 09/21/21 1416 09/21/21 2032 09/22/21 0424  ?BP: 93/60 97/62 (!) 98/54 11

## 2021-09-23 LAB — BASIC METABOLIC PANEL
Anion gap: 6 (ref 5–15)
BUN: 9 mg/dL (ref 6–20)
CO2: 25 mmol/L (ref 22–32)
Calcium: 8.5 mg/dL — ABNORMAL LOW (ref 8.9–10.3)
Chloride: 103 mmol/L (ref 98–111)
Creatinine, Ser: 0.38 mg/dL — ABNORMAL LOW (ref 0.44–1.00)
GFR, Estimated: >60 mL/min (ref >60)
Glucose, Bld: 96 mg/dL (ref 70–99)
Potassium: 3.7 mmol/L (ref 3.5–5.1)
Sodium: 134 mmol/L — ABNORMAL LOW (ref 135–145)

## 2021-09-23 NOTE — Progress Notes (Signed)
Occupational Therapy Treatment ?Patient Details ?Name: Kimberly Booker ?MRN: [id number] ?DOB: [birth year] ?Today's Date: 09/23/2021 ? ? ?History of present illness Pt is a 36 y.o. female initially admitted to [facility] 08/15/21 with R shoulder abscess; admit to [facility] 08/26/21 with MRSA bacteremia. 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 S/p L4-5 laminectomy and abscess debridement 3/25. Pt with pleural effusion s/p R pigtail chest tube 3/26. Abdominal CT showed bowel perforation; s/p ex lap with patch repair on 3/27. S/p R psoas abscess drainage on 3/30. S/p R clavicle I&D with wound vac placement 3/31. Pt developed severe LBP on 4/2; MRI showed improvement in abscess. PMH includes anxiety, depression, polysubstance abuse, R clavicle fx ~8 mo prior to admission. ?  ?OT comments ? Pt making progress with functional goals. Pt agreeable to walk to bathroom for toileting and bathing at sink; wanted assist to wash her hair. OT will continue to follow acutely to maximize level of function and safety  ? ?Recommendations for follow up therapy are one component of a multi-disciplinary discharge planning process, led by the attending physician.  Recommendations may be updated based on patient status, additional functional criteria and insurance authorization. ?   ?Follow Up Recommendations ? No OT follow up  ?  ?Assistance Recommended at Discharge Intermittent Supervision/Assistance  ?Patient can return home with the following ? Assist for transportation;Help with stairs or ramp for entrance;A little help with walking and/or transfers;A little help with bathing/dressing/bathroom ?  ?Equipment Recommendations ? Tub/shower seat;BSC/3in1  ?  ?Recommendations for Other Services   ? ?  ?Precautions / Restrictions Precautions ?Precautions: Fall;Back;Other (comment) ?Precaution Comments: R clavicle wound vac; abdominal precautions for comfort, lumbar corset for comfort ?Required Braces or Orthoses: Spinal  Brace ?Spinal Brace: Lumbar corset ?Restrictions ?Weight Bearing Restrictions: No ?RUE Weight Bearing: Weight bearing as tolerated  ? ? ?  ? ?Mobility Bed Mobility ?Overal bed mobility: Modified Independent ?Bed Mobility: Supine to Sit ?Rolling: Modified independent (Device/Increase time) ?Sidelying to sit: Modified independent (Device/Increase time) ?Supine to sit: Modified independent (Device/Increase time) ?  ?  ?General bed mobility comments: reviewed back/body mechanics ?  ? ?Transfers ?Overall transfer level: Needs assistance ?Equipment used: Rolling walker (2 wheels) ?Transfers: Sit to/from Stand ?Sit to Stand: Min guard ?Stand pivot transfers: Min guard ?  ?Step pivot transfers: Min guard ?  ?  ?General transfer comment: pt walked to bathroom ?  ?  ?Balance Overall balance assessment: Needs assistance ?Sitting-balance support: Feet supported ?Sitting balance-Leahy Scale: Good ?  ?  ?Standing balance support: Single extremity supported ?Standing balance-Leahy Scale: Fair ?  ?  ?  ?  ?  ?  ?  ?  ?  ?  ?  ?  ?   ? ?ADL either performed or assessed with clinical judgement  ? ?ADL Overall ADL's : Needs assistance/impaired ?  ?  ?Grooming: Brushing hair;Min guard;Standing;Sitting ?Grooming Details (indicate cue type and reason): assisted pt washing hair at sink ?  ?  ?  ?  ?Upper Body Dressing : Set up;Supervision/safety;Sitting ?Upper Body Dressing Details (indicate cue type and reason): donned clean gown seated in recliner ?Lower Body Dressing: Modified independent;Sitting/lateral leans ?Lower Body Dressing Details (indicate cue type and reason): donned socks ?Toilet Transfer: Supervision/safety;Rolling walker (2 wheels);Regular Toilet;Ambulation ?  ?Toileting- Clothing Manipulation and Hygiene: Supervision/safety;Sit to/from stand ?  ?  ?  ?Functional mobility during ADLs: Rolling walker (2 wheels);Supervision/safety ?General ADL Comments: pt wanted assist to wash hair, agreeable to UB bathing and dressing ?   ? ?  Extremity/Trunk Assessment Upper Extremity Assessment ?Upper Extremity Assessment: Generalized weakness ?  ?Lower Extremity Assessment ?Lower Extremity Assessment: Defer to PT evaluation ?  ?Cervical / Trunk Assessment ?Cervical / Trunk Assessment: Back Surgery;Other exceptions ?Cervical / Trunk Exceptions: abdominal surgery ?  ? ?Vision Patient Visual Report: No change from baseline ?  ?  ?Perception   ?  ?Praxis   ?  ? ?Cognition Arousal/Alertness: Awake/alert ?Behavior During Therapy: [facility] for tasks assessed/performed ?Overall Cognitive Status: Within Functional Limits for tasks assessed ?  ?  ?  ?  ?  ?  ?  ?  ?  ?  ?  ?  ?  ?  ?  ?  ?  ?  ?  ?   ?Exercises   ? ?  ?Shoulder Instructions   ? ? ?  ?General Comments    ? ? ?Pertinent Vitals/ Pain       Pain Assessment ?Pain Assessment: Faces ?Faces Pain Scale: Hurts a little bit ?Pain Location: back ? ?Home Living   ?  ?  ?  ?  ?  ?  ?  ?  ?  ?  ?  ?  ?  ?  ?  ?  ?  ?  ? ?  ?Prior Functioning/Environment    ?  ?  ?  ?   ? ?Frequency ? Min 2X/week  ? ? ? ? ?  ?Progress Toward Goals ? ?OT Goals(current goals can now be found in the care plan section) ? Progress towards OT goals: Progressing toward goals ? ?   ?Plan Frequency remains appropriate;Discharge plan remains appropriate   ? ?Co-evaluation ? ? ?   ?  ?  ?  ?  ? ?  ?AM-PAC OT "6 Clicks" Daily Activity     ?Outcome Measure ? ? Help from another person eating meals?: None ?Help from another person taking care of personal grooming?: A Little ?Help from another person toileting, which includes using toliet, bedpan, or urinal?: None ?Help from another person bathing (including washing, rinsing, drying)?: A Little ?Help from another person to put on and taking off regular upper body clothing?: A Little ?Help from another person to put on and taking off regular lower body clothing?: A Little ?6 Click Score: 20 ? ?  ?End of Session Equipment Utilized During Treatment: Gait belt;Rolling walker (2 wheels) ? ?OT Visit  Diagnosis: Unsteadiness on feet (R26.81);Other abnormalities of gait and mobility (R26.89);Muscle weakness (generalized) (M62.81);Pain ?Pain - part of body:  (back) ?  ?Activity Tolerance Patient tolerated treatment well ?  ?Patient Left with call bell/phone within reach;in chair ?  ?Nurse Communication   ?  ? ?   ? ?Time: 7672-0947 ?OT Time Calculation (min): 24 min ? ?Charges: OT General Charges ?$OT Visit: 1 Visit ?OT Treatments ?$Self Care/Home Management : 8-22 mins ?$Therapeutic Activity: 8-22 mins ? ? ? ?Galen Manila ?09/23/2021, 1:22 PM ?

## 2021-09-23 NOTE — Progress Notes (Signed)
Physical Therapy Treatment ?Patient Details ?Name: Kimberly Booker ?MRN: [id number] ?DOB: 1985/12/15 ?Today's Date: 09/23/2021 ? ? ?History of Present Illness Pt is a 36 y.o. female initially admitted to [facility] 08/15/21 with R shoulder abscess; admit to [facility] 08/26/21 with MRSA bacteremia. 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 S/p L4-5 laminectomy and abscess debridement 3/25. Pt with pleural effusion s/p R pigtail chest tube 3/26. Abdominal CT showed bowel perforation; s/p ex lap with patch repair on 3/27. S/p R psoas abscess drainage on 3/30. S/p R clavicle I&D with wound vac placement 3/31. Pt developed severe LBP on 4/2; MRI showed improvement in abscess. PMH includes anxiety, depression, polysubstance abuse, R clavicle fx ~8 mo prior to admission. ? ?  ?PT Comments  ? ? Pt received supine and agreeable to session with focus on increasing functional mobility endurance. Pt with decreased pain this session and great motivation. Pt mod I for bed mobility and donning back brace for comfort and min a with hand hold assist x1 to stand pivot to WC. Pt transported outside in wheelchair. Pt min guard down to supervision to ambulate with rollator an increased distance without need for sitting recovery break this session. Pt continues to benefit from skilled PT services to progress toward functional mobility goals.   ?  ?Recommendations for follow up therapy are one component of a multi-disciplinary discharge planning process, led by the attending physician.  Recommendations may be updated based on patient status, additional functional criteria and insurance authorization. ? ?Follow Up Recommendations ? Home health PT ?  ?  ?Assistance Recommended at Discharge Intermittent Supervision/Assistance  ?Patient can return home with the following A little help with walking and/or transfers;Assistance with cooking/housework;Assist for transportation ?  ?Equipment Recommendations ? Rollator (4 wheels)  ?   ?Recommendations for Other Services   ? ? ?  ?Precautions / Restrictions Precautions ?Precautions: Fall;Back;Other (comment) ?Precaution Comments: R clavicle wound vac; abdominal precautions for comfort, lumbar corset for comfort ?Required Braces or Orthoses: Spinal Brace ?Spinal Brace: Lumbar corset ?Restrictions ?Weight Bearing Restrictions: No ?RUE Weight Bearing: Weight bearing as tolerated  ?  ? ?Mobility ? Bed Mobility ?Overal bed mobility: Modified Independent ?Bed Mobility: Supine to Sit ?  ?  ?Supine to sit: Modified independent (Device/Increase time) ?Sit to supine: Modified independent (Device/Increase time) ?  ?General bed mobility comments: reviewed back/body mechanics ?  ? ?Transfers ?Overall transfer level: Needs assistance ?Equipment used: None ?Transfers: Sit to/from Stand, Bed to chair/wheelchair/BSC ?Sit to Stand: Supervision, Min guard ?Stand pivot transfers: Min assist ?  ?  ?  ?  ?General transfer comment: HHA tp stand pivot to WC ?  ? ?Ambulation/Gait ?Ambulation/Gait assistance: Min guard ?Gait Distance (Feet): 370 Feet (330+50) ?Assistive device: Rollator (4 wheels) ?Gait Pattern/deviations: Step-through pattern, Decreased stride length, Antalgic, Trunk flexed ?Gait velocity: Decreased ?  ?  ?General Gait Details: increased endurance, slow and guarded d/t back pain ? ? ?Stairs ?  ?  ?  ?  ?  ? ? ?Wheelchair Mobility ?  ? ?Modified Rankin (Stroke Patients Only) ?  ? ? ?  ?Balance Overall balance assessment: Needs assistance ?Sitting-balance support: Feet supported ?Sitting balance-Leahy Scale: Good ?Sitting balance - Comments: indep with pericare sitting on toilet ?  ?Standing balance support: Single extremity supported ?Standing balance-Leahy Scale: Fair ?Standing balance comment: pt stating she still is relying on UE strength a good amound on RW/Rollator during mobility ?  ?  ?  ?  ?  ?  ?  ?  ?  ?  ?  ?  ? ?  ?  Cognition Arousal/Alertness: Awake/alert ?Behavior During Therapy: [facility] for  tasks assessed/performed ?Overall Cognitive Status: Within Functional Limits for tasks assessed ?  ?  ?  ?  ?  ?  ?  ?  ?  ?  ?  ?  ?  ?  ?  ?  ?  ?  ?  ? ?  ?Exercises   ? ?  ?General Comments   ?  ?  ? ?Pertinent Vitals/Pain Pain Assessment ?Pain Assessment: Faces ?Faces Pain Scale: Hurts a little bit ?Pain Location: back ?Pain Descriptors / Indicators: Guarding, Aching ?Pain Intervention(s): Limited activity within patient's tolerance, Monitored during session  ? ? ?Home Living   ?  ?  ?  ?  ?  ?  ?  ?  ?  ?   ?  ?Prior Function    ?  ?  ?   ? ?PT Goals (current goals can now be found in the care plan section) Acute Rehab PT Goals ?PT Goal Formulation: With patient ?Time For Goal Achievement: 09/14/21 ? ?  ?Frequency ? ? ? Min 5X/week ? ? ? ?  ?PT Plan Current plan remains appropriate  ? ? ?Co-evaluation   ?  ?  ?  ?  ? ?  ?AM-PAC PT "6 Clicks" Mobility   ?Outcome Measure ? Help needed turning from your back to your side while in a flat bed without using bedrails?: None ?Help needed moving from lying on your back to sitting on the side of a flat bed without using bedrails?: None ?Help needed moving to and from a bed to a chair (including a wheelchair)?: None ?Help needed standing up from a chair using your arms (e.g., wheelchair or bedside chair)?: A Little ?Help needed to walk in hospital room?: A Little ?Help needed climbing 3-5 steps with a railing? : A Lot ?6 Click Score: 20 ? ?  ?End of Session Equipment Utilized During Treatment: Back brace ?Activity Tolerance: Patient tolerated treatment well ?Patient left: in bed;with call bell/phone within reach;with bed alarm set ?Nurse Communication: Mobility status ?PT Visit Diagnosis: Other abnormalities of gait and mobility (R26.89);Muscle weakness (generalized) (M62.81);Pain ?Pain - Right/Left: Right ?Pain - part of body: Shoulder ?  ? ? ?Time: 8127-5170 ?PT Time Calculation (min) (ACUTE ONLY): 52 min ? ?Charges:  $Gait Training: 23-37 mins ?$Therapeutic Activity:  8-22 mins          ?          ? ?Lenora Boys. PTA ?Acute Rehabilitation Services ?Office: [phone] ? ? ? ?Marlana Salvage Willliam Pettet ?09/23/2021, 3:54 PM ? ?

## 2021-09-23 NOTE — Progress Notes (Signed)
? ? ? [facility] ?                                                                            ? ? ?Kimberly Booker, is a 36 y.o. female, DOB - 23-Sep-1985, ZOX:[id number] ?Admit date - 08/26/2021    ?Outpatient Primary MD for the patient is Pcp, No ? ?LOS - 28  days ? ? ? ?Brief summary  ? ?36 year old with past medical history significant for anxiety, depression, suicidal attempt drug overdose, polysubstance abuse, right clavicle fracture, was [facility]/2021 due to right shoulder abscess.She was treated with I&D in the emergency department followed by IV antibiotics therapy as an inpatient.  She was diagnosed with MRSA bacteremia, endocarditis was ruled out with a TEE performed 3/16, but she continued to have positive blood cultures despite treatment with ceftriaxone and vancomycin.  Antibiotic therapy was changed to daptomycin.  [facility]. ?Extensive imagings have been done here which showed right clavicle osteomyelitis/phlegmon/abscess, widespread cervical, thoracic, lumbar discitis/osteomyelitis/epidural abscess/cord impingement.  MRI also showed bilateral iliopsoas abscess, right complex pleural effusion.  ID, orthopedics, neurosurgery following. ?Underwent lumbar laminectomy/debridement of abscess, L4-L5 on 3/25. Hospital course also remarkable for finding of right-sided empyema, gastric perforation. Status post emergent laparotomy and perforation of repair by general surgery.  PCCM was also following for chest tube on the right side, which was removed on 3/29. ?Underwent aspiration of the psoas abscesses on 3/30.  ?Underwent debridement of the right clavicle and application of wound VAC to the right shoulder area on 3/31. ?Developed severe lower back pain on 4/2.  MRI was done which showed improvement in the abscess.  Symptoms have improved. ? ? ?Assessment & Plan  ? ? ?Assessment and Plan: ?* MRSA bacteremia ?Patient has disseminated MRSA  bacteremia.  ?She had TEE outside facility on 3/16, negative for endocarditis as per the report  ?TTE did not show any vegetation, might need TEE here but was delayed due emergent surgery for gastric perforation and other surgical procedures as outlined.   ?Per infectious disease patient will need a total of 8 weeks of antibiotic therapy starting from the day of surgery on 09/03/2021 with an end date on 10/29/2021. ?Patient's blood cultures have been negative so far. ? ?Gastric perforation (HCC) ?Complained of abdominal pain on 3/26.  Chest x-ray done after chest tube placement showed incidental finding of pneumoperitoneum.  CT abdomen/pelvis confirmed pneumoperitoneum.  General surgery consulted and she underwent emergent exploratory laparatomy with finding of gastric perforation.  ?S/p surgical repair and patient is currently on soft diet and able to tolerate. ? ? ?Abscess in epidural space of cervical spine ?Discitis/osteomyelitis at C2-3, C5-6, and C6-7. Ventral epidural abscess at C2-3, C5, and C6 with cord impingement especially at the lower 2 levels. Facet arthritis on the left at C7-T1 and right at C3-4. ?-Also showed discitis/osteomyelitis of T9- T10, T11-T12 ?-Was seen by neurosurgery, no focal deficits at this time. ?Continue with pain management. ?Patient able to ambulate and participate in therapy at this time. ? 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 Bilateral facet arthritis at L5-S1. Dorsal epidural abscess  at L4 and L5,highly compressive on the thecal sac. ?S/P  lumbar laminectomy, debridement of abscess, L4-L5 on 3/25. ?No further interventions planned by neurosurgery.   ?On 4/2 patient was complaining of severe pain in the lower back radiating down to her legs.  MRI lumbar spine was ordered. ?It redemonstrated the osteomyelitis at T11-12 and L5-S1.  Noted to be status post L4-L5 laminectomy and debridement with  significant decrease in the amount of the abscess.  A small collection was noted posterior to the S1 causing moderate thecal sac narrowing.  Other findings of iliopsoas abscess and sacroiliac septic arthritis were noted as seen previously on MRI pelvis. ?These findings were discussed with neurosurgery.  Patient does not have any focal neurological deficits.  No intervention was recommended.  Patient able to ambulate and no focal deficits seen. ?Continue with therapy and encourage ambulation as tolerated. ? ?Iliopsoas abscess (HCC) ?Sacroiliac septic arthritis/osteomyelitis ? ?Pelvic MRI showed bilateral infectious sacroiliitis and osteomyelitis of the sacrum and periarticular right iliac bone. Bilateral psoas abscesses, Small bilateral paraspinal muscle abscesses, intramuscular abscess within the left piriformis muscle, small intramuscular abscess within the left gluteus medius muscle. ? ? ?Pleural effusion ?Chest CT showed a large loculated right pleural effusion concerning for empyema, subcentimeter groundglass nodularity within the bilateral lungs concerning for septic emboli.  ?S/P chest tube placement.  Fluid noted to be exudative.  Cultures without any growth.  No need for DNase/tPA.   ?Chest x-ray showed resolution of the effusion.  Chest tube was discontinued on 3/29.  Respiratory status is stable.  Patient is currently on room air ? ?Normocytic anemia ?S/p 2 units of PRBC transfusion at an outside facility.  Last hemoglobin on 09/15/2021 stable around 8.5 ? ?Anxiety with depression ?Noted to be on Prozac and Ativan as needed. ? ?Acute osteomyelitis of clavicle, right (HCC) ?MRI of the shoulder showed osteomyelitis, phlegmon, possible abscess.  Underwent debridement of right clavicle and application of wound VAC on 3/31.  ?Pain control and therapy evaluation ? ? ?Polysubstance abuse (HCC) ?She declined IV drug abuse recently, last drug use was 2 years ago as per patient. ? ?Pain management ?Patient with MRSA  infection at multiple sites.  Subsequently with gastric perforation.  Pain management has been difficult. ? ?Transaminitis ?HCV antibody positive ?HCV quantitative was not detected. ? ?LFTs noted to be normal on 4/4 ? ?  ? ?Malnutrition Type: ? ?Nutrition Problem: Increased nutrient needs ?Etiology: acute illness, post-op healing, wound healing ? ? ?Malnutrition Characteristics: ? ?Signs/Symptoms: estimated needs ? ? ?Nutrition Interventions: ? ?Interventions: Ensure Enlive (each supplement provides 350kcal and 20 grams of protein), Prostat, Juven, MVI ? ?Estimated body mass index is 26.63 kg/m? as calculated from the following: ?  Height as of this encounter: 5\' 7"  (1.702 m). ?  Weight as of this encounter: 77.1 kg. ? ?Code Status: full code.  ?DVT Prophylaxis:  SCDs Start: 09/03/21 1605 ?enoxaparin (LOVENOX) injection 40 mg Start: 08/29/21 1000 ?Place and maintain sequential compression device Start: 08/28/21 1049 ? ? ?Level of Care: Level of care: Med-Surg ?Family Communication: none at bedside.  ? ?Disposition Plan:     Remains inpatient appropriate:  IV ANTIBIOTICS ? ?Procedures:  ? ? ?Consultants:   ?ID ? ?Antimicrobials:  ? ?Anti-infectives (From admission, onward)  ? ? Start     Dose/Rate Route Frequency Ordered Stop  ? 09/06/21 2200  vancomycin (VANCOREADY) IVPB 1500 mg/300 mL       ? 1,500 mg ?150 mL/hr over 120 Minutes Intravenous Every 12 hours  09/06/21 1257 10/29/21 2359  ? 09/03/21 1355  vancomycin (VANCOCIN) powder  Status:  Discontinued       ?   As needed 09/03/21 1355 09/03/21 1426  ? 09/01/21 2200  vancomycin (VANCOREADY) IVPB 1750 mg/350 mL  Status:  Discontinued       ? 1,750 mg ?175 mL/hr over 120 Minutes Intravenous Every 12 hours 09/01/21 1058 09/06/21 1257  ? 08/30/21 1000  metroNIDAZOLE (FLAGYL) IVPB 500 mg  Status:  Discontinued       ? 500 mg ?100 mL/hr over 60 Minutes Intravenous Every 12 hours 08/30/21 0036 08/30/21 1708  ? 08/30/21 0600  metroNIDAZOLE (FLAGYL) IVPB 500 mg  Status:   Discontinued       ? 500 mg ?100 mL/hr over 60 Minutes Intravenous On call to O.R. 08/30/21 0220 08/30/21 0229  ? 08/30/21 0245  ciprofloxacin (CIPRO) IVPB 400 mg  Status:  Discontinued       ? 400 mg ?200 mL/

## 2021-09-23 NOTE — Plan of Care (Signed)

## 2021-09-24 DIAGNOSIS — F418 Other specified anxiety disorders: Secondary | ICD-10-CM | POA: Diagnosis not present

## 2021-09-24 DIAGNOSIS — M86111 Other acute osteomyelitis, right shoulder: Secondary | ICD-10-CM | POA: Diagnosis not present

## 2021-09-24 DIAGNOSIS — G061 Intraspinal abscess and granuloma: Secondary | ICD-10-CM | POA: Diagnosis not present

## 2021-09-24 DIAGNOSIS — R7881 Bacteremia: Secondary | ICD-10-CM | POA: Diagnosis not present

## 2021-09-24 MED ORDER — CYCLOBENZAPRINE HCL 5 MG PO TABS
5.0000 mg | ORAL_TABLET | Freq: Two times a day (BID) | ORAL | Status: DC | PRN
  Administered 2021-09-24 – 2021-10-22 (×16): 5 mg via ORAL
  Filled 2021-09-24 (×16): qty 1

## 2021-09-24 MED ORDER — FLUCONAZOLE 200 MG PO TABS
200.0000 mg | ORAL_TABLET | Freq: Once | ORAL | Status: AC
  Administered 2021-09-24: 200 mg via ORAL
  Filled 2021-09-24: qty 1

## 2021-09-24 NOTE — Progress Notes (Signed)
? ? ? [facility] ?                                                                            ? ? ?Kimberly Booker, is a 36 y.o. female, DOB - Oct 01, 1985, YHC:[id number] ?Admit date - 08/26/2021    ?Outpatient Primary MD for the patient is Pcp, No ? ?LOS - 29  days ? ? ? ?Brief summary  ? ?36 year old with past medical history significant for anxiety, depression, suicidal attempt drug overdose, polysubstance abuse, right clavicle fracture, was admitted at [facility] on 08/15/2021 due to right shoulder abscess.She was treated with I&D in the emergency department followed by IV antibiotics therapy as an inpatient.  She was diagnosed with MRSA bacteremia, endocarditis was ruled out with a TEE performed 3/16, but she continued to have positive blood cultures despite treatment with ceftriaxone and vancomycin.  Antibiotic therapy was changed to daptomycin.  She was then transferred to [facility]. ?Extensive imagings have been done here which showed right clavicle osteomyelitis/phlegmon/abscess, widespread cervical, thoracic, lumbar discitis/osteomyelitis/epidural abscess/cord impingement.  MRI also showed bilateral iliopsoas abscess, right complex pleural effusion.  ID, orthopedics, neurosurgery following. ?Underwent lumbar laminectomy/debridement of abscess, L4-L5 on 3/25. Hospital course also remarkable for finding of right-sided empyema, gastric perforation. Status post emergent laparotomy and perforation of repair by general surgery.  PCCM was also following for chest tube on the right side, which was removed on 3/29. ?Underwent aspiration of the psoas abscesses on 3/30.  ?Underwent debridement of the right clavicle and application of wound VAC to the right shoulder area on 3/31. ?Developed severe lower back pain on 4/2.  MRI was done which showed improvement in the abscess.  Symptoms have improved. ? ? ?Assessment & Plan  ? ? ?Assessment and Plan: ?* MRSA bacteremia ?Patient has disseminated MRSA  bacteremia.  ?She had TEE outside facility on 3/16, negative for endocarditis as per the report  ?TTE did not show any vegetation, might need TEE here but was delayed due emergent surgery for gastric perforation and other surgical procedures as outlined.   ?Per infectious disease patient will need a total of 8 weeks of antibiotic therapy starting from the day of surgery on 09/03/2021 with an end date on 10/29/2021. ?Patient's blood cultures have been negative so far. ? ?Gastric perforation (HCC) ?Complained of abdominal pain on 3/26.  Chest x-ray done after chest tube placement showed incidental finding of pneumoperitoneum.  CT abdomen/pelvis confirmed pneumoperitoneum.  General surgery consulted and she underwent emergent exploratory laparatomy with finding of gastric perforation.  ?S/p surgical repair and patient is currently on soft diet and able to tolerate. ? ? ?Abscess in epidural space of cervical spine ?Discitis/osteomyelitis at C2-3, C5-6, and C6-7. Ventral epidural abscess at C2-3, C5, and C6 with cord impingement especially at the lower 2 levels. Facet arthritis on the left at C7-T1 and right at C3-4. ?-Also showed discitis/osteomyelitis of T9- T10, T11-T12 ?-Was seen by neurosurgery, no focal deficits at this time. ?Continue with pain management. ?Patient able to ambulate and participate in therapy at this time. ? 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 Bilateral facet arthritis at L5-S1. Dorsal epidural abscess  at L4 and L5,highly compressive on the thecal sac. ?S/P  lumbar laminectomy, debridement of abscess, L4-L5 on 3/25. ?No further interventions planned by neurosurgery.   ?On 4/2 patient was complaining of severe pain in the lower back radiating down to her legs.  MRI lumbar spine was ordered. ?It redemonstrated the osteomyelitis at T11-12 and L5-S1.  Noted to be status post L4-L5 laminectomy and debridement with  significant decrease in the amount of the abscess.  A small collection was noted posterior to the S1 causing moderate thecal sac narrowing.  Other findings of iliopsoas abscess and sacroiliac septic arthritis were noted as seen previously on MRI pelvis. ?These findings were discussed with neurosurgery.  Patient does not have any focal neurological deficits.  No intervention was recommended.  Patient able to ambulate and no focal deficits seen. ?Continue with therapy and encourage ambulation as tolerated. ? ?Iliopsoas abscess (HCC) ?Sacroiliac septic arthritis/osteomyelitis ? ?Pelvic MRI showed bilateral infectious sacroiliitis and osteomyelitis of the sacrum and periarticular right iliac bone. Bilateral psoas abscesses, Small bilateral paraspinal muscle abscesses, intramuscular abscess within the left piriformis muscle, small intramuscular abscess within the left gluteus medius muscle. ? ? ?Pleural effusion ?Chest CT showed a large loculated right pleural effusion concerning for empyema, subcentimeter groundglass nodularity within the bilateral lungs concerning for septic emboli.  ?S/P chest tube placement.  Fluid noted to be exudative.  Cultures without any growth.  No need for DNase/tPA.   ?Chest x-ray showed resolution of the effusion.  Chest tube was discontinued on 3/29.  Respiratory status is stable.  Patient is currently on room air ? ?Normocytic anemia ?S/p 2 units of PRBC transfusion at an outside facility.  Last hemoglobin on 09/15/2021 stable around 8.5 ? ?Anxiety with depression ?Noted to be on Prozac and Ativan as needed. ? ?Acute osteomyelitis of clavicle, right (HCC) ?MRI of the shoulder showed osteomyelitis, phlegmon, possible abscess.  Underwent debridement of right clavicle and application of wound VAC on 3/31.  ?Pain control and therapy evaluation ? ? ?Polysubstance abuse (HCC) ?She declined IV drug abuse recently, last drug use was 2 years ago as per patient. ? ?Pain management ?Patient with MRSA  infection at multiple sites.  Subsequently with gastric perforation.  Pain management has been difficult. ? ?Transaminitis ?HCV antibody positive ?HCV quantitative was not detected. ? ?LFTs noted to be normal on 4/4 ? ? Vaginal discharge:  ?Vaginal itching. One dose of diflucan ordered.  ? ?Malnutrition Type: ? ?Nutrition Problem: Increased nutrient needs ?Etiology: acute illness, post-op healing, wound healing ? ? ?Malnutrition Characteristics: ? ?Signs/Symptoms: estimated needs ? ? ?Nutrition Interventions: ? ?Interventions: Ensure Enlive (each supplement provides 350kcal and 20 grams of protein), Prostat, Juven, MVI ? ?Estimated body mass index is 26.63 kg/m? as calculated from the following: ?  Height as of this encounter: 5\' 7"  (1.702 m). ?  Weight as of this encounter: 77.1 kg. ? ?Code Status: full code.  ?DVT Prophylaxis:  SCDs Start: 09/03/21 1605 ?enoxaparin (LOVENOX) injection 40 mg Start: 08/29/21 1000 ?Place and maintain sequential compression device Start: 08/28/21 1049 ? ? ?Level of Care: Level of care: Med-Surg ?Family Communication: none at bedside.  ? ?Disposition Plan:     Remains inpatient appropriate:  IV ANTIBIOTICS ? ?Procedures:  ? ? ?Consultants:   ?ID ? ?Antimicrobials:  ? ?Anti-infectives (From admission, onward)  ? ? Start     Dose/Rate Route Frequency Ordered Stop  ? 09/24/21 1145  fluconazole (DIFLUCAN) tablet 200 mg       ? 200 mg  Oral  Once 09/24/21 1059 09/24/21 1410  ? 09/06/21 2200  vancomycin (VANCOREADY) IVPB 1500 mg/300 mL       ? 1,500 mg ?150 mL/hr over 120 Minutes Intravenous Every 12 hours 09/06/21 1257 10/29/21 2359  ? 09/03/21 1355  vancomycin (VANCOCIN) powder  Status:  Discontinued       ?   As needed 09/03/21 1355 09/03/21 1426  ? 09/01/21 2200  vancomycin (VANCOREADY) IVPB 1750 mg/350 mL  Status:  Discontinued       ? 1,750 mg ?175 mL/hr over 120 Minutes Intravenous Every 12 hours 09/01/21 1058 09/06/21 1257  ? 08/30/21 1000  metroNIDAZOLE (FLAGYL) IVPB 500 mg  Status:   Discontinued       ? 500 mg ?100 mL/hr over 60 Minutes Intravenous Every 12 hours 08/30/21 0036 08/30/21 1708  ? 08/30/21 0600  metroNIDAZOLE (FLAGYL) IVPB 500 mg  Status:  Discontinued       ? 500 mg ?100

## 2021-09-24 NOTE — Plan of Care (Signed)

## 2021-09-25 DIAGNOSIS — F418 Other specified anxiety disorders: Secondary | ICD-10-CM | POA: Diagnosis not present

## 2021-09-25 DIAGNOSIS — R7881 Bacteremia: Secondary | ICD-10-CM | POA: Diagnosis not present

## 2021-09-25 DIAGNOSIS — M86111 Other acute osteomyelitis, right shoulder: Secondary | ICD-10-CM | POA: Diagnosis not present

## 2021-09-25 DIAGNOSIS — G061 Intraspinal abscess and granuloma: Secondary | ICD-10-CM | POA: Diagnosis not present

## 2021-09-25 LAB — CBC WITH DIFFERENTIAL/PLATELET
Abs Immature Granulocytes: 0.03 10*3/uL (ref 0.00–0.07)
Basophils Absolute: 0.1 10*3/uL (ref 0.0–0.1)
Basophils Relative: 1 %
Eosinophils Absolute: 0.4 10*3/uL (ref 0.0–0.5)
Eosinophils Relative: 6 %
HCT: 23.3 % — ABNORMAL LOW (ref 36.0–46.0)
Hemoglobin: 7.4 g/dL — ABNORMAL LOW (ref 12.0–15.0)
Immature Granulocytes: 0 %
Lymphocytes Relative: 38 %
Lymphs Abs: 2.6 10*3/uL (ref 0.7–4.0)
MCH: 28.8 pg (ref 26.0–34.0)
MCHC: 31.8 g/dL (ref 30.0–36.0)
MCV: 90.7 fL (ref 80.0–100.0)
Monocytes Absolute: 0.5 10*3/uL (ref 0.1–1.0)
Monocytes Relative: 7 %
Neutro Abs: 3.2 10*3/uL (ref 1.7–7.7)
Neutrophils Relative %: 48 %
Platelets: 380 10*3/uL (ref 150–400)
RBC: 2.57 MIL/uL — ABNORMAL LOW (ref 3.87–5.11)
RDW: 13.6 % (ref 11.5–15.5)
WBC: 6.9 10*3/uL (ref 4.0–10.5)
nRBC: 0 % (ref 0.0–0.2)

## 2021-09-25 MED ORDER — LACTATED RINGERS IV BOLUS
1000.0000 mL | Freq: Once | INTRAVENOUS | Status: AC
  Administered 2021-09-25: 1000 mL via INTRAVENOUS

## 2021-09-25 NOTE — Plan of Care (Signed)
?  Problem: Education: ?Goal: Knowledge of General Education information will improve ?Description: Including pain rating scale, medication(s)/side effects and non-pharmacologic comfort measures ?Outcome: Progressing ?  ?Problem: Activity: ?Goal: Risk for activity intolerance will decrease ?Outcome: Progressing ?  ?Problem: Nutrition: ?Goal: Adequate nutrition will be maintained ?Outcome: Progressing ?  ?Problem: Elimination: ?Goal: Will not experience complications related to bowel motility ?Outcome: Progressing ?  ?Problem: Pain Managment: ?Goal: General experience of comfort will improve ?Outcome: Progressing ?  ?Problem: Skin Integrity: ?Goal: Risk for impaired skin integrity will decrease ?Outcome: Progressing ?  ?Problem: Safety: ?Goal: Ability to remain free from injury will improve ?Outcome: Progressing ?  ?

## 2021-09-25 NOTE — Progress Notes (Signed)
? ? ? [facility] ?                                                                            ? ? ?Kimberly Booker, is a 36 y.o. female, DOB - [birth year], FIE:[id number] ?Admit date - 08/26/2021    ?Outpatient Primary MD for the patient is Pcp, No ? ?LOS - 30  days ? ? ? ?Brief summary  ? ?36 year old with past medical history significant for anxiety, depression, suicidal attempt drug overdose, polysubstance abuse, right clavicle fracture, was admitted at [facility] on 08/15/2021 due to right shoulder abscess.She was treated with I&D in the emergency department followed by IV antibiotics therapy as an inpatient.  She was diagnosed with MRSA bacteremia, endocarditis was ruled out with a TEE performed 3/16, but she continued to have positive blood cultures despite treatment with ceftriaxone and vancomycin.  Antibiotic therapy was changed to daptomycin.  She was then transferred to [facility]. ?Extensive imagings have been done here which showed right clavicle osteomyelitis/phlegmon/abscess, widespread cervical, thoracic, lumbar discitis/osteomyelitis/epidural abscess/cord impingement.  MRI also showed bilateral iliopsoas abscess, right complex pleural effusion.  ID, orthopedics, neurosurgery following. ?Underwent lumbar laminectomy/debridement of abscess, L4-L5 on 3/25. Hospital course also remarkable for finding of right-sided empyema, gastric perforation. Status post emergent laparotomy and perforation of repair by general surgery.  PCCM was also following for chest tube on the right side, which was removed on 3/29. ?Underwent aspiration of the psoas abscesses on 3/30.  ?Underwent debridement of the right clavicle and application of wound VAC to the right shoulder area on 3/31. ?Developed severe lower back pain on 4/2.  MRI was done which showed improvement in the abscess.  Symptoms have improved. ?Earlier this am pts BP around 88/65 mmhg. Fluid bolus given. Asymptomatic.  ? ?Assessment & Plan   ? ? ?Assessment and Plan: ?* MRSA bacteremia ?Patient has disseminated MRSA bacteremia.  ?She had TEE outside facility on 3/16, negative for endocarditis as per the report  ?TTE did not show any vegetation, might need TEE here but was delayed due emergent surgery for gastric perforation and other surgical procedures as outlined.   ?Per infectious disease patient will need a total of 8 weeks of antibiotic therapy starting from the day of surgery on 09/03/2021 with an end date on 10/29/2021. ?Patient's blood cultures have been negative so far. ? ?Gastric perforation (HCC) ?Complained of abdominal pain on 3/26.  Chest x-ray done after chest tube placement showed incidental finding of pneumoperitoneum.  CT abdomen/pelvis confirmed pneumoperitoneum.  General surgery consulted and she underwent emergent exploratory laparatomy with finding of gastric perforation.  ?S/p surgical repair and patient is currently on soft diet and able to tolerate. ? ? ?Abscess in epidural space of cervical spine ?Discitis/osteomyelitis at C2-3, C5-6, and C6-7. Ventral epidural abscess at C2-3, C5, and C6 with cord impingement especially at the lower 2 levels. Facet arthritis on the left at C7-T1 and right at C3-4. ?-Also showed discitis/osteomyelitis of T9- T10, T11-T12 ?-Was seen by neurosurgery, no focal deficits at this time. ?Continue with pain management. ?Patient able to ambulate and participate in therapy at this time. ? ?Abscess in epidural space of lumbar spine ?L5-S1 discitis/osteomyelitis and  probable bilateral sacroiliacseptic arthritis. Bilateral facet arthritis at L5-S1. Dorsal epidural abscess at L4 and L5,highly compressive on the thecal sac. ?S/P  lumbar laminectomy, debridement of abscess, L4-L5 on 3/25. ?No further interventions planned by neurosurgery.   ?On 4/2 patient was complaining of severe pain in the lower back radiating down to her legs.  MRI lumbar spine was ordered. ?It redemonstrated the osteomyelitis at T11-12  and L5-S1.  Noted to be status post L4-L5 laminectomy and debridement with significant decrease in the amount of the abscess.  A small collection was noted posterior to the S1 causing moderate thecal sac narrowing.  Other findings of iliopsoas abscess and sacroiliac septic arthritis were noted as seen previously on MRI pelvis. ?These findings were discussed with neurosurgery.  Patient does not have any focal neurological deficits.  No intervention was recommended.  Patient able to ambulate and no focal deficits seen. ?Continue with therapy and encourage ambulation as tolerated. ? ?Iliopsoas abscess (HCC) ?Sacroiliac septic arthritis/osteomyelitis ? ?Pelvic MRI showed bilateral infectious sacroiliitis and osteomyelitis of the sacrum and periarticular right iliac bone. Bilateral psoas abscesses, Small bilateral paraspinal muscle abscesses, intramuscular abscess within the left piriformis muscle, small intramuscular abscess within the left gluteus medius muscle. ? ? ?Pleural effusion ?Chest CT showed a large loculated right pleural effusion concerning for empyema, subcentimeter groundglass nodularity within the bilateral lungs concerning for septic emboli.  ?S/P chest tube placement.  Fluid noted to be exudative.  Cultures without any growth.  No need for DNase/tPA.   ?Chest x-ray showed resolution of the effusion.  Chest tube was discontinued on 3/29.  Respiratory status is stable.  Patient is currently on room air ? ?Normocytic anemia ?S/p 2 units of PRBC transfusion at an outside facility.  Last hemoglobin on 09/15/2021 around 8.5, dropped gradually to 7.4. will transfuse if less than 7. Recheck cbc in am.  ? ? ?Anxiety with depression ?Noted to be on Prozac and Ativan as needed. ? ?Acute osteomyelitis of clavicle, right (HCC) ?MRI of the shoulder showed osteomyelitis, phlegmon, possible abscess.  Underwent debridement of right clavicle and application of wound VAC on 3/31.  ?Pain control and therapy  evaluation ? ? ?Polysubstance abuse (HCC) ?She declined IV drug abuse recently, last drug use was 2 years ago as per patient. ? ?Pain management ?Patient with MRSA infection at multiple sites.  Subsequently with gastric perforation.  Pain management has been difficult. ? ?Transaminitis ?HCV antibody positive ?HCV quantitative was not detected. ? ?LFTs noted to be normal on 4/4 ? ? Vaginal discharge:  ?Vaginal itching. One dose of diflucan ordered.  ? ?Hypotension;  ?Pt asymptomatic.  ?Fluid bolus LR ordered.  ?Recheck BP pending.  ? ? ?Malnutrition Type: ? ?Nutrition Problem: Increased nutrient needs ?Etiology: acute illness, post-op healing, wound healing ? ? ?Malnutrition Characteristics: ? ?Signs/Symptoms: estimated needs ? ? ?Nutrition Interventions: ? ?Interventions: Ensure Enlive (each supplement provides 350kcal and 20 grams of protein), Prostat, Juven, MVI ? ?Estimated body mass index is 26.63 kg/m? as calculated from the following: ?  Height as of this encounter: 5\' 7"  (1.702 m). ?  Weight as of this encounter: 77.1 kg. ? ?Code Status: full code.  ?DVT Prophylaxis:  SCDs Start: 09/03/21 1605 ?enoxaparin (LOVENOX) injection 40 mg Start: 08/29/21 1000 ?Place and maintain sequential compression device Start: 08/28/21 1049 ? ? ?Level of Care: Level of care: Med-Surg ?Family Communication: none at bedside.  ? ?Disposition Plan:     Remains inpatient appropriate:  IV ANTIBIOTICS ? ?Procedures:  ? ? ?Consultants:   ?  ID ? ?Antimicrobials:  ? ?Anti-infectives (From admission, onward)  ? ? Start     Dose/Rate Route Frequency Ordered Stop  ? 09/24/21 1145  fluconazole (DIFLUCAN) tablet 200 mg       ? 200 mg Oral  Once 09/24/21 1059 09/24/21 1410  ? 09/06/21 2200  vancomycin (VANCOREADY) IVPB 1500 mg/300 mL       ? 1,500 mg ?150 mL/hr over 120 Minutes Intravenous Every 12 hours 09/06/21 1257 10/29/21 2359  ? 09/03/21 1355  vancomycin (VANCOCIN) powder  Status:  Discontinued       ?   As needed 09/03/21 1355 09/03/21  1426  ? 09/01/21 2200  vancomycin (VANCOREADY) IVPB 1750 mg/350 mL  Status:  Discontinued       ? 1,750 mg ?175 mL/hr over 120 Minutes Intravenous Every 12 hours 09/01/21 1058 09/06/21 1257  ? 08/30/21 1000  metroNIDAZOLE (F

## 2021-09-25 NOTE — Plan of Care (Signed)

## 2021-09-26 DIAGNOSIS — G061 Intraspinal abscess and granuloma: Secondary | ICD-10-CM | POA: Diagnosis not present

## 2021-09-26 DIAGNOSIS — R7881 Bacteremia: Secondary | ICD-10-CM | POA: Diagnosis not present

## 2021-09-26 DIAGNOSIS — M86111 Other acute osteomyelitis, right shoulder: Secondary | ICD-10-CM | POA: Diagnosis not present

## 2021-09-26 DIAGNOSIS — F418 Other specified anxiety disorders: Secondary | ICD-10-CM | POA: Diagnosis not present

## 2021-09-26 LAB — CBC
HCT: 25.3 % — ABNORMAL LOW (ref 36.0–46.0)
Hemoglobin: 7.9 g/dL — ABNORMAL LOW (ref 12.0–15.0)
MCH: 28.1 pg (ref 26.0–34.0)
MCHC: 31.2 g/dL (ref 30.0–36.0)
MCV: 90 fL (ref 80.0–100.0)
Platelets: 439 10*3/uL — ABNORMAL HIGH (ref 150–400)
RBC: 2.81 MIL/uL — ABNORMAL LOW (ref 3.87–5.11)
RDW: 13.5 % (ref 11.5–15.5)
WBC: 6.9 10*3/uL (ref 4.0–10.5)
nRBC: 0 % (ref 0.0–0.2)

## 2021-09-26 LAB — GLUCOSE, CAPILLARY: Glucose-Capillary: 109 mg/dL — ABNORMAL HIGH (ref 70–99)

## 2021-09-26 MED ORDER — SIMETHICONE 80 MG PO CHEW
80.0000 mg | CHEWABLE_TABLET | Freq: Four times a day (QID) | ORAL | Status: DC | PRN
  Administered 2021-09-26: 80 mg via ORAL
  Filled 2021-09-26: qty 1

## 2021-09-26 NOTE — Plan of Care (Signed)

## 2021-09-26 NOTE — Progress Notes (Signed)
? ? ? [facility] ?                                                                            ? ? ?Kimberly Booker, is a 36 y.o. female, DOB - 06-04-1986, EPP:[id number] ?Admit date - 08/26/2021    ?Outpatient Primary MD for the patient is Pcp, No ? ?LOS - 31  days ? ? ? ?Brief summary  ? ?36 year old with past medical history significant for anxiety, depression, suicidal attempt drug overdose, polysubstance abuse, right clavicle fracture, was admitted at [facility] on 08/15/2021 due to right shoulder abscess.She was treated with I&D in the emergency department followed by IV antibiotics therapy as an inpatient.  She was diagnosed with MRSA bacteremia, endocarditis was ruled out with a TEE performed 3/16, but she continued to have positive blood cultures despite treatment with ceftriaxone and vancomycin.  Antibiotic therapy was changed to daptomycin.  She was then transferred to [facility]. ?Extensive imagings have been done here which showed right clavicle osteomyelitis/phlegmon/abscess, widespread cervical, thoracic, lumbar discitis/osteomyelitis/epidural abscess/cord impingement.  MRI also showed bilateral iliopsoas abscess, right complex pleural effusion.  ID, orthopedics, neurosurgery following. ?Underwent lumbar laminectomy/debridement of abscess, L4-L5 on 3/25. Hospital course also remarkable for finding of right-sided empyema, gastric perforation. Status post emergent laparotomy and perforation of repair by general surgery.  PCCM was also following for chest tube on the right side, which was removed on 3/29. ?Underwent aspiration of the psoas abscesses on 3/30.  ?Underwent debridement of the right clavicle and application of wound VAC to the right shoulder area on 3/31. ?Developed severe lower back pain on 4/2.  MRI was done which showed improvement in the abscess.  Symptoms have improved. ?Pt seen and examined today. Flexiril added to her regimen for back pain and spasms.  ? ?Assessment &  Plan  ? ? ?Assessment and Plan: ?* MRSA bacteremia ?Patient has disseminated MRSA bacteremia.  ?She had TEE outside facility on 3/16, negative for endocarditis as per the report  ?TTE did not show any vegetation, might need TEE here but was delayed due emergent surgery for gastric perforation and other surgical procedures as outlined.   ?Per infectious disease patient will need a total of 8 weeks of antibiotic therapy starting from the day of surgery on 09/03/2021 with an end date on 10/29/2021. ?Patient's blood cultures have been negative so far. ? ?Gastric perforation (HCC) ?Complained of abdominal pain on 3/26.  Chest x-ray done after chest tube placement showed incidental finding of pneumoperitoneum.  CT abdomen/pelvis confirmed pneumoperitoneum.  General surgery consulted and she underwent emergent exploratory laparatomy with finding of gastric perforation.  ?S/p surgical repair and patient is currently on soft diet and able to tolerate. ? ? ?Abscess in epidural space of cervical spine ?Discitis/osteomyelitis at C2-3, C5-6, and C6-7. Ventral epidural abscess at C2-3, C5, and C6 with cord impingement especially at the lower 2 levels. Facet arthritis on the left at C7-T1 and right at C3-4. ?-Also showed discitis/osteomyelitis of T9- T10, T11-T12 ?-Was seen by neurosurgery, no focal deficits at this time. ?Continue with pain management. ?Patient able to ambulate and participate in therapy at this time. ? ?Abscess in epidural space of lumbar spine ?  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 Bilateral facet arthritis at L5-S1. Dorsal epidural abscess at L4 and L5,highly compressive on the thecal sac. ?S/P  lumbar laminectomy, debridement of abscess, L4-L5 on 3/25. ?No further interventions planned by neurosurgery.   ?On 4/2 patient was complaining of severe pain in the lower back radiating down to her legs.  MRI lumbar spine was ordered. ?It redemonstrated the osteomyelitis at  T11-12 and L5-S1.  Noted to be status post L4-L5 laminectomy and debridement with significant decrease in the amount of the abscess.  A small collection was noted posterior to the S1 causing moderate thecal sac narrowing.  Other findings of iliopsoas abscess and sacroiliac septic arthritis were noted as seen previously on MRI pelvis. ?These findings were discussed with neurosurgery.  Patient does not have any focal neurological deficits.  No intervention was recommended.  Patient able to ambulate and no focal deficits seen. ?Continue with therapy and encourage ambulation as tolerated. Pt refusing lovenox.  ? 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 ?Pelvic MRI showed bilateral infectious sacroiliitis and osteomyelitis of the sacrum and periarticular right iliac bone. Bilateral psoas abscesses, Small bilateral paraspinal muscle abscesses, intramuscular abscess within the left piriformis muscle, small intramuscular abscess within the left gluteus medius muscle. ? ? ?Pleural effusion ?Chest CT showed a large loculated right pleural effusion concerning for empyema, subcentimeter groundglass nodularity within the bilateral lungs concerning for septic emboli.  ?S/P chest tube placement.  Fluid noted to be exudative.  Cultures without any growth.  No need for DNase/tPA.   ?Chest x-ray showed resolution of the effusion.  Chest tube was discontinued on 3/29.  Respiratory status is stable.  Patient is currently on room air ? ?Normocytic anemia ?S/p 2 units of PRBC transfusion at an outside facility.  Last hemoglobin on 09/15/2021 around 8.5, dropped gradually to 7.4. will transfuse if less than 7. Cbc pending.  ? ? ?Anxiety with depression ?Noted to be on Prozac and Ativan as needed. ?Apear to be in control.  ? ?Acute osteomyelitis of clavicle, right ([place]) ?MRI of the shoulder showed osteomyelitis, phlegmon, possible abscess.  Underwent debridement of right clavicle and application of wound VAC on  3/31.  ?Pain control and therapy evaluation. ?Continue with pain control. No changes in meds.  ? ? ?Polysubstance abuse ([place]) ?She declined IV drug abuse recently, last drug use was 2 years ago as per patient. ? ?Pain management ?Patient with MRSA infection at multiple sites.  Subsequently with gastric perforation.  Pain management has been difficult. ? ?Transaminitis ?HCV antibody positive ?HCV quantitative was not detected. ? ?LFTs noted to be normal on 4/4 ? ? Vaginal discharge:  ?Vaginal itching. One dose of diflucan ordered.  ? ?Hypotension;  ?Resolved with fluid bolus.  ? ? ?Malnutrition Type: ? ?Nutrition Problem: Increased nutrient needs ?Etiology: acute illness, post-op healing, wound healing ? ? ?Malnutrition Characteristics: ? ?Signs/Symptoms: estimated needs ? ? ?Nutrition Interventions: ? ?Interventions: Ensure Enlive (each supplement provides 350kcal and 20 grams of protein), Prostat, Juven, MVI ? ?Estimated body mass index is 26.63 kg/m? as calculated from the following: ?  Height as of this encounter: 5\' 7"  (1.702 m). ?  Weight as of this encounter: 77.1 kg. ? ?Code Status: full code.  ?DVT Prophylaxis:  SCDs Start: 09/03/21 1605 ?enoxaparin (LOVENOX) injection 40 mg Start: 08/29/21 1000 ?Place and maintain sequential compression device Start: 08/28/21 1049 ? ? ?Level of Care: Level of care: Med-Surg ?Family Communication: none at bedside.  ? ?Disposition Plan:  Remains inpatient appropriate:  IV ANTIBIOTICS ? ?Procedures:  ? ? ?Consultants:   ?ID ? ?Antimicrobials:  ? ?Anti-infectives (From admission, onward)  ? ? Start     Dose/Rate Route Frequency Ordered Stop  ? 09/24/21 1145  fluconazole (DIFLUCAN) tablet 200 mg       ? 200 mg Oral  Once 09/24/21 1059 09/24/21 1410  ? 09/06/21 2200  vancomycin (VANCOREADY) IVPB 1500 mg/300 mL       ? 1,500 mg ?150 mL/hr over 120 Minutes Intravenous Every 12 hours 09/06/21 1257 10/29/21 2359  ? 09/03/21 1355  vancomycin (VANCOCIN) powder  Status:  Discontinued        ?   As needed 09/03/21 1355 09/03/21 1426  ? 09/01/21 2200  vancomycin (VANCOREADY) IVPB 1750 mg/350 mL  Status:  Discontinued       ? 1,750 mg ?175 mL/hr over 120 Minutes Intravenous Every 12 hour

## 2021-09-27 DIAGNOSIS — B9562 Methicillin resistant Staphylococcus aureus infection as the cause of diseases classified elsewhere: Secondary | ICD-10-CM | POA: Diagnosis not present

## 2021-09-27 DIAGNOSIS — R7881 Bacteremia: Secondary | ICD-10-CM | POA: Diagnosis not present

## 2021-09-27 DIAGNOSIS — F418 Other specified anxiety disorders: Secondary | ICD-10-CM | POA: Diagnosis not present

## 2021-09-27 LAB — BASIC METABOLIC PANEL
Anion gap: 7 (ref 5–15)
BUN: 8 mg/dL (ref 6–20)
CO2: 25 mmol/L (ref 22–32)
Calcium: 8.9 mg/dL (ref 8.9–10.3)
Chloride: 104 mmol/L (ref 98–111)
Creatinine, Ser: 0.38 mg/dL — ABNORMAL LOW (ref 0.44–1.00)
GFR, Estimated: >60 mL/min (ref >60)
Glucose, Bld: 92 mg/dL (ref 70–99)
Potassium: 4 mmol/L (ref 3.5–5.1)
Sodium: 136 mmol/L (ref 135–145)

## 2021-09-27 LAB — VANCOMYCIN, PEAK: Vancomycin Pk: 55 ug/mL (ref 30–40)

## 2021-09-27 LAB — VANCOMYCIN, TROUGH: Vancomycin Tr: 12 ug/mL — ABNORMAL LOW (ref 15–20)

## 2021-09-27 MED ORDER — VANCOMYCIN HCL IN DEXTROSE 1-5 GM/200ML-% IV SOLN
1000.0000 mg | Freq: Two times a day (BID) | INTRAVENOUS | Status: DC
  Administered 2021-09-27 – 2021-09-30 (×6): 1000 mg via INTRAVENOUS
  Filled 2021-09-27 (×7): qty 200

## 2021-09-27 NOTE — Plan of Care (Signed)

## 2021-09-27 NOTE — Progress Notes (Signed)
Pharmacy Antibiotic Note ? ?Kimberly Booker is a 36 y.o. female admitted on 08/26/2021 with  disseminated MRSA bacteremia (diffuse discitis/osteomyelitis/epidural abscess of cervical, thoracic and lumbar spine, bilateral septic arthritis, osteomyelitis of R clavicle, R lung empyema) .  ?  ?Patient was initially started on vancomycin 3/13-3/15 along with ceftriaxone 3/13-3/24. Vancomycin was transitioned to daptomycin and given 3/16-3/25. Pt was transferred to [facility] on 08/26/21. ID has consulted pharmacy dose vancomycin for coverage of MRSA R lung empyema.  ?  ?Patient is s/p placement of IR drains into iliopsoas abscesses 3/30 and debridement of right clavicle 3/31. Scr remains stable.  ?Tentative end date: 10/29/21 per ID recs (8 wks) ? ?VP 55 (true Peak 66), Vanc trough 12 ?Calculated AUC 778 ? ?Plan: ?Decrease Vancomycin to 1000 mg IV q12h starting at 12:00 with therapeutic AUC ~517. ?Goal AUC 400-600 ?Plan to check levels after 1200 dose on Wednesday, 4/26 ?Monitor renal function and clinical signs of infection  ? ?Height: 5\' 7"  (170.2 cm) ?Weight: 77.1 kg (170 lb) ?IBW/kg (Calculated) : 61.6 ? ?Temp (24hrs), Avg:98.7 ?F (37.1 ?C), Min:98.1 ?F (36.7 ?C), Max:99.3 ?F (37.4 ?C) ? ?Recent Labs  ?Lab 09/23/21 ?0438 09/25/21 ?1134 09/26/21 ?1427 09/26/21 ?2348 09/27/21 ?09/29/21 09/27/21 ?09/29/21  ?WBC  --  6.9 6.9  --   --   --   ?CREATININE 0.38*  --   --   --  0.38*  --   ?VANCOTROUGH  --   --   --   --   --  12*  ?VANCOPEAK  --   --   --  59*  --   --   ? ?  ?Estimated Creatinine Clearance: 104.1 mL/min (A) (by C-G formula based on SCr of 0.38 mg/dL (L)).   ? ?Allergies  ?Allergen Reactions  ? Amoxicillin Swelling  ?  Throat closes  ? ? ?Antimicrobials this admission: ?Vanc 3/13 >> 3/15 ?Ceftriaxone 3/13 >> 3/24 ?Daptomycin 3/16 >> 3/25 ?Vanc 3/26 >> ? ?Microbiology results: ?3/12 Bcx x2: 4/4 MRSA ?3/12 Chest wound cx: MRSA ?3/14 Bcx: 4/4 MRSA ?3/22 Bcx: ngtd ?3/24 Bcx: ngtd ?3/25 lumbar epidural wound/abscess:  MRSA ?3/31 fungal cx: negative ?3/31 bone cx: negative ? ? ?4/31, PharmD, BCPS ?Clinical Pharmacist ?09/27/2021 11:42 AM  ? ?Please refer to [facility] for pharmacy phone number  ? ?

## 2021-09-27 NOTE — Progress Notes (Signed)
? ? ? [facility] ?                                                                            ? ? ?Kimberly Booker, is a 36 y.o. female, DOB - [birth year], WD:[id number] ?Admit date - 08/26/2021    ?Outpatient Primary MD for the patient is Pcp, No ? ?LOS - 32  days ? ? ? ?Brief summary  ? ?36 year old with past medical history significant for anxiety, depression, suicidal attempt drug overdose, polysubstance abuse, right clavicle fracture, was admitted at [facility] on 08/15/2021 due to right shoulder abscess.She was treated with I&D in the emergency department followed by IV antibiotics therapy as an inpatient.  She was diagnosed with MRSA bacteremia, endocarditis was ruled out with a TEE performed 3/16, but she continued to have positive blood cultures despite treatment with ceftriaxone and vancomycin.  Antibiotic therapy was changed to daptomycin.  She was then transferred to [facility]. ?Extensive imagings have been done here which showed right clavicle osteomyelitis/phlegmon/abscess, widespread cervical, thoracic, lumbar discitis/osteomyelitis/epidural abscess/cord impingement.  MRI also showed bilateral iliopsoas abscess, right complex pleural effusion.  ID, orthopedics, neurosurgery following. ?Underwent lumbar laminectomy/debridement of abscess, L4-L5 on 3/25. Hospital course also remarkable for finding of right-sided empyema, gastric perforation. Status post emergent laparotomy and perforation of repair by general surgery.  PCCM was also following for chest tube on the right side, which was removed on 3/29. ?Underwent aspiration of the psoas abscesses on 3/30.  ?Underwent debridement of the right clavicle and application of wound VAC to the right shoulder area on 3/31. ?Developed severe lower back pain on 4/2.  MRI was done which showed improvement in the abscess.  Symptoms have improved. ?Pt seen and examined today. Flexiril added to her regimen for back pain and spasms.  ? ?Assessment &  Plan  ? ? ?Assessment and Plan: ?* MRSA bacteremia ?Patient has disseminated MRSA bacteremia.  ?She had TEE outside facility on 3/16, negative for endocarditis as per the report  ?TTE did not show any vegetation, might need TEE here but was delayed due emergent surgery for gastric perforation and other surgical procedures as outlined.   ?Per infectious disease patient will need a total of 8 weeks of antibiotic therapy starting from the day of surgery on 09/03/2021 with an end date on 10/29/2021. ?Patient's blood cultures have been negative so far. ? ?Gastric perforation ([place]) ?Complained of abdominal pain on 3/26.  Chest x-ray done after chest tube placement showed incidental finding of pneumoperitoneum.  CT abdomen/pelvis confirmed pneumoperitoneum.  General surgery consulted and she underwent emergent exploratory laparatomy with finding of gastric perforation.  ?S/p surgical repair and patient is currently on soft diet and able to tolerate. ? ? ?Abscess in epidural space of cervical spine ?Discitis/osteomyelitis at C2-3, C5-6, and C6-7. Ventral epidural abscess at C2-3, C5, and C6 with cord impingement especially at the lower 2 levels. Facet arthritis on the left at C7-T1 and right at C3-4. ?-Also showed discitis/osteomyelitis of T9- T10, T11-T12 ?-Was seen by neurosurgery, no focal deficits at this time. ?Continue with pain management. ?Patient able to ambulate and participate in therapy at this time. ? ?Abscess in epidural space of lumbar spine ?  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 Bilateral facet arthritis at L5-S1. Dorsal epidural abscess at L4 and L5,highly compressive on the thecal sac. ?S/P  lumbar laminectomy, debridement of abscess, L4-L5 on 3/25. ?No further interventions planned by neurosurgery.   ?On 4/2 patient was complaining of severe pain in the lower back radiating down to her legs.  MRI lumbar spine was ordered. ?It redemonstrated the osteomyelitis at  T11-12 and L5-S1.  Noted to be status post L4-L5 laminectomy and debridement with significant decrease in the amount of the abscess.  A small collection was noted posterior to the S1 causing moderate thecal sac narrowing.  Other findings of iliopsoas abscess and sacroiliac septic arthritis were noted as seen previously on MRI pelvis. ?These findings were discussed with neurosurgery.  Patient does not have any focal neurological deficits.  No intervention was recommended.  Patient able to ambulate and no focal deficits seen. ?Continue with therapy and encourage ambulation as tolerated. Pt refusing lovenox.  ? 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 ?Pelvic MRI showed bilateral infectious sacroiliitis and osteomyelitis of the sacrum and periarticular right iliac bone. Bilateral psoas abscesses, Small bilateral paraspinal muscle abscesses, intramuscular abscess within the left piriformis muscle, small intramuscular abscess within the left gluteus medius muscle. ? ? ?Pleural effusion ?Chest CT showed a large loculated right pleural effusion concerning for empyema, subcentimeter groundglass nodularity within the bilateral lungs concerning for septic emboli.  ?S/P chest tube placement.  Fluid noted to be exudative.  Cultures without any growth.  No need for DNase/tPA.   ?Chest x-ray showed resolution of the effusion.  Chest tube was discontinued on 3/29.  Respiratory status is stable.  Patient is currently on room air ? ?Normocytic anemia ?S/p 2 units of PRBC transfusion at an outside facility.  Last hemoglobin on 09/15/2021 around 8.5, dropped gradually to 7.4. will transfuse if less than 7. Cbc pending.  ? ? ?Anxiety with depression ?Noted to be on Prozac and Ativan as needed. ?Apear to be in control.  ? ?Acute osteomyelitis of clavicle, right ([place]) ?MRI of the shoulder showed osteomyelitis, phlegmon, possible abscess.  Underwent debridement of right clavicle and application of wound VAC on  3/31.  ?Pain control and therapy evaluation. ?Continue with pain control. No changes in meds.  ? ? ?Polysubstance abuse ([place]) ?She declined IV drug abuse recently, last drug use was 2 years ago as per patient. ? ?Pain management ?Patient with MRSA infection at multiple sites.  Subsequently with gastric perforation.  Pain management has been difficult. ? ?Transaminitis ?HCV antibody positive ?HCV quantitative was not detected. ? ?LFTs noted to be normal on 4/4 ? ? Vaginal discharge:  ?Vaginal itching. One dose of diflucan ordered.  ? ?Hypotension;  ?Resolved with fluid bolus.  ? ? ?Malnutrition Type: ? ?Nutrition Problem: Increased nutrient needs ?Etiology: acute illness, post-op healing, wound healing ? ? ?Malnutrition Characteristics: ? ?Signs/Symptoms: estimated needs ? ? ?Nutrition Interventions: ? ?Interventions: Ensure Enlive (each supplement provides 350kcal and 20 grams of protein), Prostat, Juven, MVI ? ?Estimated body mass index is 26.63 kg/m? as calculated from the following: ?  Height as of this encounter: 5\' 7"  (1.702 m). ?  Weight as of this encounter: 77.1 kg. ? ?Code Status: full code.  ?DVT Prophylaxis:  SCDs Start: 09/03/21 1605 ?enoxaparin (LOVENOX) injection 40 mg Start: 08/29/21 1000 ?Place and maintain sequential compression device Start: 08/28/21 1049 ? ? ?Level of Care: Level of care: Med-Surg ?Family Communication: none at bedside.  ? ?Disposition Plan:  Remains inpatient appropriate:  IV ANTIBIOTICS ? ?Procedures:  ? ? ?Consultants:   ?ID ? ?Antimicrobials:  ? ?Anti-infectives (From admission, onward)  ? ? Start     Dose/Rate Route Frequency Ordered Stop  ? 09/27/21 1200  vancomycin (VANCOCIN) IVPB 1000 mg/200 mL premix       ? 1,000 mg ?200 mL/hr over 60 Minutes Intravenous Every 12 hours 09/27/21 1137 10/29/21 2359  ? 09/24/21 1145  fluconazole (DIFLUCAN) tablet 200 mg       ? 200 mg Oral  Once 09/24/21 1059 09/24/21 1410  ? 09/06/21 2200  vancomycin (VANCOREADY) IVPB 1500 mg/300 mL   Status:  Discontinued       ? 1,500 mg ?150 mL/hr over 120 Minutes Intravenous Every 12 hours 09/06/21 1257 09/27/21 1137  ? 09/03/21 1355  vancomycin (VANCOCIN) powder  Status:  Discontinued       ?   As n

## 2021-09-28 DIAGNOSIS — F418 Other specified anxiety disorders: Secondary | ICD-10-CM | POA: Diagnosis not present

## 2021-09-28 DIAGNOSIS — B9562 Methicillin resistant Staphylococcus aureus infection as the cause of diseases classified elsewhere: Secondary | ICD-10-CM | POA: Diagnosis not present

## 2021-09-28 DIAGNOSIS — R7881 Bacteremia: Secondary | ICD-10-CM | POA: Diagnosis not present

## 2021-09-28 NOTE — Progress Notes (Signed)
Mobility Specialist Progress Note  ? ? 09/28/21 1513  ?Mobility  ?Activity Ambulated with assistance in hallway  ?Level of Assistance Standby assist, set-up cues, supervision of patient - no hands on  ?Assistive Device Four wheel walker  ?Distance Ambulated (ft) 400 ft  ?Activity Response Tolerated well  ?$Mobility charge 1 Mobility  ? ?Pt received in bed and agreeable. Had void in BR. No complaints on walk. Returned to bed with call bell in reach.   ? ?[place] ?Mobility Specialist  ?Primary: [address] Phone: [phone] ?Secondary: [address] Phone: [phone] ?  ?

## 2021-09-28 NOTE — Progress Notes (Signed)
? ? ? [facility] ?                                                                            ? ? ?Kimberly Booker, is a 36 y.o. female, DOB - [birth year], OEV:[id number] ?Admit date - 08/26/2021    ?Outpatient Primary MD for the patient is Pcp, No ? ?LOS - 33  days ? ? ? ?Brief summary  ? ?36 year old with past medical history significant for anxiety, depression, suicidal attempt drug overdose, polysubstance abuse, right clavicle fracture, was admitted at [facility] on 08/15/2021 due to right shoulder abscess.She was treated with I&D in the emergency department followed by IV antibiotics therapy as an inpatient.  She was diagnosed with MRSA bacteremia, endocarditis was ruled out with a TEE performed 3/16, but she continued to have positive blood cultures despite treatment with ceftriaxone and vancomycin.  Antibiotic therapy was changed to daptomycin.  She was then transferred to [facility]. ?Extensive imagings have been done here which showed right clavicle osteomyelitis/phlegmon/abscess, widespread cervical, thoracic, lumbar discitis/osteomyelitis/epidural abscess/cord impingement.  MRI also showed bilateral iliopsoas abscess, right complex pleural effusion.  ID, orthopedics, neurosurgery following. ?Underwent lumbar laminectomy/debridement of abscess, L4-L5 on 3/25. Hospital course also remarkable for finding of right-sided empyema, gastric perforation. Status post emergent laparotomy and perforation of repair by general surgery.  PCCM was also following for chest tube on the right side, which was removed on 3/29. ?Underwent aspiration of the psoas abscesses on 3/30.  ?Underwent debridement of the right clavicle and application of wound VAC to the right shoulder area on 3/31. ?Developed severe lower back pain on 4/2.  MRI was done which showed improvement in the abscess.  Symptoms have improved. ?Flexiril added to her regimen for back pain and spasms with much improvement.  ?She is working with  PT and progressing well.  ? ?Assessment & Plan  ? ? ?Assessment and Plan: ?* MRSA bacteremia ?Patient has disseminated MRSA bacteremia.  ?She had TEE outside facility on 3/16, negative for endocarditis as per the report  ?TTE did not show any vegetation, might need TEE here but was delayed due emergent surgery for gastric perforation and other surgical procedures as outlined.   ?Per infectious disease patient will need a total of 8 weeks of antibiotic therapy starting from the day of surgery on 09/03/2021 with an end date on 10/29/2021. ?Patient's blood cultures have been negative so far. She remains afebrile. Recent wbc count from 4/23 wnl.  ? ?Gastric perforation (HCC) ?Complained of abdominal pain on 3/26.  Chest x-ray done after chest tube placement showed incidental finding of pneumoperitoneum.  CT abdomen/pelvis confirmed pneumoperitoneum.  General surgery consulted and she underwent emergent exploratory laparatomy with finding of gastric perforation.  ?S/p surgical repair and patient is currently on soft diet and able to tolerate. We have advanced diet to soft diet.  ? ? ?Abscess in epidural space of cervical spine ?Discitis/osteomyelitis at C2-3, C5-6, and C6-7. Ventral epidural abscess at C2-3, C5, and C6 with cord impingement especially at the lower 2 levels. Facet arthritis on the left at C7-T1 and right at C3-4. ?-Also showed discitis/osteomyelitis of T9- T10, T11-T12 ?-Was seen by neurosurgery, no focal deficits at  this time. ?Continue with pain management. ?Patient able to ambulate and participate in therapy at this time. ? 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 Bilateral facet arthritis at L5-S1. Dorsal epidural abscess at L4 and L5,highly compressive on the thecal sac. ?S/P  lumbar laminectomy, debridement of abscess, L4-L5 on 3/25. ?No further interventions planned by neurosurgery.   ?On 4/2 patient was complaining of  severe pain in the lower back radiating down to her legs.  MRI lumbar spine was ordered. ?It redemonstrated the osteomyelitis at T11-12 and L5-S1.  Noted to be status post L4-L5 laminectomy and debridement with significant decrease in the amount of the abscess.  A small collection was noted posterior to the S1 causing moderate thecal sac narrowing.  Other findings of iliopsoas abscess and sacroiliac septic arthritis were noted as seen previously on MRI pelvis. ?These findings were discussed with neurosurgery.  Patient does not have any focal neurological deficits.  No intervention was recommended.  Patient able to ambulate and no focal deficits seen. ?Continue with therapy and encourage ambulation as tolerated. Pt refusing lovenox.  ?Wound care consulted for dressing change.  ? ?Iliopsoas abscess (HCC) ?Sacroiliac septic arthritis/osteomyelitis ? ?Pelvic MRI showed bilateral infectious sacroiliitis and osteomyelitis of the sacrum and periarticular right iliac bone. Bilateral psoas abscesses, Small bilateral paraspinal muscle abscesses, intramuscular abscess within the left piriformis muscle, small intramuscular abscess within the left gluteus medius muscle. ? ? ?Pleural effusion ?Chest CT showed a large loculated right pleural effusion concerning for empyema, subcentimeter groundglass nodularity within the bilateral lungs concerning for septic emboli.  ?S/P chest tube placement.  Fluid noted to be exudative.  Cultures without any growth.  No need for DNase/tPA.   ?Chest x-ray showed resolution of the effusion.  Chest tube was discontinued on 3/29.  Respiratory status is stable.  Patient is currently on room air ? ?Normocytic anemia ?S/p 2 units of PRBC transfusion at an outside facility.  Last hemoglobin on 09/15/2021 around 8.5, currently stable around 7.9. no signs of bleeding.  ? ?Anxiety with depression ?Noted to be on Prozac and Ativan as needed. ?In good spirits.   ? ?Acute osteomyelitis of clavicle, right  (HCC) ?MRI of the shoulder showed osteomyelitis, phlegmon, possible abscess.  Underwent debridement of right clavicle and application of wound VAC on 3/31. Wound care consulted for dressing change. ?Pain control and therapy evaluation. ?Continue with pain control. No changes in meds.  ? ? ?Polysubstance abuse (HCC) ?She declined IV drug abuse recently, last drug use was 2 years ago as per patient. ? ?Pain management ?Patient with MRSA infection at multiple sites.  Subsequently with gastric perforation.  Pain management has been difficult. ? ?Transaminitis ?HCV antibody positive ?HCV quantitative was not detected. ? ?LFTs noted to be normal on 4/4 ? ? Vaginal discharge:  ?Vaginal itching. One dose of diflucan ordered with much improvement.  ? ?Hypotension;  ?Resolved with fluid bolus.  ? ? ?Malnutrition Type: ? ?Nutrition Problem: Increased nutrient needs ?Etiology: acute illness, post-op healing, wound healing ? ? ?Malnutrition Characteristics: ? ?Signs/Symptoms: estimated needs ? ? ?Nutrition Interventions: ? ?Interventions: Ensure Enlive (each supplement provides 350kcal and 20 grams of protein), Prostat, Juven, MVI ? ?Estimated body mass index is 26.63 kg/m? as calculated from the following: ?  Height as of this encounter: 5\' 7"  (1.702 m). ?  Weight as of this encounter: 77.1 kg. ? ?Code Status: full code.  ?DVT Prophylaxis:  SCDs Start: 09/03/21 1605 ?enoxaparin (LOVENOX) injection 40  mg Start: 08/29/21 1000 ?Place and maintain sequential compression device Start: 08/28/21 1049 ? ? ?Level of Care: Level of care: Med-Surg ?Family Communication: none at bedside.  ? ?Disposition Plan:     Remains inpatient appropriate:  IV ANTIBIOTICS ? ?Procedures:  ? ? ?Consultants:   ?ID ? ?Antimicrobials:  ? ?Anti-infectives (From admission, onward)  ? ? Start     Dose/Rate Route Frequency Ordered Stop  ? 09/27/21 1200  vancomycin (VANCOCIN) IVPB 1000 mg/200 mL premix       ? 1,000 mg ?200 mL/hr over 60 Minutes Intravenous  Every 12 hours 09/27/21 1137 10/29/21 2359  ? 09/24/21 1145  fluconazole (DIFLUCAN) tablet 200 mg       ? 200 mg Oral  Once 09/24/21 1059 09/24/21 1410  ? 09/06/21 2200  vancomycin (VANCOREADY) IVPB 1500 mg/3

## 2021-09-28 NOTE — Progress Notes (Signed)
Physical Therapy Treatment ?Patient Details ?Name: Kimberly Booker ?MRN: [id number] ?DOB: 10-Dec-1985 ?Today's Date: 09/28/2021 ? ? ?History of Present Illness Pt is a 36 y.o. female initially admitted to [facility] 08/15/21 with R shoulder abscess; admit to [facility] 08/26/21 with MRSA bacteremia. 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 S/p L4-5 laminectomy and abscess debridement 3/25. Pt with pleural effusion s/p R pigtail chest tube 3/26. Abdominal CT showed bowel perforation; s/p ex lap with patch repair on 3/27. S/p R psoas abscess drainage on 3/30. S/p R clavicle I&D with wound vac placement 3/31. Pt developed severe LBP on 4/2; MRI showed improvement in abscess. PMH includes anxiety, depression, polysubstance abuse, R clavicle fx ~8 mo prior to admission. ?  ?PT Comments  ? ? Pt progressing well with mobility, demonstrates improving strength, stability and activity tolerance with mobility and ADL tasks. Pt notes fatigue after ambulation then assisted washup sitting/standing at sink. Reviewed educ, including energy conservation strategies and plan to begin gait training without DME as pt able to tolerate. Will continue to follow acutely to address established goals.  ?   ?Recommendations for follow up therapy are one component of a multi-disciplinary discharge planning process, led by the attending physician.  Recommendations may be updated based on patient status, additional functional criteria and insurance authorization. ? ?Follow Up Recommendations ? Home health PT ?  ?  ?Assistance Recommended at Discharge Intermittent Supervision/Assistance  ?Patient can return home with the following A little help with bathing/dressing/bathroom;Assistance with cooking/housework;Assist for transportation ?  ?Equipment Recommendations ?  (TBD)  ?  ?Recommendations for Other Services   ? ? ?  ?Precautions / Restrictions Precautions ?Precautions: Fall;Back;Other (comment) ?Precaution Comments: R clavicle  wound vac; abdominal precautions for comfort; lumbar corset for comfort (pt did not use session 4/25) ?Restrictions ?Weight Bearing Restrictions: No ?RUE Weight Bearing: Weight bearing as tolerated  ?  ? ?Mobility ? Bed Mobility ?Overal bed mobility: Modified Independent ?  ?  ?  ?  ?  ?  ?  ?  ? ?Transfers ?Overall transfer level: Modified independent ?Equipment used: None, Rolling walker (2 wheels) ?  ?  ?  ?  ?  ?  ?  ?General transfer comment: multiple sit<>stands from EOB, low toilet height and chair without armrests (in front of sink) with and without RW; good awareness of wound vac line ?  ? ?Ambulation/Gait ?Ambulation/Gait assistance: Supervision, Min guard ?Gait Distance (Feet): 360 Feet (+ 24) ?Assistive device: Rolling walker (2 wheels), None ?Gait Pattern/deviations: Step-through pattern, Decreased stride length, Antalgic, Trunk flexed ?Gait velocity: Decreased ?  ?  ?General Gait Details: Slow, steady gait with RW and supervision for safety/lines; additional ambulation in room without DME, pt with slow, guarded gait, able to ambulate holding wound vac in bathroom; required assist to carry wound vac with walk back to bed after doing washup secondary to fatigue ? ? ?Stairs ?  ?  ?  ?  ?  ? ? ?Wheelchair Mobility ?  ? ?Modified Rankin (Stroke Patients Only) ?  ? ? ?  ?Balance Overall balance assessment: Needs assistance ?Sitting-balance support: Feet supported ?Sitting balance-Leahy Scale: Good ?  ?  ?Standing balance support: No upper extremity supported, During functional activity ?Standing balance-Leahy Scale: Fair ?Standing balance comment: improving dynamic standing balance, able to perform washup sitting/standing at sink with intermittent UE support on sink; able to lean over sink with BUE support to wet/wash hair, reliant on UE support, requiring external assist to apply/rinse shampoo ?  ?  ?  ?  ?  ?  ?  ?  High Level Balance Comments: reliant on UE support to reach down to pick wound vac off  floor ?  ?  ?  ?  ? ?  ?Cognition Arousal/Alertness: Awake/alert ?Behavior During Therapy: [employer] for tasks assessed/performed ?Overall Cognitive Status: Within Functional Limits for tasks assessed ?  ?  ?  ?  ?  ?  ?  ?  ?  ?  ?  ?  ?  ?  ?  ?  ?  ?  ?  ? ?  ?Exercises Other Exercises ?Other Exercises: reviewed neck/scap AROM/stretches, pt reports no concerns and doing daily, endorses they help with neck pain ? ?  ?General Comments   ?  ?  ? ?Pertinent Vitals/Pain Pain Assessment ?Pain Assessment: Faces ?Faces Pain Scale: Hurts a little bit ?Pain Location: back ?Pain Descriptors / Indicators: Discomfort ?Pain Intervention(s): Monitored during session  ? ? ?Home Living   ?  ?  ?  ?  ?  ?  ?  ?  ?  ?   ?  ?Prior Function    ?  ?  ?   ? ?PT Goals (current goals can now be found in the care plan section) Acute Rehab PT Goals ?Patient Stated Goal: decreased pain, return home ?PT Goal Formulation: With patient ?Time For Goal Achievement: 10/12/21 ?Potential to Achieve Goals: Good ?Progress towards PT goals: Progressing toward goals ? ?  ?Frequency ? ? ? Min 2X/week ? ? ? ?  ?PT Plan Current plan remains appropriate  ? ? ?Co-evaluation   ?  ?  ?  ?  ? ?  ?AM-PAC PT "6 Clicks" Mobility   ?Outcome Measure ? Help needed turning from your back to your side while in a flat bed without using bedrails?: None ?Help needed moving from lying on your back to sitting on the side of a flat bed without using bedrails?: None ?Help needed moving to and from a bed to a chair (including a wheelchair)?: None ?Help needed standing up from a chair using your arms (e.g., wheelchair or bedside chair)?: None ?Help needed to walk in hospital room?: A Little ?Help needed climbing 3-5 steps with a railing? : A Little ?6 Click Score: 22 ? ?  ?End of Session   ?Activity Tolerance: Patient tolerated treatment well ?Patient left: in bed;with call bell/phone within reach ?Nurse Communication: Mobility status ?PT Visit Diagnosis: Other abnormalities of gait  and mobility (R26.89);Muscle weakness (generalized) (M62.81);Pain ?  ? ? ?Time: 1610-9604 ?PT Time Calculation (min) (ACUTE ONLY): 34 min ? ?Charges:  $Gait Training: 8-22 mins ?$Therapeutic Activity: 8-22 mins          ?          ? ?Ina Homes, PT, DPT ?Acute Rehabilitation Services  ?Pager [phone] ?Office [phone] ? ?Malachy Chamber ?09/28/2021, 12:52 PM ? ?

## 2021-09-29 DIAGNOSIS — B9562 Methicillin resistant Staphylococcus aureus infection as the cause of diseases classified elsewhere: Secondary | ICD-10-CM | POA: Diagnosis not present

## 2021-09-29 DIAGNOSIS — R7881 Bacteremia: Secondary | ICD-10-CM | POA: Diagnosis not present

## 2021-09-29 LAB — FUNGUS CULTURE WITH STAIN

## 2021-09-29 LAB — FUNGUS CULTURE RESULT

## 2021-09-29 LAB — FUNGAL ORGANISM REFLEX

## 2021-09-29 MED ORDER — ENSURE ENLIVE PO LIQD
237.0000 mL | Freq: Three times a day (TID) | ORAL | Status: DC
  Administered 2021-09-29 – 2021-10-28 (×53): 237 mL via ORAL
  Filled 2021-09-29: qty 237

## 2021-09-29 NOTE — Progress Notes (Signed)
?Progress Note ? ?Patient: Kimberly Booker [id number] DOB: 1986-05-20  ?DOA: 08/26/2021  DOS: 09/29/2021  ?  ?Brief hospital course: ?36 year old with past medical history significant for anxiety, depression, suicidal attempt drug overdose, polysubstance abuse, right clavicle fracture, was admitted at [facility] on 08/15/2021 due to right shoulder abscess.She was treated with I&D in the emergency department followed by IV antibiotics therapy as an inpatient.  She was diagnosed with MRSA bacteremia, endocarditis was ruled out with a TEE performed 3/16, but she continued to have positive blood cultures despite treatment with ceftriaxone and vancomycin.  Antibiotic therapy was changed to daptomycin.  She was then transferred to [facility]. ?Extensive imagings have been done here which showed right clavicle osteomyelitis/phlegmon/abscess, widespread cervical, thoracic, lumbar discitis/osteomyelitis/epidural abscess/cord impingement.  MRI also showed bilateral iliopsoas abscess, right complex pleural effusion.  ID, orthopedics, neurosurgery following. ?Underwent lumbar laminectomy/debridement of abscess, L4-L5 on 3/25. Hospital course also remarkable for finding of right-sided empyema, gastric perforation. Status post emergent laparotomy and perforation of repair by general surgery.  PCCM was also following for chest tube on the right side, which was removed on 3/29. ?Underwent aspiration of the psoas abscesses on 3/30.  ?Underwent debridement of the right clavicle and application of wound VAC to the right shoulder area on 3/31. ?Developed severe lower back pain on 4/2.  MRI was done which showed improvement in the abscess.  Symptoms have improved. ?Flexiril added to her regimen for back pain and spasms with much improvement.  ?She is working with PT and progressing well.  ? ?Assessment and Plan: ?MRSA bacteremia ?Patient has disseminated MRSA bacteremia.  ?She had TEE outside facility on 3/16, negative for  endocarditis as per the report . TTE did not show any vegetation, might need TEE here but was delayed due emergent surgery for gastric perforation and other surgical procedures as outlined.   ?- Per infectious disease patient will need a total of 8 weeks of antibiotic therapy starting from the day of surgery on 09/03/2021 with an end date on 10/29/2021. Check vancomycin peak and trough. Recheck BMP weekly, next check tmrw. Blood cultures cleared as of the draw on 4/9.  ?  ?Gastric perforation ([place]) ?Complained of abdominal pain on 3/26.  Chest x-ray done after chest tube placement showed incidental finding of pneumoperitoneum.  CT abdomen/pelvis confirmed pneumoperitoneum.  General surgery consulted and she underwent emergent exploratory laparatomy with finding of gastric perforation.  ?S/p surgical repair and patient is currently on soft diet and able to tolerate. We have advanced diet to soft diet.  ?  ?Abscess in epidural space of cervical spine ?Discitis/osteomyelitis at C2-3, C5-6, and C6-7. Ventral epidural abscess at C2-3, C5, and C6 with cord impingement especially at the lower 2 levels. Facet arthritis on the left at C7-T1 and right at C3-4. ?-Also showed discitis/osteomyelitis of T9- T10, T11-T12 ?-Was seen by neurosurgery, no focal deficits at this time. ?Continue with pain management. ?Patient able to ambulate and participate in therapy at this time. ?  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 Bilateral facet arthritis at L5-S1. Dorsal epidural abscess at L4 and L5,highly compressive on the thecal sac. ?S/P  lumbar laminectomy, debridement of abscess, L4-L5 on 3/25. ?No further interventions planned by neurosurgery.   ?On 4/2 patient was complaining of severe pain in the lower back radiating down to her legs.  MRI lumbar spine was ordered. ?It redemonstrated the osteomyelitis at T11-12 and L5-S1.  Noted to be  status post  L4-L5 laminectomy and debridement with significant decrease in the amount of the abscess.  A small collection was noted posterior to the S1 causing moderate thecal sac narrowing.  Other findings of iliopsoas abscess and sacroiliac septic arthritis were noted as seen previously on MRI pelvis. ?These findings were discussed with neurosurgery.  Patient does not have any focal neurological deficits.  No intervention was recommended.  Patient able to ambulate and no focal deficits seen. ?Continue with therapy and encourage ambulation as tolerated. Pt refusing lovenox.  ?Wound care consulted for dressing change.  ?  ?Iliopsoas abscess ([place]) ?Sacroiliac septic arthritis/osteomyelitis ?  ?Pelvic MRI showed bilateral infectious sacroiliitis and osteomyelitis of the sacrum and periarticular right iliac bone. Bilateral psoas abscesses, Small bilateral paraspinal muscle abscesses, intramuscular abscess within the left piriformis muscle, small intramuscular abscess within the left gluteus medius muscle. ?  ?  ?Pleural effusion ?Chest CT showed a large loculated right pleural effusion concerning for empyema, subcentimeter groundglass nodularity within the bilateral lungs concerning for septic emboli.  ?S/P chest tube placement.  Fluid noted to be exudative.  Cultures without any growth.  No need for DNase/tPA.   ?Chest x-ray showed resolution of the effusion.  Chest tube was discontinued on 3/29. Stable on room air. ?  ?Normocytic anemia ?S/p 2 units of PRBC transfusion at an outside facility.  Last hemoglobin on 09/15/2021 around 8.5, currently stable around 7.9. no signs of bleeding.  ?- Recheck weekly.  ?  ?Anxiety with depression ?Noted to be on Prozac and Ativan as needed. ?  ?Acute osteomyelitis of clavicle, right: s/p debridement of right clavicle and application of wound VAC on 3/31. Wound care consulted for dressing change. ?- Continue routine wound care.  ?  ?Polysubstance abuse ([place]): Reports last use was 2 years ago. ?   ?Acute pain: Due to multifocal infections.  ?- Continue fentanyl patch and prn percocet. Will plan to taper gradually as she would be best served by coming off opioids at discharge.   ?  ?Transaminitis: Resolved. +HepCAb with undetectable quant.  ?  ?Vaginal discharge: Resolved s/p diflucan  ?  ?Hypotension;  ?Resolved with fluid bolus.  ? ?Subjective: No new complaints ? ?Objective: ?Vitals:  ? 09/29/21 0455 09/29/21 0720 09/29/21 0741 09/29/21 1509  ?BP: 104/62  (!) 92/58 (!) 97/56  ?Pulse: 85  85 79  ?Resp: 16  14   ?Temp: 98.3 ?F (36.8 ?C)  99.3 ?F (37.4 ?C) 98.3 ?F (36.8 ?C)  ?TempSrc:   Oral Oral  ?SpO2: 94% 96% 96% (!) 89%  ?Weight:      ?Height:      ? ?Gen: Chronically ill-appearing 36 y.o. female in no distress ?Pulm: Nonlabored breathing room air. Diminished ?CV: Regular rate and rhythm. No murmur, rub, or gallop. No JVD, no dependent edema. ?GI: Abdomen soft, non-tender, non-distended, with normoactive bowel sounds.  ?Ext: Warm, no deformities ?Skin: Right anterior shoulder with wound vac in place without erythema or exudate. No other acute rashes, lesions or ulcers on visualized skin. ?Neuro: Alert and oriented. No focal neurological deficits. ?Psych: Judgement and insight appear fair. Mood euthymic & affect congruent. Behavior is appropriate.   ? ?Family Communication: None at bedside ? ?Disposition: ?Status is: Inpatient ?Remains inpatient appropriate because: Needs IV antibiotics ?Planned Discharge Destination: Home ? ? ? ? ? ?Patrecia Pour, MD ?09/29/2021 5:58 PM ?Page by [email or web address]  ?

## 2021-09-29 NOTE — Progress Notes (Signed)
Nutrition Follow-up ? ?DOCUMENTATION CODES:  ? ?Not applicable ? ?INTERVENTION:  ?Provide Ensure Enlive po TID, each supplement provides 350 kcal and 20 grams of protein. ? ?Continue MVI once daily. ? ?NUTRITION DIAGNOSIS:  ? ?Increased nutrient needs related to acute illness, post-op healing, wound healing as evidenced by estimated needs; ongoing ? ?GOAL:  ? ?Patient will meet greater than or equal to 90% of their needs; ongoing ? ?MONITOR:  ? ?Diet advancement, PO intake, Supplement acceptance, Labs, Weight trends, Skin ? ?REASON FOR ASSESSMENT:  ? ?Consult ?Assessment of nutrition requirement/status ? ?ASSESSMENT:  ? ?36 year old female with medical history of anxiety, depression, suicide attempt by drug OD, polysubstance abuse, R clavicle fx 8 months ago. She presented to the ED ongoing R shoulder pain. She had gone to another hospital 2 weeks ago and found to have R shoulder abscess which was treated with I&D and IV abx. She was dx with MRSA bacteremia and endocarditis was r/o on TEE on 3/16. She was transferred from [facility] to [facility] due to ongoing positive blood cultures despite treatment. Once at [facility], extensive imaging showed R clavicle osteomyelitis/phlegmon/abscess, widespread cervical,thoracic,lumbar discitis/osteomyelitis/epidural abscess/cord impingement. MRI showed bilateral iliopsoas abscess, R complex pleural effusion. ID, Orthopedics, and Neurosurgery following.  Currently on daptomycin. Plan for transthoracic echo. Neurosurgery planning for lumbar laminectomy for debridement of abscess, L4-L5. ? ?3/25 - s/p lumbar laminectomy/debridement of abscess, L4-L5 ?3/27 - s/p dx laparoscopy, exploratory laparotomy, graham patch repair of gastric ulcer perforation; NGT placed to suction ?3/30 - NGT removed ?3/31 - s/p debridement R clavicle and application of woundVAC ?4/02 - MRI spine redemonstrated osteomyelitis at T11-12 and L5-S1 ? ?Per infectious disease patient will need a  total of 8 weeks of antibiotic therapy starting from the day of surgery on 09/03/2021 with an end date on 10/29/2021. ? ?Meal completion has been 0-100%. Pt reports no meal completion at breakfast this morning, however did consume her Ensure. RD to increase Ensure to TID to aid in caloric and protein needs. Pt encouraged to eat her food at meals and to drink her supplement. Labs and medications reviewed.  ? ?Diet Order:   ?Diet Order   ? ?       ?  Diet regular Room service appropriate? Yes; Fluid consistency: Thin  Diet effective now       ?  ? ?  ?  ? ?  ? ? ?EDUCATION NEEDS:  ? ?Not appropriate for education at this time ? ?Skin:  Skin Assessment: Skin Integrity Issues: ?Skin Integrity Issues:: Wound VAC ?Wound Vac: R axilla ?Incisions: throat, back, abdomen ? ?Last BM:  4/23 ? ?Height:  ? ?Ht Readings from Last 1 Encounters:  ?09/02/21 5\' 7"  (1.702 m)  ? ? ?Weight:  ? ?Wt Readings from Last 1 Encounters:  ?09/02/21 77.1 kg  ? ? ?Ideal Body Weight:  61.4 kg ? ?BMI:  Body mass index is 26.63 kg/m?. ? ?Estimated Nutritional Needs:  ? ?Kcal:  2200-2400 ? ?Protein:  110-125 grams ? ?Fluid:  >/= 2.2 L/day ? ?09/04/21, MS, RD, LDN ?RD pager number/after hours weekend pager number on Amion. ? ?

## 2021-09-29 NOTE — Progress Notes (Signed)
Occupational Therapy Treatment ?Patient Details ?Name: Kimberly Booker ?MRN: [id number] ?DOB: September 11, 1985 ?Today's Date: 09/29/2021 ? ? ?History of present illness Pt is a 36 y.o. female initially admitted to [facility] 08/15/21 with R shoulder abscess; admit to [facility] 08/26/21 with MRSA bacteremia. 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 S/p L4-5 laminectomy and abscess debridement 3/25. Pt with pleural effusion s/p R pigtail chest tube 3/26. Abdominal CT showed bowel perforation; s/p ex lap with patch repair on 3/27. S/p R psoas abscess drainage on 3/30. S/p R clavicle I&D with wound vac placement 3/31. Pt developed severe LBP on 4/2; MRI showed improvement in abscess. PMH includes anxiety, depression, polysubstance abuse, R clavicle fx ~8 mo prior to admission. ?  ?OT comments ? Pt making incremental progress with all ADL's. This session, overall without RW, pt is at supervision level completing dynamic standing tasks, grooming, and shower transfers on a shower seat. She needs no physical assist to stand, only close supervision for safety. OT will continue to follow acutely to maximize her independence.   ? ?Recommendations for follow up therapy are one component of a multi-disciplinary discharge planning process, led by the attending physician.  Recommendations may be updated based on patient status, additional functional criteria and insurance authorization. ?   ?Follow Up Recommendations ? No OT follow up  ?  ?Assistance Recommended at Discharge Intermittent Supervision/Assistance  ?Patient can return home with the following ? Assist for transportation;Help with stairs or ramp for entrance;A little help with walking and/or transfers;A little help with bathing/dressing/bathroom ?  ?Equipment Recommendations ? Tub/shower seat;BSC/3in1  ?  ?Recommendations for Other Services   ? ?  ?Precautions / Restrictions Precautions ?Precautions: Fall;Back;Other (comment) ?Restrictions ?Weight Bearing  Restrictions: No ?RUE Weight Bearing: Weight bearing as tolerated  ? ? ?  ? ?Mobility Bed Mobility ?Overal bed mobility: Modified Independent ?  ?  ?  ?  ?  ?  ?  ?  ? ?Transfers ?Overall transfer level: Modified independent ?Equipment used: None ?  ?  ?  ?  ?  ?  ?  ?  ?  ?  ?Balance Overall balance assessment: Needs assistance ?Sitting-balance support: Feet supported ?Sitting balance-Leahy Scale: Good ?Sitting balance - Comments: Indep with donning LB clothes sitting EOB ?  ?Standing balance support: No upper extremity supported, During functional activity ?Standing balance-Leahy Scale: Fair ?Standing balance comment: improving dynamic standing balance, cleaning tray table, rearranging laundry and grooming in standing this session ?  ?  ?  ?  ?  ?  ?  ?  ?  ?  ?  ?   ? ?ADL either performed or assessed with clinical judgement  ? ?ADL Overall ADL's : Needs assistance/impaired ?Eating/Feeding: Independent;Sitting ?  ?Grooming: Wash/dry hands;Wash/dry face;Oral care;Applying deodorant;Supervision/safety;Standing ?Grooming Details (indicate cue type and reason): completed at sink ?  ?  ?  ?  ?  ?  ?  ?  ?Toilet Transfer: Supervision/safety;Rolling walker (2 wheels);Regular Toilet;Ambulation ?Toilet Transfer Details (indicate cue type and reason): completed in bathroom ?Toileting- Clothing Manipulation and Hygiene: Supervision/safety;Sit to/from stand ?Toileting - Clothing Manipulation Details (indicate cue type and reason): for pericare ?Tub/ Shower Transfer: Walk-in shower;Supervision/safety;Shower seat;Grab bars ?Tub/Shower Transfer Details (indicate cue type and reason): completed in bathroom, overall no assist needed - needs to work on tub transfer next ?Functional mobility during ADLs: Rolling walker (2 wheels);Supervision/safety ?General ADL Comments: Pt working on cleaning up her tray table in standing, rearranging her laundry, and grooming ADL's to address dynamic  balance and standing tolerance ?   ? ?Extremity/Trunk Assessment   ?  ?  ?  ?  ?  ? ?Vision   ?  ?  ?Perception   ?  ?Praxis   ?  ? ?Cognition Arousal/Alertness: Awake/alert ?Behavior During Therapy: [facility] for tasks assessed/performed ?Overall Cognitive Status: Within Functional Limits for tasks assessed ?  ?  ?  ?  ?  ?  ?  ?  ?  ?  ?  ?  ?  ?  ?  ?  ?  ?  ?  ?   ?Exercises   ? ?  ?Shoulder Instructions   ? ? ?  ?General Comments VSS on RA  ? ? ?Pertinent Vitals/ Pain       Pain Assessment ?Pain Assessment: Faces ?Faces Pain Scale: Hurts little more ?Pain Location: back ?Pain Descriptors / Indicators: Discomfort ?Pain Intervention(s): Monitored during session, Repositioned ? ?Home Living   ?  ?  ?  ?  ?  ?  ?  ?  ?  ?  ?  ?  ?  ?  ?  ?  ?  ?  ? ?  ?Prior Functioning/Environment    ?  ?  ?  ?   ? ?Frequency ? Min 2X/week  ? ? ? ? ?  ?Progress Toward Goals ? ?OT Goals(current goals can now be found in the care plan section) ? Progress towards OT goals: Progressing toward goals ? ?Acute Rehab OT Goals ?Patient Stated Goal: To be independent again ?OT Goal Formulation: With patient ?Time For Goal Achievement: 10/13/21 ?Potential to Achieve Goals: Good ?ADL Goals ?Pt Will Perform Upper Body Dressing: with modified independence;sitting;standing ?Pt Will Perform Lower Body Dressing: with modified independence;sit to/from stand;sitting/lateral leans ?Pt Will Transfer to Toilet: with modified independence;ambulating;regular height toilet ?Pt Will Perform Tub/Shower Transfer: with modified independence;Shower transfer;Tub transfer;rolling walker;ambulating;3 in 1  ?Plan Frequency remains appropriate;Discharge plan remains appropriate   ? ?Co-evaluation ? ? ?   ?  ?  ?  ?  ? ?  ?AM-PAC OT "6 Clicks" Daily Activity     ?Outcome Measure ? ? Help from another person eating meals?: None ?Help from another person taking care of personal grooming?: A Little ?Help from another person toileting, which includes using toliet, bedpan, or urinal?: None ?Help from another  person bathing (including washing, rinsing, drying)?: A Little ?Help from another person to put on and taking off regular upper body clothing?: A Little ?Help from another person to put on and taking off regular lower body clothing?: A Little ?6 Click Score: 20 ? ?  ?End of Session Equipment Utilized During Treatment: Gait belt;Rolling walker (2 wheels) ? ?OT Visit Diagnosis: Unsteadiness on feet (R26.81);Other abnormalities of gait and mobility (R26.89);Muscle weakness (generalized) (M62.81);Pain ?  ?Activity Tolerance Patient tolerated treatment well ?  ?Patient Left in bed;with call bell/phone within reach ?  ?Nurse Communication Mobility status ?  ? ?   ? ?Time: 8182-9937 ?OT Time Calculation (min): 26 min ? ?Charges: OT General Charges ?$OT Visit: 1 Visit ?OT Treatments ?$Self Care/Home Management : 23-37 mins ?Agape Hardiman H., OTR/L ?Acute Rehabilitation ? ? ?Ilia Dimaano Elane Bing Plume ?09/29/2021, 5:51 PM ?

## 2021-09-29 NOTE — Consult Note (Signed)
WOC consulted by bedside nursing for NPWT dressing. Reviewed chart. Patient has Prevena dressing placed 09/03/21 per Dr. Lajoyce Corners after debridement of clavicle wound.  Appears Prevena dressing attached to hospital [facility] with plans at DC to hook to disposable Prevena unit.  She however has remained inpatient. ?Advised bedside nursing and included Dr. Lajoyce Corners in communication on orders for further care of this site.  ? ?Re consult if needed, will not follow at this time. ?Thanks ? Blanche Gallien [facility] MSN, RN,CWOCN, CNS, CWON-AP [phone])  ?

## 2021-09-29 NOTE — Progress Notes (Signed)
Mobility Specialist Progress Note  ? ? 09/29/21 1408  ?Mobility  ?Activity Ambulated independently in hallway  ?Level of Assistance Independent after set-up  ?Assistive Device Four wheel walker  ?Distance Ambulated (ft) 400 ft  ?Activity Response Tolerated well  ?$Mobility charge 1 Mobility  ? ?Pt received in bed and agreeable. No complaints on walk. Returned to bed with call bell in reach.   ? ?[place] ?Mobility Specialist  ?Primary: 5N M.S. Phone: [phone] ?Secondary: 6N M.S. Phone: [phone] ?  ?

## 2021-09-30 LAB — CBC
HCT: 23.6 % — ABNORMAL LOW (ref 36.0–46.0)
Hemoglobin: 7.4 g/dL — ABNORMAL LOW (ref 12.0–15.0)
MCH: 27.9 pg (ref 26.0–34.0)
MCHC: 31.4 g/dL (ref 30.0–36.0)
MCV: 89.1 fL (ref 80.0–100.0)
Platelets: 389 10*3/uL (ref 150–400)
RBC: 2.65 MIL/uL — ABNORMAL LOW (ref 3.87–5.11)
RDW: 13.6 % (ref 11.5–15.5)
WBC: 7 10*3/uL (ref 4.0–10.5)
nRBC: 0 % (ref 0.0–0.2)

## 2021-09-30 LAB — VANCOMYCIN, TROUGH: Vancomycin Tr: 10 ug/mL — ABNORMAL LOW (ref 15–20)

## 2021-09-30 LAB — BASIC METABOLIC PANEL
Anion gap: 7 (ref 5–15)
BUN: 12 mg/dL (ref 6–20)
CO2: 28 mmol/L (ref 22–32)
Calcium: 8.8 mg/dL — ABNORMAL LOW (ref 8.9–10.3)
Chloride: 100 mmol/L (ref 98–111)
Creatinine, Ser: 0.4 mg/dL — ABNORMAL LOW (ref 0.44–1.00)
GFR, Estimated: >60 mL/min (ref >60)
Glucose, Bld: 106 mg/dL — ABNORMAL HIGH (ref 70–99)
Potassium: 4 mmol/L (ref 3.5–5.1)
Sodium: 135 mmol/L (ref 135–145)

## 2021-09-30 LAB — GLUCOSE, CAPILLARY: Glucose-Capillary: 109 mg/dL — ABNORMAL HIGH (ref 70–99)

## 2021-09-30 LAB — VANCOMYCIN, PEAK: Vancomycin Pk: 30 ug/mL (ref 30–40)

## 2021-09-30 MED ORDER — VANCOMYCIN HCL 750 MG/150ML IV SOLN
750.0000 mg | Freq: Three times a day (TID) | INTRAVENOUS | Status: DC
  Administered 2021-09-30 – 2021-10-01 (×4): 750 mg via INTRAVENOUS
  Filled 2021-09-30 (×8): qty 150

## 2021-09-30 NOTE — Progress Notes (Signed)
?Progress Note ? ?Patient: Kimberly Booker WUJ:[id number] DOB: 04-24-1986  ?DOA: 08/26/2021  DOS: 09/30/2021  ?  ?Brief hospital course: ?36 year old with past medical history significant for anxiety, depression, suicidal attempt drug overdose, polysubstance abuse, right clavicle fracture, was admitted at [facility] on 08/15/2021 due to right shoulder abscess.She was treated with I&D in the emergency department followed by IV antibiotics therapy as an inpatient.  She was diagnosed with MRSA bacteremia, endocarditis was ruled out with a TEE performed 3/16, but she continued to have positive blood cultures despite treatment with ceftriaxone and vancomycin.  Antibiotic therapy was changed to daptomycin.  She was then transferred to [facility]. ?Extensive imagings have been done here which showed right clavicle osteomyelitis/phlegmon/abscess, widespread cervical, thoracic, lumbar discitis/osteomyelitis/epidural abscess/cord impingement.  MRI also showed bilateral iliopsoas abscess, right complex pleural effusion.  ID, orthopedics, neurosurgery following. ?Underwent lumbar laminectomy/debridement of abscess, L4-L5 on 3/25. Hospital course also remarkable for finding of right-sided empyema, gastric perforation. Status post emergent laparotomy and perforation of repair by general surgery.  PCCM was also following for chest tube on the right side, which was removed on 3/29. ?Underwent aspiration of the psoas abscesses on 3/30.  ?Underwent debridement of the right clavicle and application of wound VAC to the right shoulder area on 3/31. ?Developed severe lower back pain on 4/2.  MRI was done which showed improvement in the abscess.  Symptoms have improved. ?Flexiril added to her regimen for back pain and spasms with much improvement.  ?She is working with PT and progressing well.  ? ?Assessment and Plan: ?MRSA bacteremia ?Patient has disseminated MRSA bacteremia.  ?She had TEE outside facility on 3/16, negative for  endocarditis as per the report . TTE did not show any vegetation, might need TEE here but was delayed due emergent surgery for gastric perforation and other surgical procedures as outlined.   ?- Per infectious disease patient will need a total of 8 weeks of antibiotic therapy starting from the day of surgery on 09/03/2021 with an end date on 10/29/2021. Vancomycin trough subtherapeutic, augmenting dose per pharmacy. SCr stable, wnl. Blood cultures cleared as of the draw on 4/9.  ?  ?Gastric perforation (HCC) ?Complained of abdominal pain on 3/26.  Chest x-ray done after chest tube placement showed incidental finding of pneumoperitoneum.  CT abdomen/pelvis confirmed pneumoperitoneum.  General surgery consulted and she underwent emergent exploratory laparatomy with finding of gastric perforation.  ?- S/p surgical repair and patient is currently on soft diet and able to tolerate. We have advanced diet to soft diet.  ?  ?Abscess in epidural space of cervical spine ?Discitis/osteomyelitis at C2-3, C5-6, and C6-7. Ventral epidural abscess at C2-3, C5, and C6 with cord impingement especially at the lower 2 levels. Facet arthritis on the left at C7-T1 and right at C3-4. ?-Also showed discitis/osteomyelitis of T9- T10, T11-T12 ?-Was seen by neurosurgery, no focal deficits at this time. ?Continue with pain management. ?Patient able to ambulate and participate in therapy at this time. ?  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 Bilateral facet arthritis at L5-S1. Dorsal epidural abscess at L4 and L5,highly compressive on the thecal sac. ?S/P  lumbar laminectomy, debridement of abscess, L4-L5 on 3/25. ?No further interventions planned by neurosurgery.   ?On 4/2 patient was complaining of severe pain in the lower back radiating down to her legs.  MRI lumbar spine was ordered. ?It redemonstrated the osteomyelitis at T11-12 and L5-S1.  Noted to be  status post L4-L5 laminectomy and debridement with significant decrease in the amount of the abscess.  A small collection was noted posterior to the S1 causing moderate thecal sac narrowing.  Other findings of iliopsoas abscess and sacroiliac septic arthritis were noted as seen previously on MRI pelvis. ?These findings were discussed with neurosurgery.  Patient does not have any focal neurological deficits.  No intervention was recommended.  Patient able to ambulate and no focal deficits seen. ?Continue with therapy and encourage ambulation as tolerated. Pt refusing lovenox.  ?Wound care consulted for dressing change.  ?  ?Iliopsoas abscess (HCC) ?Sacroiliac septic arthritis/osteomyelitis. Pelvic MRI showed bilateral infectious sacroiliitis and osteomyelitis of the sacrum and periarticular right iliac bone. Bilateral psoas abscesses, Small bilateral paraspinal muscle abscesses, intramuscular abscess within the left piriformis muscle, small intramuscular abscess within the left gluteus medius muscle. ?  ?Pleural effusion ?Chest CT showed a large loculated right pleural effusion concerning for empyema, subcentimeter groundglass nodularity within the bilateral lungs concerning for septic emboli.  ?S/P chest tube placement.  Fluid noted to be exudative.  Cultures without any growth.  No need for DNase/tPA.   ?Chest x-ray showed resolution of the effusion.  Chest tube was discontinued on 3/29. Stable on room air. ?  ?Normocytic anemia ?S/p 2 units of PRBC transfusion at an outside facility.  Last hemoglobin on 09/15/2021 around 8.5, currently stable around 7.9. no signs of bleeding.  ?- Recheck weekly.  ?  ?Anxiety with depression ?- Noted to be on Prozac and Ativan as needed. ?  ?Acute osteomyelitis of clavicle, right: s/p debridement of right clavicle and application of wound VAC on 3/31. Wound care consulted for dressing change. ?- Continue routine wound care.  ?  ?Polysubstance abuse (HCC): Reports last use was 2  years ago. ?  ?Acute pain: Due to multifocal infections.  ?- Continue fentanyl patch and prn percocet. Will plan to taper gradually as she would be best served by coming off opioids at discharge.   ?  ?Transaminitis: Resolved. +HepCAb with undetectable quant.  ?  ?Vaginal discharge: Resolved s/p diflucan  ?  ?Hypotension;  ?Resolved with fluid bolus.  ? ?Subjective: Pain mostly in the back and mostly with movement is stable, improved with medications.  ? ?Objective: ?Vitals:  ? 09/29/21 0741 09/29/21 1509 09/29/21 1919 09/30/21 0737  ?BP: (!) 92/58 (!) 97/56 100/64 106/66  ?Pulse: 85 79 90 98  ?Resp: 14  20 18   ?Temp: 99.3 ?F (37.4 ?C) 98.3 ?F (36.8 ?C) 98.1 ?F (36.7 ?C) 98 ?F (36.7 ?C)  ?TempSrc: Oral Oral  Oral  ?SpO2: 96% (!) 89% 91% 97%  ?Weight:      ?Height:      ? ?Gen: 36 y.o. female in no distress ?Pulm: Nonlabored breathing room air. Clear. ?CV: Regular rate and rhythm. No murmur, rub, or gallop. No JVD, no dependent edema. ?GI: Abdomen soft, non-tender, non-distended, with normoactive bowel sounds.  ?Ext: Warm, no deformities ?Skin: Right anterior shoulder with normal suction on NPWT, edges without erythema. No new rashes, lesions or ulcers on visualized skin. ?Neuro: Alert and oriented. No focal neurological deficits. ?Psych: Judgement and insight appear fair. Mood euthymic & affect congruent. Behavior is appropriate.   ? ?Family Communication: None at bedside ? ?Disposition: ?Status is: Inpatient ?Remains inpatient appropriate because: Needs IV antibiotics with end date 10/29/2021 ?Planned Discharge Destination: Home ? ?10/31/2021, MD ?09/30/2021 12:35 PM ?Page by [email or web address]  ?

## 2021-09-30 NOTE — Progress Notes (Signed)
Pharmacy Antibiotic Note ? ?Kimberly Booker is a 36 y.o. female admitted on 08/26/2021 with  disseminated MRSA bacteremia (diffuse discitis/osteomyelitis/epidural abscess of cervical, thoracic and lumbar spine, bilateral septic arthritis, osteomyelitis of R clavicle, R lung empyema) .  ?  ?Patient was initially started on vancomycin 3/13-3/15 along with ceftriaxone 3/13-3/24. Vancomycin was transitioned to daptomycin and given 3/16-3/25. Pt was transferred to [facility] on 08/26/21. ID has consulted pharmacy dose vancomycin for coverage of MRSA R lung empyema.  ?  ?Patient is s/p placement of IR drains into iliopsoas abscesses 3/30 and debridement of right clavicle 3/31. Scr remains stable.  ?Tentative end date: 10/29/21 per ID recs (8 wks) ? ?VP 30 (true Peak 38.5), Vanc trough 10 (true trough 8.5) ?Calculated AUC 476.7.  ?Due to low trough will change vancomycin to 750mg  q8h to achieve a trough of 10-15 and AUC of 535.  ? ?Plan: ?Change Vancomycin to 750 mg IV q8h starting at 12:00  ?Goal AUC 400-600 ? ?Monitor renal function and clinical signs of infection  ? ?Height: 5\' 7"  (170.2 cm) ?Weight: 77.1 kg (170 lb) ?IBW/kg (Calculated) : 61.6 ? ?Temp (24hrs), Avg:98.1 ?F (36.7 ?C), Min:98 ?F (36.7 ?C), Max:98.3 ?F (36.8 ?C) ? ?Recent Labs  ?Lab 09/25/21 ?1134 09/26/21 ?1427 09/26/21 ?2348 09/27/21 ?[id number] 09/27/21 ?[id number] 09/29/21 ?2330 09/30/21 ?0300  ?WBC 6.9 6.9  --   --   --   --  7.0  ?CREATININE  --   --   --  0.38*  --   --  0.40*  ?[place]  --   --   --   --  12* 10*  --   ?VANCOPEAK  --   --  55*  --   --   --  30  ? ?  ?Estimated Creatinine Clearance: 104.1 mL/min (A) (by C-G formula based on SCr of 0.4 mg/dL (L)).   ? ?Allergies  ?Allergen Reactions  ? Amoxicillin Swelling  ?  Throat closes  ? ? ?Antimicrobials this admission: ?Vanc 3/13 >> 3/15 ?Ceftriaxone 3/13 >> 3/24 ?Daptomycin 3/16 >> 3/25 ?Vanc 3/26 >> ? ?Microbiology results: ?3/12 Bcx x2: 4/4 MRSA ?3/12 Chest wound cx: MRSA ?3/14 Bcx: 4/4 MRSA ?3/22  Bcx: ngtd ?3/24 Bcx: ngtd ?3/25 lumbar epidural wound/abscess: MRSA ?3/31 fungal cx: negative ?3/31 bone cx: negative ? ? ?Jakiah Goree A. Levada Dy, PharmD, BCPS, FNKF ?Clinical Pharmacist ?[place] ?Please utilize Amion for appropriate phone number to reach the unit pharmacist ([place]) ? ? ?

## 2021-09-30 NOTE — Progress Notes (Signed)
Mobility Specialist Progress Note  ? ? 09/30/21 1645  ?Mobility  ?Activity Ambulated independently in hallway  ?Level of Assistance Standby assist, set-up cues, supervision of patient - no hands on  ?Assistive Device None  ?Distance Ambulated (ft) 220 ft ?(110+110)  ?Activity Response Tolerated well  ?$Mobility charge 1 Mobility  ? ?Pt received coming out of BR and agreeable. No complaints on walk. Took x1 seated rest break. Returned to bed with call bell in reach.   ? ?[place] ?Mobility Specialist  ?Primary: 5N M.S. Phone: [phone] ?Secondary: 6N M.S. Phone: [phone] ?  ?

## 2021-10-01 DIAGNOSIS — B9562 Methicillin resistant Staphylococcus aureus infection as the cause of diseases classified elsewhere: Secondary | ICD-10-CM | POA: Diagnosis not present

## 2021-10-01 DIAGNOSIS — R7881 Bacteremia: Secondary | ICD-10-CM | POA: Diagnosis not present

## 2021-10-01 LAB — GLUCOSE, CAPILLARY: Glucose-Capillary: 112 mg/dL — ABNORMAL HIGH (ref 70–99)

## 2021-10-01 LAB — VANCOMYCIN, TROUGH: Vancomycin Tr: 11 ug/mL — ABNORMAL LOW (ref 15–20)

## 2021-10-01 LAB — FUNGAL ORGANISM REFLEX

## 2021-10-01 LAB — FUNGUS CULTURE WITH STAIN

## 2021-10-01 LAB — VANCOMYCIN, PEAK: Vancomycin Pk: 18 ug/mL — ABNORMAL LOW (ref 30–40)

## 2021-10-01 LAB — FUNGUS CULTURE RESULT

## 2021-10-01 MED ORDER — VANCOMYCIN HCL 1500 MG/300ML IV SOLN
1500.0000 mg | Freq: Two times a day (BID) | INTRAVENOUS | Status: DC
  Administered 2021-10-01 – 2021-10-05 (×8): 1500 mg via INTRAVENOUS
  Filled 2021-10-01 (×9): qty 300

## 2021-10-01 NOTE — Progress Notes (Signed)
Physical Therapy Treatment ?Patient Details ?Name: Kimberly Booker ?MRN: [id number] ?DOB: Feb 04, 1986 ?Today's Date: 10/01/2021 ? ? ?History of Present Illness Pt is a 36 y.o. female initially admitted to [facility] 08/15/21 with R shoulder abscess; admit to [facility] 08/26/21 with MRSA bacteremia. 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 S/p L4-5 laminectomy and abscess debridement 3/25. Pt with pleural effusion s/p R pigtail chest tube 3/26. Abdominal CT showed bowel perforation; s/p ex lap with patch repair on 3/27. S/p R psoas abscess drainage on 3/30. S/p R clavicle I&D with wound vac placement 3/31. Pt developed severe LBP on 4/2; MRI showed improvement in abscess. PMH includes anxiety, depression, polysubstance abuse, R clavicle fx ~8 mo prior to admission. ? ?  ?PT Comments  ? ? Pt received supine and agreeable to session with continued progress towards goals. Pt able to ambulate throughout session without AD with intermittent use of IV pole to maintain stability with noted improvement in activity tolerance despite c/o increased pain. Noted knee instability throughout ambulation secondary to weakness, deconditioning and radiating pain. Pt continues to be motivated and with good participation despite pain and fatigue. Pt continues to benefit from skilled PT services to progress toward functional mobility goals.   ?  ?Recommendations for follow up therapy are one component of a multi-disciplinary discharge planning process, led by the attending physician.  Recommendations may be updated based on patient status, additional functional criteria and insurance authorization. ? ?Follow Up Recommendations ? Home health PT ?  ?  ?Assistance Recommended at Discharge Intermittent Supervision/Assistance  ?Patient can return home with the following A little help with bathing/dressing/bathroom;Assistance with cooking/housework;Assist for transportation ?  ?Equipment Recommendations ? Rollator (4 wheels)  ?   ?Recommendations for Other Services   ? ? ?  ?Precautions / Restrictions Precautions ?Precautions: Fall;Back;Other (comment) ?Required Braces or Orthoses: Spinal Brace ?Spinal Brace: Lumbar corset ?Restrictions ?Weight Bearing Restrictions: No ?RUE Weight Bearing: Weight bearing as tolerated  ?  ? ?Mobility ? Bed Mobility ?Overal bed mobility: Modified Independent ?  ?  ?  ?  ?  ?  ?  ?  ? ?Transfers ?Overall transfer level: Modified independent ?Equipment used: None ?  ?  ?  ?  ?  ?  ?  ?  ?  ? ?Ambulation/Gait ?Ambulation/Gait assistance: Supervision, Min guard ?Gait Distance (Feet): 212 Feet ?Assistive device: IV Pole ?Gait Pattern/deviations: Step-through pattern, Decreased stride length, Antalgic, Trunk flexed ?Gait velocity: Decreased ?  ?  ?General Gait Details: Slow, steady gait without AD using IV pole for stability, supervision for safety/lines;  pt with slow, guarded gait, B knee instability throughout secondary to weakness and pain ? ? ?Stairs ?  ?  ?  ?  ?  ? ? ?Wheelchair Mobility ?  ? ?Modified Rankin (Stroke Patients Only) ?  ? ? ?  ?Balance Overall balance assessment: Needs assistance ?Sitting-balance support: Feet supported ?Sitting balance-Leahy Scale: Good ?Sitting balance - Comments: Indep with donning LB clothes sitting EOB ?  ?Standing balance support: No upper extremity supported, During functional activity ?Standing balance-Leahy Scale: Fair ?Standing balance comment: improving dynamic standing balance, cleaning tray table, rearranging laundry and grooming in standing this session ?  ?  ?  ?  ?  ?  ?  ?  ?  ?  ?  ?  ? ?  ?Cognition Arousal/Alertness: Awake/alert ?Behavior During Therapy: [facility] for tasks assessed/performed ?Overall Cognitive Status: Within Functional Limits for tasks assessed ?  ?  ?  ?  ?  ?  ?  ?  ?  ?  ?  ?  ?  ?  ?  ?  ?  ?  ?  ? ?  ?  Exercises   ? ?  ?General Comments   ?  ?  ? ?Pertinent Vitals/Pain Pain Assessment ?Pain Assessment: Faces ?Faces Pain Scale: Hurts even  more ?Pain Location: back ?Pain Descriptors / Indicators: Discomfort ?Pain Intervention(s): Limited activity within patient's tolerance, Monitored during session  ? ? ?Home Living   ?  ?  ?  ?  ?  ?  ?  ?  ?  ?   ?  ?Prior Function    ?  ?  ?   ? ?PT Goals (current goals can now be found in the care plan section) Acute Rehab PT Goals ?PT Goal Formulation: With patient ?Time For Goal Achievement: 10/12/21 ? ?  ?Frequency ? ? ? Min 2X/week ? ? ? ?  ?PT Plan Current plan remains appropriate  ? ? ?Co-evaluation   ?  ?  ?  ?  ? ?  ?AM-PAC PT "6 Clicks" Mobility   ?Outcome Measure ? Help needed turning from your back to your side while in a flat bed without using bedrails?: None ?Help needed moving from lying on your back to sitting on the side of a flat bed without using bedrails?: None ?Help needed moving to and from a bed to a chair (including a wheelchair)?: None ?Help needed standing up from a chair using your arms (e.g., wheelchair or bedside chair)?: None ?Help needed to walk in hospital room?: A Little ?Help needed climbing 3-5 steps with a railing? : A Little ?6 Click Score: 22 ? ?  ?End of Session Equipment Utilized During Treatment: Back brace ?Activity Tolerance: Patient tolerated treatment well ?Patient left: in bed;with call bell/phone within reach ?Nurse Communication: Mobility status ?PT Visit Diagnosis: Other abnormalities of gait and mobility (R26.89);Muscle weakness (generalized) (M62.81);Pain ?Pain - Right/Left: Right ?Pain - part of body: Shoulder ?  ? ? ?Time: 5366-4403 ?PT Time Calculation (min) (ACUTE ONLY): 23 min ? ?Charges:  $Therapeutic Exercise: 23-37 mins          ?          ? ?Lenora Boys. PTA ?Acute Rehabilitation Services ?Office: [phone] ? ? ? ?Marlana Salvage Daeshaun Specht ?10/01/2021, 2:01 PM ? ?

## 2021-10-01 NOTE — Progress Notes (Signed)
Pharmacy Antibiotic Note ? ?Kimberly Booker is a 36 y.o. female admitted on 08/26/2021 with  disseminated MRSA bacteremia (diffuse discitis/osteomyelitis/epidural abscess of cervical, thoracic and lumbar spine, bilateral septic arthritis, osteomyelitis of R clavicle, R lung empyema) .  ?  ?Patient was initially started on vancomycin 3/13-3/15 along with ceftriaxone 3/13-3/24. Vancomycin was transitioned to daptomycin and given 3/16-3/25. Pt was transferred to [facility] on 08/26/21. ID has consulted pharmacy dose vancomycin for coverage of MRSA R lung empyema.  ?  ?Patient is s/p placement of IR drains into iliopsoas abscesses 3/30 and debridement of right clavicle 3/31. Scr remains stable.  ?Tentative end date: 10/29/21 per ID recs (8 wks) ? ?VP 18, Vanc trough 11 ?Calculated AUC 400 ?Due to low AUC,  will change vancomycin to 1500mg  every 12 hours to achieve an AUC of 533.  ? ?Plan: ?Change Vancomycin to 1500mg  IV every 12 hours.  ?Goal AUC 400-600 ? ?Monitor renal function and clinical signs of infection  ? ?Height: 5\' 7"  (170.2 cm) ?Weight: 77.1 kg (170 lb) ?IBW/kg (Calculated) : 61.6 ? ?Temp (24hrs), Avg:98.4 ?F (36.9 ?C), Min:97.7 ?F (36.5 ?C), Max:98.8 ?F (37.1 ?C) ? ?Recent Labs  ?Lab 09/25/21 ?1134 09/26/21 ?1427 09/26/21 ?2348 09/27/21 ?09/28/21 09/27/21 ?09/29/21 09/29/21 ?2330 09/30/21 ?0300 10/01/21 ?1635 10/01/21 ?2042  ?WBC 6.9 6.9  --   --   --   --  7.0  --   --   ?CREATININE  --   --   --  0.38*  --   --  0.40*  --   --   ?VANCOTROUGH  --   --   --   --    < > 10*  --   --  11*  ?VANCOPEAK  --   --    < >  --   --   --  30 18*  --   ? < > = values in this interval not displayed.  ?  ?Estimated Creatinine Clearance: 104.1 mL/min (A) (by C-G formula based on SCr of 0.4 mg/dL (L)).   ? ?Allergies  ?Allergen Reactions  ? Amoxicillin Swelling  ?  Throat closes  ? ? ?Antimicrobials this admission: ?Vanc 3/13 >> 3/15 ?Ceftriaxone 3/13 >> 3/24 ?Daptomycin 3/16 >> 3/25 ?Vanc 3/26 >> ? ?Microbiology results: ?3/12 Bcx  x2: 4/4 MRSA ?3/12 Chest wound cx: MRSA ?3/14 Bcx: 4/4 MRSA ?3/22 Bcx: ngtd ?3/24 Bcx: ngtd ?3/25 lumbar epidural wound/abscess: MRSA ?3/31 fungal cx: negative ?3/31 bone cx: negative ? ? ?4/25, PharmD, BCPS, BCCCP ?Clinical Pharmacist ?Please refer to [facility] for [facility] numbers ?10/01/2021, 9:56 PM ? ?

## 2021-10-01 NOTE — Progress Notes (Signed)
Notified by lab that there are pending time collect labs for patient.  No notification prior to this therefore labs are late due to this. ?

## 2021-10-01 NOTE — Progress Notes (Signed)
?Progress Note ? ?Patient: Kimberly Booker:[id number] DOB: 1986/03/06  ?DOA: 08/26/2021  DOS: 10/01/2021  ?  ?Brief hospital course: ?36 year old with past medical history significant for anxiety, depression, suicidal attempt drug overdose, polysubstance abuse, right clavicle fracture, was admitted at [facility] on 08/15/2021 due to right shoulder abscess.She was treated with I&D in the emergency department followed by IV antibiotics therapy as an inpatient.  She was diagnosed with MRSA bacteremia, endocarditis was ruled out with a TEE performed 3/16, but she continued to have positive blood cultures despite treatment with ceftriaxone and vancomycin.  Antibiotic therapy was changed to daptomycin.  She was then transferred to [facility]. ?Extensive imagings have been done here which showed right clavicle osteomyelitis/phlegmon/abscess, widespread cervical, thoracic, lumbar discitis/osteomyelitis/epidural abscess/cord impingement.  MRI also showed bilateral iliopsoas abscess, right complex pleural effusion.  ID, orthopedics, neurosurgery following. ?Underwent lumbar laminectomy/debridement of abscess, L4-L5 on 3/25. Hospital course also remarkable for finding of right-sided empyema, gastric perforation. Status post emergent laparotomy and perforation of repair by general surgery.  PCCM was also following for chest tube on the right side, which was removed on 3/29. ?Underwent aspiration of the psoas abscesses on 3/30.  ?Underwent debridement of the right clavicle and application of wound VAC to the right shoulder area on 3/31. ?Developed severe lower back pain on 4/2.  MRI was done which showed improvement in the abscess.  Symptoms have improved. ?Flexiril added to her regimen for back pain and spasms with much improvement.  ?She is working with PT and progressing well.  ? ?Assessment and Plan: ?MRSA bacteremia ?Patient has disseminated MRSA bacteremia.  ?She had TEE outside facility on 3/16, negative for  endocarditis as per the report . TTE did not show any vegetation, might need TEE here but was delayed due emergent surgery for gastric perforation and other surgical procedures as outlined.   ?- Per infectious disease patient will need a total of 8 weeks of antibiotic therapy starting from the day of surgery on 09/03/2021 with an end date on 10/29/2021. Vancomycin trough subtherapeutic, augmenting dose per pharmacy. SCr stable, wnl. Blood cultures cleared as of the draw on 4/9.  ?  ?Gastric perforation (HCC) ?Complained of abdominal pain on 3/26.  Chest x-ray done after chest tube placement showed incidental finding of pneumoperitoneum.  CT abdomen/pelvis confirmed pneumoperitoneum.  General surgery consulted and she underwent emergent exploratory laparatomy with finding of gastric perforation.  ?- S/p surgical repair and patient is currently on soft diet and able to tolerate. We have advanced diet to soft diet.  ?  ?Abscess in epidural space of cervical spine ?Discitis/osteomyelitis at C2-3, C5-6, and C6-7. Ventral epidural abscess at C2-3, C5, and C6 with cord impingement especially at the lower 2 levels. Facet arthritis on the left at C7-T1 and right at C3-4. ?-Also showed discitis/osteomyelitis of T9- T10, T11-T12 ?-Was seen by neurosurgery, no focal deficits at this time. ?Continue with pain management. ?Patient able to ambulate and participate in therapy at this time. ?  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 Bilateral facet arthritis at L5-S1. Dorsal epidural abscess at L4 and L5,highly compressive on the thecal sac. ?S/P  lumbar laminectomy, debridement of abscess, L4-L5 on 3/25. ?No further interventions planned by neurosurgery.   ?On 4/2 patient was complaining of severe pain in the lower back radiating down to her legs.  MRI lumbar spine was ordered. ?It redemonstrated the osteomyelitis at T11-12 and L5-S1.  Noted to be  status post L4-L5 laminectomy and debridement with significant decrease in the amount of the abscess.  A small collection was noted posterior to the S1 causing moderate thecal sac narrowing.  Other findings of iliopsoas abscess and sacroiliac septic arthritis were noted as seen previously on MRI pelvis. ?These findings were discussed with neurosurgery.  Patient does not have any focal neurological deficits.  No intervention was recommended.  Patient able to ambulate and no focal deficits seen. ?Continue with therapy and encourage ambulation as tolerated. Pt refusing lovenox.  ?Wound care consulted for dressing change.  ?  ?Iliopsoas abscess (HCC) ?Sacroiliac septic arthritis/osteomyelitis. Pelvic MRI showed bilateral infectious sacroiliitis and osteomyelitis of the sacrum and periarticular right iliac bone. Bilateral psoas abscesses, Small bilateral paraspinal muscle abscesses, intramuscular abscess within the left piriformis muscle, small intramuscular abscess within the left gluteus medius muscle. ?  ?Pleural effusion ?Chest CT showed a large loculated right pleural effusion concerning for empyema, subcentimeter groundglass nodularity within the bilateral lungs concerning for septic emboli.  ?S/P chest tube placement.  Fluid noted to be exudative.  Cultures without any growth.  No need for DNase/tPA.   ?Chest x-ray showed resolution of the effusion.  Chest tube was discontinued on 3/29. Stable on room air. ?  ?Normocytic anemia ?S/p 2 units of PRBC transfusion at an outside facility.  Last hemoglobin on 09/15/2021 around 8.5, currently stable around 7.9. no signs of bleeding.  ?- Recheck weekly.  ?  ?Anxiety with depression ?- Noted to be on Prozac and Ativan as needed. ?  ?Acute osteomyelitis of clavicle, right: s/p debridement of right clavicle and application of wound VAC on 3/31. Wound care consulted for dressing change. ?- Continue routine wound care.  ?  ?Polysubstance abuse (HCC): Reports last use was 2  years ago. ?  ?Acute pain: Due to multifocal infections.  ?- Continue fentanyl patch and prn percocet. Will plan to taper gradually as she would be best served by coming off opioids at discharge.   ?  ?Transaminitis: Resolved. +HepCAb with undetectable quant.  ?  ?Vaginal discharge: Resolved s/p diflucan  ?  ?Hypotension;  ?Resolved with fluid bolus.  ? ?Subjective: Feels today is a good day. Pain controlled, walked around more yesterday. No new issues.  ? ?Objective: ?Vitals:  ? 09/30/21 1919 10/01/21 0500 10/01/21 0738 10/01/21 1312  ?BP: 100/64 106/73 (!) 97/58 103/63  ?Pulse: 84 80 88 73  ?Resp: 17 17  17   ?Temp: 98.4 ?F (36.9 ?C) 97.7 ?F (36.5 ?C) 98.8 ?F (37.1 ?C) 98.8 ?F (37.1 ?C)  ?TempSrc:  Oral Oral Oral  ?SpO2: 97% 99% 97% 97%  ?Weight:      ?Height:      ? ?No distress ?Clear, nonlabored ?Regular ? ?Family Communication: None at bedside ? ?Disposition: ?Status is: Inpatient ?Remains inpatient appropriate because: Needs IV antibiotics with end date 10/29/2021 ?Planned Discharge Destination: Home ? ?10/31/2021, MD ?10/01/2021 4:05 PM ?Page by [email or web address]  ?

## 2021-10-02 DIAGNOSIS — B9562 Methicillin resistant Staphylococcus aureus infection as the cause of diseases classified elsewhere: Secondary | ICD-10-CM | POA: Diagnosis not present

## 2021-10-02 DIAGNOSIS — R7881 Bacteremia: Secondary | ICD-10-CM | POA: Diagnosis not present

## 2021-10-02 NOTE — Progress Notes (Signed)
?Progress Note ? ?Patient: Kimberly Booker [id number] DOB: [birth year]  ?DOA: 08/26/2021  DOS: 10/02/2021  ?  ?Brief hospital course: ?36 year old with past medical history significant for anxiety, depression, suicidal attempt drug overdose, polysubstance abuse, right clavicle fracture, was admitted at [facility] on 08/15/2021 due to right shoulder abscess.She was treated with I&D in the emergency department followed by IV antibiotics therapy as an inpatient.  She was diagnosed with MRSA bacteremia, endocarditis was ruled out with a TEE performed 3/16, but she continued to have positive blood cultures despite treatment with ceftriaxone and vancomycin.  Antibiotic therapy was changed to daptomycin.  She was then transferred to [facility]. ?Extensive imagings have been done here which showed right clavicle osteomyelitis/phlegmon/abscess, widespread cervical, thoracic, lumbar discitis/osteomyelitis/epidural abscess/cord impingement.  MRI also showed bilateral iliopsoas abscess, right complex pleural effusion.  ID, orthopedics, neurosurgery following. ?Underwent lumbar laminectomy/debridement of abscess, L4-L5 on 3/25. Hospital course also remarkable for finding of right-sided empyema, gastric perforation. Status post emergent laparotomy and perforation of repair by general surgery.  PCCM was also following for chest tube on the right side, which was removed on 3/29. ?Underwent aspiration of the psoas abscesses on 3/30.  ?Underwent debridement of the right clavicle and application of wound VAC to the right shoulder area on 3/31. ?Developed severe lower back pain on 4/2.  MRI was done which showed improvement in the abscess.  Symptoms have improved. ?Flexiril added to her regimen for back pain and spasms with much improvement.  ?She is working with PT and progressing well.  ? ?Assessment and Plan: ?MRSA bacteremia ?Patient has disseminated MRSA bacteremia.  ?She had TEE outside facility on 3/16, negative for  endocarditis as per the report . TTE did not show any vegetation, might need TEE here but was delayed due emergent surgery for gastric perforation and other surgical procedures as outlined.   ?- Per infectious disease patient will need a total of 8 weeks of antibiotic therapy starting from the day of surgery on 09/03/2021 with an end date on 10/29/2021. Vancomycin trough subtherapeutic, augmenting dose per pharmacy. SCr stable, wnl. Blood cultures cleared as of the draw on 4/9.  ?  ?Gastric perforation ([place]) ?Complained of abdominal pain on 3/26.  Chest x-ray done after chest tube placement showed incidental finding of pneumoperitoneum.  CT abdomen/pelvis confirmed pneumoperitoneum.  General surgery consulted and she underwent emergent exploratory laparatomy with finding of gastric perforation.  ?- S/p surgical repair and patient is currently on soft diet and able to tolerate. We have advanced diet to soft diet.  ?  ?Abscess in epidural space of cervical spine ?Discitis/osteomyelitis at C2-3, C5-6, and C6-7. Ventral epidural abscess at C2-3, C5, and C6 with cord impingement especially at the lower 2 levels. Facet arthritis on the left at C7-T1 and right at C3-4. ?-Also showed discitis/osteomyelitis of T9- T10, T11-T12 ?-Was seen by neurosurgery, no focal deficits at this time. ?Continue with pain management. ?Patient able to ambulate and participate in therapy at this time. ?  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 Bilateral facet arthritis at L5-S1. Dorsal epidural abscess at L4 and L5,highly compressive on the thecal sac. ?S/P  lumbar laminectomy, debridement of abscess, L4-L5 on 3/25. ?No further interventions planned by neurosurgery.   ?On 4/2 patient was complaining of severe pain in the lower back radiating down to her legs.  MRI lumbar spine was ordered. ?It redemonstrated the osteomyelitis at T11-12 and L5-S1.  Noted to be  status post L4-L5 laminectomy and debridement with significant decrease in the amount of the abscess.  A small collection was noted posterior to the S1 causing moderate thecal sac narrowing.  Other findings of iliopsoas abscess and sacroiliac septic arthritis were noted as seen previously on MRI pelvis. ?These findings were discussed with neurosurgery.  Patient does not have any focal neurological deficits.  No intervention was recommended.  Patient able to ambulate and no focal deficits seen. ?Continue with therapy and encourage ambulation as tolerated. Pt refusing lovenox.  ?Wound care consulted for dressing change.  ?- Physical exam reassuring at this time.  ?  ?Iliopsoas abscess ([place]) ?Sacroiliac septic arthritis/osteomyelitis. Pelvic MRI showed bilateral infectious sacroiliitis and osteomyelitis of the sacrum and periarticular right iliac bone. Bilateral psoas abscesses, Small bilateral paraspinal muscle abscesses, intramuscular abscess within the left piriformis muscle, small intramuscular abscess within the left gluteus medius muscle. ?  ?Pleural effusion ?Chest CT showed a large loculated right pleural effusion concerning for empyema, subcentimeter groundglass nodularity within the bilateral lungs concerning for septic emboli.  ?S/P chest tube placement.  Fluid noted to be exudative.  Cultures without any growth.  No need for DNase/tPA.   ?Chest x-ray showed resolution of the effusion.  Chest tube was discontinued on 3/29. Stable on room air. ?  ?Normocytic anemia ?S/p 2 units of PRBC transfusion at an outside facility.  Last hemoglobin on 09/15/2021 around 8.5, currently stable around 7.9. no signs of bleeding.  ?- Recheck weekly.  ?  ?Anxiety with depression ?- Noted to be on Prozac and Ativan as needed. ?  ?Acute osteomyelitis of clavicle, right: s/p debridement of right clavicle and application of wound VAC on 3/31. Wound care consulted for dressing change. ?- Continue routine wound care.  ?   ?Polysubstance abuse ([place]): Reports last use was 2 years ago. ?- Seems to have firm understanding of the disease of addiction and will pursue community treatment options to maintain recovery. ?  ?Acute pain: Due to multifocal infections.  ?- Continue fentanyl patch and prn percocet. Will plan to taper gradually as she would be best served by coming off opioids at discharge. There is certainly a possibility of chronic pain from these infections and pt is appropriately concerned about her disease of addiction being relapsed if she's discharged with inadequate control, wondering about methadone. We will consider this, but no changes to be made currently. ?  ?Transaminitis: Resolved. +HepCAb with undetectable quant.  ?  ?Vaginal discharge: Resolved s/p diflucan  ?  ?Hypotension;  ?Resolved with fluid bolus.  ? ?Subjective: Pain better controlled, stable. Inquiring into methadone Tx postdischarge. Feels lumps in her back. Wants to go ahead and change to portable wound vac.  ? ?Objective: ?Vitals:  ? 10/01/21 1312 10/01/21 2007 10/02/21 0448 10/02/21 0802  ?BP: 103/63 105/62 110/62 (!) 95/57  ?Pulse: 73 86 88 84  ?Resp: 17 18 17 20   ?Temp: 98.8 ?F (37.1 ?C) 98.3 ?F (36.8 ?C) 98.3 ?F (36.8 ?C) 98.6 ?F (37 ?C)  ?TempSrc: Oral   Oral  ?SpO2: 97% 91% 94% 96%  ?Weight:      ?Height:      ? ?No distress ?Poor dentition ?Clear, nonlabored ?RUE with improving ROM, shoulder site c/d/I ?Back without palpable deformities in midline. Lower incision is healed with palpable tender scarring. No fluctuance of step offs. ? ?Family Communication: None at bedside ? ?Disposition: ?Status is: Inpatient ?Remains inpatient appropriate because: Needs IV antibiotics with end date 10/29/2021 ?Planned Discharge Destination: Home ? ?Meredith Leeds  Bonner Puna, MD ?10/02/2021 9:18 AM ?Page by [email or web address]  ?

## 2021-10-02 NOTE — Progress Notes (Signed)
Mobility Specialist Progress Note  ? ? 10/02/21 1506  ?Mobility  ?Activity Ambulated with assistance in hallway  ?Level of Assistance Standby assist, set-up cues, supervision of patient - no hands on  ?Assistive Device Front wheel walker  ?Distance Ambulated (ft) 220 ft  ?Activity Response Tolerated well  ?$Mobility charge 1 Mobility  ? ?Pt received in bed and agreeable. No complaints on walk. Returned to bed with call bell in reach.   ? ? [place] ?Mobility Specialist  ?Primary: [address] Phone: [phone] ?Secondary: [address] Phone: [phone] ?  ?

## 2021-10-03 DIAGNOSIS — R7881 Bacteremia: Secondary | ICD-10-CM | POA: Diagnosis not present

## 2021-10-03 DIAGNOSIS — B9562 Methicillin resistant Staphylococcus aureus infection as the cause of diseases classified elsewhere: Secondary | ICD-10-CM | POA: Diagnosis not present

## 2021-10-03 NOTE — Progress Notes (Signed)
Mobility Specialist Progress Note  ? ? 10/03/21 1405  ?Mobility  ?Activity Ambulated independently in hallway  ?Level of Assistance Standby assist, set-up cues, supervision of patient - no hands on  ?Assistive Device None  ?Distance Ambulated (ft) 220 ft  ?Activity Response Tolerated well  ?$Mobility charge 1 Mobility  ? ?Pt received in bed and agreeable. No complaints on walk. Took x1 seated rest break. Returned to bed with call bell in reach.   ? ?[place] ?Mobility Specialist  ?Primary: 5N M.S. Phone: [phone] ?Secondary: 6N M.S. Phone: [phone] ?  ?

## 2021-10-03 NOTE — Progress Notes (Signed)
?Progress Note ? ?Patient: Kimberly Booker [id number] DOB: 17-Jan-1986  ?DOA: 08/26/2021  DOS: 10/03/2021  ?  ?Brief hospital course: ?36 year old with past medical history significant for anxiety, depression, suicidal attempt drug overdose, polysubstance abuse, right clavicle fracture, was admitted at [facility] on 08/15/2021 due to right shoulder abscess.She was treated with I&D in the emergency department followed by IV antibiotics therapy as an inpatient.  She was diagnosed with MRSA bacteremia, endocarditis was ruled out with a TEE performed 3/16, but she continued to have positive blood cultures despite treatment with ceftriaxone and vancomycin.  Antibiotic therapy was changed to daptomycin.  She was then transferred to [facility]. ?Extensive imagings have been done here which showed right clavicle osteomyelitis/phlegmon/abscess, widespread cervical, thoracic, lumbar discitis/osteomyelitis/epidural abscess/cord impingement.  MRI also showed bilateral iliopsoas abscess, right complex pleural effusion.  ID, orthopedics, neurosurgery following. ?Underwent lumbar laminectomy/debridement of abscess, L4-L5 on 3/25. Hospital course also remarkable for finding of right-sided empyema, gastric perforation. Status post emergent laparotomy and perforation of repair by general surgery.  PCCM was also following for chest tube on the right side, which was removed on 3/29. ?Underwent aspiration of the psoas abscesses on 3/30.  ?Underwent debridement of the right clavicle and application of wound VAC to the right shoulder area on 3/31. ?Developed severe lower back pain on 4/2.  MRI was done which showed improvement in the abscess.  Symptoms have improved. ?Flexiril added to her regimen for back pain and spasms with much improvement.  ?She is working with PT and progressing well.  ? ?Assessment and Plan: ?MRSA bacteremia ?Patient has disseminated MRSA bacteremia.  ?She had TEE outside facility on 3/16, negative for  endocarditis as per the report . TTE did not show any vegetation, might need TEE here but was delayed due emergent surgery for gastric perforation and other surgical procedures as outlined.   ?- Per infectious disease patient will need a total of 8 weeks of antibiotic therapy starting from the day of surgery on 09/03/2021 with an end date on 10/29/2021. Vancomycin trough subtherapeutic, augmenting dose per pharmacy. SCr stable, wnl. Blood cultures cleared as of the draw on 4/9.  ?  ?Gastric perforation ([place]) ?Complained of abdominal pain on 3/26.  Chest x-ray done after chest tube placement showed incidental finding of pneumoperitoneum.  CT abdomen/pelvis confirmed pneumoperitoneum.  General surgery consulted and she underwent emergent exploratory laparatomy with finding of gastric perforation.  ?- S/p surgical repair and patient is currently on soft diet and able to tolerate. We have advanced diet to soft diet.  ?  ?Abscess in epidural space of cervical spine ?Discitis/osteomyelitis at C2-3, C5-6, and C6-7. Ventral epidural abscess at C2-3, C5, and C6 with cord impingement especially at the lower 2 levels. Facet arthritis on the left at C7-T1 and right at C3-4. ?-Also showed discitis/osteomyelitis of T9- T10, T11-T12 ?-Was seen by neurosurgery, no focal deficits at this time. ?Continue with pain management. ?Patient able to ambulate and participate in therapy at this time. ?  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 Bilateral facet arthritis at L5-S1. Dorsal epidural abscess at L4 and L5,highly compressive on the thecal sac. ?S/P  lumbar laminectomy, debridement of abscess, L4-L5 on 3/25. ?No further interventions planned by neurosurgery.   ?On 4/2 patient was complaining of severe pain in the lower back radiating down to her legs.  MRI lumbar spine was ordered. ?It redemonstrated the osteomyelitis at T11-12 and L5-S1.  Noted to be  status post L4-L5 laminectomy and debridement with significant decrease in the amount of the abscess.  A small collection was noted posterior to the S1 causing moderate thecal sac narrowing.  Other findings of iliopsoas abscess and sacroiliac septic arthritis were noted as seen previously on MRI pelvis. ?These findings were discussed with neurosurgery.  Patient does not have any focal neurological deficits.  No intervention was recommended.  Patient able to ambulate and no focal deficits seen. ?Continue with therapy and encourage ambulation as tolerated. Pt refusing lovenox.  ?Wound care consulted for dressing change.  ?- Physical exam reassuring at this time.  ?  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 Pelvic MRI showed bilateral infectious sacroiliitis and osteomyelitis of the sacrum and periarticular right iliac bone. Bilateral psoas abscesses, Small bilateral paraspinal muscle abscesses, intramuscular abscess within the left piriformis muscle, small intramuscular abscess within the left gluteus medius muscle. ?  ?Pleural effusion ?Chest CT showed a large loculated right pleural effusion concerning for empyema, subcentimeter groundglass nodularity within the bilateral lungs concerning for septic emboli.  ?S/P chest tube placement.  Fluid noted to be exudative.  Cultures without any growth.  No need for DNase/tPA.   ?Chest x-ray showed resolution of the effusion.  Chest tube was discontinued on 3/29. Stable on room air. ?  ?Normocytic anemia ?S/p 2 units of PRBC transfusion at an outside facility.  Last hemoglobin on 09/15/2021 around 8.5, currently stable around 7.9. no signs of bleeding.  ?- Recheck weekly.  ?  ?Anxiety with depression ?- Noted to be on Prozac and Ativan as needed. ?  ?Acute osteomyelitis of clavicle, right: s/p debridement of right clavicle and application of wound VAC on 3/31. Wound care consulted for dressing change. ?- Continue routine wound care.  ?   ?Polysubstance abuse ([place]): Reports last use was 2 years ago. ?- Seems to have firm understanding of the disease of addiction and will pursue community treatment options to maintain recovery. ?  ?Acute pain: Due to multifocal infections.  ?- Continue fentanyl patch and prn percocet. Will plan to taper gradually as she would be best served by coming off opioids at discharge. There is certainly a possibility of chronic pain from these infections and pt is appropriately concerned about her disease of addiction being relapsed if she's discharged with inadequate control, wondering about methadone. We will consider this, but no changes to be made currently. ?  ?Transaminitis: Resolved. +HepCAb with undetectable quant.  ?  ?Vaginal discharge: Resolved s/p diflucan  ?  ?Hypotension;  ?Resolved with fluid bolus.  ? ?Subjective: No new events, pain so far is fine today. ? ?Objective: ?Vitals:  ? 10/02/21 0448 10/02/21 0802 10/02/21 1531 10/02/21 2223  ?BP: 110/62 (!) 95/57 (!) 94/56 94/60  ?Pulse: 88 84 87 81  ?Resp: 17 20 18    ?Temp: 98.3 ?F (36.8 ?C) 98.6 ?F (37 ?C) 98.7 ?F (37.1 ?C) 98.1 ?F (36.7 ?C)  ?TempSrc:  Oral Oral Oral  ?SpO2: 94% 96% 97% 94%  ?Weight:      ?Height:      ? ?No distress, nonlabored, PICC site appears normal.   ? ?Family Communication: None at bedside ? ?Disposition: ?Status is: Inpatient ?Remains inpatient appropriate because: Needs IV antibiotics with end date 10/29/2021 ?Planned Discharge Destination: Home ? ?Patrecia Pour, MD ?10/03/2021 1:02 PM ?Page by [email or web address]  ?

## 2021-10-04 DIAGNOSIS — B9562 Methicillin resistant Staphylococcus aureus infection as the cause of diseases classified elsewhere: Secondary | ICD-10-CM | POA: Diagnosis not present

## 2021-10-04 DIAGNOSIS — R7881 Bacteremia: Secondary | ICD-10-CM | POA: Diagnosis not present

## 2021-10-04 LAB — BASIC METABOLIC PANEL
Anion gap: 9 (ref 5–15)
BUN: 8 mg/dL (ref 6–20)
CO2: 25 mmol/L (ref 22–32)
Calcium: 8.5 mg/dL — ABNORMAL LOW (ref 8.9–10.3)
Chloride: 103 mmol/L (ref 98–111)
Creatinine, Ser: 0.39 mg/dL — ABNORMAL LOW (ref 0.44–1.00)
GFR, Estimated: >60 mL/min (ref >60)
Glucose, Bld: 100 mg/dL — ABNORMAL HIGH (ref 70–99)
Potassium: 3.8 mmol/L (ref 3.5–5.1)
Sodium: 137 mmol/L (ref 135–145)

## 2021-10-04 NOTE — Progress Notes (Signed)
Talked with Doc. Lajoyce Corners he state that he will come remove the rest of vac sponge.  ?

## 2021-10-04 NOTE — Progress Notes (Signed)
?Progress Note ? ?Patient: Kimberly Booker MLY:[id number] DOB: 12/26/1985  ?DOA: 08/26/2021  DOS: 10/04/2021  ?  ?Brief hospital course: ?36 year old with past medical history significant for anxiety, depression, suicidal attempt drug overdose, polysubstance abuse, right clavicle fracture, was admitted at [facility] on 08/15/2021 due to right shoulder abscess.She was treated with I&D in the emergency department followed by IV antibiotics therapy as an inpatient.  She was diagnosed with MRSA bacteremia, endocarditis was ruled out with a TEE performed 3/16, but she continued to have positive blood cultures despite treatment with ceftriaxone and vancomycin.  Antibiotic therapy was changed to daptomycin.  She was then transferred to [facility]. ?Extensive imagings have been done here which showed right clavicle osteomyelitis/phlegmon/abscess, widespread cervical, thoracic, lumbar discitis/osteomyelitis/epidural abscess/cord impingement.  MRI also showed bilateral iliopsoas abscess, right complex pleural effusion.  ID, orthopedics, neurosurgery following. ?Underwent lumbar laminectomy/debridement of abscess, L4-L5 on 3/25. Hospital course also remarkable for finding of right-sided empyema, gastric perforation. Status post emergent laparotomy and perforation of repair by general surgery.  PCCM was also following for chest tube on the right side, which was removed on 3/29. ?Underwent aspiration of the psoas abscesses on 3/30.  ?Underwent debridement of the right clavicle and application of wound VAC to the right shoulder area on 3/31. ?Developed severe lower back pain on 4/2.  MRI was done which showed improvement in the abscess.  Symptoms have improved. ?Flexiril added to her regimen for back pain and spasms with much improvement.  ?She is working with PT and progressing well.  ? ?Assessment and Plan: ?MRSA bacteremia ?Patient has disseminated MRSA bacteremia.  ?She had TEE outside facility on 3/16, negative for  endocarditis as per the report . TTE did not show any vegetation, might need TEE here but was delayed due emergent surgery for gastric perforation and other surgical procedures as outlined.   ?- Per infectious disease patient will need a total of 8 weeks of antibiotic therapy starting from the day of surgery on 09/03/2021 with an end date on 10/29/2021. Vancomycin continued. Cr stable, wnl. Blood cultures cleared as of the draw on 4/9.  ?  ?Gastric perforation ([facility]) ?Complained of abdominal pain on 3/26.  Chest x-ray done after chest tube placement showed incidental finding of pneumoperitoneum.  CT abdomen/pelvis confirmed pneumoperitoneum.  General surgery consulted and she underwent emergent exploratory laparatomy with finding of gastric perforation.  ?- S/p surgical repair and patient is currently on soft diet and able to tolerate. We have advanced diet to soft diet.  ?  ?Abscess in epidural space of cervical spine ?Discitis/osteomyelitis at C2-3, C5-6, and C6-7. Ventral epidural abscess at C2-3, C5, and C6 with cord impingement especially at the lower 2 levels. Facet arthritis on the left at C7-T1 and right at C3-4. ?-Also showed discitis/osteomyelitis of T9- T10, T11-T12 ?-Was seen by neurosurgery, no focal deficits at this time. ?Continue with pain management. ?Patient able to ambulate and participate in therapy at this time. ?  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 Bilateral facet arthritis at L5-S1. Dorsal epidural abscess at L4 and L5,highly compressive on the thecal sac. ?S/P  lumbar laminectomy, debridement of abscess, L4-L5 on 3/25. ?No further interventions planned by neurosurgery.   ?On 4/2 patient was complaining of severe pain in the lower back radiating down to her legs.  MRI lumbar spine was ordered. ?It redemonstrated the osteomyelitis at T11-12 and L5-S1.  Noted to be status post L4-L5 laminectomy and  debridement  with significant decrease in the amount of the abscess.  A small collection was noted posterior to the S1 causing moderate thecal sac narrowing.  Other findings of iliopsoas abscess and sacroiliac septic arthritis were noted as seen previously on MRI pelvis. ?These findings were discussed with neurosurgery.  Patient does not have any focal neurological deficits.  No intervention was recommended.  Patient able to ambulate and no focal deficits seen. ?Continue with therapy and encourage ambulation as tolerated. Pt refusing lovenox.  ?Wound care consulted for dressing change.  ?- Physical exam reassuring at this time.  ?  ?Iliopsoas abscess ([facility]) ?Sacroiliac septic arthritis/osteomyelitis. Pelvic MRI showed bilateral infectious sacroiliitis and osteomyelitis of the sacrum and periarticular right iliac bone. Bilateral psoas abscesses, Small bilateral paraspinal muscle abscesses, intramuscular abscess within the left piriformis muscle, small intramuscular abscess within the left gluteus medius muscle. ?  ?Pleural effusion ?Chest CT showed a large loculated right pleural effusion concerning for empyema, subcentimeter groundglass nodularity within the bilateral lungs concerning for septic emboli.  ?S/P chest tube placement.  Fluid noted to be exudative.  Cultures without any growth.  No need for DNase/tPA.   ?Chest x-ray showed resolution of the effusion.  Chest tube was discontinued on 3/29. Stable on room air. ?  ?Normocytic anemia ?S/p 2 units of PRBC transfusion at an outside facility.  Last hemoglobin on 09/15/2021 around 8.5, currently stable around 7.9. no signs of bleeding.  ?- Recheck weekly.  ?  ?Anxiety with depression ?- Noted to be on Prozac and Ativan as needed. ?  ?Acute osteomyelitis of clavicle, right: s/p debridement of right clavicle and application of wound VAC on 3/31. Wound care consulted for dressing change. ?- Continue routine wound care. Asked ortho to reevaluate, could likely DC wound vac.  ?   ?Polysubstance abuse ([facility]): Reports last use was 2 years ago. ?- Seems to have firm understanding of the disease of addiction and will pursue community treatment options to maintain recovery. ?  ?Acute pain: Due to multifocal infections.  ?- Continue fentanyl patch and prn percocet. Will plan to taper gradually as she would be best served by coming off opioids at discharge. There is certainly a possibility of chronic pain from these infections and pt is appropriately concerned about her disease of addiction being relapsed if she's discharged with inadequate control, wondering about methadone. We will consider this, but no changes to be made currently. ?  ?Transaminitis: Resolved. +HepCAb with undetectable quant.  ?  ?Vaginal discharge: Resolved s/p diflucan  ?  ?Hypotension;  ?Resolved with fluid bolus.  ? ?Subjective: Pain controlled. ? ?Objective: ?Vitals:  ? 10/03/21 2032 10/04/21 0234 10/04/21 0831 10/04/21 1519  ?BP: 96/68 95/62 (!) 91/58 99/61  ?Pulse: 91 91 77 77  ?Resp: 18 16 17 14   ?Temp: 98.8 ?F (37.1 ?C) 98.2 ?F (36.8 ?C) 98 ?F (36.7 ?C) 98.4 ?F (36.9 ?C)  ?TempSrc: Oral Oral Oral Oral  ?SpO2: 95% 96% 98% 99%  ?Weight:      ?Height:      ? ?R shoulder wound vac site has not been changed, needs changed. ?Nonlabored ? ?Family Communication: None at bedside ? ?Disposition: ?Status is: Inpatient ?Remains inpatient appropriate because: Needs IV antibiotics with end date 10/29/2021 ?Planned Discharge Destination: Home ? ?10/31/2021, MD ?10/04/2021 6:32 PM ?Page by [email or web address]  ?

## 2021-10-04 NOTE — Progress Notes (Signed)
Mobility Specialist Progress Note: ? ? 10/04/21 1543  ?Mobility  ?Activity Ambulated with assistance in hallway  ?Level of Assistance Standby assist, set-up cues, supervision of patient - no hands on  ?Assistive Device None  ?Distance Ambulated (ft) 300 ft ?(150+150)  ?Activity Response Tolerated well  ?$Mobility charge 1 Mobility  ? ?Pt received in bed willing to participate in mobility. Complaints of 8/10 pain. Left in bed with call bell in reach and all needs met. ? ?Arlyn Bumpus ?Mobility Specialist ?Primary Phone [phone] ? ?

## 2021-10-04 NOTE — Progress Notes (Addendum)
Attempted to remove wound vac as order, having hard time with removal, sponge is attached to skin consulted WOC nurse and Doc Lajoyce Corners no new orders at this time. Cont. to soak with NS to losing sponge ?

## 2021-10-05 DIAGNOSIS — R7881 Bacteremia: Secondary | ICD-10-CM | POA: Diagnosis not present

## 2021-10-05 DIAGNOSIS — B9562 Methicillin resistant Staphylococcus aureus infection as the cause of diseases classified elsewhere: Secondary | ICD-10-CM | POA: Diagnosis not present

## 2021-10-05 LAB — VANCOMYCIN, PEAK: Vancomycin Pk: 37 ug/mL (ref 30–40)

## 2021-10-05 MED ORDER — VANCOMYCIN HCL 1500 MG/300ML IV SOLN
1500.0000 mg | Freq: Two times a day (BID) | INTRAVENOUS | Status: DC
  Administered 2021-10-06 (×2): 1500 mg via INTRAVENOUS
  Filled 2021-10-05 (×3): qty 300

## 2021-10-05 MED ORDER — HYDROMORPHONE HCL 1 MG/ML IJ SOLN
1.0000 mg | Freq: Once | INTRAMUSCULAR | Status: AC
  Administered 2021-10-05: 1 mg via INTRAVENOUS
  Filled 2021-10-05: qty 1

## 2021-10-05 MED ORDER — VANCOMYCIN VARIABLE DOSE PER UNSTABLE RENAL FUNCTION (PHARMACIST DOSING): Status: DC

## 2021-10-05 NOTE — Progress Notes (Signed)
OT Cancellation Note ? ?Patient Details ?Name: Kimberly Booker ?MRN: [id number] ?DOB: [birth year] ? ? ?Cancelled Treatment:    Reason Eval/Treat Not Completed: Other (comment) (pt in pain from wound vac removal, requesting therapy follow up later. Will reattempt as schedule allows) ? ?Alfonzo Beers, OTD, OTR/L ?Acute Rehab ?[phone] ? ?[name] ?10/05/2021, 10:56 AM ?

## 2021-10-05 NOTE — Progress Notes (Signed)
Physical Therapy Treatment ?Patient Details ?Name: Kimberly Booker ?MRN: [id number] ?DOB: 07-Nov-1985 ?Today's Date: 10/05/2021 ? ? ?History of Present Illness Pt is a 36 y.o. female initially admitted to [facility] 08/15/21 with R shoulder abscess; admit to [facility] 08/26/21 with MRSA bacteremia. 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 S/p L4-5 laminectomy and abscess debridement 3/25. Pt with pleural effusion s/p R pigtail chest tube 3/26. Abdominal CT showed bowel perforation; s/p ex lap with patch repair on 3/27. S/p R psoas abscess drainage on 3/30. S/p R clavicle I&D with wound vac placement 3/31. Pt developed severe LBP on 4/2; MRI showed improvement in abscess. R clavicle wound vac removed 5/2. PMH includes anxiety, depression, polysubstance abuse, R clavicle fx ~8 mo prior to admission. ?  ?PT Comments  ? ? Pt progressing with mobility, notes increased R clavicle pain after wound vac removal this AM. Pt ambulatory without DME; today's session focused on education regarding LE HEP and importance of performing this outside of therapy/mobility sessions. Will continue to follow acutely to address established goals. ?   ?Recommendations for follow up therapy are one component of a multi-disciplinary discharge planning process, led by the attending physician.  Recommendations may be updated based on patient status, additional functional criteria and insurance authorization. ? ?Follow Up Recommendations ? Home health PT ?  ?  ?Assistance Recommended at Discharge Intermittent Supervision/Assistance  ?Patient can return home with the following A little help with bathing/dressing/bathroom;Assistance with cooking/housework;Assist for transportation ?  ?Equipment Recommendations ? None recommended by PT  ?  ?Recommendations for Other Services   ? ? ?  ?Precautions / Restrictions Precautions ?Precautions: Fall;Back;Other (comment) ?Precaution Comments: abdominal precautions for comfort; lumbar corset for  comfort (pt declines use) ?Required Braces or Orthoses: Spinal Brace ?Restrictions ?Weight Bearing Restrictions: No  ?  ? ?Mobility ? Bed Mobility ?Overal bed mobility: Modified Independent ?  ?  ?  ?  ?  ?  ?  ?  ? ?Transfers ?Overall transfer level: Modified independent ?Equipment used: None ?Transfers: Sit to/from Stand ?  ?  ?  ?  ?  ?  ?General transfer comment: reliant on momentum and UE support pushing on knees to stand ?  ? ?Ambulation/Gait ?Ambulation/Gait assistance: Supervision ?Gait Distance (Feet): 220 Feet ?Assistive device: None, IV Pole ?Gait Pattern/deviations: Step-through pattern, Decreased stride length, Antalgic, Trunk flexed ?Gait velocity: Decreased ?  ?  ?General Gait Details: slow, antalgic gait without DME, guarded with shortened step length, pt reports difficulty with R hip internal rotation secondary to pain ("my foot keeps wanting to point out"); use of IV pole for added stability the last ~80' ? ? ?Stairs ?  ?  ?  ?  ?  ? ? ?Wheelchair Mobility ?  ? ?Modified Rankin (Stroke Patients Only) ?  ? ? ?  ?Balance Overall balance assessment: Needs assistance ?Sitting-balance support: Feet supported ?Sitting balance-Leahy Scale: Good ?  ?  ?Standing balance support: No upper extremity supported, During functional activity ?Standing balance-Leahy Scale: Fair ?  ?  ?  ?  ?  ?  ?  ?  ?  ?  ?  ?  ?  ? ?  ?Cognition Arousal/Alertness: Awake/alert ?Behavior During Therapy: [facility] for tasks assessed/performed ?Overall Cognitive Status: Within Functional Limits for tasks assessed ?  ?  ?  ?  ?  ?  ?  ?  ?  ?  ?  ?  ?  ?  ?  ?  ?  ?  ?  ? ?  ?  Exercises Other Exercises ?Other Exercises: [employer] HEP handout updated (Access Code [email or web address]) - SLR, adductor squeeze, supine bridge, repeated sit<>stand without UE support, standing hip ext, standing calf raises (standing therex w/ UE support for stability) ? ?  ?General Comments General comments (skin integrity, edema, etc.): educ on importance of therex outside  of ambulating with PT/mobility specialists (updated HEP handout provided) ?  ?  ? ?Pertinent Vitals/Pain Pain Assessment ?Pain Assessment: Faces ?Faces Pain Scale: Hurts even more ?Pain Location: R-side clavicle (wound vac removed this AM) ?Pain Descriptors / Indicators: Guarding, Grimacing, Sore ?Pain Intervention(s): Monitored during session, Limited activity within patient's tolerance  ? ? ?Home Living   ?  ?  ?  ?  ?  ?  ?  ?  ?  ?   ?  ?Prior Function    ?  ?  ?   ? ?PT Goals (current goals can now be found in the care plan section) Progress towards PT goals: Progressing toward goals ? ?  ?Frequency ? ? ? Min 2X/week ? ? ? ?  ?PT Plan Current plan remains appropriate  ? ? ?Co-evaluation   ?  ?  ?  ?  ? ?  ?AM-PAC PT "6 Clicks" Mobility   ?Outcome Measure ? Help needed turning from your back to your side while in a flat bed without using bedrails?: None ?Help needed moving from lying on your back to sitting on the side of a flat bed without using bedrails?: None ?Help needed moving to and from a bed to a chair (including a wheelchair)?: None ?Help needed standing up from a chair using your arms (e.g., wheelchair or bedside chair)?: None ?Help needed to walk in hospital room?: A Little ?Help needed climbing 3-5 steps with a railing? : A Little ?6 Click Score: 22 ? ?  ?End of Session   ?Activity Tolerance: Patient tolerated treatment well;Patient limited by pain ?Patient left: in bed;with call bell/phone within reach ?Nurse Communication: Mobility status ?PT Visit Diagnosis: Other abnormalities of gait and mobility (R26.89);Muscle weakness (generalized) (M62.81);Pain ?Pain - Right/Left: Right ?Pain - part of body: Shoulder ?  ? ? ?Time: [id number] ?PT Time Calculation (min) (ACUTE ONLY): 22 min ? ?Charges:  $Therapeutic Exercise: 8-22 mins          ?          ? ?Mabeline Caras, PT, DPT ?Acute Rehabilitation Services  ?Pager [phone] ?Office [phone] ? ?Derry Lory ?10/05/2021, 2:21 PM ? ?

## 2021-10-05 NOTE — Progress Notes (Signed)
Patient ID: Kimberly Booker, female   DOB: [birth year], 36 y.o.   MRN: [id number] ?Patient is seen in follow-up for osteomyelitis of the right clavicle.  The wound VAC is removed peroxide was used there was adherence of the soft tissue to the blue sponge.  There is a few small fragments of the blue sponge remaining that are superficial.  This should resolve with daily hydrogen peroxide dressing changes.  There is no exposed bone there is healthy granulation tissue.  There is no cellulitis no odor no drainage no deep abscess. ?

## 2021-10-05 NOTE — Progress Notes (Signed)
?Progress Note ? ?Patient: Kimberly Booker [id number] DOB: 1986-01-23  ?DOA: 08/26/2021  DOS: 10/05/2021  ?  ?Brief hospital course: ?36 year old with past medical history significant for anxiety, depression, suicidal attempt drug overdose, polysubstance abuse, right clavicle fracture, was admitted at [facility] on 08/15/2021 due to right shoulder abscess.She was treated with I&D in the emergency department followed by IV antibiotics therapy as an inpatient.  She was diagnosed with MRSA bacteremia, endocarditis was ruled out with a TEE performed 3/16, but she continued to have positive blood cultures despite treatment with ceftriaxone and vancomycin.  Antibiotic therapy was changed to daptomycin.  She was then transferred to [facility]. ?Extensive imagings have been done here which showed right clavicle osteomyelitis/phlegmon/abscess, widespread cervical, thoracic, lumbar discitis/osteomyelitis/epidural abscess/cord impingement.  MRI also showed bilateral iliopsoas abscess, right complex pleural effusion.  ID, orthopedics, neurosurgery following. ?Underwent lumbar laminectomy/debridement of abscess, L4-L5 on 3/25. Hospital course also remarkable for finding of right-sided empyema, gastric perforation. Status post emergent laparotomy and perforation of repair by general surgery.  PCCM was also following for chest tube on the right side, which was removed on 3/29. ?Underwent aspiration of the psoas abscesses on 3/30.  ?Underwent debridement of the right clavicle and application of wound VAC to the right shoulder area on 3/31. ?Developed severe lower back pain on 4/2.  MRI was done which showed improvement in the abscess.  Symptoms have improved. ?Flexiril added to her regimen for back pain and spasms with much improvement.  ?She is working with PT and progressing well.  ? ?Assessment and Plan: ?MRSA bacteremia ?Patient has disseminated MRSA bacteremia.  ?She had TEE outside facility on 3/16, negative for  endocarditis as per the report . TTE did not show any vegetation, might need TEE here but was delayed due emergent surgery for gastric perforation and other surgical procedures as outlined.   ?- Per infectious disease patient will need a total of 8 weeks of antibiotic therapy starting from the day of surgery on 09/03/2021 with an end date on 10/29/2021. Vancomycin continued. Cr stable, wnl. Blood cultures cleared as of the draw on 4/9.  ?  ?Gastric perforation (HCC) ?Complained of abdominal pain on 3/26.  Chest x-ray done after chest tube placement showed incidental finding of pneumoperitoneum.  CT abdomen/pelvis confirmed pneumoperitoneum.  General surgery consulted and she underwent emergent exploratory laparatomy with finding of gastric perforation.  ?- S/p surgical repair and patient is currently on soft diet and able to tolerate. We have advanced diet to soft diet.  ?  ?Abscess in epidural space of cervical spine ?Discitis/osteomyelitis at C2-3, C5-6, and C6-7. Ventral epidural abscess at C2-3, C5, and C6 with cord impingement especially at the lower 2 levels. Facet arthritis on the left at C7-T1 and right at C3-4. ?-Also showed discitis/osteomyelitis of T9- T10, T11-T12 ?-Was seen by neurosurgery, no focal deficits at this time. ?Continue with pain management. ?Patient able to ambulate and participate in therapy at this time. ?  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 Bilateral facet arthritis at L5-S1. Dorsal epidural abscess at L4 and L5,highly compressive on the thecal sac. ?S/P  lumbar laminectomy, debridement of abscess, L4-L5 on 3/25. ?No further interventions planned by neurosurgery.   ?On 4/2 patient was complaining of severe pain in the lower back radiating down to her legs.  MRI lumbar spine was ordered. ?It redemonstrated the osteomyelitis at T11-12 and L5-S1.  Noted to be status post L4-L5 laminectomy and  debridement  with significant decrease in the amount of the abscess.  A small collection was noted posterior to the S1 causing moderate thecal sac narrowing.  Other findings of iliopsoas abscess and sacroiliac septic arthritis were noted as seen previously on MRI pelvis. ?These findings were discussed with neurosurgery.  Patient does not have any focal neurological deficits.  No intervention was recommended.  Patient able to ambulate and no focal deficits seen. ?Continue with therapy and encourage ambulation as tolerated. Pt refusing lovenox.  ?Wound care consulted for dressing change.  ?- Physical exam reassuring at this time.  ?  ?Iliopsoas abscess (HCC) ?Sacroiliac septic arthritis/osteomyelitis. Pelvic MRI showed bilateral infectious sacroiliitis and osteomyelitis of the sacrum and periarticular right iliac bone. Bilateral psoas abscesses, Small bilateral paraspinal muscle abscesses, intramuscular abscess within the left piriformis muscle, small intramuscular abscess within the left gluteus medius muscle. ?  ?Pleural effusion ?Chest CT showed a large loculated right pleural effusion concerning for empyema, subcentimeter groundglass nodularity within the bilateral lungs concerning for septic emboli.  ?S/P chest tube placement.  Fluid noted to be exudative.  Cultures without any growth.  No need for DNase/tPA.   ?Chest x-ray showed resolution of the effusion.  Chest tube was discontinued on 3/29. Stable on room air. ?  ?Normocytic anemia ?S/p 2 units of PRBC transfusion at an outside facility.  Last hemoglobin on 09/15/2021 around 8.5, currently stable around 7.9. no signs of bleeding.  ?- Recheck weekly.  ?  ?Anxiety with depression ?- Noted to be on Prozac and Ativan as needed. ?  ?Acute osteomyelitis of clavicle, right: s/p debridement of right clavicle and application of wound VAC on 3/31. Wound care consulted for dressing change. ?- Continue routine wound care. Asked ortho to reevaluate, could likely DC wound vac.  ?   ?Polysubstance abuse (HCC): Reports last use was 2 years ago. ?- Seems to have firm understanding of the disease of addiction and will pursue community treatment options to maintain recovery. ?- CSW consulted to provide any available resources in regards to relocation in this area from [place].  ?  ?Acute pain: Due to multifocal infections.  ?- Continue fentanyl patch and prn percocet. Will plan to taper gradually as she would be best served by coming off opioids at discharge. Alternative option, if pain continues to be severe (has multiple significant causes of ongoing pain), could transition to methadone if provider in the outpatient setting could be arranged.  ?  ?Transaminitis: Resolved. +HepCAb with undetectable quant.  ?  ?Vaginal discharge: Resolved s/p diflucan  ?  ?Hypotension;  ?Resolved with fluid bolus.  ? ?Subjective: Wound vac removed this morning, was severely painful as it was adherent. Improving control now, walked with PT. Considering methadone as outpatient. ? ?Objective: ?Vitals:  ? 10/04/21 1921 10/04/21 2221 10/05/21 0418 10/05/21 0750  ?BP: (!) 90/52 102/60 (!) 92/50 (!) 100/59  ?Pulse: 86  85 85  ?Resp: 16  16 20   ?Temp: 98.2 ?F (36.8 ?C)  98.9 ?F (37.2 ?C) 98.9 ?F (37.2 ?C)  ?TempSrc: Oral   Oral  ?SpO2: 96%  97% 97%  ?Weight:      ?Height:      ?Left lower back lipoma noted without erythema, induration, focal TTP.  ? ?Right shoulder foam pad dressing in place, no surrounding erythema, no exudate or active bleeding. Appropriately tender to palpation.  ? ?Midline abdominal wound is mostly scarred, small areas of very shallow wounds still. ABD pad c/d/i ? ?RRR, clear ? ?Family Communication: None at  bedside ? ?Disposition: ?Status is: Inpatient ?Remains inpatient appropriate because: Needs IV antibiotics with end date 10/29/2021 ?Planned Discharge Destination: Home ? ?Tyrone Nine, MD ?10/05/2021 1:34 PM ?Page by [email or web address]  ?

## 2021-10-06 DIAGNOSIS — K255 Chronic or unspecified gastric ulcer with perforation: Secondary | ICD-10-CM

## 2021-10-06 DIAGNOSIS — F1910 Other psychoactive substance abuse, uncomplicated: Secondary | ICD-10-CM

## 2021-10-06 DIAGNOSIS — J90 Pleural effusion, not elsewhere classified: Secondary | ICD-10-CM

## 2021-10-06 LAB — VANCOMYCIN, PEAK: Vancomycin Pk: 37 ug/mL (ref 30–40)

## 2021-10-06 LAB — VANCOMYCIN, RANDOM: Vancomycin Rm: 16

## 2021-10-06 MED ORDER — VANCOMYCIN HCL 1250 MG/250ML IV SOLN
1250.0000 mg | Freq: Two times a day (BID) | INTRAVENOUS | Status: DC
  Administered 2021-10-07 – 2021-10-29 (×44): 1250 mg via INTRAVENOUS
  Filled 2021-10-06 (×53): qty 250

## 2021-10-06 NOTE — Progress Notes (Signed)
Pharmacy Antibiotic Note ? ?Kimberly Booker is a 36 y.o. female admitted on 08/26/2021 with  disseminated MRSA bacteremia (diffuse discitis/osteomyelitis/epidural abscess of cervical, thoracic and lumbar spine, bilateral septic arthritis, osteomyelitis of R clavicle, R lung empyema) .  ?  ?Patient was initially started on vancomycin 3/13-3/15 along with ceftriaxone 3/13-3/24. Vancomycin was transitioned to daptomycin and given 3/16-3/25. Pt was transferred to [facility] on 08/26/21. ID has consulted pharmacy dose vancomycin for coverage of MRSA R lung empyema.  ?  ?Patient is s/p placement of IR drains into iliopsoas abscesses 3/30 and debridement of right clavicle 3/31. Scr remains stable.  ?Tentative end date: 10/29/21 per ID recs (8 wks) ? ?5/3 PM update:  ?AUC is supra-therapeutic at 621 (VP-37, VR-16) ? ?Plan: ?Change Vancomycin to 1250 mg IV every 12 hours ?>>New calculated AUC: 516 ?Goal AUC 400-600 ?Monitor renal function  ? ?Height: 5\' 7"  (170.2 cm) ?Weight: 77.1 kg (170 lb) ?IBW/kg (Calculated) : 61.6 ? ?Temp (24hrs), Avg:98.3 ?F (36.8 ?C), Min:98 ?F (36.7 ?C), Max:98.7 ?F (37.1 ?C) ? ?Recent Labs  ?Lab 09/30/21 ?0300 10/01/21 ?1635 10/01/21 ?2042 10/04/21 ?12/04/21 10/05/21 ?1259 10/06/21 ?1627 10/06/21 ?2231  ?WBC 7.0  --   --   --   --   --   --   ?CREATININE 0.40*  --   --  0.39*  --   --   --   ?VANCOTROUGH  --   --  11*  --   --   --   --   ?VANCOPEAK 30   < >  --   --  37 37  --   ?VANCORANDOM  --   --   --   --   --   --  16  ? < > = values in this interval not displayed.  ? ?  ?Estimated Creatinine Clearance: 104.1 mL/min (A) (by C-G formula based on SCr of 0.39 mg/dL (L)).   ? ?Allergies  ?Allergen Reactions  ? Amoxicillin Swelling  ?  Throat closes  ? ? ?Antimicrobials this admission: ?Vanc 3/13 >> 3/15 ?Ceftriaxone 3/13 >> 3/24 ?Daptomycin 3/16 >> 3/25 ?Vanc 3/26 >> ? ?Microbiology results: ?3/12 Bcx x2: 4/4 MRSA ?3/12 Chest wound cx: MRSA ?3/14 Bcx: 4/4 MRSA ?3/22 Bcx: ngtd ?3/24 Bcx: ngtd ?3/25 lumbar  epidural wound/abscess: MRSA ?3/31 fungal cx: negative ?3/31 bone cx: negative ? ? ?4/31, PharmD, BCPS ?Clinical Pharmacist ?Phone: [phone] ? ?

## 2021-10-06 NOTE — Progress Notes (Signed)
Mobility Specialist Progress Note: ? ? 10/06/21 1347  ?Mobility  ?Activity Ambulated with assistance in hallway  ?Level of Assistance Standby assist, set-up cues, supervision of patient - no hands on  ?Assistive Device None  ?Distance Ambulated (ft) 300 ft  ?Activity Response Tolerated well  ?$Mobility charge 1 Mobility  ? ?Pt received in bed willing to participate in mobility. Complaints of 8/10 back pain. Left in bed with call bell in reach and all needs met.  ? ?Kimberly Booker ?Mobility Specialist ?Primary Phone [phone] ? ?

## 2021-10-06 NOTE — Progress Notes (Addendum)
Occupational Therapy Treatment ?Patient Details ?Name: Kimberly Booker ?MRN: [id number] ?DOB: 03/08/1986 ?Today's Date: 10/06/2021 ? ? ?History of present illness Pt is a 36 y.o. female initially admitted to [facility] 08/15/21 with R shoulder abscess; admit to [facility] 08/26/21 with MRSA bacteremia. 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 S/p L4-5 laminectomy and abscess debridement 3/25. Pt with pleural effusion s/p R pigtail chest tube 3/26. Abdominal CT showed bowel perforation; s/p ex lap with patch repair on 3/27. S/p R psoas abscess drainage on 3/30. S/p R clavicle I&D with wound vac placement 3/31. Pt developed severe LBP on 4/2; MRI showed improvement in abscess. R clavicle wound vac removed 5/2. PMH includes anxiety, depression, polysubstance abuse, R clavicle fx ~8 mo prior to admission. ?  ?OT comments ? Session focused on tub transfer training with techniques to minimize back pain with pt completing with Modified Independence. Pt currently completing sponge bathing, dressing and toileting tasks Independently with RW vs no AD, thus meeting 4/4 OT goals. Pt with WFL ROM of RUE with good pain control - secure chat sent to MD with clearance for UB resistance exercises, as well as shower privileges. Updated goals accordingly with further focus of IADLs and UE HEP. Likely appropriate for DC from acute OT services soon.  ? ?Recommendations for follow up therapy are one component of a multi-disciplinary discharge planning process, led by the attending physician.  Recommendations may be updated based on patient status, additional functional criteria and insurance authorization. ?   ?Follow Up Recommendations ? No OT follow up  ?  ?Assistance Recommended at Discharge PRN  ?Patient can return home with the following ? Assistance with cooking/housework;Assist for transportation ?  ?Equipment Recommendations ? None recommended by OT  ?  ?Recommendations for Other Services   ? ?  ?Precautions /  Restrictions Precautions ?Precautions: Fall;Back;Other (comment) ?Precaution Comments: abdominal precautions for comfort; lumbar corset for comfort (pt declines use) ?Required Braces or Orthoses: Spinal Brace ?Spinal Brace: Lumbar corset ?Restrictions ?Weight Bearing Restrictions: No  ? ? ?  ? ?Mobility Bed Mobility ?Overal bed mobility: Modified Independent ?Bed Mobility: Supine to Sit ?  ?  ?  ?  ?  ?  ?  ? ?Transfers ?Overall transfer level: Independent ?Equipment used: None ?Transfers: Sit to/from Stand ?Sit to Stand: Independent ?  ?  ?  ?  ?  ?  ?  ?  ?Balance Overall balance assessment: Needs assistance ?Sitting-balance support: Feet supported ?Sitting balance-Leahy Scale: Normal ?  ?  ?Standing balance support: No upper extremity supported, During functional activity ?Standing balance-Leahy Scale: Good ?  ?  ?  ?  ?  ?  ?  ?  ?  ?  ?  ?  ?   ? ?ADL either performed or assessed with clinical judgement  ? ?ADL Overall ADL's : Independent ?  ?  ?  ?  ?  ?  ?  ?  ?  ?  ?Lower Body Dressing: Modified independent;Sitting/lateral leans ?Lower Body Dressing Details (indicate cue type and reason): using figure four position to minimize back pain ?  ?  ?  ?  ?Tub/ Shower Transfer: Tub transfer;Ambulation;Modified independent ?Tub/Shower Transfer Details (indicate cue type and reason): able to step over simulated tub height holding to wall though reports back pain with this technique. Educated on side stepping into tub with pt reported decreased pain with this strategy and plans to use ?  ?General ADL Comments: Pt reports sponge bathing self without issues,  going to bathroom independently and managing bed linens (though difficulty due to low bed and back pain). Collab on task modification and body mechanics optimization. Pt with desire to shower - secure chat sent to MD to ask about order placement if appropriate. Also confirming with MD any exercise restrictions for R UE ?  ? ?Extremity/Trunk Assessment Upper  Extremity Assessment ?Upper Extremity Assessment: Overall WFL for tasks assessed ?RUE Deficits / Details: wound vac recently removed from clavicle area. AROM WFL and pain much improved ?  ?Lower Extremity Assessment ?Lower Extremity Assessment: Defer to PT evaluation ?  ?  ?  ? ?Vision   ?Vision Assessment?: No apparent visual deficits ?  ?Perception   ?  ?Praxis   ?  ? ?Cognition Arousal/Alertness: Awake/alert ?Behavior During Therapy: [facility] for tasks assessed/performed ?Overall Cognitive Status: Within Functional Limits for tasks assessed ?  ?  ?  ?  ?  ?  ?  ?  ?  ?  ?  ?  ?  ?  ?  ?  ?  ?  ?  ?   ?Exercises   ? ?  ?Shoulder Instructions   ? ? ?  ?General Comments was completing LE exercises bed level on entry  ? ? ?Pertinent Vitals/ Pain       Pain Assessment ?Pain Assessment: Faces ?Faces Pain Scale: Hurts little more ?Pain Location: back ?Pain Descriptors / Indicators: Grimacing, Guarding ?Pain Intervention(s): Monitored during session, Other (comment) (has been using heat) ? ?Home Living   ?  ?  ?  ?  ?  ?  ?  ?  ?  ?  ?  ?  ?  ?  ?  ?  ?  ?  ? ?  ?Prior Functioning/Environment    ?  ?  ?  ?   ? ?Frequency ? Min 2X/week  ? ? ? ? ?  ?Progress Toward Goals ? ?OT Goals(current goals can now be found in the care plan section) ? Progress towards OT goals: Progressing toward goals ? ?Acute Rehab OT Goals ?Patient Stated Goal: decrease back pain, be able to shower ?OT Goal Formulation: With patient ?Time For Goal Achievement: 10/13/21 ?Potential to Achieve Goals: Good ?ADL Goals ?Pt Will Perform Upper Body Dressing: with modified independence;sitting;standing ?Pt Will Perform Lower Body Dressing: with modified independence;sit to/from stand;sitting/lateral leans ?Pt Will Transfer to Toilet: with modified independence;ambulating;regular height toilet ?Pt Will Perform Tub/Shower Transfer: with modified independence;Shower transfer;Tub transfer;rolling walker;ambulating;3 in 1  ?Plan Frequency remains  appropriate;Discharge plan remains appropriate   ? ?Co-evaluation ? ? ?   ?  ?  ?  ?  ? ?  ?AM-PAC OT "6 Clicks" Daily Activity     ?Outcome Measure ? ? Help from another person eating meals?: None ?Help from another person taking care of personal grooming?: None ?Help from another person toileting, which includes using toliet, bedpan, or urinal?: None ?Help from another person bathing (including washing, rinsing, drying)?: None ?Help from another person to put on and taking off regular upper body clothing?: None ?Help from another person to put on and taking off regular lower body clothing?: None ?6 Click Score: 24 ? ?  ?End of Session   ? ?OT Visit Diagnosis: Unsteadiness on feet (R26.81);Other abnormalities of gait and mobility (R26.89);Muscle weakness (generalized) (M62.81);Pain ?  ?Activity Tolerance Patient tolerated treatment well ?  ?Patient Left Other (comment) (ambulating around room) ?  ?Nurse Communication Mobility status ?  ? ?   ? ?Time: [id number] ?OT  Time Calculation (min): 17 min ? ?Charges: OT General Charges ?$OT Visit: 1 Visit ?OT Treatments ?$Self Care/Home Management : 8-22 mins ? ?Malachy Chamber, OTR/L ?Acute Rehab Services ?Office: [phone]  ? ?Layla Maw ?10/06/2021, 9:45 AM ?

## 2021-10-06 NOTE — Progress Notes (Signed)
?PROGRESS NOTE ? ? ? ?Kimberly PotashDiana G Booker  ZOX:[id number] DOB: April 09, 1986 DOA: 08/26/2021 ?PCP: Pcp, No  ? ?Brief Narrative:  ?36 year old with past medical history significant for anxiety, depression, suicidal attempt drug overdose, polysubstance abuse, right clavicle fracture, was admitted at [facility] on 08/15/2021 due to right shoulder abscess.She was treated with I&D in the emergency department followed by IV antibiotics therapy as an inpatient.  She was diagnosed with MRSA bacteremia, endocarditis was ruled out with a TEE performed 3/16, but she continued to have positive blood cultures despite treatment with ceftriaxone and vancomycin.  Antibiotic therapy was changed to daptomycin.  She was then transferred to [facility]. ? ?Extensive imagings have been done here which showed right clavicle osteomyelitis/phlegmon/abscess, widespread cervical, thoracic, lumbar discitis/osteomyelitis/epidural abscess/cord impingement.  MRI also showed bilateral iliopsoas abscess, right complex pleural effusion.  ID, orthopedics, neurosurgery following. ?Underwent lumbar laminectomy/debridement of abscess, L4-L5 on 3/25. Hospital course also remarkable for finding of right-sided empyema, gastric perforation. Status post emergent laparotomy and perforation of repair by general surgery.  PCCM followed for chest tube on the right side, which was removed on 3/29. ?Underwent aspiration of the psoas abscesses on 3/30.  ?Underwent debridement of the right clavicle and application of wound VAC to the right shoulder area on 3/31. ?Developed severe lower back pain on 4/2.  MRI was done which showed improvement in the abscess.  Symptoms have improved. ?She is working with PT and progressing well.  ? ?Assessment & Plan: ?  ?MRSA bacteremia ?-Patient has disseminated MRSA bacteremia.  ?-She had TEE outside facility on 3/16, negative for endocarditis as per the report . TTE did not show any vegetation ?- Per infectious disease patient  will need a total of 8 weeks of antibiotic therapy starting from the day of surgery on 09/03/2021 with an end date on 10/29/2021. Vancomycin continued. Cr stable. Blood cultures cleared as of the draw on 4/9.  ?  ?Gastric perforation (HCC) ?- S/p surgical repair and patient is currently on soft diet and able to tolerate.  ?-General surgery has signed off ? ?Abscess in epidural space of cervical spine ?Discitis/osteomyelitis at C2-3, C5-6, and C6-7. Ventral epidural abscess at C2-3, C5, and C6 with cord impingement especially at the lower 2 levels. Facet arthritis on the left at C7-T1 and right at C3-4. ?-Also showed discitis/osteomyelitis of T9- T10, T11-T12 ?-Was seen by neurosurgery, no focal deficits at this time. ?-Continue with pain management. ?-Patient able to ambulate and participate in therapy at this time. ?  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 Bilateral facet arthritis at L5-S1. Dorsal epidural abscess at L4 and L5,highly compressive on the thecal sac. ?-S/P  lumbar laminectomy, debridement of abscess, L4-L5 on 3/25. ?-No further interventions planned by neurosurgery.   ?-On 4/2 patient was complaining of severe pain in the lower back radiating down to her legs.  MRI lumbar redemonstrated the osteomyelitis at T11-12 and L5-S1.  Noted to be status post L4-L5 laminectomy and debridement with significant decrease in the amount of the abscess.  A small collection was noted posterior to the S1 causing moderate thecal sac narrowing.  Other findings of iliopsoas abscess and sacroiliac septic arthritis were noted as seen previously on MRI pelvis. ?These findings were discussed with neurosurgery.  Patient does not have any focal neurological deficits.  No intervention was recommended.  Patient able to ambulate and no focal deficits seen. ?-Continue with therapy and encourage ambulation as tolerated. Pt  refusing lovenox.  ?-Wound  care consulted for dressing change.  ?- Physical exam reassuring at this time.  ?  ?Iliopsoas abscess (HCC) ?Sacroiliac septic arthritis/osteomyelitis. Pelvic MRI showed bilateral infectious sacroiliitis and osteomyelitis of the sacrum and periarticular right iliac bone. Bilateral psoas abscesses, Small bilateral paraspinal muscle abscesses, intramuscular abscess within the left piriformis muscle, small intramuscular abscess within the left gluteus medius muscle. ?  ?Pleural effusion/Empyema ?-S/P chest tube placement and subsequent removal on 3/29. Stable on room air. ?  ?Normocytic anemia ?-S/p 2 units of PRBC transfusion at an outside facility.  Last hemoglobin was 7.4 on 09/30/2021.  No signs of bleeding.  ?- Recheck weekly.  ?  ?Anxiety with depression ?- Noted to be on Prozac and Ativan as needed. ?  ?Acute osteomyelitis of clavicle, right: s/p debridement of right clavicle and application of wound VAC on 3/31.  ?- Continue routine wound care.  ?-Orthopedics evaluated her on 10/05/2021: Wound VAC removed on 10/05/2021 by orthopedics.  Wound care as per orthopedics recommendations for  ? ?polysubstance abuse (HCC): Reports last use was 2 years ago. ?- Seems to have firm understanding of the disease of addiction and will pursue community treatment options to maintain recovery. ?- CSW consulted to provide any available resources in regards to relocation in this area from [place].  ?  ?Acute pain: Due to multifocal infections.  ?- Continue fentanyl patch and prn percocet. Will plan to taper gradually as she would be best served by coming off opioids at discharge. Alternative option, if pain continues to be severe (has multiple significant causes of ongoing pain), could transition to methadone if provider in the outpatient setting could be arranged.  ?  ?Transaminitis: Resolved. 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  ?  ?Vaginal discharge: Resolved s/p diflucan  ?  ?Hypotension ?-Resolved with fluid bolus.   ? ? ?DVT prophylaxis: Lovenox ?Code Status: Full ?Family Communication: None at bedside ?Disposition Plan: ?Status is: Inpatient ?Remains inpatient appropriate because: Of need for IV antibiotics till 10/29/2021 ? ? ? ?Consultants: Orthopedic/ID/neurosurgery/PCCM/general surgery/IR ? ?Procedures: As above ? ?Antimicrobials:  ?Anti-infectives (From admission, onward)  ? ? Start     Dose/Rate Route Frequency Ordered Stop  ? 10/06/21 0000  vancomycin (VANCOREADY) IVPB 1500 mg/300 mL       ? 1,500 mg ?150 mL/hr over 120 Minutes Intravenous Every 12 hours 10/05/21 1526    ? 10/05/21 1522  vancomycin variable dose per unstable renal function (pharmacist dosing)  Status:  Discontinued       ?  Does not apply See admin instructions 10/05/21 1522 10/05/21 1527  ? 10/01/21 2153  vancomycin (VANCOREADY) IVPB 1500 mg/300 mL  Status:  Discontinued       ? 1,500 mg ?150 mL/hr over 120 Minutes Intravenous Every 12 hours 10/01/21 2154 10/05/21 1522  ? 09/30/21 1100  vancomycin (VANCOREADY) IVPB 750 mg/150 mL  Status:  Discontinued       ? 750 mg ?150 mL/hr over 60 Minutes Intravenous Every 8 hours 09/30/21 1007 10/01/21 2154  ? 09/27/21 1200  vancomycin (VANCOCIN) IVPB 1000 mg/200 mL premix  Status:  Discontinued       ? 1,000 mg ?200 mL/hr over 60 Minutes Intravenous Every 12 hours 09/27/21 1137 09/30/21 1007  ? 09/24/21 1145  fluconazole (DIFLUCAN) tablet 200 mg       ? 200 mg Oral  Once 09/24/21 1059 09/24/21 1410  ? 09/06/21 2200  vancomycin (VANCOREADY) IVPB 1500 mg/300 mL  Status:  Discontinued       ?  1,500 mg ?150 mL/hr over 120 Minutes Intravenous Every 12 hours 09/06/21 1257 09/27/21 1137  ? 09/03/21 1355  vancomycin (VANCOCIN) powder  Status:  Discontinued       ?   As needed 09/03/21 1355 09/03/21 1426  ? 09/01/21 2200  vancomycin (VANCOREADY) IVPB 1750 mg/350 mL  Status:  Discontinued       ? 1,750 mg ?175 mL/hr over 120 Minutes Intravenous Every 12 hours 09/01/21 1058 09/06/21 1257  ? 08/30/21 1000  metroNIDAZOLE  (FLAGYL) IVPB 500 mg  Status:  Discontinued       ? 500 mg ?100 mL/hr over 60 Minutes Intravenous Every 12 hours 08/30/21 0036 08/30/21 1708  ? 08/30/21 0600  metroNIDAZOLE (FLAGYL) IVPB 500 mg  Status:  Judi Cong

## 2021-10-06 NOTE — Progress Notes (Signed)
Nutrition Follow-up ? ?DOCUMENTATION CODES:  ? ?Not applicable ? ?INTERVENTION:  ?Continue Ensure Enlive po TID, each supplement provides 350 kcal and 20 grams of protein. ? ?Continue MVI once daily.  ? ?NUTRITION DIAGNOSIS:  ? ?Increased nutrient needs related to acute illness, post-op healing, wound healing as evidenced by estimated needs; ongoing ? ?GOAL:  ? ?Patient will meet greater than or equal to 90% of their needs; ongoing ? ?MONITOR:  ? ?PO intake, Supplement acceptance, Labs, Weight trends, Skin, I & O's ? ?REASON FOR ASSESSMENT:  ? ?Consult ?Assessment of nutrition requirement/status ? ?ASSESSMENT:  ? ?36 year old female with medical history of anxiety, depression, suicide attempt by drug OD, polysubstance abuse, R clavicle fx 8 months ago. She presented to the ED ongoing R shoulder pain. She had gone to another hospital 2 weeks ago and found to have R shoulder abscess which was treated with I&D and IV abx. She was dx with MRSA bacteremia and endocarditis was r/o on TEE on 3/16. She was transferred from [facility] to [facility] due to ongoing positive blood cultures despite treatment. Once at [facility], extensive imaging showed R clavicle osteomyelitis/phlegmon/abscess, widespread cervical,thoracic,lumbar discitis/osteomyelitis/epidural abscess/cord impingement. MRI showed bilateral iliopsoas abscess, R complex pleural effusion. ID, Orthopedics, and Neurosurgery following.  Currently on daptomycin. Plan for transthoracic echo. Neurosurgery planning for lumbar laminectomy for debridement of abscess, L4-L5.  ? ?Per infectious disease patient will need a total of 8 weeks of antibiotic therapy starting from the day of surgery on 09/03/2021 with an end date on 10/29/2021. ? ?3/25 - s/p lumbar laminectomy/debridement of abscess, L4-L5 ?3/27 - s/p dx laparoscopy, exploratory laparotomy, graham patch repair of gastric ulcer perforation; NGT placed to suction ?3/30 - NGT removed ?3/31 - s/p debridement  R clavicle and application of woundVAC ?4/02 - MRI spine redemonstrated osteomyelitis at T11-12 and L5-S1 ?5/2 - wound VAC removed ? ?Meal completion has been 75-100%. Pt currently has Ensure ordered with varied consumption. RD to continue with current orders to aid in caloric and protein needs.  ? ?Labs and medications reviewed.  ? ?Diet Order:   ?Diet Order   ? ?       ?  Diet regular Room service appropriate? Yes; Fluid consistency: Thin  Diet effective now       ?  ? ?  ?  ? ?  ? ? ?EDUCATION NEEDS:  ? ?Not appropriate for education at this time ? ?Skin:  Skin Assessment: Reviewed RN Assessment ?Skin Integrity Issues:: Wound VAC ?Wound Vac: N/A ?Incisions: throat, back, abdomen ? ?Last BM:  4/30 ? ?Height:  ? ?Ht Readings from Last 1 Encounters:  ?09/02/21 5\' 7"  (1.702 m)  ? ? ?Weight:  ? ?Wt Readings from Last 1 Encounters:  ?09/02/21 77.1 kg  ? ? ?Ideal Body Weight:  61.4 kg ? ?BMI:  Body mass index is 26.63 kg/m?. ? ?Estimated Nutritional Needs:  ? ?Kcal:  2200-2400 ? ?Protein:  110-125 grams ? ?Fluid:  >/= 2.2 L/day ? ?09/04/21, MS, RD, LDN ?RD pager number/after hours weekend pager number on Amion. ? ?

## 2021-10-06 NOTE — Plan of Care (Signed)

## 2021-10-07 LAB — BASIC METABOLIC PANEL
Anion gap: 8 (ref 5–15)
BUN: 9 mg/dL (ref 6–20)
CO2: 26 mmol/L (ref 22–32)
Calcium: 8.8 mg/dL — ABNORMAL LOW (ref 8.9–10.3)
Chloride: 103 mmol/L (ref 98–111)
Creatinine, Ser: 0.36 mg/dL — ABNORMAL LOW (ref 0.44–1.00)
GFR, Estimated: >60 mL/min (ref >60)
Glucose, Bld: 99 mg/dL (ref 70–99)
Potassium: 3.9 mmol/L (ref 3.5–5.1)
Sodium: 137 mmol/L (ref 135–145)

## 2021-10-07 LAB — CBC
HCT: 24.5 % — ABNORMAL LOW (ref 36.0–46.0)
Hemoglobin: 7.7 g/dL — ABNORMAL LOW (ref 12.0–15.0)
MCH: 27.5 pg (ref 26.0–34.0)
MCHC: 31.4 g/dL (ref 30.0–36.0)
MCV: 87.5 fL (ref 80.0–100.0)
Platelets: 387 10*3/uL (ref 150–400)
RBC: 2.8 MIL/uL — ABNORMAL LOW (ref 3.87–5.11)
RDW: 13.8 % (ref 11.5–15.5)
WBC: 7 10*3/uL (ref 4.0–10.5)
nRBC: 0 % (ref 0.0–0.2)

## 2021-10-07 NOTE — Progress Notes (Addendum)
?PROGRESS NOTE ? ? ? ?Kimberly Booker  MRN:[id number] DOB: 05/24/1986 DOA: 08/26/2021 ?PCP: Pcp, No  ? ?Brief Narrative:  ?36 year old with past medical history significant for anxiety, depression, suicidal attempt drug overdose, polysubstance abuse, right clavicle fracture, was admitted at [facility] on 08/15/2021 due to right shoulder abscess.She was treated with I&D in the emergency department followed by IV antibiotics therapy as an inpatient.  She was diagnosed with MRSA bacteremia, endocarditis was ruled out with a TEE performed 3/16, but she continued to have positive blood cultures despite treatment with ceftriaxone and vancomycin.  Antibiotic therapy was changed to daptomycin.  She was then transferred to [place]. ? ?Extensive imagings have been done here which showed right clavicle osteomyelitis/phlegmon/abscess, widespread cervical, thoracic, lumbar discitis/osteomyelitis/epidural abscess/cord impingement.  MRI also showed bilateral iliopsoas abscess, right complex pleural effusion.  ID, orthopedics, neurosurgery following. ?Underwent lumbar laminectomy/debridement of abscess, L4-L5 on 3/25. Hospital course also remarkable for finding of right-sided empyema, gastric perforation. Status post emergent laparotomy and perforation of repair by general surgery.  PCCM followed for chest tube on the right side, which was removed on 3/29. ?Underwent aspiration of the psoas abscesses on 3/30.  ?Underwent debridement of the right clavicle and application of wound VAC to the right shoulder area on 3/31. ?Developed severe lower back pain on 4/2.  MRI was done which showed improvement in the abscess.  Symptoms have improved. ?She is working with PT and progressing well.  ? ?Assessment & Plan: ?  ?MRSA bacteremia ?-Patient has disseminated MRSA bacteremia.  ?-She had TEE at outside facility on 3/16, negative for endocarditis as per the report . TTE did not show any vegetation ?- Per infectious disease  patient will need a total of 8 weeks of antibiotic therapy starting from the day of surgery on 09/03/2021 with an end date on 10/29/2021. Vancomycin continued. Cr stable. Blood cultures cleared as of the draw on 4/9.  ?-Currently afebrile and hemodynamically stable. ?  ?Gastric perforation ?- S/p surgical repair and patient is currently on soft diet and able to tolerate.  ?-General surgery has signed off ? ?Abscess in epidural space of cervical spine ?Discitis/osteomyelitis at C2-3, C5-6, and C6-7. Ventral epidural abscess at C2-3, C5, and C6 with cord impingement especially at the lower 2 levels. Facet arthritis on the left at C7-T1 and right at C3-4. ?-Also showed discitis/osteomyelitis of T9- T10, T11-T12 ?-Was seen by neurosurgery, no focal deficits at this time. ?-Continue with pain management. ?-Patient able to ambulate and participate in therapy at this time. ?  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 Bilateral facet arthritis at L5-S1. Dorsal epidural abscess at L4 and L5,highly compressive on the thecal sac. ?-S/P  lumbar laminectomy, debridement of abscess, L4-L5 on 3/25. ?-No further interventions planned by neurosurgery.   ?-On 4/2 patient was complaining of severe pain in the lower back radiating down to her legs.  MRI lumbar redemonstrated the osteomyelitis at T11-12 and L5-S1.  Noted to be status post L4-L5 laminectomy and debridement with significant decrease in the amount of the abscess.  A small collection was noted posterior to the S1 causing moderate thecal sac narrowing.  Other findings of iliopsoas abscess and sacroiliac septic arthritis were noted as seen previously on MRI pelvis. ?These findings were discussed with neurosurgery.  Patient does not have any focal neurological deficits.  No intervention was recommended.  Patient able to ambulate and no focal deficits seen. ?-Continue with therapy and   encourage  ambulation as tolerated. Pt refusing lovenox.  ?-Wound care consulted for dressing change.  ?- Physical exam reassuring at this time.  ?  ?Iliopsoas abscess  ?Sacroiliac septic arthritis/osteomyelitis. Pelvic MRI showed bilateral infectious sacroiliitis and osteomyelitis of the sacrum and periarticular right iliac bone. Bilateral psoas abscesses, Small bilateral paraspinal muscle abscesses, intramuscular abscess within the left piriformis muscle, small intramuscular abscess within the left gluteus medius muscle. ?  ?Pleural effusion/Empyema ?-S/P chest tube placement and subsequent removal on 3/29. Stable on room air. ?  ?Normocytic anemia ?-S/p 2 units of PRBC transfusion at an outside facility.  hemoglobin 7.7 today.  No signs of bleeding.  ?- Recheck weekly.  ?  ?Anxiety with depression ?- Noted to be on Prozac and Ativan as needed. ?  ?Acute osteomyelitis of clavicle, right: s/p debridement of right clavicle and application of wound VAC on 3/31.  ?- Continue routine wound care.  ?-Orthopedics evaluated her on 10/05/2021: Wound VAC removed on 10/05/2021 by orthopedics.  Wound care as per orthopedics recommendations for  ? ?polysubstance abuse (HCC): Reports last use was 2 years ago. ?- Seems to have firm understanding of the disease of addiction and will pursue community treatment options to maintain recovery. ?- CSW consulted to provide any available resources in regards to relocation in this area from [place].  ?  ?Acute pain: Due to multifocal infections.  ?- Continue fentanyl patch and prn percocet. Will plan to taper gradually as she would be best served by coming off opioids at discharge. Alternative option, if pain continues to be severe (has multiple significant causes of ongoing pain), could transition to methadone if provider in the outpatient setting could be arranged.  ?  ?Transaminitis: Resolved. 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  ?  ?Vaginal discharge: Resolved s/p diflucan  ?   ?Hypotension ?-Resolved with fluid bolus.  ? ? ?DVT prophylaxis: Lovenox ?Code Status: Full ?Family Communication: None at bedside ?Disposition Plan: ?Status is: Inpatient ?Remains inpatient appropriate because: Of need for IV antibiotics till 10/29/2021 ? ? ? ?Consultants: Orthopedic/ID/neurosurgery/PCCM/general surgery/IR ? ?Procedures: As above ? ?Antimicrobials:  ?Anti-infectives (From admission, onward)  ? ? Start     Dose/Rate Route Frequency Ordered Stop  ? 10/07/21 0100  vancomycin (VANCOREADY) IVPB 1250 mg/250 mL       ? 1,250 mg ?166.7 mL/hr over 90 Minutes Intravenous Every 12 hours 10/06/21 2358    ? 10/06/21 0000  vancomycin (VANCOREADY) IVPB 1500 mg/300 mL  Status:  Discontinued       ? 1,500 mg ?150 mL/hr over 120 Minutes Intravenous Every 12 hours 10/05/21 1526 10/06/21 2358  ? 10/05/21 1522  vancomycin variable dose per unstable renal function (pharmacist dosing)  Status:  Discontinued       ?  Does not apply See admin instructions 10/05/21 1522 10/05/21 1527  ? 10/01/21 2153  vancomycin (VANCOREADY) IVPB 1500 mg/300 mL  Status:  Discontinued       ? 1,500 mg ?150 mL/hr over 120 Minutes Intravenous Every 12 hours 10/01/21 2154 10/05/21 1522  ? 09/30/21 1100  vancomycin (VANCOREADY) IVPB 750 mg/150 mL  Status:  Discontinued       ? 750 mg ?150 mL/hr over 60 Minutes Intravenous Every 8 hours 09/30/21 1007 10/01/21 2154  ? 09/27/21 1200  vancomycin (VANCOCIN) IVPB 1000 mg/200 mL premix  Status:  Discontinued       ? 1,000 mg ?200 mL/hr over 60 Minutes Intravenous Every 12 hours 09/27/21 1137 09/30/21 1007  ? 09/24/21 1145  fluconazole (  DIFLUCAN) tablet 200 mg       ? 200 mg Oral  Once 09/24/21 1059 09/24/21 1410  ? 09/06/21 2200  vancomycin (VANCOREADY) IVPB 1500 mg/300 mL  Status:  Discontinued       ? 1,500 mg ?150 mL/hr over 120 Minutes Intravenous Every 12 hours 09/06/21 1257 09/27/21 1137  ? 09/03/21 1355  vancomycin (VANCOCIN) powder  Status:  Discontinued       ?   As needed 09/03/21 1355  09/03/21 1426  ? 09/01/21 2200  vancomycin (VANCOREADY) IVPB 1750 mg/350 mL  Status:  Discontinued       ? 1,750 mg ?175 mL/hr over 120 Minutes Intravenous Every 12 hours 09/01/21 1058 09/06/21 1257  ? 08/30/21 1000  metroNIDA

## 2021-10-07 NOTE — Plan of Care (Signed)

## 2021-10-07 NOTE — Progress Notes (Signed)
Occupational Therapy Treatment ?Patient Details ?Name: Kimberly Booker ?MRN: [id number] ?DOB: Jan 09, 1986 ?Today's Date: 10/07/2021 ? ? ?History of present illness Pt is a 36 y.o. female initially admitted to [facility] 08/15/21 with R shoulder abscess; admit to [facility] 08/26/21 with MRSA bacteremia. 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 S/p L4-5 laminectomy and abscess debridement 3/25. Pt with pleural effusion s/p R pigtail chest tube 3/26. Abdominal CT showed bowel perforation; s/p ex lap with patch repair on 3/27. S/p R psoas abscess drainage on 3/30. S/p R clavicle I&D with wound vac placement 3/31. Pt developed severe LBP on 4/2; MRI showed improvement in abscess. R clavicle wound vac removed 5/2. PMH includes anxiety, depression, polysubstance abuse, R clavicle fx ~8 mo prior to admission. ?  ?OT comments ? Therapist assessed patient's UE strength. She has grossly 5/5 strength on the left except for her wrist and found to have decreased strength mostly in tricep, wrist (bilaterally) and with shoulder external rotation and internal rotation.Therapist provided patient with HEP targeting above muscle groups and provided patient with red theraband. Patient verbalized understanding and demonstrated exercises. Cont POC.  ? ?Recommendations for follow up therapy are one component of a multi-disciplinary discharge planning process, led by the attending physician.  Recommendations may be updated based on patient status, additional functional criteria and insurance authorization. ?   ?Follow Up Recommendations ? No OT follow up  ?  ?Assistance Recommended at Discharge PRN  ?Patient can return home with the following ? Assistance with cooking/housework;Assist for transportation ?  ?Equipment Recommendations ? None recommended by OT  ?  ?Recommendations for Other Services   ? ?  ?Precautions / Restrictions Precautions ?Precautions: Fall;Back;Other (comment) ?Precaution Comments: abdominal precautions  for comfort; lumbar corset for comfort (pt declines use) ?Required Braces or Orthoses: Spinal Brace ?Restrictions ?Weight Bearing Restrictions: No ?RUE Weight Bearing: Weight bearing as tolerated ?Other Position/Activity Restrictions: okay for resistance  ? ? ?  ? ? ? ? ?Extremity/Trunk Assessment Upper Extremity Assessment ?Upper Extremity Assessment: RUE deficits/detail;LUE deficits/detail ?RUE Deficits / Details: WFL ROM; shoulder flexion 4+/5, external rotation 4-/5, internal rotation 4/5, elbow flexion 5/5, tricep extension 4-/5, wrist ext 4-/5, grio 4/5 ?LUE Deficits / Details: WNL ROM, 5/5 strength ?  ?  ?  ?  ?  ? ?Vision Patient Visual Report: No change from baseline ?  ?  ?Perception   ?  ?Praxis   ?  ? ?Cognition Arousal/Alertness: Awake/alert ?Behavior During Therapy: [facility] for tasks assessed/performed ?Overall Cognitive Status: Within Functional Limits for tasks assessed ?  ?  ?  ?  ?  ?  ?  ?  ?  ?  ?  ?  ?  ?  ?  ?  ?  ?  ?  ?   ?Exercises Other Exercises ?Other Exercises: Medbridge HEP handout provided for exercises with resistance band - tricep extension, shoulder internal rotation, external rotation and wrist extension ? ?  ?Shoulder Instructions   ? ? ?  ?General Comments    ? ? ?Pertinent Vitals/ Pain       Pain Assessment ?Pain Assessment: No/denies pain ? ? ?Frequency ? Min 2X/week  ? ? ? ? ?  ?Progress Toward Goals ? ?OT Goals(current goals can now be found in the care plan section) ? Progress towards OT goals: Progressing toward goals ? ?Acute Rehab OT Goals ?Patient Stated Goal: improve endurance ?OT Goal Formulation: With patient ?Time For Goal Achievement: 10/13/21 ?Potential to Achieve Goals: Good  ?Plan Frequency  remains appropriate;Discharge plan remains appropriate   ? ?Co-evaluation ? ? ?   ?  ?  ?  ?  ? ?  ?AM-PAC OT "6 Clicks" Daily Activity     ?Outcome Measure ? ? Help from another person eating meals?: None ?Help from another person taking care of personal grooming?: None ?Help  from another person toileting, which includes using toliet, bedpan, or urinal?: None ?Help from another person bathing (including washing, rinsing, drying)?: None ?Help from another person to put on and taking off regular upper body clothing?: None ?Help from another person to put on and taking off regular lower body clothing?: None ?6 Click Score: 24 ? ?  ?End of Session   ? ?OT Visit Diagnosis: Unsteadiness on feet (R26.81);Other abnormalities of gait and mobility (R26.89);Muscle weakness (generalized) (M62.81);Pain ?  ?Activity Tolerance Patient tolerated treatment well ?  ?Patient Left in bed;with call bell/phone within reach ?  ?Nurse Communication  (okay to see) ?  ? ?   ? ?Time: [id number] ?OT Time Calculation (min): 12 min ? ?Charges: OT General Charges ?$OT Visit: 1 Visit ?OT Treatments ?$Therapeutic Exercise: 8-22 mins ? ?Joelie Schou, OTR/L ?Acute Care Rehab Services  ?Office [phone] ?Pager: [phone]  ? ?Johan Antonacci L Naoko Diperna ?10/07/2021, 3:41 PM ?

## 2021-10-07 NOTE — Progress Notes (Signed)
Mobility Specialist Progress Note  ? ? 10/07/21 1556  ?Mobility  ?Activity Ambulated independently in hallway  ?Level of Assistance Standby assist, set-up cues, supervision of patient - no hands on  ?Assistive Device None  ?Distance Ambulated (ft) 320 ft  ?Activity Response Tolerated well  ?$Mobility charge 1 Mobility  ? ?Pt received in bed and agreeable. No complaints on walk. Returned to bed with call bell in reach.   ? ?[place] ?Mobility Specialist  ?Primary: 5N M.S. Phone: [phone] ?Secondary: 6N M.S. Phone: [phone] ?  ?

## 2021-10-08 NOTE — Progress Notes (Signed)
?PROGRESS NOTE ? ? ? ?Kimberly Booker  MRN:[id number] DOB: 07/25/1985 DOA: 08/26/2021 ?PCP: Pcp, No  ? ?Brief Narrative:  ?36 year old with past medical history significant for anxiety, depression, suicidal attempt drug overdose, polysubstance abuse, right clavicle fracture, was admitted at [facility] on 08/15/2021 due to right shoulder abscess.She was treated with I&D in the emergency department followed by IV antibiotics therapy as an inpatient.  She was diagnosed with MRSA bacteremia, endocarditis was ruled out with a TEE performed 3/16, but she continued to have positive blood cultures despite treatment with ceftriaxone and vancomycin.  Antibiotic therapy was changed to daptomycin.  She was then transferred to [place]. ? ?Extensive imagings have been done here which showed right clavicle osteomyelitis/phlegmon/abscess, widespread cervical, thoracic, lumbar discitis/osteomyelitis/epidural abscess/cord impingement.  MRI also showed bilateral iliopsoas abscess, right complex pleural effusion.  ID, orthopedics, neurosurgery following. ?Underwent lumbar laminectomy/debridement of abscess, L4-L5 on 3/25. Hospital course also remarkable for finding of right-sided empyema, gastric perforation. Status post emergent laparotomy and perforation of repair by general surgery.  PCCM followed for chest tube on the right side, which was removed on 3/29. ?Underwent aspiration of the psoas abscesses on 3/30.  ?Underwent debridement of the right clavicle and application of wound VAC to the right shoulder area on 3/31. ?Developed severe lower back pain on 4/2.  MRI was done which showed improvement in the abscess.  Symptoms have improved. ?She is working with PT and progressing well.  ? ?Assessment & Plan: ?  ?MRSA bacteremia ?-Patient has disseminated MRSA bacteremia.  ?-She had TEE at outside facility on 3/16, negative for endocarditis as per the report . TTE did not show any vegetation ?- Per infectious disease  patient will need a total of 8 weeks of antibiotic therapy starting from the day of surgery on 09/03/2021 with an end date on 10/29/2021. Vancomycin continued. Cr stable. Blood cultures cleared as of the draw on 4/9.  ?-Currently afebrile and hemodynamically stable. ?  ?Gastric perforation ?- S/p surgical repair and patient is currently on soft diet and able to tolerate.  ?-General surgery has signed off ? ?Abscess in epidural space of cervical spine ?Discitis/osteomyelitis at C2-3, C5-6, and C6-7. Ventral epidural abscess at C2-3, C5, and C6 with cord impingement especially at the lower 2 levels. Facet arthritis on the left at C7-T1 and right at C3-4. ?-Also showed discitis/osteomyelitis of T9- T10, T11-T12 ?-Was seen by neurosurgery, no focal deficits at this time. ?-Continue with pain management. ?-Patient able to ambulate and participate in therapy at this time. ?  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 Bilateral facet arthritis at L5-S1. Dorsal epidural abscess at L4 and L5,highly compressive on the thecal sac. ?-S/P  lumbar laminectomy, debridement of abscess, L4-L5 on 3/25. ?-No further interventions planned by neurosurgery.   ?-On 4/2 patient was complaining of severe pain in the lower back radiating down to her legs.  MRI lumbar redemonstrated the osteomyelitis at T11-12 and L5-S1.  Noted to be status post L4-L5 laminectomy and debridement with significant decrease in the amount of the abscess.  A small collection was noted posterior to the S1 causing moderate thecal sac narrowing.  Other findings of iliopsoas abscess and sacroiliac septic arthritis were noted as seen previously on MRI pelvis. ?These findings were discussed with neurosurgery.  Patient does not have any focal neurological deficits.  No intervention was recommended.  Patient able to ambulate and no focal deficits seen. ?-Continue with therapy and   encourage  ambulation as tolerated. Pt refusing lovenox.  ?-Wound care consulted for dressing change.  ?- Physical exam reassuring at this time.  ?  ?Iliopsoas abscess  ?Sacroiliac septic arthritis/osteomyelitis. Pelvic MRI showed bilateral infectious sacroiliitis and osteomyelitis of the sacrum and periarticular right iliac bone. Bilateral psoas abscesses, Small bilateral paraspinal muscle abscesses, intramuscular abscess within the left piriformis muscle, small intramuscular abscess within the left gluteus medius muscle. ?  ?Pleural effusion/Empyema ?-S/P chest tube placement and subsequent removal on 3/29. Stable on room air. ?  ?Normocytic anemia ?-S/p 2 units of PRBC transfusion at an outside facility.  hemoglobin 7.7 today.  No signs of bleeding.  ?- Recheck weekly.  ?  ?Anxiety with depression ?- Noted to be on Prozac and Ativan as needed. ?  ?Acute osteomyelitis of clavicle, right: s/p debridement of right clavicle and application of wound VAC on 3/31.  ?- Continue routine wound care.  ?-Orthopedics evaluated her on 10/05/2021: Wound VAC removed on 10/05/2021 by orthopedics.  Wound care as per orthopedics recommendations for  ? ?polysubstance abuse (HCC): Reports last use was 2 years ago. ?- Seems to have firm understanding of the disease of addiction and will pursue community treatment options to maintain recovery. ?- CSW consulted to provide any available resources in regards to relocation in this area from [place].  ?  ?Acute pain: Due to multifocal infections.  ?- Continue fentanyl patch and prn percocet. Will plan to taper gradually as she would be best served by coming off opioids at discharge. Alternative option, if pain continues to be severe (has multiple significant causes of ongoing pain), could transition to methadone if provider in the outpatient setting could be arranged.  ?  ?Transaminitis: Resolved. 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  ?  ?Vaginal discharge: Resolved s/p diflucan  ?   ?Hypotension ?-Resolved with fluid bolus.  ? ? ?DVT prophylaxis: Lovenox ?Code Status: Full ?Family Communication: None at bedside ?Disposition Plan: ?Status is: Inpatient ?Remains inpatient appropriate because: Of need for IV antibiotics till 10/29/2021 as she has history of polysubstance abuse. ? ? ? ?Consultants: Orthopedic/ID/neurosurgery/PCCM/general surgery/IR ? ?Procedures: As above ? ?Antimicrobials:  ?Anti-infectives (From admission, onward)  ? ? Start     Dose/Rate Route Frequency Ordered Stop  ? 10/07/21 0100  vancomycin (VANCOREADY) IVPB 1250 mg/250 mL       ? 1,250 mg ?166.7 mL/hr over 90 Minutes Intravenous Every 12 hours 10/06/21 2358    ? 10/06/21 0000  vancomycin (VANCOREADY) IVPB 1500 mg/300 mL  Status:  Discontinued       ? 1,500 mg ?150 mL/hr over 120 Minutes Intravenous Every 12 hours 10/05/21 1526 10/06/21 2358  ? 10/05/21 1522  vancomycin variable dose per unstable renal function (pharmacist dosing)  Status:  Discontinued       ?  Does not apply See admin instructions 10/05/21 1522 10/05/21 1527  ? 10/01/21 2153  vancomycin (VANCOREADY) IVPB 1500 mg/300 mL  Status:  Discontinued       ? 1,500 mg ?150 mL/hr over 120 Minutes Intravenous Every 12 hours 10/01/21 2154 10/05/21 1522  ? 09/30/21 1100  vancomycin (VANCOREADY) IVPB 750 mg/150 mL  Status:  Discontinued       ? 750 mg ?150 mL/hr over 60 Minutes Intravenous Every 8 hours 09/30/21 1007 10/01/21 2154  ? 09/27/21 1200  vancomycin (VANCOCIN) IVPB 1000 mg/200 mL premix  Status:  Discontinued       ? 1,000 mg ?200 mL/hr over 60 Minutes Intravenous Every 12 hours 09/27/21 1137 09/30/21  1007  ? 09/24/21 1145  fluconazole (DIFLUCAN) tablet 200 mg       ? 200 mg Oral  Once 09/24/21 1059 09/24/21 1410  ? 09/06/21 2200  vancomycin (VANCOREADY) IVPB 1500 mg/300 mL  Status:  Discontinued       ? 1,500 mg ?150 mL/hr over 120 Minutes Intravenous Every 12 hours 09/06/21 1257 09/27/21 1137  ? 09/03/21 1355  vancomycin (VANCOCIN) powder  Status:   Discontinued       ?   As needed 09/03/21 1355 09/03/21 1426  ? 09/01/21 2200  vancomycin (VANCOREADY) IVPB 1750 mg/350 mL  Status:  Discontinued       ? 1,750 mg ?175 mL/hr over 120 Minutes Intravenous Every 12 hours 09/01/21 105

## 2021-10-08 NOTE — Plan of Care (Signed)

## 2021-10-08 NOTE — Progress Notes (Signed)
Physical Therapy Treatment ?Patient Details ?Name: Kimberly Booker ?MRN: [id number] ?DOB: [birth year] ?Today's Date: 10/08/2021 ? ? ?History of Present Illness Pt is a 36 y.o. female initially admitted to [facility] 08/15/21 with R shoulder abscess; admit to [facility] 08/26/21 with MRSA bacteremia. 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 S/p L4-5 laminectomy and abscess debridement 3/25. Pt with pleural effusion s/p R pigtail chest tube 3/26. Abdominal CT showed bowel perforation; s/p ex lap with patch repair on 3/27. S/p R psoas abscess drainage on 3/30. S/p R clavicle I&D with wound vac placement 3/31. Pt developed severe LBP on 4/2; MRI showed improvement in abscess. R clavicle wound vac removed 5/2. PMH includes anxiety, depression, polysubstance abuse, R clavicle fx ~8 mo prior to admission. ?  ?PT Comments  ? ? Pt progressing with mobility. Today's session focused on gait training (normalizing gait pattern without DME) and LE therex. Max education on importance of performing therex/HEP daily outside of PT sessions. Pt remains limited by generalized weakness, decreased activity tolerance, and impaired balance strategies/postural reactions. Pt working on finding options for places to stay at d/c; reached out to CM/SW for more resources. Will continue to follow acutely. ?   ?Recommendations for follow up therapy are one component of a multi-disciplinary discharge planning process, led by the attending physician.  Recommendations may be updated based on patient status, additional functional criteria and insurance authorization. ? ?Follow Up Recommendations ? No PT follow up ?  ?  ?Assistance Recommended at Discharge PRN  ?Patient can return home with the following Assistance with cooking/housework;Assist for transportation ?  ?Equipment Recommendations ? None recommended by PT  ?  ?Recommendations for Other Services   ? ? ?  ?Precautions / Restrictions Precautions ?Precautions: Fall;Other  (comment) ?Precaution Comments: abdominal precautions for comfort; lumbar corset for comfort (pt declines use) ?Restrictions ?Weight Bearing Restrictions: No  ?  ? ?Mobility ? Bed Mobility ?Overal bed mobility: Modified Independent ?Bed Mobility: Supine to Sit, Sit to Supine ?  ?  ?  ?  ?  ?  ?  ? ?Transfers ?Overall transfer level: Independent ?Equipment used: None ?Transfers: Sit to/from Stand ?  ?  ?  ?  ?  ?  ?  ?  ? ?Ambulation/Gait ?Ambulation/Gait assistance: Supervision ?Gait Distance (Feet): 660 Feet ?Assistive device: None ?Gait Pattern/deviations: Step-through pattern, Decreased stride length, Antalgic, Trunk flexed, Decreased dorsiflexion - right ?Gait velocity: Decreased ?  ?  ?General Gait Details: Slow, guarded gait without DME; noted decreased R ankle DF and increased RLE antalgic gait pattern with further distance, pt reports walking on R forefoot helps with R-side lower back pain; pt able to increase step length with cues ? ? ?Stairs ?Stairs: Yes ?Stairs assistance: Supervision ?Stair Management: Two rails, One rail Left, Step to pattern, Forwards, No rails ?Number of Stairs: 10 ?General stair comments: ascend/descend 10 steps with and without rail support and supervision for safety ? ? ?Wheelchair Mobility ?  ? ?Modified Rankin (Stroke Patients Only) ?  ? ? ?  ?Balance Overall balance assessment: Needs assistance ?Sitting-balance support: Feet supported ?Sitting balance-Leahy Scale: Good ?Sitting balance - Comments: difficulty maintaining LLE in figure four technique to don sock, educ on propping LLE on bed then pt able to don sock; no issue with R sock ?  ?Standing balance support: No upper extremity supported, During functional activity ?Standing balance-Leahy Scale: Good ?  ?  ?  ?  ?  ?  ?  ?High level balance activites: Side stepping,  Direction changes, Turns, Sudden stops, Head turns ?High Level Balance Comments: no overt instability or LOB with higher level balance tasks; pt remains guarded  with movement ?  ?  ?  ?  ? ?  ?Cognition Arousal/Alertness: Awake/alert ?Behavior During Therapy: [facility] for tasks assessed/performed ?Overall Cognitive Status: Within Functional Limits for tasks assessed ?  ?  ?  ?  ?  ?  ?  ?  ?  ?  ?  ?  ?  ?  ?  ?  ?  ?  ?  ? ?  ?Exercises Other Exercises ?Other Exercises: pt admits to not performing HEP provided earlier this week - in gym, pt able to perform calf raises (on flat ground, also on step to increase heel cord stretch), partial squats with BUE rail support, repeated sit<>stands from mat table (reliant on momentum or UE support, cues for technique) ? ?  ?General Comments General comments (skin integrity, edema, etc.): pt reports no longer able to d/c to dad's home, mom's home also not an option; reached out to CM/SW if able to provide pt with other options (i.e. extended stay hotel) ?  ?  ? ?Pertinent Vitals/Pain Pain Assessment ?Pain Assessment: Faces ?Faces Pain Scale: Hurts a little bit ?Pain Location: back ?Pain Descriptors / Indicators: Sore ?Pain Intervention(s): Monitored during session  ? ? ?Home Living   ?  ?  ?  ?  ?  ?  ?  ?  ?  ?   ?  ?Prior Function    ?  ?  ?   ? ?PT Goals (current goals can now be found in the care plan section) Acute Rehab PT Goals ?Patient Stated Goal: working on finding a place to stay at d/c ?PT Goal Formulation: With patient ?Time For Goal Achievement: 10/22/21 ?Potential to Achieve Goals: Good ?Progress towards PT goals: Progressing toward goals ? ?  ?Frequency ? ? ? Min 2X/week ? ? ? ?  ?PT Plan Current plan remains appropriate  ? ? ?Co-evaluation   ?  ?  ?  ?  ? ?  ?AM-PAC PT "6 Clicks" Mobility   ?Outcome Measure ? Help needed turning from your back to your side while in a flat bed without using bedrails?: None ?Help needed moving from lying on your back to sitting on the side of a flat bed without using bedrails?: None ?Help needed moving to and from a bed to a chair (including a wheelchair)?: None ?Help needed standing up from  a chair using your arms (e.g., wheelchair or bedside chair)?: None ?Help needed to walk in hospital room?: A Little ?Help needed climbing 3-5 steps with a railing? : A Little ?6 Click Score: 22 ? ?  ?End of Session   ?Activity Tolerance: Patient tolerated treatment well ?Patient left: in bed;with call bell/phone within reach ?Nurse Communication: Mobility status ?PT Visit Diagnosis: Other abnormalities of gait and mobility (R26.89);Muscle weakness (generalized) (M62.81);Pain ?  ? ? ?Time: 0254-2706 ?PT Time Calculation (min) (ACUTE ONLY): 28 min ? ?Charges:  $Gait Training: 8-22 mins ?$Therapeutic Exercise: 8-22 mins          ?          ? ?Ina Homes, PT, DPT ?Acute Rehabilitation Services  ?Pager [phone] ?Office [phone] ? ?Malachy Chamber ?10/08/2021, 4:52 PM ? ?

## 2021-10-09 NOTE — Plan of Care (Signed)

## 2021-10-09 NOTE — Progress Notes (Signed)
Occupational Therapy Treatment ?Patient Details ?Name: Kimberly Booker ?MRN: [id number] ?DOB: 14-Sep-1985 ?Today's Date: 10/09/2021 ? ? ?History of present illness Pt is a 36 y.o. female initially admitted to [facility] 08/15/21 with R shoulder abscess; admit to [facility] 08/26/21 with MRSA bacteremia. 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 S/p L4-5 laminectomy and abscess debridement 3/25. Pt with pleural effusion s/p R pigtail chest tube 3/26. Abdominal CT showed bowel perforation; s/p ex lap with patch repair on 3/27. S/p R psoas abscess drainage on 3/30. S/p R clavicle I&D with wound vac placement 3/31. Pt developed severe LBP on 4/2; MRI showed improvement in abscess. R clavicle wound vac removed 5/2. PMH includes anxiety, depression, polysubstance abuse, R clavicle fx ~8 mo prior to admission. ?  ?OT comments ? Pt continues to progress, acute OT services to continue to follow acutely   ? ?Recommendations for follow up therapy are one component of a multi-disciplinary discharge planning process, led by the attending physician.  Recommendations may be updated based on patient status, additional functional criteria and insurance authorization. ?   ?Follow Up Recommendations ? No OT follow up  ?  ?Assistance Recommended at Discharge PRN  ?Patient can return home with the following ? Assistance with cooking/housework;Assist for transportation ?  ?Equipment Recommendations ? None recommended by OT  ?  ?Recommendations for Other Services   ? ?  ?Precautions / Restrictions Precautions ?Precautions: Fall;Other (comment) ?Precaution Comments: abdominal precautions for comfort; lumbar corset for comfort (pt declines use) ?Required Braces or Orthoses: Spinal Brace ?Spinal Brace: Lumbar corset ?Restrictions ?Weight Bearing Restrictions: No ?RUE Weight Bearing: Weight bearing as tolerated  ? ? ?  ? ?Mobility Bed Mobility ?Overal bed mobility: Modified Independent ?Bed Mobility: Rolling, Sidelying to Sit, Sit  to Sidelying ?Rolling: Modified independent (Device/Increase time) ?Sidelying to sit: Modified independent (Device/Increase time) ?  ?  ?Sit to sidelying: Modified independent (Device/Increase time) ?General bed mobility comments: reviewed back/body mechanics ?  ? ?Transfers ?Overall transfer level: Independent ?Equipment used: None ?Transfers: Sit to/from Stand ?Sit to Stand: Independent ?  ?  ?  ?  ?  ?General transfer comment: slow, guarded pace of movement walking to and from bathroom ?  ?  ?Balance Overall balance assessment: Needs assistance ?Sitting-balance support: Feet supported ?Sitting balance-Leahy Scale: Good ?  ?  ?Standing balance support: No upper extremity supported, During functional activity ?Standing balance-Leahy Scale: Good ?  ?  ?  ?  ?  ?  ?  ?  ?  ?  ?  ?  ?   ? ?ADL either performed or assessed with clinical judgement  ? ?ADL Overall ADL's : Needs assistance/impaired ?  ?  ?Grooming: Wash/dry hands;Wash/dry face;Oral care;Applying deodorant;Supervision/safety;Standing ?  ?  ?Upper Body Bathing Details (indicate cue type and reason): simulated seated on shower seat ?Lower Body Bathing: Set up;Sitting/lateral leans ?Lower Body Bathing Details (indicate cue type and reason): simulated seated on shower seat ?  ?  ?  ?  ?Toilet Transfer: Sales executive;Ambulation ?  ?Toileting- Clothing Manipulation and Hygiene: Supervision/safety;Sit to/from stand ?  ?Tub/ Shower Transfer: Gaffer;Ambulation;Shower seat ?  ?Functional mobility during ADLs: Supervision/safety ?  ?  ? ?Extremity/Trunk Assessment Upper Extremity Assessment ?Upper Extremity Assessment: Overall WFL for tasks assessed ?  ?Lower Extremity Assessment ?Lower Extremity Assessment: Defer to PT evaluation ?  ?Cervical / Trunk Assessment ?Cervical / Trunk Assessment: Back Surgery;Other exceptions ?Cervical / Trunk Exceptions: abdominal surgery ?  ? ?Vision Baseline Vision/History: 0 No visual  deficits ?Patient  Visual Report: No change from baseline ?  ?  ?Perception   ?  ?Praxis   ?  ? ?Cognition Arousal/Alertness: Awake/alert ?Behavior During Therapy: [facility] for tasks assessed/performed ?Overall Cognitive Status: Within Functional Limits for tasks assessed ?  ?  ?  ?  ?  ?  ?  ?  ?  ?  ?  ?  ?  ?  ?  ?  ?General Comments: declined sitting in recliner after walking from bathroom ?  ?  ?   ?Exercises   ? ?  ?Shoulder Instructions   ? ? ?  ?General Comments    ? ? ?Pertinent Vitals/ Pain       Pain Assessment ?Pain Assessment: 0-10 ?Pain Score: 7  ?Pain Location: back ?Pain Descriptors / Indicators: Sore ?Pain Intervention(s): Monitored during session, Repositioned ? ?Home Living   ?  ?  ?  ?  ?  ?  ?  ?  ?  ?  ?  ?  ?  ?  ?  ?  ?  ?  ? ?  ?Prior Functioning/Environment    ?  ?  ?  ?   ? ?Frequency ? Min 2X/week  ? ? ? ? ?  ?Progress Toward Goals ? ?OT Goals(current goals can now be found in the care plan section) ? Progress towards OT goals: Progressing toward goals ? ?   ?Plan Frequency remains appropriate;Discharge plan remains appropriate   ? ?Co-evaluation ? ? ?   ?  ?  ?  ?  ? ?  ?AM-PAC OT "6 Clicks" Daily Activity     ?Outcome Measure ? ? Help from another person eating meals?: None ?Help from another person taking care of personal grooming?: None ?Help from another person toileting, which includes using toliet, bedpan, or urinal?: None ?Help from another person bathing (including washing, rinsing, drying)?: None ?Help from another person to put on and taking off regular upper body clothing?: None ?Help from another person to put on and taking off regular lower body clothing?: None ?6 Click Score: 24 ? ?  ?End of Session Equipment Utilized During Treatment: Gait belt ? ?OT Visit Diagnosis: Unsteadiness on feet (R26.81);Other abnormalities of gait and mobility (R26.89);Muscle weakness (generalized) (M62.81);Pain ?Pain - part of body:  (back) ?  ?Activity Tolerance Patient tolerated treatment well ?  ?Patient Left in  bed;with call bell/phone within reach ?  ?Nurse Communication   ?  ? ?   ? ?Time: [id number] ?OT Time Calculation (min): 17 min ? ?Charges: OT General Charges ?$OT Visit: 1 Visit ?OT Treatments ?$Self Care/Home Management : 8-22 mins ? ? ? ?Britt Bottom ?10/09/2021, 12:42 PM ?

## 2021-10-09 NOTE — Progress Notes (Signed)
Mobility Specialist: Progress Note ? ? 10/09/21 1642  ?Mobility  ?Activity Ambulated independently in hallway  ?Level of Assistance Standby assist, set-up cues, supervision of patient - no hands on  ?Assistive Device None  ?RUE Weight Bearing WBAT  ?Distance Ambulated (ft) 320 ft  ?Activity Response Tolerated well  ?$Mobility charge 1 Mobility  ? ?Pt received in bed and agreeable to ambulation. C/o back pain during session, no rating given. No other c/o throughout. Pt back to bed per request with call bell and phone in reach.  ? ?Kimberly Booker ?Mobility Specialist ?Mobility Specialist 5 North: [phone] ?Mobility Specialist 6 North: [phone] ? ?

## 2021-10-09 NOTE — Progress Notes (Signed)
?PROGRESS NOTE ? ? ? ?Kimberly Booker  MRN:[id number] DOB: 10/28/1985 DOA: 08/26/2021 ?PCP: Pcp, No  ? ?Brief Narrative:  ?36 year old with past medical history significant for anxiety, depression, suicidal attempt drug overdose, polysubstance abuse, right clavicle fracture, was admitted at [facility] on 08/15/2021 due to right shoulder abscess.She was treated with I&D in the emergency department followed by IV antibiotics therapy as an inpatient.  She was diagnosed with MRSA bacteremia, endocarditis was ruled out with a TEE performed 3/16, but she continued to have positive blood cultures despite treatment with ceftriaxone and vancomycin.  Antibiotic therapy was changed to daptomycin.  She was then transferred to [place]. ? ?Extensive imagings have been done here which showed right clavicle osteomyelitis/phlegmon/abscess, widespread cervical, thoracic, lumbar discitis/osteomyelitis/epidural abscess/cord impingement.  MRI also showed bilateral iliopsoas abscess, right complex pleural effusion.  ID, orthopedics, neurosurgery following. ?Underwent lumbar laminectomy/debridement of abscess, L4-L5 on 3/25. Hospital course also remarkable for finding of right-sided empyema, gastric perforation. Status post emergent laparotomy and perforation of repair by general surgery.  PCCM followed for chest tube on the right side, which was removed on 3/29. ?Underwent aspiration of the psoas abscesses on 3/30.  ?Underwent debridement of the right clavicle and application of wound VAC to the right shoulder area on 3/31. ?Developed severe lower back pain on 4/2.  MRI was done which showed improvement in the abscess.  Symptoms have improved. ?She is working with PT and progressing well.  ? ?Assessment & Plan: ?  ?MRSA bacteremia ?-Patient has disseminated MRSA bacteremia.  ?-She had TEE at outside facility on 3/16, negative for endocarditis as per the report . TTE did not show any vegetation ?- Per infectious disease  patient will need a total of 8 weeks of antibiotic therapy starting from the day of surgery on 09/03/2021 with an end date on 10/29/2021. Vancomycin continued. Cr stable. Blood cultures cleared as of the draw on 4/9.  ?-Currently afebrile and hemodynamically stable. ?  ?Gastric perforation ?- S/p surgical repair and patient is currently on soft diet and able to tolerate.  ?-General surgery has signed off ? ?Abscess in epidural space of cervical spine ?Discitis/osteomyelitis at C2-3, C5-6, and C6-7. Ventral epidural abscess at C2-3, C5, and C6 with cord impingement especially at the lower 2 levels. Facet arthritis on the left at C7-T1 and right at C3-4. ?-Also showed discitis/osteomyelitis of T9- T10, T11-T12 ?-Was seen by neurosurgery, no focal deficits at this time. ?-Continue with pain management. ?-Patient able to ambulate and participate in therapy at this time. ?  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 Bilateral facet arthritis at L5-S1. Dorsal epidural abscess at L4 and L5,highly compressive on the thecal sac. ?-S/P  lumbar laminectomy, debridement of abscess, L4-L5 on 3/25. ?-No further interventions planned by neurosurgery.   ?-On 4/2 patient was complaining of severe pain in the lower back radiating down to her legs.  MRI lumbar redemonstrated the osteomyelitis at T11-12 and L5-S1.  Noted to be status post L4-L5 laminectomy and debridement with significant decrease in the amount of the abscess.  A small collection was noted posterior to the S1 causing moderate thecal sac narrowing.  Other findings of iliopsoas abscess and sacroiliac septic arthritis were noted as seen previously on MRI pelvis. ?These findings were discussed with neurosurgery.  Patient does not have any focal neurological deficits.  No intervention was recommended.  Patient able to ambulate and no focal deficits seen. ?-Continue with therapy and   encourage  ambulation as tolerated. Pt refusing lovenox.  ?-Wound care consulted for dressing change.  ?- Physical exam reassuring at this time.  ?  ?Iliopsoas abscess  ?Sacroiliac septic arthritis/osteomyelitis. Pelvic MRI showed bilateral infectious sacroiliitis and osteomyelitis of the sacrum and periarticular right iliac bone. Bilateral psoas abscesses, Small bilateral paraspinal muscle abscesses, intramuscular abscess within the left piriformis muscle, small intramuscular abscess within the left gluteus medius muscle. ?  ?Pleural effusion/Empyema ?-S/P chest tube placement and subsequent removal on 3/29. Stable on room air. ?  ?Normocytic anemia ?-S/p 2 units of PRBC transfusion at an outside facility.  hemoglobin 7.7 today.  No signs of bleeding.  ?- Recheck weekly.  ?  ?Anxiety with depression ?- Noted to be on Prozac and Ativan as needed. ?  ?Acute osteomyelitis of clavicle, right: s/p debridement of right clavicle and application of wound VAC on 3/31.  ?- Continue routine wound care.  ?-Orthopedics evaluated her on 10/05/2021: Wound VAC removed on 10/05/2021 by orthopedics.  Wound care as per orthopedics recommendations for  ? ?polysubstance abuse (HCC): Reports last use was 2 years ago. ?- Seems to have firm understanding of the disease of addiction and will pursue community treatment options to maintain recovery. ?- CSW consulted to provide any available resources in regards to relocation in this area from [place].  ?  ?Acute pain: Due to multifocal infections.  ?- Continue fentanyl patch and prn percocet. Will plan to taper gradually as she would be best served by coming off opioids at discharge. Alternative option, if pain continues to be severe (has multiple significant causes of ongoing pain), could transition to methadone if provider in the outpatient setting could be arranged.  ?  ?Transaminitis: Resolved. 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  ?  ?Vaginal discharge: Resolved s/p diflucan  ?   ?Hypotension ?-Resolved with fluid bolus.  ? ? ?DVT prophylaxis: Lovenox ?Code Status: Full ?Family Communication: None at bedside ?Disposition Plan: ?Status is: Inpatient ?Remains inpatient appropriate because: Of need for IV antibiotics till 10/29/2021 as she has history of polysubstance abuse. ? ? ? ?Consultants: Orthopedic/ID/neurosurgery/PCCM/general surgery/IR ? ?Procedures: As above ? ?Antimicrobials:  ?Anti-infectives (From admission, onward)  ? ? Start     Dose/Rate Route Frequency Ordered Stop  ? 10/07/21 0100  vancomycin (VANCOREADY) IVPB 1250 mg/250 mL       ? 1,250 mg ?166.7 mL/hr over 90 Minutes Intravenous Every 12 hours 10/06/21 2358    ? 10/06/21 0000  vancomycin (VANCOREADY) IVPB 1500 mg/300 mL  Status:  Discontinued       ? 1,500 mg ?150 mL/hr over 120 Minutes Intravenous Every 12 hours 10/05/21 1526 10/06/21 2358  ? 10/05/21 1522  vancomycin variable dose per unstable renal function (pharmacist dosing)  Status:  Discontinued       ?  Does not apply See admin instructions 10/05/21 1522 10/05/21 1527  ? 10/01/21 2153  vancomycin (VANCOREADY) IVPB 1500 mg/300 mL  Status:  Discontinued       ? 1,500 mg ?150 mL/hr over 120 Minutes Intravenous Every 12 hours 10/01/21 2154 10/05/21 1522  ? 09/30/21 1100  vancomycin (VANCOREADY) IVPB 750 mg/150 mL  Status:  Discontinued       ? 750 mg ?150 mL/hr over 60 Minutes Intravenous Every 8 hours 09/30/21 1007 10/01/21 2154  ? 09/27/21 1200  vancomycin (VANCOCIN) IVPB 1000 mg/200 mL premix  Status:  Discontinued       ? 1,000 mg ?200 mL/hr over 60 Minutes Intravenous Every 12 hours 09/27/21 1137 09/30/21  1007  ? 09/24/21 1145  fluconazole (DIFLUCAN) tablet 200 mg       ? 200 mg Oral  Once 09/24/21 1059 09/24/21 1410  ? 09/06/21 2200  vancomycin (VANCOREADY) IVPB 1500 mg/300 mL  Status:  Discontinued       ? 1,500 mg ?150 mL/hr over 120 Minutes Intravenous Every 12 hours 09/06/21 1257 09/27/21 1137  ? 09/03/21 1355  vancomycin (VANCOCIN) powder  Status:   Discontinued       ?   As needed 09/03/21 1355 09/03/21 1426  ? 09/01/21 2200  vancomycin (VANCOREADY) IVPB 1750 mg/350 mL  Status:  Discontinued       ? 1,750 mg ?175 mL/hr over 120 Minutes Intravenous Every 12 hours 09/01/21 105

## 2021-10-10 NOTE — Progress Notes (Signed)
Mobility Specialist: Progress Note ? ? 10/10/21 1730  ?Mobility  ?Activity Ambulated independently in hallway  ?Level of Assistance Independent after set-up  ?Assistive Device None  ?RUE Weight Bearing WBAT  ?Distance Ambulated (ft) 320 ft  ?Activity Response Tolerated well  ?$Mobility charge 1 Mobility  ? ?Pt received in bed and agreeable to ambulation. C/o R ankle pain during session, otherwise asymptomatic. Pt back to bed per request with call bell and phone in reach.  ? ?Kimberly Booker ?Mobility Specialist ?Mobility Specialist 5 North: [phone] ?Mobility Specialist 6 North: [phone] ? ?

## 2021-10-10 NOTE — Progress Notes (Signed)
?PROGRESS NOTE ? ? ? ?Kimberly Booker  MRN:[id number] DOB: 02/15/1986 DOA: 08/26/2021 ?PCP: Pcp, No  ? ?Brief Narrative:  ?36 year old with past medical history significant for anxiety, depression, suicidal attempt drug overdose, polysubstance abuse, right clavicle fracture, was admitted at [facility] on 08/15/2021 due to right shoulder abscess.She was treated with I&D in the emergency department followed by IV antibiotics therapy as an inpatient.  She was diagnosed with MRSA bacteremia, endocarditis was ruled out with a TEE performed 3/16, but she continued to have positive blood cultures despite treatment with ceftriaxone and vancomycin.  Antibiotic therapy was changed to daptomycin.  She was then transferred to [place]. ? ?Extensive imagings have been done here which showed right clavicle osteomyelitis/phlegmon/abscess, widespread cervical, thoracic, lumbar discitis/osteomyelitis/epidural abscess/cord impingement.  MRI also showed bilateral iliopsoas abscess, right complex pleural effusion.  ID, orthopedics, neurosurgery following. ?Underwent lumbar laminectomy/debridement of abscess, L4-L5 on 3/25. Hospital course also remarkable for finding of right-sided empyema, gastric perforation. Status post emergent laparotomy and perforation of repair by general surgery.  PCCM followed for chest tube on the right side, which was removed on 3/29. ?Underwent aspiration of the psoas abscesses on 3/30.  ?Underwent debridement of the right clavicle and application of wound VAC to the right shoulder area on 3/31. ?Developed severe lower back pain on 4/2.  MRI was done which showed improvement in the abscess.  Symptoms have improved. ?She is working with PT and progressing well.  ? ?Assessment & Plan: ?  ?MRSA bacteremia ?-Patient has disseminated MRSA bacteremia.  ?-She had TEE at outside facility on 3/16, negative for endocarditis as per the report . TTE did not show any vegetation ?- Per infectious disease  patient will need a total of 8 weeks of antibiotic therapy starting from the day of surgery on 09/03/2021 with an end date on 10/29/2021. Vancomycin continued. Cr stable. Blood cultures cleared as of the draw on 4/9.  ?-Currently afebrile and hemodynamically stable. ?  ?Gastric perforation ?- S/p surgical repair and patient is currently on soft diet and able to tolerate.  ?-General surgery has signed off ? ?Abscess in epidural space of cervical spine ?Discitis/osteomyelitis at C2-3, C5-6, and C6-7. Ventral epidural abscess at C2-3, C5, and C6 with cord impingement especially at the lower 2 levels. Facet arthritis on the left at C7-T1 and right at C3-4. ?-Also showed discitis/osteomyelitis of T9- T10, T11-T12 ?-Was seen by neurosurgery, no focal deficits at this time. ?-Continue with pain management. ?-Patient able to ambulate and participate in therapy at this time. ?  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 Bilateral facet arthritis at L5-S1. Dorsal epidural abscess at L4 and L5,highly compressive on the thecal sac. ?-S/P  lumbar laminectomy, debridement of abscess, L4-L5 on 3/25. ?-No further interventions planned by neurosurgery.   ?-On 4/2 patient was complaining of severe pain in the lower back radiating down to her legs.  MRI lumbar redemonstrated the osteomyelitis at T11-12 and L5-S1.  Noted to be status post L4-L5 laminectomy and debridement with significant decrease in the amount of the abscess.  A small collection was noted posterior to the S1 causing moderate thecal sac narrowing.  Other findings of iliopsoas abscess and sacroiliac septic arthritis were noted as seen previously on MRI pelvis. ?These findings were discussed with neurosurgery.  Patient does not have any focal neurological deficits.  No intervention was recommended.  Patient able to ambulate and no focal deficits seen. ?-Continue with therapy and   encourage  ambulation as tolerated. Pt refusing lovenox.  ?-Wound care consulted for dressing change.  ?- Physical exam reassuring at this time.  ?  ?Iliopsoas abscess  ?Sacroiliac septic arthritis/osteomyelitis. Pelvic MRI showed bilateral infectious sacroiliitis and osteomyelitis of the sacrum and periarticular right iliac bone. Bilateral psoas abscesses, Small bilateral paraspinal muscle abscesses, intramuscular abscess within the left piriformis muscle, small intramuscular abscess within the left gluteus medius muscle. ?  ?Pleural effusion/Empyema ?-S/P chest tube placement and subsequent removal on 3/29. Stable on room air. ?  ?Normocytic anemia ?-S/p 2 units of PRBC transfusion at an outside facility.  hemoglobin 7.7 today.  No signs of bleeding.  ?- Recheck weekly.  ?  ?Anxiety with depression ?- Noted to be on Prozac and Ativan as needed. ?  ?Acute osteomyelitis of clavicle, right: s/p debridement of right clavicle and application of wound VAC on 3/31.  ?- Continue routine wound care.  ?-Orthopedics evaluated her on 10/05/2021: Wound VAC removed on 10/05/2021 by orthopedics.  Wound care as per orthopedics recommendations for  ? ?polysubstance abuse (HCC): Reports last use was 2 years ago. ?- Seems to have firm understanding of the disease of addiction and will pursue community treatment options to maintain recovery. ?- CSW consulted to provide any available resources in regards to relocation in this area from [place].  ?  ?Acute pain: Due to multifocal infections.  ?- Continue fentanyl patch and prn percocet. Will plan to taper gradually as she would be best served by coming off opioids at discharge. Alternative option, if pain continues to be severe (has multiple significant causes of ongoing pain), could transition to methadone if provider in the outpatient setting could be arranged.  ?  ?Transaminitis: Resolved. 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  ?  ?Vaginal discharge: Resolved s/p diflucan  ?   ?Hypotension ?-Resolved with fluid bolus.  ? ? ?DVT prophylaxis: Lovenox ?Code Status: Full ?Family Communication: None at bedside ?Disposition Plan: ?Status is: Inpatient ?Remains inpatient appropriate because: Of need for IV antibiotics till 10/29/2021 as she has history of polysubstance abuse. ? ? ? ?Consultants: Orthopedic/ID/neurosurgery/PCCM/general surgery/IR ? ?Procedures: As above ? ?Antimicrobials:  ?Anti-infectives (From admission, onward)  ? ? Start     Dose/Rate Route Frequency Ordered Stop  ? 10/07/21 0100  vancomycin (VANCOREADY) IVPB 1250 mg/250 mL       ? 1,250 mg ?166.7 mL/hr over 90 Minutes Intravenous Every 12 hours 10/06/21 2358    ? 10/06/21 0000  vancomycin (VANCOREADY) IVPB 1500 mg/300 mL  Status:  Discontinued       ? 1,500 mg ?150 mL/hr over 120 Minutes Intravenous Every 12 hours 10/05/21 1526 10/06/21 2358  ? 10/05/21 1522  vancomycin variable dose per unstable renal function (pharmacist dosing)  Status:  Discontinued       ?  Does not apply See admin instructions 10/05/21 1522 10/05/21 1527  ? 10/01/21 2153  vancomycin (VANCOREADY) IVPB 1500 mg/300 mL  Status:  Discontinued       ? 1,500 mg ?150 mL/hr over 120 Minutes Intravenous Every 12 hours 10/01/21 2154 10/05/21 1522  ? 09/30/21 1100  vancomycin (VANCOREADY) IVPB 750 mg/150 mL  Status:  Discontinued       ? 750 mg ?150 mL/hr over 60 Minutes Intravenous Every 8 hours 09/30/21 1007 10/01/21 2154  ? 09/27/21 1200  vancomycin (VANCOCIN) IVPB 1000 mg/200 mL premix  Status:  Discontinued       ? 1,000 mg ?200 mL/hr over 60 Minutes Intravenous Every 12 hours 09/27/21 1137 09/30/21  1007  ? 09/24/21 1145  fluconazole (DIFLUCAN) tablet 200 mg       ? 200 mg Oral  Once 09/24/21 1059 09/24/21 1410  ? 09/06/21 2200  vancomycin (VANCOREADY) IVPB 1500 mg/300 mL  Status:  Discontinued       ? 1,500 mg ?150 mL/hr over 120 Minutes Intravenous Every 12 hours 09/06/21 1257 09/27/21 1137  ? 09/03/21 1355  vancomycin (VANCOCIN) powder  Status:   Discontinued       ?   As needed 09/03/21 1355 09/03/21 1426  ? 09/01/21 2200  vancomycin (VANCOREADY) IVPB 1750 mg/350 mL  Status:  Discontinued       ? 1,750 mg ?175 mL/hr over 120 Minutes Intravenous Every 12 hours 09/01/21 105

## 2021-10-11 LAB — CBC WITH DIFFERENTIAL/PLATELET
Abs Immature Granulocytes: 0.02 10*3/uL (ref 0.00–0.07)
Basophils Absolute: 0.1 10*3/uL (ref 0.0–0.1)
Basophils Relative: 1 %
Eosinophils Absolute: 0.3 10*3/uL (ref 0.0–0.5)
Eosinophils Relative: 5 %
HCT: 25.1 % — ABNORMAL LOW (ref 36.0–46.0)
Hemoglobin: 8 g/dL — ABNORMAL LOW (ref 12.0–15.0)
Immature Granulocytes: 0 %
Lymphocytes Relative: 45 %
Lymphs Abs: 3.2 10*3/uL (ref 0.7–4.0)
MCH: 27.6 pg (ref 26.0–34.0)
MCHC: 31.9 g/dL (ref 30.0–36.0)
MCV: 86.6 fL (ref 80.0–100.0)
Monocytes Absolute: 0.5 10*3/uL (ref 0.1–1.0)
Monocytes Relative: 7 %
Neutro Abs: 2.9 10*3/uL (ref 1.7–7.7)
Neutrophils Relative %: 42 %
Platelets: 381 10*3/uL (ref 150–400)
RBC: 2.9 MIL/uL — ABNORMAL LOW (ref 3.87–5.11)
RDW: 14 % (ref 11.5–15.5)
WBC: 7 10*3/uL (ref 4.0–10.5)
nRBC: 0 % (ref 0.0–0.2)

## 2021-10-11 LAB — BASIC METABOLIC PANEL
Anion gap: 11 (ref 5–15)
BUN: 7 mg/dL (ref 6–20)
CO2: 26 mmol/L (ref 22–32)
Calcium: 9.1 mg/dL (ref 8.9–10.3)
Chloride: 99 mmol/L (ref 98–111)
Creatinine, Ser: 0.4 mg/dL — ABNORMAL LOW (ref 0.44–1.00)
GFR, Estimated: >60 mL/min (ref >60)
Glucose, Bld: 98 mg/dL (ref 70–99)
Potassium: 3.9 mmol/L (ref 3.5–5.1)
Sodium: 136 mmol/L (ref 135–145)

## 2021-10-11 LAB — MAGNESIUM: Magnesium: 1.8 mg/dL (ref 1.7–2.4)

## 2021-10-11 NOTE — Plan of Care (Signed)

## 2021-10-11 NOTE — Progress Notes (Signed)
?PROGRESS NOTE ? ? ? ?Kimberly Booker  [id number] DOB: 05/08/1986 DOA: 08/26/2021 ?PCP: Pcp, No  ? ?Brief Narrative:  ?36 year old with past medical history significant for anxiety, depression, suicidal attempt drug overdose, polysubstance abuse, right clavicle fracture, was admitted at [facility] on 08/15/2021 due to right shoulder abscess.She was treated with I&D in the emergency department followed by IV antibiotics therapy as an inpatient.  She was diagnosed with MRSA bacteremia, endocarditis was ruled out with a TEE performed 3/16, but she continued to have positive blood cultures despite treatment with ceftriaxone and vancomycin.  Antibiotic therapy was changed to daptomycin.  She was then transferred to [facility] ? ?Extensive imagings have been done here which showed right clavicle osteomyelitis/phlegmon/abscess, widespread cervical, thoracic, lumbar discitis/osteomyelitis/epidural abscess/cord impingement.  MRI also showed bilateral iliopsoas abscess, right complex pleural effusion.  ID, orthopedics, neurosurgery following. ?Underwent lumbar laminectomy/debridement of abscess, L4-L5 on 3/25. Hospital course also remarkable for finding of right-sided empyema, gastric perforation. Status post emergent laparotomy and perforation of repair by general surgery.  PCCM followed for chest tube on the right side, which was removed on 3/29. ?Underwent aspiration of the psoas abscesses on 3/30.  ?Underwent debridement of the right clavicle and application of wound VAC to the right shoulder area on 3/31. ?Developed severe lower back pain on 4/2.  MRI was done which showed improvement in the abscess.  Symptoms have improved. ?She is working with PT and progressing well.  ? ?Assessment & Plan: ?  ?MRSA bacteremia ?-Patient has disseminated MRSA bacteremia.  ?-She had TEE at outside facility on 3/16, negative for endocarditis as per the report . TTE did not show any vegetation ?- Per infectious disease  patient will need a total of 8 weeks of antibiotic therapy starting from the day of surgery on 09/03/2021 with an end date on 10/29/2021. Vancomycin continued. Cr stable. Blood cultures cleared as of the draw on 4/9.  ?-Currently afebrile and hemodynamically stable. ?  ?Gastric perforation ?- S/p surgical repair and patient is currently on soft diet and able to tolerate.  ?-General surgery has signed off ? ?Abscess in epidural space of cervical spine ?Discitis/osteomyelitis at C2-3, C5-6, and C6-7. Ventral epidural abscess at C2-3, C5, and C6 with cord impingement especially at the lower 2 levels. Facet arthritis on the left at C7-T1 and right at C3-4. ?-Also showed discitis/osteomyelitis of T9- T10, T11-T12 ?-Was seen by neurosurgery, no focal deficits at this time. ?-Continue with pain management. ?-Patient able to ambulate and participate in therapy at this time. ?  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 Bilateral facet arthritis at L5-S1. Dorsal epidural abscess at L4 and L5,highly compressive on the thecal sac. ?-S/P  lumbar laminectomy, debridement of abscess, L4-L5 on 3/25. ?-No further interventions planned by neurosurgery.   ?-On 4/2 patient was complaining of severe pain in the lower back radiating down to her legs.  MRI lumbar redemonstrated the osteomyelitis at T11-12 and L5-S1.  Noted to be status post L4-L5 laminectomy and debridement with significant decrease in the amount of the abscess.  A small collection was noted posterior to the S1 causing moderate thecal sac narrowing.  Other findings of iliopsoas abscess and sacroiliac septic arthritis were noted as seen previously on MRI pelvis. ?These findings were discussed with neurosurgery.  Patient does not have any focal neurological deficits.  No intervention was recommended.  Patient able to ambulate and no focal deficits seen. ?-Continue with therapy and  encourage  ambulation as tolerated. Pt refusing lovenox.  ?-Wound care consulted for dressing change.  ?- Physical exam reassuring at this time.  ?  ?Iliopsoas abscess  ?Sacroiliac septic arthritis/osteomyelitis. Pelvic MRI showed bilateral infectious sacroiliitis and osteomyelitis of the sacrum and periarticular right iliac bone. Bilateral psoas abscesses, Small bilateral paraspinal muscle abscesses, intramuscular abscess within the left piriformis muscle, small intramuscular abscess within the left gluteus medius muscle. ?  ?Pleural effusion/Empyema ?-S/P chest tube placement and subsequent removal on 3/29. Stable on room air. ?  ?Normocytic anemia ?-S/p 2 units of PRBC transfusion at an outside facility.  hemoglobin stable around 8.  No signs of bleeding.  ?- Recheck weekly.  ?  ?Anxiety with depression ?- Noted to be on Prozac and Ativan as needed. ?  ?Acute osteomyelitis of clavicle, right: s/p debridement of right clavicle and application of wound VAC on 3/31.  ?- Continue routine wound care.  ?-Orthopedics evaluated her on 10/05/2021: Wound VAC removed on 10/05/2021 by orthopedics.  Wound care as per orthopedics recommendations for  ? ?polysubstance abuse ([place]): Reports last use was 2 years ago. ?- Seems to have firm understanding of the disease of addiction and will pursue community treatment options to maintain recovery. ?- CSW consulted to provide any available resources in regards to relocation in this area from [place].  ?  ?Acute pain: Due to multifocal infections.  ?- Continue fentanyl patch and prn percocet. Will plan to taper gradually as she would be best served by coming off opioids at discharge. Alternative option, if pain continues to be severe (has multiple significant causes of ongoing pain), could transition to methadone if provider in the outpatient setting could be arranged.  ?  ?Transaminitis: Resolved. 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  ?  ?Vaginal discharge: Resolved s/p diflucan  ?   ?Hypotension ?-Resolved with fluid bolus.  ? ? ?DVT prophylaxis: Lovenox ?Code Status: Full ?Family Communication: None at bedside ?Disposition Plan: ?Status is: Inpatient ?Remains inpatient appropriate because: Of need for IV antibiotics till 10/29/2021 as she has history of polysubstance abuse. ? ? ? ?Consultants: Orthopedic/ID/neurosurgery/PCCM/general surgery/IR ? ?Procedures: As above ? ?Antimicrobials:  ?Anti-infectives (From admission, onward)  ? ? Start     Dose/Rate Route Frequency Ordered Stop  ? 10/07/21 0100  vancomycin (VANCOREADY) IVPB 1250 mg/250 mL       ? 1,250 mg ?166.7 mL/hr over 90 Minutes Intravenous Every 12 hours 10/06/21 2358    ? 10/06/21 0000  vancomycin (VANCOREADY) IVPB 1500 mg/300 mL  Status:  Discontinued       ? 1,500 mg ?150 mL/hr over 120 Minutes Intravenous Every 12 hours 10/05/21 1526 10/06/21 2358  ? 10/05/21 1522  vancomycin variable dose per unstable renal function (pharmacist dosing)  Status:  Discontinued       ?  Does not apply See admin instructions 10/05/21 1522 10/05/21 1527  ? 10/01/21 2153  vancomycin (VANCOREADY) IVPB 1500 mg/300 mL  Status:  Discontinued       ? 1,500 mg ?150 mL/hr over 120 Minutes Intravenous Every 12 hours 10/01/21 2154 10/05/21 1522  ? 09/30/21 1100  vancomycin (VANCOREADY) IVPB 750 mg/150 mL  Status:  Discontinued       ? 750 mg ?150 mL/hr over 60 Minutes Intravenous Every 8 hours 09/30/21 1007 10/01/21 2154  ? 09/27/21 1200  vancomycin (VANCOCIN) IVPB 1000 mg/200 mL premix  Status:  Discontinued       ? 1,000 mg ?200 mL/hr over 60 Minutes Intravenous Every 12 hours 09/27/21 1137  09/30/21 1007  ? 09/24/21 1145  fluconazole (DIFLUCAN) tablet 200 mg       ? 200 mg Oral  Once 09/24/21 1059 09/24/21 1410  ? 09/06/21 2200  vancomycin (VANCOREADY) IVPB 1500 mg/300 mL  Status:  Discontinued       ? 1,500 mg ?150 mL/hr over 120 Minutes Intravenous Every 12 hours 09/06/21 1257 09/27/21 1137  ? 09/03/21 1355  vancomycin (VANCOCIN) powder  Status:   Discontinued       ?   As needed 09/03/21 1355 09/03/21 1426  ? 09/01/21 2200  vancomycin (VANCOREADY) IVPB 1750 mg/350 mL  Status:  Discontinued       ? 1,750 mg ?175 mL/hr over 120 Minutes Intravenous Every 12 hours 03/29/

## 2021-10-11 NOTE — Plan of Care (Addendum)
@  1030- Right shoulder and abdominal dressing changed. ? ?Problem: Education: ?Goal: Knowledge of General Education information will improve ?Description: Including pain rating scale, medication(s)/side effects and non-pharmacologic comfort measures ?Outcome: Progressing ?  ?Problem: Activity: ?Goal: Risk for activity intolerance will decrease ?Outcome: Progressing ?  ?Problem: Pain Managment: ?Goal: General experience of comfort will improve ?Outcome: Progressing ?  ?Problem: Safety: ?Goal: Ability to remain free from injury will improve ?Outcome: Progressing ?  ?Problem: Skin Integrity: ?Goal: Risk for impaired skin integrity will decrease ?Outcome: Progressing ?  ?

## 2021-10-11 NOTE — Progress Notes (Signed)
Mobility Specialist Progress Note  ? ? 10/11/21 1722  ?Mobility  ?Activity Ambulated independently in hallway  ?Level of Assistance Standby assist, set-up cues, supervision of patient - no hands on  ?Assistive Device None  ?Distance Ambulated (ft) 650 ft  ?Activity Response Tolerated well  ?$Mobility charge 1 Mobility  ? ?Pt received on couch and agreeable. No complaints on walk. Returned to couch with call bell in reach.   ? ?[employer] [place] ?Mobility Specialist  ?Primary: 5[address] M.S. Phone: [phone] ?Secondary: 6[address] M[address]S. Phone: [phone] ?  ?

## 2021-10-12 DIAGNOSIS — R7881 Bacteremia: Secondary | ICD-10-CM | POA: Diagnosis not present

## 2021-10-12 DIAGNOSIS — B9562 Methicillin resistant Staphylococcus aureus infection as the cause of diseases classified elsewhere: Secondary | ICD-10-CM | POA: Diagnosis not present

## 2021-10-12 MED ORDER — ALPRAZOLAM 0.5 MG PO TABS
0.5000 mg | ORAL_TABLET | Freq: Two times a day (BID) | ORAL | Status: DC | PRN
  Administered 2021-10-12 – 2021-10-29 (×24): 0.5 mg via ORAL
  Filled 2021-10-12 (×24): qty 1

## 2021-10-12 NOTE — Progress Notes (Signed)
Mobility Specialist Progress Note  ? ? 10/12/21 1047  ?Mobility  ?Activity Ambulated independently in hallway  ?Level of Assistance Modified independent, requires aide device or extra time  ?Assistive Device Other (Comment) ?(IV pole)  ?Distance Ambulated (ft) 400 ft  ?Activity Response Tolerated well  ?$Mobility charge 1 Mobility  ? ?Pt received in bed and agreeable. No complaints on walk. Returned to bed with call bell in reach.   ? ?Kimberly Booker ?Mobility Specialist  ?Primary: 5[address] Phone: [phone] ?Secondary: 6[address] M[address]S. Phone: [phone] ?  ?

## 2021-10-12 NOTE — Progress Notes (Signed)
?PROGRESS NOTE ? ? ? ?Kimberly Booker  MRN:[id number] DOB: 01/02/1986 DOA: 08/26/2021 ?PCP: Pcp, No  ? ?Brief Narrative:  ?36 year old with past medical history significant for anxiety, depression, suicidal attempt drug overdose, polysubstance abuse, right clavicle fracture, was admitted at [facility] on 08/15/2021 due to right shoulder abscess.She was treated with I&D in the emergency department followed by IV antibiotics therapy as an inpatient.  She was diagnosed with MRSA bacteremia, endocarditis was ruled out with a TEE performed 3/16, but she continued to have positive blood cultures despite treatment with ceftriaxone and vancomycin.  Antibiotic therapy was changed to daptomycin.  She was then transferred to [place]. ? ?Extensive imagings have been done here which showed right clavicle osteomyelitis/phlegmon/abscess, widespread cervical, thoracic, lumbar discitis/osteomyelitis/epidural abscess/cord impingement.  MRI also showed bilateral iliopsoas abscess, right complex pleural effusion.  ID, orthopedics, neurosurgery following. ?Underwent lumbar laminectomy/debridement of abscess, L4-L5 on 3/25. Hospital course also remarkable for finding of right-sided empyema, gastric perforation. Status post emergent laparotomy and perforation of repair by general surgery.  PCCM followed for chest tube on the right side, which was removed on 3/29. ?Underwent aspiration of the psoas abscesses on 3/30.  ?Underwent debridement of the right clavicle and application of wound VAC to the right shoulder area on 3/31. ?Developed severe lower back pain on 4/2.  MRI was done which showed improvement in the abscess.  Symptoms have improved. ?She is working with PT and progressing well.  ? ?Assessment & Plan: ?  ?MRSA bacteremia ?-Patient has disseminated MRSA bacteremia.  ?-She had TEE at outside facility on 3/16, negative for endocarditis as per the report . TTE did not show any vegetation ?- Per infectious disease  patient will need a total of 8 weeks of antibiotic therapy starting from the day of surgery on 09/03/2021 with an end date on 10/29/2021. Vancomycin continued. Cr stable. Blood cultures cleared as of the draw on 4/9.  ?-Currently afebrile and hemodynamically stable. ?  ?Gastric perforation ?- S/p surgical repair and patient is currently on soft diet and able to tolerate.  ?-General surgery has signed off ? ?Abscess in epidural space of cervical spine ?Discitis/osteomyelitis at C2-3, C5-6, and C6-7. Ventral epidural abscess at C2-3, C5, and C6 with cord impingement especially at the lower 2 levels. Facet arthritis on the left at C7-T1 and right at C3-4. ?-Also showed discitis/osteomyelitis of T9- T10, T11-T12 ?-Was seen by neurosurgery, no focal deficits at this time. ?-Continue with pain management. ?-Patient able to ambulate and participate in therapy at this time. ?  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 Bilateral facet arthritis at L5-S1. Dorsal epidural abscess at L4 and L5,highly compressive on the thecal sac. ?-S/P  lumbar laminectomy, debridement of abscess, L4-L5 on 3/25. ?-No further interventions planned by neurosurgery.   ?-On 4/2 patient was complaining of severe pain in the lower back radiating down to her legs.  MRI lumbar redemonstrated the osteomyelitis at T11-12 and L5-S1.  Noted to be status post L4-L5 laminectomy and debridement with significant decrease in the amount of the abscess.  A small collection was noted posterior to the S1 causing moderate thecal sac narrowing.  Other findings of iliopsoas abscess and sacroiliac septic arthritis were noted as seen previously on MRI pelvis. ?These findings were discussed with neurosurgery.  Patient does not have any focal neurological deficits.  No intervention was recommended.  Patient able to ambulate and no focal deficits seen. ?-Continue with therapy and   encourage  ambulation as tolerated. Pt refusing lovenox.  ?-Wound care consulted for dressing change.  ?- Physical exam reassuring at this time.  ?  ?Iliopsoas abscess  ?Sacroiliac septic arthritis/osteomyelitis. Pelvic MRI showed bilateral infectious sacroiliitis and osteomyelitis of the sacrum and periarticular right iliac bone. Bilateral psoas abscesses, Small bilateral paraspinal muscle abscesses, intramuscular abscess within the left piriformis muscle, small intramuscular abscess within the left gluteus medius muscle. ?  ?Pleural effusion/Empyema ?-S/P chest tube placement and subsequent removal on 3/29. Stable on room air. ?  ?Normocytic anemia ?-S/p 2 units of PRBC transfusion at an outside facility.  hemoglobin stable around 8.  No signs of bleeding.  ?- Recheck weekly.  ?  ?Anxiety with depression ?- Noted to be on Prozac and Xanax as needed.  Will reduce Xanax to twice daily as needed. ?  ?Acute osteomyelitis of clavicle, right: s/p debridement of right clavicle and application of wound VAC on 3/31.  ?- Continue routine wound care.  ?-Orthopedics evaluated her on 10/05/2021: Wound VAC removed on 10/05/2021 by orthopedics.  Wound care as per orthopedics recommendations for  ? ?polysubstance abuse ([facility]): Reports last use was 2 years ago. ?- Seems to have firm understanding of the disease of addiction and will pursue community treatment options to maintain recovery. ?- CSW consulted to provide any available resources in regards to relocation in this area from [place].  ?  ?Acute pain: She is on multiple medications including fentanyl patch, as needed Percocet, Robaxin, Flexeril Due to multifocal infections.  ?Will plan to taper gradually as she would be best served by coming off opioids at discharge. Alternative option, if pain continues to be severe (has multiple significant causes of ongoing pain), could transition to methadone if provider in the outpatient setting could be arranged.  Patient is interested in  methadone as well. ?  ?Transaminitis: Resolved. 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  ?  ?Vaginal discharge: Resolved s/p diflucan  ?  ?Hypotension: Patient has intermittent hypotension.  She has no dizziness but sometimes feels nauseous.  I offered her midodrine which she declined.  I think her blood pressure is low because of being on several muscle relaxants and opioids but those are needed as she is still complaining of intermittent back pain. ? ? ?DVT prophylaxis: Lovenox ?Code Status: Full ?Family Communication: None at bedside ?Disposition Plan: ?Status is: Inpatient ?Remains inpatient appropriate because: Of need for IV antibiotics till 10/29/2021 as she has history of polysubstance abuse. ? ? ? ?Consultants: Orthopedic/ID/neurosurgery/PCCM/general surgery/IR ? ?Procedures: As above ? ?Antimicrobials:  ?Anti-infectives (From admission, onward)  ? ? Start     Dose/Rate Route Frequency Ordered Stop  ? 10/07/21 0100  vancomycin (VANCOREADY) IVPB 1250 mg/250 mL       ? 1,250 mg ?166.7 mL/hr over 90 Minutes Intravenous Every 12 hours 10/06/21 2358    ? 10/06/21 0000  vancomycin (VANCOREADY) IVPB 1500 mg/300 mL  Status:  Discontinued       ? 1,500 mg ?150 mL/hr over 120 Minutes Intravenous Every 12 hours 10/05/21 1526 10/06/21 2358  ? 10/05/21 1522  vancomycin variable dose per unstable renal function (pharmacist dosing)  Status:  Discontinued       ?  Does not apply See admin instructions 10/05/21 1522 10/05/21 1527  ? 10/01/21 2153  vancomycin (VANCOREADY) IVPB 1500 mg/300 mL  Status:  Discontinued       ? 1,500 mg ?150 mL/hr over 120 Minutes Intravenous Every 12 hours 10/01/21 2154 10/05/21 1522  ? 09/30/21 1100  vancomycin (VANCOREADY) IVPB 750 mg/150 mL  Status:  Discontinued       ? 750 mg ?150 mL/hr over 60 Minutes Intravenous Every 8 hours 09/30/21 1007 10/01/21 2154  ? 09/27/21 1200  vancomycin (VANCOCIN) IVPB 1000 mg/200 mL premix  Status:  Discontinued       ? 1,000 mg ?200 mL/hr over 60 Minutes  Intravenous Every 12 hours 09/27/21 1137 09/30/21 1007  ? 09/24/21 1145  fluconazole (DIFLUCAN) tablet 200 mg       ? 200 mg Oral  Once 09/24/21 1059 09/24/21 1410  ? 09/06/21 2200  vancomycin (VANCOREADY) IVPB 1500 m

## 2021-10-13 DIAGNOSIS — B9562 Methicillin resistant Staphylococcus aureus infection as the cause of diseases classified elsewhere: Secondary | ICD-10-CM | POA: Diagnosis not present

## 2021-10-13 DIAGNOSIS — R7881 Bacteremia: Secondary | ICD-10-CM | POA: Diagnosis not present

## 2021-10-13 LAB — VANCOMYCIN, TROUGH: Vancomycin Tr: 12 ug/mL — ABNORMAL LOW (ref 15–20)

## 2021-10-13 LAB — VANCOMYCIN, PEAK: Vancomycin Pk: 35 ug/mL (ref 30–40)

## 2021-10-13 LAB — ACID FAST CULTURE WITH REFLEXED SENSITIVITIES (MYCOBACTERIA): Acid Fast Culture: NEGATIVE

## 2021-10-13 NOTE — Plan of Care (Signed)

## 2021-10-13 NOTE — Progress Notes (Signed)
Pharmacy Antibiotic Note ? ?Kimberly Booker is a 36 y.o. female admitted on 08/26/2021 with  disseminated MRSA bacteremia (diffuse discitis/osteomyelitis/epidural abscess of cervical, thoracic and lumbar spine, bilateral septic arthritis, osteomyelitis of R clavicle, R lung empyema) .  ?  ?Patient was initially started on vancomycin 3/13-3/15 along with ceftriaxone 3/13-3/24. Vancomycin was transitioned to daptomycin and given 3/16-3/25. Pt was transferred to [facility] on 08/26/21. ID has consulted pharmacy dose vancomycin for coverage of MRSA R lung empyema.  ?  ?Patient is s/p placement of IR drains into iliopsoas abscesses 3/30 and debridement of right clavicle 3/31. Scr remains stable.  ? ? ?Tentative end date: 10/29/21 per ID recs (8 wks) ? ?5/10 AM update:  ? ?Vanco level assessment (1250 mg IV q12h) ?Dose given: 5/9 23:04 over 1.5 hours ?Peak drawn: 5/10 01:30 reported (35 mcg/mL) ?Trough drawn: 5/10 09:45 reported(12 mcg/mL) ? ?AUC: 528.9 mcg*hr/mL ?T (1/2): 5.3 hours ?Peak: 39.5 mcg/mL ?Trough: 10.1 mcg/mL ? ?Plan: ? ?Continue Vancomycin to 1250 mg IV every 12 hours ?Monitor renal function  ?Next Peak and trough 10/19/2021 ? ?Height: 5\' 7"  (170.2 cm) ?Weight: 77.1 kg (170 lb) ?IBW/kg (Calculated) : 61.6 ? ?Temp (24hrs), Avg:98.3 ?F (36.8 ?C), Min:98 ?F (36.7 ?C), Max:98.6 ?F (37 ?C) ? ?Recent Labs  ?Lab 10/06/21 ?1627 10/06/21 ?2231 10/07/21 ?0454 10/11/21 ?0353 10/13/21 ?0130 10/13/21 ?0945  ?WBC  --   --  7.0 7.0  --   --   ?CREATININE  --   --  0.36* 0.40*  --   --   ?VANCOTROUGH  --   --   --   --   --  12*  ?VANCOPEAK 37  --   --   --  35  --   ?VANCORANDOM  --  16  --   --   --   --   ? ?  ?Estimated Creatinine Clearance: 104.1 mL/min (A) (by C-G formula based on SCr of 0.4 mg/dL (L)).   ? ?Allergies  ?Allergen Reactions  ? Amoxicillin Swelling  ?  Throat closes  ? ? ?Antimicrobials this admission: ?Vanc 3/13 >> 3/15 ?Ceftriaxone 3/13 >> 3/24 ?Daptomycin 3/16 >> 3/25 ?Vanc 3/26 >> ? ?Microbiology  results: ?3/12 Bcx x2: 4/4 MRSA ?3/12 Chest wound cx: MRSA ?3/14 Bcx: 4/4 MRSA ?3/22 Bcx: ngtd ?3/24 Bcx: ngtd ?3/25 lumbar epidural wound/abscess: MRSA ?3/31 fungal cx: negative ?3/31 bone cx: negative ? ? ?Yuepheng Schaller BS, PharmD, BCPS ?Clinical Pharmacist ?10/13/2021 11:15 AM ? ?Contact: [phone] after 3 PM ? ?"Be curious, not judgmental..." -[phone] ?

## 2021-10-13 NOTE — Progress Notes (Signed)
Nutrition Follow-up ? ?DOCUMENTATION CODES:  ? ?Not applicable ? ?INTERVENTION:  ?Continue Ensure Enlive po TID, each supplement provides 350 kcal and 20 grams of protein. ?  ?Continue MVI once daily.  ? ?NUTRITION DIAGNOSIS:  ? ?Increased nutrient needs related to acute illness, post-op healing, wound healing as evidenced by estimated needs; ongoing ? ?GOAL:  ? ?Patient will meet greater than or equal to 90% of their needs; met ? ?MONITOR:  ? ?PO intake, Supplement acceptance, Labs, Weight trends, Skin, I & O's ? ?REASON FOR ASSESSMENT:  ? ?Consult ?Assessment of nutrition requirement/status ? ?ASSESSMENT:  ? ?36 year old female with medical history of anxiety, depression, suicide attempt by drug OD, polysubstance abuse, R clavicle fx 8 months ago. She presented to the ED ongoing R shoulder pain. She had gone to another hospital 2 weeks ago and found to have R shoulder abscess which was treated with I&D and IV abx. She was dx with MRSA bacteremia and endocarditis was r/o on TEE on 3/16. She was transferred from [facility] to [facility] due to ongoing positive blood cultures despite treatment. Once at [facility], extensive imaging showed R clavicle osteomyelitis/phlegmon/abscess, widespread cervical,thoracic,lumbar discitis/osteomyelitis/epidural abscess/cord impingement. MRI showed bilateral iliopsoas abscess, R complex pleural effusion. ID, Orthopedics, and Neurosurgery following.  Currently on daptomycin. Plan for transthoracic echo. Neurosurgery planning for lumbar laminectomy for debridement of abscess, L4-L5. ? ?Per infectious disease patient will need a total of 8 weeks of antibiotic therapy starting from the day of surgery on 09/03/2021 with an end date on 10/29/2021. ?  ?3/25 - s/p lumbar laminectomy/debridement of abscess, L4-L5 ?3/27 - s/p dx laparoscopy, exploratory laparotomy, graham patch repair of gastric ulcer perforation; NGT placed to suction ?3/30 - NGT removed ?3/31 - s/p debridement R  clavicle and application of woundVAC ?4/02 - MRI spine redemonstrated osteomyelitis at T11-12 and L5-S1 ?5/2 - wound VAC removed ? ?Meal completion has been 100%. Pt has been tolerating her PO diet. Pt currently has Ensure ordered with varied consumption. RD to continue with current orders to aid in caloric and protein needs as well as in wound healing.  ? ?Labs and medications reviewed.  ? ?Diet Order:   ?Diet Order   ? ?       ?  Diet regular Room service appropriate? Yes; Fluid consistency: Thin  Diet effective now       ?  ? ?  ?  ? ?  ? ? ?EDUCATION NEEDS:  ? ?Not appropriate for education at this time ? ?Skin:  Skin Assessment: Reviewed RN Assessment ?Skin Integrity Issues:: Wound VAC ?Wound Vac: N/A ?Incisions: throat, back, abdomen ? ?Last BM:  5/5 ? ?Height:  ? ?Ht Readings from Last 1 Encounters:  ?09/02/21 _0  (1.702 m)  ? ? ?Weight:  ? ?Wt Readings from Last 1 Encounters:  ?09/02/21 77.1 kg  ? ?BMI:  Body mass index is 26.63 kg/m?. ? ?Estimated Nutritional Needs:  ? ?Kcal:  2200-2400 ? ?Protein:  110-125 grams ? ?Fluid:  >/= 2.2 L/day ? ?Corrin Parker, MS, RD, LDN ?RD pager number/after hours weekend pager number on Amion. ? ?

## 2021-10-13 NOTE — Progress Notes (Signed)
Physical Therapy Treatment ?Patient Details ?Name: Kimberly Booker ?MRN: [id number] ?DOB: 1986/02/21 ?Today's Date: 10/13/2021 ? ? ?History of Present Illness Pt is a 36 y.o. female initially admitted to [facility] 08/15/21 with R shoulder abscess; admit to [facility] 08/26/21 with MRSA bacteremia. 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 S/p L4-5 laminectomy and abscess debridement 3/25. Pt with pleural effusion s/p R pigtail chest tube 3/26. Abdominal CT showed bowel perforation; s/p ex lap with patch repair on 3/27. S/p R psoas abscess drainage on 3/30. S/p R clavicle I&D with wound vac placement 3/31. Pt developed severe LBP on 4/2; MRI showed improvement in abscess. R clavicle wound vac removed 5/2. PMH includes anxiety, depression, polysubstance abuse, R clavicle fx ~8 mo prior to admission. ?  ?PT Comments  ? ? Pt progressing with mobility; indep with mobility and ADL tasks. Pt has made great progress towards activity goals this admission. Pt still expresses concerns regarding safe d/c plan, since family is not an option and friend's place is unreliable; have reached out to CM/SW regarding this. Pt has met short-term acute PT goals; reviewed education, pt reports no further questions or concerns. Will d/c acute PT. ?   ?Recommendations for follow up therapy are one component of a multi-disciplinary discharge planning process, led by the attending physician.  Recommendations may be updated based on patient status, additional functional criteria and insurance authorization. ? ?Follow Up Recommendations ? No PT follow up  ?  ?  ?Assistance Recommended at Discharge PRN  ?Patient can return home with the following  Pt without reliable place to stay at d/c; interested in options Medicaid may assist with ?  ?Equipment Recommendations ? None recommended by PT  ?  ?Recommendations for Other Services   ? ? ?  ?Precautions / Restrictions Precautions ?Precautions: Other (comment) ?Precaution Comments:  abdominal precautions for comfort; lumbar corset for comfort (pt declines use) ?Restrictions ?Weight Bearing Restrictions: No  ?  ? ?Mobility ? Bed Mobility ?Overal bed mobility: Independent ?Bed Mobility: Supine to Sit, Sit to Supine ?  ?  ?  ?  ?  ?  ?  ? ?Transfers ?Overall transfer level: Independent ?Equipment used: None ?Transfers: Sit to/from Stand ?  ?  ?  ?  ?  ?  ?  ?  ? ?Ambulation/Gait ?Ambulation/Gait assistance: Independent ?Gait Distance (Feet): 800 Feet ?Assistive device: None ?Gait Pattern/deviations: Step-through pattern, Decreased stride length, Antalgic, Trunk flexed, Decreased dorsiflexion - right ?  ?  ?  ?General Gait Details: Slow, but steady gait indep without DME; noted improvement in RLE gait mechanics, still with some compensatory strategies with increased fatigue ? ? ?Stairs ?  ?  ?  ?  ?  ? ? ?Wheelchair Mobility ?  ? ?Modified Rankin (Stroke Patients Only) ?  ? ? ?  ?Balance Overall balance assessment: Independent ?Sitting-balance support: Feet supported ?Sitting balance-Leahy Scale: Good ?  ?  ?Standing balance support: No upper extremity supported, During functional activity ?Standing balance-Leahy Scale: Good ?  ?  ?  ?  ?  ?  ?  ?High level balance activites: Side stepping, Braiding, Direction changes, Turns, Sudden stops, Head turns ?High Level Balance Comments: no overt instability or LOB with higher level balance tasks; still with some guarded movement ?  ?  ?  ?  ? ?  ?Cognition Arousal/Alertness: Awake/alert ?Behavior During Therapy: [facility] for tasks assessed/performed ?Overall Cognitive Status: Within Functional Limits for tasks assessed ?  ?  ?  ?  ?  ?  ?  ?  ?  ?  ?  ?  ?  ?  ?  ?  ?  ?  ?  ? ?  ?  Exercises Other Exercises ?Other Exercises: pt reports no concerns with HEP/therex ? ?  ?General Comments General comments (skin integrity, edema, etc.): pt still without a reliable d/c plan - endorses dad and mom's home are not options; has a friend, but worried this is not ideal  environment with her recovery. will reach out to SW/CM regarding potential options ?  ?  ? ?Pertinent Vitals/Pain Pain Assessment ?Pain Assessment: Faces ?Faces Pain Scale: Hurts a little bit ?Pain Location: back ?Pain Descriptors / Indicators: Sore ?Pain Intervention(s): Monitored during session, Limited activity within patient's tolerance  ? ? ?Home Living   ?  ?  ?  ?  ?  ?  ?  ?  ?  ?   ?  ?Prior Function    ?  ?  ?   ? ?PT Goals (current goals can now be found in the care plan section) Progress towards PT goals: Goals met/education completed, patient discharged from PT ? ?  ?Frequency ? ? ? Min 2X/week ? ? ? ?  ?PT Plan Current plan remains appropriate  ? ? ?Co-evaluation   ?  ?  ?  ?  ? ?  ?AM-PAC PT "6 Clicks" Mobility   ?Outcome Measure ? Help needed turning from your back to your side while in a flat bed without using bedrails?: None ?Help needed moving from lying on your back to sitting on the side of a flat bed without using bedrails?: None ?Help needed moving to and from a bed to a chair (including a wheelchair)?: None ?Help needed standing up from a chair using your arms (e.g., wheelchair or bedside chair)?: None ?Help needed to walk in hospital room?: None ?Help needed climbing 3-5 steps with a railing? : None ?6 Click Score: 24 ? ?  ?End of Session   ?Activity Tolerance: Patient tolerated treatment well ?Patient left: in bed;with call bell/phone within reach ?Nurse Communication: Mobility status ?PT Visit Diagnosis: Other abnormalities of gait and mobility (R26.89);Muscle weakness (generalized) (M62.81);Pain ?Pain - Right/Left: Right ?Pain - part of body: Shoulder ?  ? ? ?Time: 4268-3419 ?PT Time Calculation (min) (ACUTE ONLY): 22 min ? ?Charges:  $Self Care/Home Management: 8-22          ?          ? ?Mabeline Caras, PT, DPT ?Acute Rehabilitation Services  ?Pager [phone] ?Office [phone] ? ?Derry Lory ?10/13/2021, 3:07 PM ? ?

## 2021-10-13 NOTE — Progress Notes (Signed)
?PROGRESS NOTE ? ? ? ?Kimberly Booker  MRN:[id number] DOB: 12/11/1985 DOA: 08/26/2021 ?PCP: Pcp, No  ? ?Brief Narrative:  ?36 year old with past medical history significant for anxiety, depression, suicidal attempt drug overdose, polysubstance abuse, right clavicle fracture, was admitted at [facility] on 08/15/2021 due to right shoulder abscess.She was treated with I&D in the emergency department followed by IV antibiotics therapy as an inpatient.  She was diagnosed with MRSA bacteremia, endocarditis was ruled out with a TEE performed 3/16, but she continued to have positive blood cultures despite treatment with ceftriaxone and vancomycin.  Antibiotic therapy was changed to daptomycin.  She was then transferred to [place]. ? ?Extensive imagings have been done here which showed right clavicle osteomyelitis/phlegmon/abscess, widespread cervical, thoracic, lumbar discitis/osteomyelitis/epidural abscess/cord impingement.  MRI also showed bilateral iliopsoas abscess, right complex pleural effusion.  ID, orthopedics, neurosurgery following. ?Underwent lumbar laminectomy/debridement of abscess, L4-L5 on 3/25. Hospital course also remarkable for finding of right-sided empyema, gastric perforation. Status post emergent laparotomy and perforation of repair by general surgery.  PCCM followed for chest tube on the right side, which was removed on 3/29. ?Underwent aspiration of the psoas abscesses on 3/30.  ?Underwent debridement of the right clavicle and application of wound VAC to the right shoulder area on 3/31. ?Developed severe lower back pain on 4/2.  MRI was done which showed improvement in the abscess.  Symptoms have improved. ?She is working with PT and progressing well.  ? ?Assessment & Plan: ?  ?MRSA bacteremia ?-Patient has disseminated MRSA bacteremia.  ?-She had TEE at outside facility on 3/16, negative for endocarditis as per the report . TTE did not show any vegetation ?- Per infectious disease  patient will need a total of 8 weeks of antibiotic therapy starting from the day of surgery on 09/03/2021 with an end date on 10/29/2021. Vancomycin continued. Cr stable. Blood cultures cleared as of the draw on 4/9.  ?-Currently afebrile and hemodynamically stable. ?  ?Gastric perforation ?- S/p surgical repair and patient is currently on soft diet and able to tolerate.  ?-General surgery has signed off ? ?Abscess in epidural space of cervical spine ?Discitis/osteomyelitis at C2-3, C5-6, and C6-7. Ventral epidural abscess at C2-3, C5, and C6 with cord impingement especially at the lower 2 levels. Facet arthritis on the left at C7-T1 and right at C3-4. ?-Also showed discitis/osteomyelitis of T9- T10, T11-T12 ?-Was seen by neurosurgery, no focal deficits at this time. ?-Continue with pain management. ?-Patient able to ambulate and participate in therapy at this time. ?  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 Bilateral facet arthritis at L5-S1. Dorsal epidural abscess at L4 and L5,highly compressive on the thecal sac. ?-S/P  lumbar laminectomy, debridement of abscess, L4-L5 on 3/25. ?-No further interventions planned by neurosurgery.   ?-On 4/2 patient was complaining of severe pain in the lower back radiating down to her legs.  MRI lumbar redemonstrated the osteomyelitis at T11-12 and L5-S1.  Noted to be status post L4-L5 laminectomy and debridement with significant decrease in the amount of the abscess.  A small collection was noted posterior to the S1 causing moderate thecal sac narrowing.  Other findings of iliopsoas abscess and sacroiliac septic arthritis were noted as seen previously on MRI pelvis. ?These findings were discussed with neurosurgery.  Patient does not have any focal neurological deficits.  No intervention was recommended.  Patient able to ambulate and no focal deficits seen. ?-Continue with therapy and   encourage  ambulation as tolerated. Pt refusing lovenox.  ?-Wound care consulted for dressing change.  ?- Physical exam reassuring at this time.  ?  ?Iliopsoas abscess  ?Sacroiliac septic arthritis/osteomyelitis. Pelvic MRI showed bilateral infectious sacroiliitis and osteomyelitis of the sacrum and periarticular right iliac bone. Bilateral psoas abscesses, Small bilateral paraspinal muscle abscesses, intramuscular abscess within the left piriformis muscle, small intramuscular abscess within the left gluteus medius muscle. ?  ?Pleural effusion/Empyema ?-S/P chest tube placement and subsequent removal on 3/29. Stable on room air. ?  ?Normocytic anemia ?-S/p 2 units of PRBC transfusion at an outside facility.  hemoglobin stable around 8.  No signs of bleeding.  ?- Recheck weekly.  ?  ?Anxiety with depression ?- Noted to be on Prozac and Xanax as needed.  Will reduce Xanax to twice daily as needed. ?  ?Acute osteomyelitis of clavicle, right: s/p debridement of right clavicle and application of wound VAC on 3/31.  ?- Continue routine wound care.  ?-Orthopedics evaluated her on 10/05/2021: Wound VAC removed on 10/05/2021 by orthopedics.  Wound care as per orthopedics recommendations for  ? ?polysubstance abuse (HCC): Reports last use was 2 years ago. ?- Seems to have firm understanding of the disease of addiction and will pursue community treatment options to maintain recovery. ?- CSW consulted to provide any available resources in regards to relocation in this area from [place].  ?  ?Acute pain: She is on multiple medications including fentanyl patch, as needed Percocet, Robaxin, Flexeril Due to multifocal infections.  ?Will plan to taper gradually as she would be best served by coming off opioids at discharge. Alternative option, if pain continues to be severe (has multiple significant causes of ongoing pain), could transition to methadone if provider in the outpatient setting could be arranged.  Patient is interested in  methadone as well. ?  ?Transaminitis: Resolved. 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  ?  ?Vaginal discharge: Resolved s/p diflucan  ?  ?Hypotension: Patient has intermittent hypotension.  She has no dizziness but sometimes feels nauseous.  I offered her midodrine on 10/12/2021 which she declined.  I think her blood pressure is low because of being on several muscle relaxants and opioids but those are needed as she is still complaining of intermittent back pain. ? ?DVT prophylaxis: Lovenox ?Code Status: Full ?Family Communication: None at bedside ?Disposition Plan: ?Status is: Inpatient ?Remains inpatient appropriate because: Of need for IV antibiotics till 10/29/2021 as she has history of polysubstance abuse. ? ?Consultants: Orthopedic/ID/neurosurgery/PCCM/general surgery/IR ? ?Procedures: As above ? ?Antimicrobials:  ?Anti-infectives (From admission, onward)  ? ? Start     Dose/Rate Route Frequency Ordered Stop  ? 10/07/21 0100  vancomycin (VANCOREADY) IVPB 1250 mg/250 mL       ? 1,250 mg ?166.7 mL/hr over 90 Minutes Intravenous Every 12 hours 10/06/21 2358    ? 10/06/21 0000  vancomycin (VANCOREADY) IVPB 1500 mg/300 mL  Status:  Discontinued       ? 1,500 mg ?150 mL/hr over 120 Minutes Intravenous Every 12 hours 10/05/21 1526 10/06/21 2358  ? 10/05/21 1522  vancomycin variable dose per unstable renal function (pharmacist dosing)  Status:  Discontinued       ?  Does not apply See admin instructions 10/05/21 1522 10/05/21 1527  ? 10/01/21 2153  vancomycin (VANCOREADY) IVPB 1500 mg/300 mL  Status:  Discontinued       ? 1,500 mg ?150 mL/hr over 120 Minutes Intravenous Every 12 hours 10/01/21 2154 10/05/21 1522  ? 09/30/21 1100  vancomycin (  VANCOREADY) IVPB 750 mg/150 mL  Status:  Discontinued       ? 750 mg ?150 mL/hr over 60 Minutes Intravenous Every 8 hours 09/30/21 1007 10/01/21 2154  ? 09/27/21 1200  vancomycin (VANCOCIN) IVPB 1000 mg/200 mL premix  Status:  Discontinued       ? 1,000 mg ?200 mL/hr over 60  Minutes Intravenous Every 12 hours 09/27/21 1137 09/30/21 1007  ? 09/24/21 1145  fluconazole (DIFLUCAN) tablet 200 mg       ? 200 mg Oral  Once 09/24/21 1059 09/24/21 1410  ? 09/06/21 2200  vancomycin (VANCOREADY) IVPB

## 2021-10-14 DIAGNOSIS — B9562 Methicillin resistant Staphylococcus aureus infection as the cause of diseases classified elsewhere: Secondary | ICD-10-CM | POA: Diagnosis not present

## 2021-10-14 DIAGNOSIS — R7881 Bacteremia: Secondary | ICD-10-CM | POA: Diagnosis not present

## 2021-10-14 LAB — CBC WITH DIFFERENTIAL/PLATELET
Abs Immature Granulocytes: 0.03 10*3/uL (ref 0.00–0.07)
Basophils Absolute: 0.1 10*3/uL (ref 0.0–0.1)
Basophils Relative: 1 %
Eosinophils Absolute: 0.3 10*3/uL (ref 0.0–0.5)
Eosinophils Relative: 5 %
HCT: 24.6 % — ABNORMAL LOW (ref 36.0–46.0)
Hemoglobin: 7.9 g/dL — ABNORMAL LOW (ref 12.0–15.0)
Immature Granulocytes: 1 %
Lymphocytes Relative: 38 %
Lymphs Abs: 2.2 10*3/uL (ref 0.7–4.0)
MCH: 27.9 pg (ref 26.0–34.0)
MCHC: 32.1 g/dL (ref 30.0–36.0)
MCV: 86.9 fL (ref 80.0–100.0)
Monocytes Absolute: 0.4 10*3/uL (ref 0.1–1.0)
Monocytes Relative: 7 %
Neutro Abs: 2.8 10*3/uL (ref 1.7–7.7)
Neutrophils Relative %: 48 %
Platelets: 346 10*3/uL (ref 150–400)
RBC: 2.83 MIL/uL — ABNORMAL LOW (ref 3.87–5.11)
RDW: 14 % (ref 11.5–15.5)
WBC: 5.8 10*3/uL (ref 4.0–10.5)
nRBC: 0 % (ref 0.0–0.2)

## 2021-10-14 LAB — BASIC METABOLIC PANEL
Anion gap: 5 (ref 5–15)
BUN: 8 mg/dL (ref 6–20)
CO2: 26 mmol/L (ref 22–32)
Calcium: 8.8 mg/dL — ABNORMAL LOW (ref 8.9–10.3)
Chloride: 108 mmol/L (ref 98–111)
Creatinine, Ser: 0.45 mg/dL (ref 0.44–1.00)
GFR, Estimated: >60 mL/min (ref >60)
Glucose, Bld: 103 mg/dL — ABNORMAL HIGH (ref 70–99)
Potassium: 3.8 mmol/L (ref 3.5–5.1)
Sodium: 139 mmol/L (ref 135–145)

## 2021-10-14 NOTE — Progress Notes (Signed)
OT Cancellation Note ? ?Patient Details ?Name: ZANARI EISENSTEIN ?MRN: [id number] ?DOB: August 15, 1985 ? ? ?Cancelled Treatment:    Reason Eval/Treat Not Completed: Pain limiting ability to participate;Patient declined, no reason specified Pt cleared for UE exercises but reports pain level too high to participate with UE HEP at this time. Pt requests OT to follow-up tomorrow afternoon pending pain improvements. ? ?Layla Maw ?10/14/2021, 2:45 PM ?

## 2021-10-14 NOTE — Plan of Care (Signed)

## 2021-10-14 NOTE — Progress Notes (Signed)
?PROGRESS NOTE ? ? ? ?Kimberly Booker  MRN:[id number] DOB: 11/10/1985 DOA: 08/26/2021 ?PCP: Pcp, No  ? ?Brief Narrative:  ?36 year old with past medical history significant for anxiety, depression, suicidal attempt drug overdose, polysubstance abuse, right clavicle fracture, was admitted at [facility] on 08/15/2021 due to right shoulder abscess.She was treated with I&D in the emergency department followed by IV antibiotics therapy as an inpatient.  She was diagnosed with MRSA bacteremia, endocarditis was ruled out with a TEE performed 3/16, but she continued to have positive blood cultures despite treatment with ceftriaxone and vancomycin.  Antibiotic therapy was changed to daptomycin.  She was then transferred to [place]. ? ?Extensive imagings have been done here which showed right clavicle osteomyelitis/phlegmon/abscess, widespread cervical, thoracic, lumbar discitis/osteomyelitis/epidural abscess/cord impingement.  MRI also showed bilateral iliopsoas abscess, right complex pleural effusion.  ID, orthopedics, neurosurgery following. ?Underwent lumbar laminectomy/debridement of abscess, L4-L5 on 3/25. Hospital course also remarkable for finding of right-sided empyema, gastric perforation. Status post emergent laparotomy and perforation of repair by general surgery.  PCCM followed for chest tube on the right side, which was removed on 3/29. ?Underwent aspiration of the psoas abscesses on 3/30.  ?Underwent debridement of the right clavicle and application of wound VAC to the right shoulder area on 3/31. ?Developed severe lower back pain on 4/2.  MRI was done which showed improvement in the abscess.  Symptoms have improved. ?She is working with PT and progressing well.  ? ?Assessment & Plan: ?  ?MRSA bacteremia ?-Patient has disseminated MRSA bacteremia.  ?-She had TEE at outside facility on 3/16, negative for endocarditis as per the report . TTE did not show any vegetation ?- Per infectious disease  patient will need a total of 8 weeks of antibiotic therapy starting from the day of surgery on 09/03/2021 with an end date on 10/29/2021. Vancomycin continued. Cr stable. Blood cultures cleared as of the draw on 4/9.  ?-Currently afebrile and hemodynamically stable. ?  ?Gastric perforation ?- S/p surgical repair and patient is currently on soft diet and able to tolerate.  ?-General surgery has signed off ? ?Abscess in epidural space of cervical spine ?Discitis/osteomyelitis at C2-3, C5-6, and C6-7. Ventral epidural abscess at C2-3, C5, and C6 with cord impingement especially at the lower 2 levels. Facet arthritis on the left at C7-T1 and right at C3-4. ?-Also showed discitis/osteomyelitis of T9- T10, T11-T12 ?-Was seen by neurosurgery, no focal deficits at this time. ?-Continue with pain management. ?-Patient able to ambulate and participate in therapy at this time. ?  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 Bilateral facet arthritis at L5-S1. Dorsal epidural abscess at L4 and L5,highly compressive on the thecal sac. ?-S/P  lumbar laminectomy, debridement of abscess, L4-L5 on 3/25. ?-No further interventions planned by neurosurgery.   ?-On 4/2 patient was complaining of severe pain in the lower back radiating down to her legs.  MRI lumbar redemonstrated the osteomyelitis at T11-12 and L5-S1.  Noted to be status post L4-L5 laminectomy and debridement with significant decrease in the amount of the abscess.  A small collection was noted posterior to the S1 causing moderate thecal sac narrowing.  Other findings of iliopsoas abscess and sacroiliac septic arthritis were noted as seen previously on MRI pelvis. ?These findings were discussed with neurosurgery.  Patient does not have any focal neurological deficits.  No intervention was recommended.  Patient able to ambulate and no focal deficits seen. ?-Continue with therapy and   encourage  ambulation as tolerated. Pt refusing lovenox.  ?-Wound care consulted for dressing change.  ?- Physical exam reassuring at this time.  ?  ?Iliopsoas abscess  ?Sacroiliac septic arthritis/osteomyelitis. Pelvic MRI showed bilateral infectious sacroiliitis and osteomyelitis of the sacrum and periarticular right iliac bone. Bilateral psoas abscesses, Small bilateral paraspinal muscle abscesses, intramuscular abscess within the left piriformis muscle, small intramuscular abscess within the left gluteus medius muscle. ?  ?Pleural effusion/Empyema ?-S/P chest tube placement and subsequent removal on 3/29. Stable on room air. ?  ?Normocytic anemia ?-S/p 2 units of PRBC transfusion at an outside facility.  hemoglobin stable around 8.  No signs of bleeding.  ?- Recheck weekly.  ?  ?Anxiety with depression ?- Noted to be on Prozac and Xanax as needed.  Will reduce Xanax to twice daily as needed. ?  ?Acute osteomyelitis of clavicle, right: s/p debridement of right clavicle and application of wound VAC on 3/31.  ?- Continue routine wound care.  ?-Orthopedics evaluated her on 10/05/2021: Wound VAC removed on 10/05/2021 by orthopedics.  Wound care as per orthopedics recommendations for  ? ?polysubstance abuse (HCC): Reports last use was 2 years ago. ?- Seems to have firm understanding of the disease of addiction and will pursue community treatment options to maintain recovery. ?- CSW consulted to provide any available resources in regards to relocation in this area from [place].  ?  ?Acute pain: She is on multiple medications including fentanyl patch, as needed Percocet, Robaxin, Flexeril Due to multifocal infections.  ?Will plan to taper gradually as she would be best served by coming off opioids at discharge. Alternative option, if pain continues to be severe (has multiple significant causes of ongoing pain), could transition to methadone if provider in the outpatient setting could be arranged.  Patient is interested in  methadone as well. ?  ?Transaminitis: Resolved. 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  ?  ?Vaginal discharge: Resolved s/p diflucan  ?  ?Hypotension: Patient has intermittent hypotension.  She has no dizziness but sometimes feels nauseous.  I offered her midodrine on 10/12/2021 which she declined.  I think her blood pressure is low because of being on several muscle relaxants and opioids but those are needed as she is still complaining of intermittent back pain. ? ?DVT prophylaxis: Lovenox ?Code Status: Full ?Family Communication: None at bedside ?Disposition Plan: ?Status is: Inpatient ?Remains inpatient appropriate because: Of need for IV antibiotics till 10/29/2021 as she has history of polysubstance abuse. ? ?Consultants: Orthopedic/ID/neurosurgery/PCCM/general surgery/IR ? ?Procedures: As above ? ?Antimicrobials:  ?Anti-infectives (From admission, onward)  ? ? Start     Dose/Rate Route Frequency Ordered Stop  ? 10/07/21 0100  vancomycin (VANCOREADY) IVPB 1250 mg/250 mL       ? 1,250 mg ?166.7 mL/hr over 90 Minutes Intravenous Every 12 hours 10/06/21 2358    ? 10/06/21 0000  vancomycin (VANCOREADY) IVPB 1500 mg/300 mL  Status:  Discontinued       ? 1,500 mg ?150 mL/hr over 120 Minutes Intravenous Every 12 hours 10/05/21 1526 10/06/21 2358  ? 10/05/21 1522  vancomycin variable dose per unstable renal function (pharmacist dosing)  Status:  Discontinued       ?  Does not apply See admin instructions 10/05/21 1522 10/05/21 1527  ? 10/01/21 2153  vancomycin (VANCOREADY) IVPB 1500 mg/300 mL  Status:  Discontinued       ? 1,500 mg ?150 mL/hr over 120 Minutes Intravenous Every 12 hours 10/01/21 2154 10/05/21 1522  ? 09/30/21 1100  vancomycin (  VANCOREADY) IVPB 750 mg/150 mL  Status:  Discontinued       ? 750 mg ?150 mL/hr over 60 Minutes Intravenous Every 8 hours 09/30/21 1007 10/01/21 2154  ? 09/27/21 1200  vancomycin (VANCOCIN) IVPB 1000 mg/200 mL premix  Status:  Discontinued       ? 1,000 mg ?200 mL/hr over 60  Minutes Intravenous Every 12 hours 09/27/21 1137 09/30/21 1007  ? 09/24/21 1145  fluconazole (DIFLUCAN) tablet 200 mg       ? 200 mg Oral  Once 09/24/21 1059 09/24/21 1410  ? 09/06/21 2200  vancomycin (VANCOREADY) IVPB

## 2021-10-14 NOTE — Progress Notes (Signed)
Mobility Specialist Progress Note  ? ? 10/14/21 1641  ?Mobility  ?Activity Refused mobility  ? ?Pt wanting to rest and looking slightly sweaty. Will f/u as schedule permits.  ? ?[place] ?Mobility Specialist  ?Primary: [address] Phone: [phone] ?Secondary: [address] Phone: [phone] ?  ?

## 2021-10-15 NOTE — Progress Notes (Signed)
Occupational Therapy Treatment/Discharge ?Patient Details ?Name: Kimberly Booker ?MRN: [id number] ?DOB: 1986-03-15 ?Today's Date: 10/15/2021 ? ? ?History of present illness Pt is a 36 y.o. female initially admitted to [facility] 08/15/21 with R shoulder abscess; admit to [facility] 08/26/21 with MRSA bacteremia. 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 S/p L4-5 laminectomy and abscess debridement 3/25. Pt with pleural effusion s/p R pigtail chest tube 3/26. Abdominal CT showed bowel perforation; s/p ex lap with patch repair on 3/27. S/p R psoas abscess drainage on 3/30. S/p R clavicle I&D with wound vac placement 3/31. Pt developed severe LBP on 4/2; MRI showed improvement in abscess. R clavicle wound vac removed 5/2. PMH includes anxiety, depression, polysubstance abuse, R clavicle fx ~8 mo prior to admission. ?  ?OT comments ? Pt with pain well controlled today, eager to participate in UE exercises. Educated pt on UE HEP with light theraband and excellent carryover. Encouraged pt to complete exercises daily, progressing to heavier resistance bands (provided) within tolerance to maximize strength. Pt has been completing ADLs/IADLs within the room Independently and denies concerns. No further skilled OT services needed at acute level. Recommend continued frequent out of room mobility with mobility specialists. OT to sign off.  ? ?Recommendations for follow up therapy are one component of a multi-disciplinary discharge planning process, led by the attending physician.  Recommendations may be updated based on patient status, additional functional criteria and insurance authorization. ?   ?Follow Up Recommendations ? No OT follow up  ?  ?Assistance Recommended at Discharge None  ?Patient can return home with the following ?   ?  ?Equipment Recommendations ? None recommended by OT  ?  ?Recommendations for Other Services   ? ?  ?Precautions / Restrictions Precautions ?Precautions: Other  (comment) ?Precaution Comments: abdominal precautions for comfort; lumbar corset for comfort (pt declines use) ?Required Braces or Orthoses: Spinal Brace ?Spinal Brace: Lumbar corset ?Restrictions ?Weight Bearing Restrictions: No ?RUE Weight Bearing: Weight bearing as tolerated ?Other Position/Activity Restrictions: okay for resistance to R UE  ? ? ?  ? ?Mobility Bed Mobility ?Overal bed mobility: Independent ?  ?  ?  ?  ?  ?  ?  ?  ? ?Transfers ?Overall transfer level: Independent ?Equipment used: None ?  ?  ?  ?  ?  ?  ?  ?  ?  ?  ?Balance Overall balance assessment: Independent ?  ?  ?  ?  ?  ?  ?  ?  ?  ?  ?  ?  ?  ?  ?  ?  ?  ?  ?   ? ?ADL either performed or assessed with clinical judgement  ? ?ADL Overall ADL's : Independent ?  ?  ?  ?  ?  ?  ?  ?  ?  ?  ?  ?  ?  ?  ?  ?  ?  ?  ?  ?General ADL Comments: Did a complete wash up this AM, including shaving legs. Endorses being tired from this but able to manage. Pt reports taking advantage of the times when she is unhooked from IV so she can complete ADLs/IADLs without limitations ?  ? ?Extremity/Trunk Assessment Upper Extremity Assessment ?Upper Extremity Assessment: Overall WFL for tasks assessed ?RUE Deficits / Details: WFL ?LUE Deficits / Details: WFL ?  ?Lower Extremity Assessment ?Lower Extremity Assessment: Defer to PT evaluation ?  ?  ?  ? ?Vision   ?Vision Assessment?: No apparent  visual deficits ?  ?Perception   ?  ?Praxis   ?  ? ?Cognition Arousal/Alertness: Awake/alert ?Behavior During Therapy: [facility] for tasks assessed/performed ?Overall Cognitive Status: Within Functional Limits for tasks assessed ?  ?  ?  ?  ?  ?  ?  ?  ?  ?  ?  ?  ?  ?  ?  ?  ?  ?  ?  ?   ?Exercises Exercises: General Upper Extremity ?General Exercises - Upper Extremity ?Shoulder Flexion: Strengthening, Both, 10 reps, Seated, Theraband ?Theraband Level (Shoulder Flexion): Level 1 (Yellow) ?Shoulder Horizontal ABduction: Strengthening, Both, 10 reps, Seated, Theraband ?Theraband Level  (Shoulder Horizontal Abduction): Level 1 (Yellow) ?Elbow Flexion: Strengthening, Both, 10 reps, Seated, Theraband ?Theraband Level (Elbow Flexion): Level 1 (Yellow) ?Elbow Extension: Strengthening, Both, 10 reps, Seated, Theraband ?Theraband Level (Elbow Extension): Level 1 (Yellow) ? ?  ?Shoulder Instructions   ? ? ?  ?General Comments    ? ? ?Pertinent Vitals/ Pain       Pain Assessment ?Pain Assessment: Faces ?Faces Pain Scale: Hurts a little bit ?Pain Location: back, R shoulder area ?Pain Descriptors / Indicators: Sore ?Pain Intervention(s): Monitored during session ? ?Home Living   ?  ?  ?  ?  ?  ?  ?  ?  ?  ?  ?  ?  ?  ?  ?  ?  ?  ?  ? ?  ?Prior Functioning/Environment    ?  ?  ?  ?   ? ?Frequency ? Min 2X/week  ? ? ? ? ?  ?Progress Toward Goals ? ?OT Goals(current goals can now be found in the care plan section) ? Progress towards OT goals: Goals met/education completed, patient discharged from OT ? ?Acute Rehab OT Goals ?Patient Stated Goal: go home soon, excited to get home when antibiotics done ?OT Goal Formulation: All assessment and education complete, DC therapy  ?Plan All goals met and education completed, patient discharged from OT services   ? ?Co-evaluation ? ? ?   ?  ?  ?  ?  ? ?  ?AM-PAC OT "6 Clicks" Daily Activity     ?Outcome Measure ? ? Help from another person eating meals?: None ?Help from another person taking care of personal grooming?: None ?Help from another person toileting, which includes using toliet, bedpan, or urinal?: None ?Help from another person bathing (including washing, rinsing, drying)?: None ?Help from another person to put on and taking off regular upper body clothing?: None ?Help from another person to put on and taking off regular lower body clothing?: None ?6 Click Score: 24 ? ?  ?End of Session   ? ?OT Visit Diagnosis: Unsteadiness on feet (R26.81);Other abnormalities of gait and mobility (R26.89);Muscle weakness (generalized) (M62.81);Pain ?  ?Activity Tolerance  Patient tolerated treatment well ?  ?Patient Left in bed;with call bell/phone within reach ?  ?Nurse Communication Mobility status ?  ? ?   ? ?Time: 0258-5277 ?OT Time Calculation (min): 26 min ? ?Charges: OT General Charges ?$OT Visit: 1 Visit ?OT Treatments ?$Self Care/Home Management : 8-22 mins ?$Therapeutic Exercise: 8-22 mins ? ?Malachy Chamber, OTR/L ?Acute Rehab Services ?Office: [phone]  ? ?Layla Maw ?10/15/2021, 2:11 PM ?

## 2021-10-15 NOTE — Plan of Care (Signed)
  Problem: Clinical Measurements: Goal: Will remain free from infection Outcome: Not Progressing   Problem: Elimination: Goal: Will not experience complications related to bowel motility Outcome: Not Progressing   Problem: Pain Managment: Goal: General experience of comfort will improve Outcome: Not Progressing   

## 2021-10-15 NOTE — Plan of Care (Signed)

## 2021-10-15 NOTE — Progress Notes (Signed)
Mobility Specialist Progress Note  ? ? 10/15/21 1539  ?Mobility  ?Activity Ambulated independently in hallway  ?Level of Assistance Modified independent, requires aide device or extra time  ?Assistive Device Other (Comment) ?(IV pole)  ?Distance Ambulated (ft) 1000 ft  ?Activity Response Tolerated well  ?$Mobility charge 1 Mobility  ? ?Pt received in bed and agreeable. Went to the courtyard outside of [employer]. Took an extended seated rest break. Returned to couch.  ? ?Viola Nation ?Mobility Specialist  ?Primary: [address] Phone: [phone] ?Secondary: [address] Phone: [phone] ?  ?

## 2021-10-15 NOTE — Progress Notes (Signed)
?PROGRESS NOTE ? ? ? ?Kimberly Booker  [id number] DOB: 1986-01-23 DOA: 08/26/2021 ?PCP: Pcp, No  ? ?Brief Narrative:  ?36 year old with past medical history significant for anxiety, depression, suicidal attempt drug overdose, polysubstance abuse, right clavicle fracture, was admitted at [facility] on 08/15/2021 due to right shoulder abscess.She was treated with I&D in the emergency department followed by IV antibiotics therapy as an inpatient.  She was diagnosed with MRSA bacteremia, endocarditis was ruled out with a TEE performed 3/16, but she continued to have positive blood cultures despite treatment with ceftriaxone and vancomycin.  Antibiotic therapy was changed to daptomycin.  She was then transferred to [facility]. ? ?Extensive imagings have been done here which showed right clavicle osteomyelitis/phlegmon/abscess, widespread cervical, thoracic, lumbar discitis/osteomyelitis/epidural abscess/cord impingement.  MRI also showed bilateral iliopsoas abscess, right complex pleural effusion.  ID, orthopedics, neurosurgery following. ?Underwent lumbar laminectomy/debridement of abscess, L4-L5 on 3/25. Hospital course also remarkable for finding of right-sided empyema, gastric perforation. Status post emergent laparotomy and perforation of repair by general surgery.  PCCM followed for chest tube on the right side, which was removed on 3/29. ?Underwent aspiration of the psoas abscesses on 3/30.  ?Underwent debridement of the right clavicle and application of wound VAC to the right shoulder area on 3/31. ?Developed severe lower back pain on 4/2.  MRI was done which showed improvement in the abscess.  Symptoms have improved. ?She is working with PT and progressing well.  ? ?Assessment & Plan: ?  ?MRSA bacteremia ?-Patient has disseminated MRSA bacteremia.  ?-She had TEE at outside facility on 3/16, negative for endocarditis as per the report . TTE did not show any vegetation ?- Per infectious disease  patient will need a total of 8 weeks of antibiotic therapy starting from the day of surgery on 09/03/2021 with an end date on 10/29/2021. Vancomycin continued. Cr stable. Blood cultures cleared as of the draw on 4/9.  ?-Currently afebrile and hemodynamically stable. ?  ?Gastric perforation ?- S/p surgical repair and patient is currently on soft diet and able to tolerate.  ?-General surgery has signed off ? ?Abscess in epidural space of cervical spine ?Discitis/osteomyelitis at C2-3, C5-6, and C6-7. Ventral epidural abscess at C2-3, C5, and C6 with cord impingement especially at the lower 2 levels. Facet arthritis on the left at C7-T1 and right at C3-4. ?-Also showed discitis/osteomyelitis of T9- T10, T11-T12 ?-Was seen by neurosurgery, no focal deficits at this time. ?-Continue with pain management. ?-Patient able to ambulate and participate in therapy at this time. ?  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 Bilateral facet arthritis at L5-S1. Dorsal epidural abscess at L4 and L5,highly compressive on the thecal sac. ?-S/P  lumbar laminectomy, debridement of abscess, L4-L5 on 3/25. ?-No further interventions planned by neurosurgery.   ?-On 4/2 patient was complaining of severe pain in the lower back radiating down to her legs.  MRI lumbar redemonstrated the osteomyelitis at T11-12 and L5-S1.  Noted to be status post L4-L5 laminectomy and debridement with significant decrease in the amount of the abscess.  A small collection was noted posterior to the S1 causing moderate thecal sac narrowing.  Other findings of iliopsoas abscess and sacroiliac septic arthritis were noted as seen previously on MRI pelvis. ?These findings were discussed with neurosurgery.  Patient does not have any focal neurological deficits.  No intervention was recommended.  Patient able to ambulate and no focal deficits seen. ?-Continue with therapy and  encourage  ambulation as tolerated. Pt refusing lovenox.  ?-Wound care consulted for dressing change.  ?- Physical exam reassuring at this time.  ?  ?Iliopsoas abscess  ?Sacroiliac septic arthritis/osteomyelitis. Pelvic MRI showed bilateral infectious sacroiliitis and osteomyelitis of the sacrum and periarticular right iliac bone. Bilateral psoas abscesses, Small bilateral paraspinal muscle abscesses, intramuscular abscess within the left piriformis muscle, small intramuscular abscess within the left gluteus medius muscle. ?  ?Pleural effusion/Empyema ?-S/P chest tube placement and subsequent removal on 3/29. Stable on room air. ?  ?Normocytic anemia ?-S/p 2 units of PRBC transfusion at an outside facility.  hemoglobin stable around 8.  No signs of bleeding.  ?- Recheck weekly.  ?  ?Anxiety with depression ?- Noted to be on Prozac and Xanax as needed.  Will reduce Xanax to twice daily as needed. ?  ?Acute osteomyelitis of clavicle, right: s/p debridement of right clavicle and application of wound VAC on 3/31.  ?- Continue routine wound care.  ?-Orthopedics evaluated her on 10/05/2021: Wound VAC removed on 10/05/2021 by orthopedics.  Wound care as per orthopedics recommendations for  ? ?polysubstance abuse ([place]): Reports last use was 2 years ago. ?- Seems to have firm understanding of the disease of addiction and will pursue community treatment options to maintain recovery. ?- CSW consulted to provide any available resources in regards to relocation in this area from [place].  ?  ?Acute pain: She is on multiple medications including fentanyl patch, as needed Percocet, Robaxin, Flexeril Due to multifocal infections.  ?Will plan to taper gradually as she would be best served by coming off opioids at discharge. Alternative option, if pain continues to be severe (has multiple significant causes of ongoing pain), could transition to methadone if provider in the outpatient setting could be arranged.  Patient is interested in  methadone as well. ?  ?Transaminitis: Resolved. 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  ?  ?Vaginal discharge: Resolved s/p diflucan  ?  ?Hypotension: Patient has intermittent hypotension.  She has no dizziness but sometimes feels nauseous.  I offered her midodrine on 10/12/2021 which she declined.  I think her blood pressure is low because of being on several muscle relaxants and opioids but those are needed as she is still complaining of intermittent back pain. ? ?DVT prophylaxis: Lovenox ?Code Status: Full ?Family Communication: None at bedside ?Disposition Plan: ?Status is: Inpatient ?Remains inpatient appropriate because: Of need for IV antibiotics till 10/29/2021 as she has history of polysubstance abuse. ? ?Consultants: Orthopedic/ID/neurosurgery/PCCM/general surgery/IR ? ?Procedures: As above ? ?Antimicrobials:  ?Anti-infectives (From admission, onward)  ? ? Start     Dose/Rate Route Frequency Ordered Stop  ? 10/07/21 0100  vancomycin (VANCOREADY) IVPB 1250 mg/250 mL       ? 1,250 mg ?166.7 mL/hr over 90 Minutes Intravenous Every 12 hours 10/06/21 2358    ? 10/06/21 0000  vancomycin (VANCOREADY) IVPB 1500 mg/300 mL  Status:  Discontinued       ? 1,500 mg ?150 mL/hr over 120 Minutes Intravenous Every 12 hours 10/05/21 1526 10/06/21 2358  ? 10/05/21 1522  vancomycin variable dose per unstable renal function (pharmacist dosing)  Status:  Discontinued       ?  Does not apply See admin instructions 10/05/21 1522 10/05/21 1527  ? 10/01/21 2153  vancomycin (VANCOREADY) IVPB 1500 mg/300 mL  Status:  Discontinued       ? 1,500 mg ?150 mL/hr over 120 Minutes Intravenous Every 12 hours 10/01/21 2154 10/05/21 1522  ? 09/30/21 1100  vancomycin (  VANCOREADY) IVPB 750 mg/150 mL  Status:  Discontinued       ? 750 mg ?150 mL/hr over 60 Minutes Intravenous Every 8 hours 09/30/21 1007 10/01/21 2154  ? 09/27/21 1200  vancomycin (VANCOCIN) IVPB 1000 mg/200 mL premix  Status:  Discontinued       ? 1,000 mg ?200 mL/hr over 60  Minutes Intravenous Every 12 hours 09/27/21 1137 09/30/21 1007  ? 09/24/21 1145  fluconazole (DIFLUCAN) tablet 200 mg       ? 200 mg Oral  Once 09/24/21 1059 09/24/21 1410  ? 09/06/21 2200  vancomycin (VANCOREADY) IVPB

## 2021-10-16 NOTE — Progress Notes (Signed)
?PROGRESS NOTE ? ? ? ?Kimberly Booker  MRN:[id number] DOB: 01/11/1986 DOA: 08/26/2021 ?PCP: Pcp, No  ? ?Brief Narrative:  ?36 year old with past medical history significant for anxiety, depression, suicidal attempt drug overdose, polysubstance abuse, right clavicle fracture, was admitted at [facility] on 08/15/2021 due to right shoulder abscess.She was treated with I&D in the emergency department followed by IV antibiotics therapy as an inpatient.  She was diagnosed with MRSA bacteremia, endocarditis was ruled out with a TEE performed 3/16, but she continued to have positive blood cultures despite treatment with ceftriaxone and vancomycin.  Antibiotic therapy was changed to daptomycin.  She was then transferred to . ? ?Extensive imagings have been done here which showed right clavicle osteomyelitis/phlegmon/abscess, widespread cervical, thoracic, lumbar discitis/osteomyelitis/epidural abscess/cord impingement.  MRI also showed bilateral iliopsoas abscess, right complex pleural effusion.  ID, orthopedics, neurosurgery following. ?Underwent lumbar laminectomy/debridement of abscess, L4-L5 on 3/25. Hospital course also remarkable for finding of right-sided empyema, gastric perforation. Status post emergent laparotomy and perforation of repair by general surgery.  PCCM followed for chest tube on the right side, which was removed on 3/29. ?Underwent aspiration of the psoas abscesses on 3/30.  ?Underwent debridement of the right clavicle and application of wound VAC to the right shoulder area on 3/31. ?Developed severe lower back pain on 4/2.  MRI was done which showed improvement in the abscess.  Symptoms have improved. ?She is working with PT and progressing well.  ? ?Assessment & Plan: ?  ?MRSA bacteremia ?-Patient has disseminated MRSA bacteremia.  ?-She had TEE at outside facility on 3/16, negative for endocarditis as per the report . TTE did not show any vegetation ?- Per infectious disease  patient will need a total of 8 weeks of antibiotic therapy starting from the day of surgery on 09/03/2021 with an end date on 10/29/2021. Vancomycin continued. Cr stable. Blood cultures cleared as of the draw on 4/9.  ?-Currently afebrile and hemodynamically stable. ?  ?Gastric perforation ?- S/p surgical repair and patient is currently on soft diet and able to tolerate.  ?-General surgery has signed off ? ?Abscess in epidural space of cervical spine ?Discitis/osteomyelitis at C2-3, C5-6, and C6-7. Ventral epidural abscess at C2-3, C5, and C6 with cord impingement especially at the lower 2 levels. Facet arthritis on the left at C7-T1 and right at C3-4. ?-Also showed discitis/osteomyelitis of T9- T10, T11-T12 ?-Was seen by neurosurgery, no focal deficits at this time. ?-Continue with pain management. ?-Patient able to ambulate and participate in therapy at this time. ?  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 Bilateral facet arthritis at L5-S1. Dorsal epidural abscess at L4 and L5,highly compressive on the thecal sac. ?-S/P  lumbar laminectomy, debridement of abscess, L4-L5 on 3/25. ?-No further interventions planned by neurosurgery.   ?-On 4/2 patient was complaining of severe pain in the lower back radiating down to her legs.  MRI lumbar redemonstrated the osteomyelitis at T11-12 and L5-S1.  Noted to be status post L4-L5 laminectomy and debridement with significant decrease in the amount of the abscess.  A small collection was noted posterior to the S1 causing moderate thecal sac narrowing.  Other findings of iliopsoas abscess and sacroiliac septic arthritis were noted as seen previously on MRI pelvis. ?These findings were discussed with neurosurgery.  Patient does not have any focal neurological deficits.  No intervention was recommended.  Patient able to ambulate and no focal deficits seen. ?-Continue with therapy and   encourage  ambulation as tolerated. Pt refusing lovenox.  ?-Wound care consulted for dressing change.  ?- Physical exam reassuring at this time.  ?  ?Iliopsoas abscess  ?Sacroiliac septic arthritis/osteomyelitis. Pelvic MRI showed bilateral infectious sacroiliitis and osteomyelitis of the sacrum and periarticular right iliac bone. Bilateral psoas abscesses, Small bilateral paraspinal muscle abscesses, intramuscular abscess within the left piriformis muscle, small intramuscular abscess within the left gluteus medius muscle. ?  ?Pleural effusion/Empyema ?-S/P chest tube placement and subsequent removal on 3/29. Stable on room air. ?  ?Normocytic anemia ?-S/p 2 units of PRBC transfusion at an outside facility.  hemoglobin stable around 8.  No signs of bleeding.  ?- Recheck weekly.  ?  ?Anxiety with depression ?- Noted to be on Prozac and Xanax as needed.  Will reduce Xanax to twice daily as needed. ?  ?Acute osteomyelitis of clavicle, right: s/p debridement of right clavicle and application of wound VAC on 3/31.  ?- Continue routine wound care.  ?-Orthopedics evaluated her on 10/05/2021: Wound VAC removed on 10/05/2021 by orthopedics.  Wound care as per orthopedics recommendations for  ? ?polysubstance abuse ([facility]): Reports last use was 2 years ago. ?- Seems to have firm understanding of the disease of addiction and will pursue community treatment options to maintain recovery. ?- CSW consulted to provide any available resources in regards to relocation in this area from [place].  ?  ?Acute pain: She is on multiple medications including fentanyl patch, as needed Percocet, Robaxin, Flexeril Due to multifocal infections.  ?Will plan to taper gradually as she would be best served by coming off opioids at discharge. Alternative option, if pain continues to be severe (has multiple significant causes of ongoing pain), could transition to methadone if provider in the outpatient setting could be arranged.  Patient is interested in  methadone as well. ?  ?Transaminitis: Resolved. 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  ?  ?Vaginal discharge: Resolved s/p diflucan  ?  ?Hypotension: Patient has intermittent hypotension.  She has no dizziness but sometimes feels nauseous.  I offered her midodrine on 10/12/2021 which she declined.  I think her blood pressure is low because of being on several muscle relaxants and opioids but those are needed as she is still complaining of intermittent back pain. ? ?DVT prophylaxis: Lovenox ?Code Status: Full ?Family Communication: None at bedside ?Disposition Plan: ?Status is: Inpatient ?Remains inpatient appropriate because: Of need for IV antibiotics till 10/29/2021 as she has history of polysubstance abuse. ? ?Consultants: Orthopedic/ID/neurosurgery/PCCM/general surgery/IR ? ?Procedures: As above ? ?Antimicrobials:  ?Anti-infectives (From admission, onward)  ? ? Start     Dose/Rate Route Frequency Ordered Stop  ? 10/07/21 0100  vancomycin (VANCOREADY) IVPB 1250 mg/250 mL       ? 1,250 mg ?166.7 mL/hr over 90 Minutes Intravenous Every 12 hours 10/06/21 2358    ? 10/06/21 0000  vancomycin (VANCOREADY) IVPB 1500 mg/300 mL  Status:  Discontinued       ? 1,500 mg ?150 mL/hr over 120 Minutes Intravenous Every 12 hours 10/05/21 1526 10/06/21 2358  ? 10/05/21 1522  vancomycin variable dose per unstable renal function (pharmacist dosing)  Status:  Discontinued       ?  Does not apply See admin instructions 10/05/21 1522 10/05/21 1527  ? 10/01/21 2153  vancomycin (VANCOREADY) IVPB 1500 mg/300 mL  Status:  Discontinued       ? 1,500 mg ?150 mL/hr over 120 Minutes Intravenous Every 12 hours 10/01/21 2154 10/05/21 1522  ? 09/30/21 1100  vancomycin (  VANCOREADY) IVPB 750 mg/150 mL  Status:  Discontinued       ? 750 mg ?150 mL/hr over 60 Minutes Intravenous Every 8 hours 09/30/21 1007 10/01/21 2154  ? 09/27/21 1200  vancomycin (VANCOCIN) IVPB 1000 mg/200 mL premix  Status:  Discontinued       ? 1,000 mg ?200 mL/hr over 60  Minutes Intravenous Every 12 hours 09/27/21 1137 09/30/21 1007  ? 09/24/21 1145  fluconazole (DIFLUCAN) tablet 200 mg       ? 200 mg Oral  Once 09/24/21 1059 09/24/21 1410  ? 09/06/21 2200  vancomycin (VANCOREADY) IVPB

## 2021-10-16 NOTE — Plan of Care (Signed)

## 2021-10-16 NOTE — Plan of Care (Signed)

## 2021-10-17 LAB — CBC WITH DIFFERENTIAL/PLATELET
Abs Immature Granulocytes: 0.02 10*3/uL (ref 0.00–0.07)
Basophils Absolute: 0.1 10*3/uL (ref 0.0–0.1)
Basophils Relative: 1 %
Eosinophils Absolute: 0.4 10*3/uL (ref 0.0–0.5)
Eosinophils Relative: 6 %
HCT: 25.8 % — ABNORMAL LOW (ref 36.0–46.0)
Hemoglobin: 8.2 g/dL — ABNORMAL LOW (ref 12.0–15.0)
Immature Granulocytes: 0 %
Lymphocytes Relative: 40 %
Lymphs Abs: 2.5 10*3/uL (ref 0.7–4.0)
MCH: 27.2 pg (ref 26.0–34.0)
MCHC: 31.8 g/dL (ref 30.0–36.0)
MCV: 85.7 fL (ref 80.0–100.0)
Monocytes Absolute: 0.5 10*3/uL (ref 0.1–1.0)
Monocytes Relative: 7 %
Neutro Abs: 2.9 10*3/uL (ref 1.7–7.7)
Neutrophils Relative %: 46 %
Platelets: 380 10*3/uL (ref 150–400)
RBC: 3.01 MIL/uL — ABNORMAL LOW (ref 3.87–5.11)
RDW: 14.2 % (ref 11.5–15.5)
WBC: 6.3 10*3/uL (ref 4.0–10.5)
nRBC: 0 % (ref 0.0–0.2)

## 2021-10-17 LAB — BASIC METABOLIC PANEL
Anion gap: 9 (ref 5–15)
BUN: 9 mg/dL (ref 6–20)
CO2: 27 mmol/L (ref 22–32)
Calcium: 9.1 mg/dL (ref 8.9–10.3)
Chloride: 100 mmol/L (ref 98–111)
Creatinine, Ser: 0.45 mg/dL (ref 0.44–1.00)
GFR, Estimated: >60 mL/min (ref >60)
Glucose, Bld: 102 mg/dL — ABNORMAL HIGH (ref 70–99)
Potassium: 4 mmol/L (ref 3.5–5.1)
Sodium: 136 mmol/L (ref 135–145)

## 2021-10-17 NOTE — Progress Notes (Signed)
Mobility Specialist Progress Note: ? ? 10/17/21 1552  ?Mobility  ?Activity Ambulated with assistance in hallway  ?Level of Assistance Modified independent, requires aide device or extra time  ?Assistive Device None  ?Distance Ambulated (ft) 1120 ft  ?Activity Response Tolerated well  ?$Mobility charge 1 Mobility  ? ?Pt received in bed willing to participate in mobility. No complaints of pain. Left in bed with call bell in reach and all needs met.  ? ?Carmeron Heady ?Mobility Specialist ?Primary Phone [phone] ? ?

## 2021-10-17 NOTE — Plan of Care (Signed)
  Problem: Education: Goal: Knowledge of General Education information will improve Description Including pain rating scale, medication(s)/side effects and non-pharmacologic comfort measures Outcome: Progressing   Problem: Clinical Measurements: Goal: Ability to maintain clinical measurements within normal limits will improve Outcome: Progressing   Problem: Clinical Measurements: Goal: Will remain free from infection Outcome: Progressing   

## 2021-10-17 NOTE — Progress Notes (Signed)
?PROGRESS NOTE ? ? ? ?Kimberly Booker  MRN:[id number] DOB: 06/25/1985 DOA: 08/26/2021 ?PCP: Pcp, No  ? ?Brief Narrative:  ?36 year old with past medical history significant for anxiety, depression, suicidal attempt drug overdose, polysubstance abuse, right clavicle fracture, was admitted at [facility] on 08/15/2021 due to right shoulder abscess.She was treated with I&D in the emergency department followed by IV antibiotics therapy as an inpatient.  She was diagnosed with MRSA bacteremia, endocarditis was ruled out with a TEE performed 3/16, but she continued to have positive blood cultures despite treatment with ceftriaxone and vancomycin.  Antibiotic therapy was changed to daptomycin.  She was then transferred to [place]. ? ?Extensive imagings have been done here which showed right clavicle osteomyelitis/phlegmon/abscess, widespread cervical, thoracic, lumbar discitis/osteomyelitis/epidural abscess/cord impingement.  MRI also showed bilateral iliopsoas abscess, right complex pleural effusion.  ID, orthopedics, neurosurgery following. ?Underwent lumbar laminectomy/debridement of abscess, L4-L5 on 3/25. Hospital course also remarkable for finding of right-sided empyema, gastric perforation. Status post emergent laparotomy and perforation of repair by general surgery.  PCCM followed for chest tube on the right side, which was removed on 3/29. ?Underwent aspiration of the psoas abscesses on 3/30.  ?Underwent debridement of the right clavicle and application of wound VAC to the right shoulder area on 3/31. ?Developed severe lower back pain on 4/2.  MRI was done which showed improvement in the abscess.  Symptoms have improved. ?She is working with PT and progressing well.  ? ?Assessment & Plan: ?  ?MRSA bacteremia ?-Patient has disseminated MRSA bacteremia.  ?-She had TEE at outside facility on 3/16, negative for endocarditis as per the report . TTE did not show any vegetation ?- Per infectious disease  patient will need a total of 8 weeks of antibiotic therapy starting from the day of surgery on 09/03/2021 with an end date on 10/29/2021. Vancomycin continued. Cr stable. Blood cultures cleared as of the draw on 4/9.  ?-Currently afebrile and hemodynamically stable. ?  ?Gastric perforation ?- S/p surgical repair and patient is currently on soft diet and able to tolerate.  ?-General surgery has signed off ? ?Abscess in epidural space of cervical spine ?Discitis/osteomyelitis at C2-3, C5-6, and C6-7. Ventral epidural abscess at C2-3, C5, and C6 with cord impingement especially at the lower 2 levels. Facet arthritis on the left at C7-T1 and right at C3-4. ?-Also showed discitis/osteomyelitis of T9- T10, T11-T12 ?-Was seen by neurosurgery, no focal deficits at this time. ?-Continue with pain management. ?-Patient able to ambulate and participate in therapy at this time. ?  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 Bilateral facet arthritis at L5-S1. Dorsal epidural abscess at L4 and L5,highly compressive on the thecal sac. ?-S/P  lumbar laminectomy, debridement of abscess, L4-L5 on 3/25. ?-No further interventions planned by neurosurgery.   ?-On 4/2 patient was complaining of severe pain in the lower back radiating down to her legs.  MRI lumbar redemonstrated the osteomyelitis at T11-12 and L5-S1.  Noted to be status post L4-L5 laminectomy and debridement with significant decrease in the amount of the abscess.  A small collection was noted posterior to the S1 causing moderate thecal sac narrowing.  Other findings of iliopsoas abscess and sacroiliac septic arthritis were noted as seen previously on MRI pelvis. ?These findings were discussed with neurosurgery.  Patient does not have any focal neurological deficits.  No intervention was recommended.  Patient able to ambulate and no focal deficits seen. ?-Continue with therapy and   encourage  ambulation as tolerated. Pt refusing lovenox.  ?-Wound care consulted for dressing change.  ?- Physical exam reassuring at this time.  ?  ?Iliopsoas abscess  ?Sacroiliac septic arthritis/osteomyelitis. Pelvic MRI showed bilateral infectious sacroiliitis and osteomyelitis of the sacrum and periarticular right iliac bone. Bilateral psoas abscesses, Small bilateral paraspinal muscle abscesses, intramuscular abscess within the left piriformis muscle, small intramuscular abscess within the left gluteus medius muscle. ?  ?Pleural effusion/Empyema ?-S/P chest tube placement and subsequent removal on 3/29. Stable on room air. ?  ?Normocytic anemia ?-S/p 2 units of PRBC transfusion at an outside facility.  hemoglobin stable around 8.  No signs of bleeding.  ?- Recheck weekly.  ?  ?Anxiety with depression ?- Noted to be on Prozac and Xanax as needed.  Will reduce Xanax to twice daily as needed. ?  ?Acute osteomyelitis of clavicle, right: s/p debridement of right clavicle and application of wound VAC on 3/31.  ?- Continue routine wound care.  ?-Orthopedics evaluated her on 10/05/2021: Wound VAC removed on 10/05/2021 by orthopedics.  Wound care as per orthopedics recommendations for  ? ?polysubstance abuse (HCC): Reports last use was 2 years ago. ?- Seems to have firm understanding of the disease of addiction and will pursue community treatment options to maintain recovery. ?- CSW consulted to provide any available resources in regards to relocation in this area from [place].  ?  ?Acute pain: She is on multiple medications including fentanyl patch, as needed Percocet, Robaxin, Flexeril Due to multifocal infections.  ?Will plan to taper gradually as she would be best served by coming off opioids at discharge. Alternative option, if pain continues to be severe (has multiple significant causes of ongoing pain), could transition to methadone if provider in the outpatient setting could be arranged.  Patient is interested in  methadone as well. ?  ?Transaminitis: Resolved. 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  ?  ?Vaginal discharge: Resolved s/p diflucan  ?  ?Hypotension: Patient has intermittent hypotension.  She has no dizziness but sometimes feels nauseous.  I offered her midodrine on 10/12/2021 which she declined.  I think her blood pressure is low because of being on several muscle relaxants and opioids but those are needed as she is still complaining of intermittent back pain. ? ?DVT prophylaxis: Lovenox ?Code Status: Full ?Family Communication: None at bedside ?Disposition Plan: ?Status is: Inpatient ?Remains inpatient appropriate because: Of need for IV antibiotics till 10/29/2021 as she has history of polysubstance abuse. ? ?Consultants: Orthopedic/ID/neurosurgery/PCCM/general surgery/IR ? ?Procedures: As above ? ?Antimicrobials:  ?Anti-infectives (From admission, onward)  ? ? Start     Dose/Rate Route Frequency Ordered Stop  ? 10/07/21 0100  vancomycin (VANCOREADY) IVPB 1250 mg/250 mL       ? 1,250 mg ?166.7 mL/hr over 90 Minutes Intravenous Every 12 hours 10/06/21 2358    ? 10/06/21 0000  vancomycin (VANCOREADY) IVPB 1500 mg/300 mL  Status:  Discontinued       ? 1,500 mg ?150 mL/hr over 120 Minutes Intravenous Every 12 hours 10/05/21 1526 10/06/21 2358  ? 10/05/21 1522  vancomycin variable dose per unstable renal function (pharmacist dosing)  Status:  Discontinued       ?  Does not apply See admin instructions 10/05/21 1522 10/05/21 1527  ? 10/01/21 2153  vancomycin (VANCOREADY) IVPB 1500 mg/300 mL  Status:  Discontinued       ? 1,500 mg ?150 mL/hr over 120 Minutes Intravenous Every 12 hours 10/01/21 2154 10/05/21 1522  ? 09/30/21 1100  vancomycin (  VANCOREADY) IVPB 750 mg/150 mL  Status:  Discontinued       ? 750 mg ?150 mL/hr over 60 Minutes Intravenous Every 8 hours 09/30/21 1007 10/01/21 2154  ? 09/27/21 1200  vancomycin (VANCOCIN) IVPB 1000 mg/200 mL premix  Status:  Discontinued       ? 1,000 mg ?200 mL/hr over 60  Minutes Intravenous Every 12 hours 09/27/21 1137 09/30/21 1007  ? 09/24/21 1145  fluconazole (DIFLUCAN) tablet 200 mg       ? 200 mg Oral  Once 09/24/21 1059 09/24/21 1410  ? 09/06/21 2200  vancomycin (VANCOREADY) IVPB

## 2021-10-18 NOTE — Progress Notes (Signed)
Mobility Specialist Progress Note ? ? 10/18/21 1740  ?Mobility  ?Activity Ambulated independently in hallway  ?Level of Assistance Independent after set-up  ?Assistive Device None  ?Distance Ambulated (ft) 570 ft  ?Activity Response Tolerated well  ?$Mobility charge 1 Mobility  ? ?Pt easily aroused out of sleep, having slight complaint of R shoulder pain but agreeable. Asymptomatic throughout ambulation, returned back to bed w/ call bell in reach and all needs met. ? ?Holland Falling ?Mobility Specialist ?Phone Number [phone] ? ?

## 2021-10-18 NOTE — Plan of Care (Signed)

## 2021-10-18 NOTE — Progress Notes (Signed)
?PROGRESS NOTE ? ? ? ?Kimberly Booker  MRN:[id number] DOB: 07/09/1985 DOA: 08/26/2021 ?PCP: Pcp, No  ? ?Brief Narrative:  ?36 year old with past medical history significant for anxiety, depression, suicidal attempt drug overdose, polysubstance abuse, right clavicle fracture, was admitted at [facility] on 08/15/2021 due to right shoulder abscess.She was treated with I&D in the emergency department followed by IV antibiotics therapy as an inpatient.  She was diagnosed with MRSA bacteremia, endocarditis was ruled out with a TEE performed 3/16, but she continued to have positive blood cultures despite treatment with ceftriaxone and vancomycin.  Antibiotic therapy was changed to daptomycin.  She was then transferred to . ? ?Extensive imagings have been done here which showed right clavicle osteomyelitis/phlegmon/abscess, widespread cervical, thoracic, lumbar discitis/osteomyelitis/epidural abscess/cord impingement.  MRI also showed bilateral iliopsoas abscess, right complex pleural effusion.  ID, orthopedics, neurosurgery following. ?Underwent lumbar laminectomy/debridement of abscess, L4-L5 on 3/25. Hospital course also remarkable for finding of right-sided empyema, gastric perforation. Status post emergent laparotomy and perforation of repair by general surgery.  PCCM followed for chest tube on the right side, which was removed on 3/29. ?Underwent aspiration of the psoas abscesses on 3/30.  ?Underwent debridement of the right clavicle and application of wound VAC to the right shoulder area on 3/31. ?Developed severe lower back pain on 4/2.  MRI was done which showed improvement in the abscess.  Symptoms have improved. ?She is working with PT and progressing well.  ? ?Assessment & Plan: ?  ?MRSA bacteremia ?-Patient has disseminated MRSA bacteremia.  ?-She had TEE at outside facility on 3/16, negative for endocarditis as per the report . TTE did not show any vegetation ?- Per infectious disease  patient will need a total of 8 weeks of antibiotic therapy starting from the day of surgery on 09/03/2021 with an end date on 10/29/2021. Vancomycin continued. Cr stable. Blood cultures cleared as of the draw on 4/9.  ?-Currently afebrile and hemodynamically stable. ?  ?Gastric perforation ?- S/p surgical repair and patient is currently on soft diet and able to tolerate.  ?-General surgery has signed off ? ?Abscess in epidural space of cervical spine ?Discitis/osteomyelitis at C2-3, C5-6, and C6-7. Ventral epidural abscess at C2-3, C5, and C6 with cord impingement especially at the lower 2 levels. Facet arthritis on the left at C7-T1 and right at C3-4. ?-Also showed discitis/osteomyelitis of T9- T10, T11-T12 ?-Was seen by neurosurgery, no focal deficits at this time. ?-Continue with pain management. ?-Patient able to ambulate and participate in therapy at this time. ?  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 Bilateral facet arthritis at L5-S1. Dorsal epidural abscess at L4 and L5,highly compressive on the thecal sac. ?-S/P  lumbar laminectomy, debridement of abscess, L4-L5 on 3/25. ?-No further interventions planned by neurosurgery.   ?-On 4/2 patient was complaining of severe pain in the lower back radiating down to her legs.  MRI lumbar redemonstrated the osteomyelitis at T11-12 and L5-S1.  Noted to be status post L4-L5 laminectomy and debridement with significant decrease in the amount of the abscess.  A small collection was noted posterior to the S1 causing moderate thecal sac narrowing.  Other findings of iliopsoas abscess and sacroiliac septic arthritis were noted as seen previously on MRI pelvis. ?These findings were discussed with neurosurgery.  Patient does not have any focal neurological deficits.  No intervention was recommended.  Patient able to ambulate and no focal deficits seen. ?-Continue with therapy and   encourage  ambulation as tolerated. Pt refusing lovenox.  ?-Wound care consulted for dressing change.  ?- Physical exam reassuring at this time.  ?  ?Iliopsoas abscess  ?Sacroiliac septic arthritis/osteomyelitis. Pelvic MRI showed bilateral infectious sacroiliitis and osteomyelitis of the sacrum and periarticular right iliac bone. Bilateral psoas abscesses, Small bilateral paraspinal muscle abscesses, intramuscular abscess within the left piriformis muscle, small intramuscular abscess within the left gluteus medius muscle. ?  ?Pleural effusion/Empyema ?-S/P chest tube placement and subsequent removal on 3/29. Stable on room air. ?  ?Normocytic anemia ?-S/p 2 units of PRBC transfusion at an outside facility.  hemoglobin stable around 8.  No signs of bleeding.  ?- Recheck weekly.  ?  ?Anxiety with depression ?- Noted to be on Prozac and Xanax as needed.  Will reduce Xanax to twice daily as needed. ?  ?Acute osteomyelitis of clavicle, right: s/p debridement of right clavicle and application of wound VAC on 3/31.  ?- Continue routine wound care.  ?-Orthopedics evaluated her on 10/05/2021: Wound VAC removed on 10/05/2021 by orthopedics.  Wound care as per orthopedics recommendations for  ? ?polysubstance abuse (HCC): Reports last use was 2 years ago. ?- Seems to have firm understanding of the disease of addiction and will pursue community treatment options to maintain recovery. ?- CSW consulted to provide any available resources in regards to relocation in this area from [place].  ?  ?Acute pain: She is on multiple medications including fentanyl patch, as needed Percocet, Robaxin, Flexeril Due to multifocal infections.  ?Will plan to taper gradually as she would be best served by coming off opioids at discharge. Alternative option, if pain continues to be severe (has multiple significant causes of ongoing pain), could transition to methadone if provider in the outpatient setting could be arranged.  Patient is interested in  methadone as well. ?  ?Transaminitis: Resolved. 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  ?  ?Vaginal discharge: Resolved s/p diflucan  ?  ?Hypotension: Patient has intermittent hypotension.  She has no dizziness but sometimes feels nauseous.  I offered her midodrine on 10/12/2021 which she declined.  I think her blood pressure is low because of being on several muscle relaxants and opioids but those are needed as she is still complaining of intermittent back pain. ? ?DVT prophylaxis: Lovenox ?Code Status: Full ?Family Communication: None at bedside ?Disposition Plan: ?Status is: Inpatient ?Remains inpatient appropriate because: Of need for IV antibiotics till 10/29/2021 as she has history of polysubstance abuse. ? ?Consultants: Orthopedic/ID/neurosurgery/PCCM/general surgery/IR ? ?Procedures: As above ? ?Antimicrobials:  ?Anti-infectives (From admission, onward)  ? ? Start     Dose/Rate Route Frequency Ordered Stop  ? 10/07/21 0100  vancomycin (VANCOREADY) IVPB 1250 mg/250 mL       ? 1,250 mg ?166.7 mL/hr over 90 Minutes Intravenous Every 12 hours 10/06/21 2358    ? 10/06/21 0000  vancomycin (VANCOREADY) IVPB 1500 mg/300 mL  Status:  Discontinued       ? 1,500 mg ?150 mL/hr over 120 Minutes Intravenous Every 12 hours 10/05/21 1526 10/06/21 2358  ? 10/05/21 1522  vancomycin variable dose per unstable renal function (pharmacist dosing)  Status:  Discontinued       ?  Does not apply See admin instructions 10/05/21 1522 10/05/21 1527  ? 10/01/21 2153  vancomycin (VANCOREADY) IVPB 1500 mg/300 mL  Status:  Discontinued       ? 1,500 mg ?150 mL/hr over 120 Minutes Intravenous Every 12 hours 10/01/21 2154 10/05/21 1522  ? 09/30/21 1100  vancomycin (  VANCOREADY) IVPB 750 mg/150 mL  Status:  Discontinued       ? 750 mg ?150 mL/hr over 60 Minutes Intravenous Every 8 hours 09/30/21 1007 10/01/21 2154  ? 09/27/21 1200  vancomycin (VANCOCIN) IVPB 1000 mg/200 mL premix  Status:  Discontinued       ? 1,000 mg ?200 mL/hr over 60  Minutes Intravenous Every 12 hours 09/27/21 1137 09/30/21 1007  ? 09/24/21 1145  fluconazole (DIFLUCAN) tablet 200 mg       ? 200 mg Oral  Once 09/24/21 1059 09/24/21 1410  ? 09/06/21 2200  vancomycin (VANCOREADY) IVPB

## 2021-10-19 DIAGNOSIS — B9562 Methicillin resistant Staphylococcus aureus infection as the cause of diseases classified elsewhere: Secondary | ICD-10-CM | POA: Diagnosis not present

## 2021-10-19 DIAGNOSIS — R7881 Bacteremia: Secondary | ICD-10-CM | POA: Diagnosis not present

## 2021-10-19 MED ORDER — OXYCODONE-ACETAMINOPHEN 7.5-325 MG PO TABS
1.0000 | ORAL_TABLET | Freq: Four times a day (QID) | ORAL | Status: DC | PRN
  Administered 2021-10-19 – 2021-10-29 (×29): 1 via ORAL
  Filled 2021-10-19 (×30): qty 1

## 2021-10-19 MED ORDER — METHOCARBAMOL 500 MG PO TABS
1000.0000 mg | ORAL_TABLET | Freq: Three times a day (TID) | ORAL | Status: DC | PRN
  Administered 2021-10-19 – 2021-10-27 (×8): 1000 mg via ORAL
  Filled 2021-10-19 (×8): qty 2

## 2021-10-19 NOTE — Progress Notes (Signed)
Mobility Specialist Progress Note  ? ? 10/19/21 1521  ?Mobility  ?Activity Ambulated independently in hallway  ?Level of Assistance Independent  ?Assistive Device None  ?Distance Ambulated (ft) 800 ft  ?Activity Response Tolerated well  ?$Mobility charge 1 Mobility  ? ?Pt received in bed and agreeable. No complaints on walk. Took an extended rest break outside. Returned to bed with call bell in reach.   ? ?Pettit Nation ?Mobility Specialist  ?Primary: [facility] [facility] Phone: [phone] ?Secondary: [facility] [facility] Phone: [phone] ?  ?

## 2021-10-19 NOTE — Plan of Care (Addendum)
Completed dressing change. Also attempted to remove sponge but still very minimum amount removed due to wound attempting to heal over sponge. Sponge soaked in peroxide and clean dressing applied. Patient concerned with existing remaining sponge, department director notified Dr. Lajoyce Corners of situation. Dr. Lajoyce Corners responded he will assess at bedside tomorrow. ? ? ?Problem: Education: ?Goal: Knowledge of General Education information will improve ?Description: Including pain rating scale, medication(s)/side effects and non-pharmacologic comfort measures ?Outcome: Progressing ?  ?Problem: Activity: ?Goal: Risk for activity intolerance will decrease ?Outcome: Progressing ?  ?Problem: Pain Managment: ?Goal: General experience of comfort will improve ?Outcome: Progressing ?  ?Problem: Safety: ?Goal: Ability to remain free from injury will improve ?Outcome: Progressing ?  ?Problem: Skin Integrity: ?Goal: Risk for impaired skin integrity will decrease ?Outcome: Progressing ?  ?

## 2021-10-19 NOTE — Progress Notes (Signed)
?PROGRESS NOTE ? ? ? ?Kimberly Booker  MRN:[id number] DOB: 10/17/1985 DOA: 08/26/2021 ?PCP: Pcp, No  ? ?Brief Narrative:  ?36 year old with past medical history significant for anxiety, depression, suicidal attempt drug overdose, polysubstance abuse, right clavicle fracture, was admitted at [facility] on 08/15/2021 due to right shoulder abscess.She was treated with I&D in the emergency department followed by IV antibiotics therapy as an inpatient.  She was diagnosed with MRSA bacteremia, endocarditis was ruled out with a TEE performed 3/16, but she continued to have positive blood cultures despite treatment with ceftriaxone and vancomycin.  Antibiotic therapy was changed to daptomycin.  She was then transferred to [place]. ? ?Extensive imagings have been done here which showed right clavicle osteomyelitis/phlegmon/abscess, widespread cervical, thoracic, lumbar discitis/osteomyelitis/epidural abscess/cord impingement.  MRI also showed bilateral iliopsoas abscess, right complex pleural effusion.  ID, orthopedics, neurosurgery following. ?Underwent lumbar laminectomy/debridement of abscess, L4-L5 on 3/25. Hospital course also remarkable for finding of right-sided empyema, gastric perforation. Status post emergent laparotomy and perforation of repair by general surgery.  PCCM followed for chest tube on the right side, which was removed on 3/29. ?Underwent aspiration of the psoas abscesses on 3/30.  ?Underwent debridement of the right clavicle and application of wound VAC to the right shoulder area on 3/31. ?Developed severe lower back pain on 4/2.  MRI was done which showed improvement in the abscess.  Symptoms have improved. ?She is working with PT and progressing well.  ? ?Assessment & Plan: ?  ?MRSA bacteremia ?-Patient has disseminated MRSA bacteremia.  ?-She had TEE at outside facility on 3/16, negative for endocarditis as per the report . TTE did not show any vegetation ?- Per infectious disease  patient will need a total of 8 weeks of antibiotic therapy starting from the day of surgery on 09/03/2021 with an end date on 10/29/2021. Vancomycin continued. Cr stable. Blood cultures cleared as of the draw on 4/9.  ?-Currently afebrile and hemodynamically stable. ?  ?Gastric perforation ?- S/p surgical repair and patient is currently on soft diet and able to tolerate.  ?-General surgery has signed off ? ?Abscess in epidural space of cervical spine ?Discitis/osteomyelitis at C2-3, C5-6, and C6-7. Ventral epidural abscess at C2-3, C5, and C6 with cord impingement especially at the lower 2 levels. Facet arthritis on the left at C7-T1 and right at C3-4. ?-Also showed discitis/osteomyelitis of T9- T10, T11-T12 ?-Was seen by neurosurgery, no focal deficits at this time. ?-Continue with pain management. ?-Patient able to ambulate and participate in therapy at this time. ?  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 Bilateral facet arthritis at L5-S1. Dorsal epidural abscess at L4 and L5,highly compressive on the thecal sac. ?-S/P  lumbar laminectomy, debridement of abscess, L4-L5 on 3/25. ?-No further interventions planned by neurosurgery.   ?-On 4/2 patient was complaining of severe pain in the lower back radiating down to her legs.  MRI lumbar redemonstrated the osteomyelitis at T11-12 and L5-S1.  Noted to be status post L4-L5 laminectomy and debridement with significant decrease in the amount of the abscess.  A small collection was noted posterior to the S1 causing moderate thecal sac narrowing.  Other findings of iliopsoas abscess and sacroiliac septic arthritis were noted as seen previously on MRI pelvis. ?These findings were discussed with neurosurgery.  Patient does not have any focal neurological deficits.  No intervention was recommended.  Patient able to ambulate and no focal deficits seen. ?-Continue with therapy and   encourage  ambulation as tolerated. Pt refusing lovenox.  ?-Wound care consulted for dressing change.  ?- Physical exam reassuring at this time.  ?  ?Iliopsoas abscess  ?Sacroiliac septic arthritis/osteomyelitis. Pelvic MRI showed bilateral infectious sacroiliitis and osteomyelitis of the sacrum and periarticular right iliac bone. Bilateral psoas abscesses, Small bilateral paraspinal muscle abscesses, intramuscular abscess within the left piriformis muscle, small intramuscular abscess within the left gluteus medius muscle. ?  ?Pleural effusion/Empyema ?-S/P chest tube placement and subsequent removal on 3/29. Stable on room air. ?  ?Normocytic anemia ?-S/p 2 units of PRBC transfusion at an outside facility.  hemoglobin stable around 8.  No signs of bleeding.  ?- Recheck weekly.  ?  ?Anxiety with depression ?- Noted to be on Prozac and Xanax as needed.  I reduced her Xanax to twice daily as needed. ?  ?Acute osteomyelitis of clavicle, right: s/p debridement of right clavicle and application of wound VAC on 3/31.  ?- Continue routine wound care.  ?-Orthopedics evaluated her on 10/05/2021: Wound VAC removed on 10/05/2021 by orthopedics.  Wound care as per orthopedics recommendations for  ? ?polysubstance abuse (HCC): Reports last use was 2 years ago. ?- Seems to have firm understanding of the disease of addiction and will pursue community treatment options to maintain recovery.  She is interested in methadone or Suboxone.  She will need to be hooked up to 1 of those clinics before discharge and will need to be started on those medications prior to discharge if possible. ?- CSW consulted to provide any available resources in regards to relocation in this area from [place].  ?  ?Acute pain: She is on multiple medications including fentanyl patch, as needed Percocet, Robaxin, Flexeril, lidocaine patch and fentanyl patch due to multifocal infections.  Now that she is nearing her discharge, I have reduced her Robaxin from every 6  hours as needed to every 8 hours as needed today as well as reduced her Percocet from every 4 hours to every 6 hours as needed today.  Alternative option, if pain continues to be severe (has multiple significant causes of ongoing pain), could transition to methadone if provider in the outpatient setting could be arranged.  Patient is interested in methadone as well. ?  ?Transaminitis: Resolved. 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  ?  ?Vaginal discharge: Resolved s/p diflucan  ?  ?Hypotension: Patient has intermittent hypotension.  She has no dizziness but sometimes feels nauseous.  I offered her midodrine on 10/12/2021 which she declined.  I think her blood pressure is low because of being on several muscle relaxants and opioids but those are needed as she is still complaining of intermittent back pain. ? ?DVT prophylaxis: Lovenox ?Code Status: Full ?Family Communication: None at bedside ?Disposition Plan: ?Status is: Inpatient ?Remains inpatient appropriate because: Of need for IV antibiotics till 10/29/2021 as she has history of polysubstance abuse. ? ?Consultants: Orthopedic/ID/neurosurgery/PCCM/general surgery/IR ? ?Procedures: As above ? ?Antimicrobials:  ?Anti-infectives (From admission, onward)  ? ? Start     Dose/Rate Route Frequency Ordered Stop  ? 10/07/21 0100  vancomycin (VANCOREADY) IVPB 1250 mg/250 mL       ? 1,250 mg ?166.7 mL/hr over 90 Minutes Intravenous Every 12 hours 10/06/21 2358    ? 10/06/21 0000  vancomycin (VANCOREADY) IVPB 1500 mg/300 mL  Status:  Discontinued       ? 1,500 mg ?150 mL/hr over 120 Minutes Intravenous Every 12 hours 10/05/21 1526 10/06/21 2358  ? 10/05/21 1522  vancomycin variable dose per unstable  renal function (pharmacist dosing)  Status:  Discontinued       ?  Does not apply See admin instructions 10/05/21 1522 10/05/21 1527  ? 10/01/21 2153  vancomycin (VANCOREADY) IVPB 1500 mg/300 mL  Status:  Discontinued       ? 1,500 mg ?150 mL/hr over 120 Minutes Intravenous  Every 12 hours 10/01/21 2154 10/05/21 1522  ? 09/30/21 1100  vancomycin (VANCOREADY) IVPB 750 mg/150 mL  Status:  Discontinued       ? 750 mg ?150 mL/hr over 60 Minutes Intravenous Every 8 hours 09/30/21 1007 04/28

## 2021-10-19 NOTE — Plan of Care (Signed)

## 2021-10-20 LAB — CREATININE, SERUM
Creatinine, Ser: 0.41 mg/dL — ABNORMAL LOW (ref 0.44–1.00)
GFR, Estimated: >60 mL/min (ref >60)

## 2021-10-20 LAB — VANCOMYCIN, TROUGH: Vancomycin Tr: 13 ug/mL — ABNORMAL LOW (ref 15–20)

## 2021-10-20 NOTE — Progress Notes (Signed)
Pharmacy Antibiotic Note ? ?Kimberly Booker is a 36 y.o. female admitted on 08/26/2021 with disseminated MRSA bacteremia (diffuse discitis/osteomyelitis/epidural abscess of cervical, thoracic and lumbar spine, bilateral septic arthritis, osteomyelitis of R clavicle, R lung empyema).  ?  ?Patient was initially started on vancomycin 3/13-3/15 along with ceftriaxone 3/13-3/24. Vancomycin was transitioned to daptomycin and given 3/16-3/25. Pt was transferred to [facility] on 08/26/21. ID has consulted pharmacy dose vancomycin for coverage of MRSA R lung empyema.  ?  ?Patient is s/p placement of IR drains into iliopsoas abscesses 3/30 and debridement of right clavicle 3/31. ? ?Vancomycin trough resulted at 13 this morning (no peak drawn), stable from previous (ranged 10-13 over previous month with therapeutic AUCs). AUC remains therapeutic extrapolating using previous peak. Renal function remains stable. ? ?Tentative end date: 10/29/21 per ID recs (8 wks) ? ?Plan: ?Continue Vancomycin to 1250 mg IV every 12 hours ?Monitor renal function ?Monitor weekly vancomycin peak/trough - next 5/24 ? ?Height: 5\' 7"  (170.2 cm) ?Weight: 77.1 kg (170 lb) ?IBW/kg (Calculated) : 61.6 ? ?Temp (24hrs), Avg:98.5 ?F (36.9 ?C), Min:98.2 ?F (36.8 ?C), Max:98.7 ?F (37.1 ?C) ? ?Recent Labs  ?Lab 10/14/21 ?12/14/21 10/17/21 ?10/19/21 10/20/21 ?10/22/21 10/20/21 ?0914  ?WBC 5.8 6.3  --   --   ?CREATININE 0.45 0.45 0.41*  --   ?VANCOTROUGH  --   --   --  13*  ? ?  ?Estimated Creatinine Clearance: 104.1 mL/min (A) (by C-G formula based on SCr of 0.41 mg/dL (L)).   ? ?Allergies  ?Allergen Reactions  ? Amoxicillin Swelling  ?  Throat closes  ? ? ?10/22/21, PharmD, BCPS ?Please check AMION for all [facility] contact numbers ?Clinical Pharmacist ?10/20/2021 2:24 PM ?

## 2021-10-20 NOTE — Progress Notes (Signed)
Patient ID: Kimberly Booker, female   DOB: 1986/02/14, 36 y.o.   MRN: [id number] ?Patient seen in follow-up for the right shoulder clavicle wound.  Examination there is improved granulation tissue there is a few small fragments of sponge remaining pickups were used to remove fragments of the sponge.  This will need to continue daily to gently debride the remaining sponge.  Continue with the peroxide dressing changes to loosen up the small pieces of remaining sponge ?

## 2021-10-20 NOTE — Progress Notes (Signed)
Mobility Specialist Progress Note  ? ? 10/20/21 1631  ?Mobility  ?Activity Ambulated independently in hallway  ?Level of Assistance Standby assist, set-up cues, supervision of patient - no hands on  ?Assistive Device None  ?Distance Ambulated (ft) 800 ft  ?Activity Response Tolerated well  ?$Mobility charge 1 Mobility  ? ?Pt received in bed and agreeable. C/o some back pain. Took extended seated rest outside. Returned to bed with call bell in reach.  ? ?[place] Nation ?Mobility Specialist  ?Primary: [address] Phone: [phone] ?Secondary: [address] Phone: [phone] ?  ?

## 2021-10-20 NOTE — Progress Notes (Signed)
?PROGRESS NOTE ? ?Kimberly Booker  [id number] DOB: 02-19-1986 DOA: 08/26/2021 ?PCP: Pcp, No  ? ?Brief Narrative: ?36 year old with past medical history significant for anxiety, depression, suicidal attempt drug overdose, polysubstance abuse , right clavicle fracture 8 months ago, she reported having occasional pain but a week ago and  was admitted at [facility] on  08/15/2021 due to right shoulder abscess.She was treated with I&D in the emergency department followed by IV antibiotics therapy as an inpatient.  She was diagnosed with MRSA bacteremia ,endocarditis was ruled out with a TEE performed 3/16,but she continued to have positive blood cultures despite treatment with ceftriaxone and vancomycin.  Antibiotic therapy was changed to daptomycin.  She was then transferred to [facility]. ?Extensive imagings have been done here which showed right clavicle osteomyelitis/phlegmon/abscess, widespread cervical,thoracic,lumbar discitis/osteomyelitis/epidural abscess/cord impingement.  MRI also showed bilateral iliopsoas abscess, right complex pleural effusion.  ID, orthopedics, neurosurgery following.    Underwent lumbar laminectomy / debridement of abscess, L4-L5 on 3/25.  Hospital course also remarkable for finding of right-sided empyema, gastric perforation.  Status post emergent laparotomy and perforation of repair by general surgery.  PCCM also following for chest tube on the right side,now removed.Underwent aspiration of the psoas abscesses on 3/30.  ?Underwent debridement of the right clavicle and application of wound VAC to the right shoulder area on 3/31.Developed severe lower back pain on 4/2.  MRI was done which showed improvement in the abscess.  Symptoms have improved, she is working with PT.  Now waiting to finish antibiotic course which will be completed on 5/26. ? ?Assessment & Plan: ? ?Principal Problem: ?  MRSA bacteremia ?Active Problems: ?  Abscess in epidural space of lumbar spine ?   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  Gastric perforation ([place]) ?  Iliopsoas abscess ([place]) ?  Pleural effusion ?  Normocytic anemia ?  Anxiety with depression ?  Pathologic fracture of right clavicle ?  Acute osteomyelitis of clavicle, right (HCC) ?  Polysubstance abuse ([place]) ?  Transaminitis ?  Pain management ? ? ?MRSA bacteremia ?-Currently on vancomycin, end date 10/29/2021.  Has a PICC line.  Hemodynamically stable ?  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 Bilateral facet arthritis at L5-S1. Dorsal epidural abscess at L4 and L5,highly compressive on the thecal sac. ?-S/P  lumbar laminectomy, debridement of abscess, L4-L5 on 3/25. ?-No further interventions planned by neurosurgery.   ?-On 4/2 patient was complaining of severe pain in the lower back radiating down to her legs.  MRI lumbar redemonstrated the osteomyelitis at T11-12 and L5-S1.  Noted to be status post L4-L5 laminectomy and debridement with significant decrease in the amount of the abscess.  A small collection was noted posterior to the S1 causing moderate thecal sac narrowing.  Other findings of iliopsoas abscess and sacroiliac septic arthritis were noted as seen previously on MRI pelvis. ?These findings were discussed with neurosurgery.  Patient does not have any focal neurological deficits.  No intervention was recommended.  Patient able to ambulate and no focal deficits seen. ?-Continue with therapy and encourage ambulation as tolerated. Pt refusing lovenox.  ?-Wound care consulted for dressing change.  ?- Physical exam reassuring at this time.  ? ?Gastric perforation  ?Status post surgical repair, currently on soft diet, tolerating.  General surgery signed off ?  ?Iliopsoas abscess  ?Pelvic MRI showed bilateral infectious ?sacroiliitis and osteomyelitis of the sacrum and periarticular right iliac bone. Bilateral psoas  abscesses ,Small bilateral  paraspinal muscle abscesses,intramuscular abscess within the left piriformis muscle ,small intramuscular abscess within the left gluteus medius muscle. ?The plan is to continue antibiotics for these. ?  ?Pleural effusion ?Chest CT showed a large loculated right pleural effusion concerning for empyema, subcentimeter groundglass nodularity within the bilateral lungs concerning for septic emboli.  S/P chest tube placement,now removed. ?  ?Abscess in epidural space of cervical spine ?Discitis/osteomyelitis at C2-3, C5-6, and C6-7. Ventral epidural abscess at C2-3, C5, and C6 with cord impingement especially at the lower 2 levels. Facet arthritis on the left at C7-T1 and right at C3-4. ?-Also showed discitis/osteomyelitis of T9- T10, T11-T12 ?-Was seen by neurosurgery, no focal deficits at this time. ?-Continue with pain management. ?-Patient able to ambulate and participate in therapy at this time. ?  ?Normocytic anemia ?Monitor hb.  Currently stable.  Received 2 units PRBC outside facility.  ?  ?Severe protein-calorie malnutrition  ?Nutrition is consulted ?  ?Polysubstance abuse: She is interested in methadone/Suboxone, she needs to follow-up with opioid addiction clinic as an outpatient ?Counseled for cessation.  TOC following  ? ?anxiety with depression ?On Prozac and Xanax ?  ?Pathologic fracture of right clavicle ?MRI of the shoulder showed osteomyelitis, phlegmon, possible abscess.  Status post shoulder washout ?  ?  ? ?  ? ? ?Nutrition Problem: Increased nutrient needs ?Etiology: acute illness, post-op healing, wound healing ?  ? ?DVT prophylaxis:SCDs Start: 09/03/21 1605 ?enoxaparin (LOVENOX) injection 40 mg Start: 08/29/21 1000 ?Place and maintain sequential compression device Start: 08/28/21 1049 ? ? ?  Code Status: Full Code ? ?Family Communication: None at bed side ? ?Patient status:Inpatient ? ?Patient is from :Home ? ?Anticipated discharge NE:[id number] ? ?Estimated DC date:5/27 ? ? ?Consultants: ID,  orthopedics, neurosurgery ? ?Procedures: As above ?Antimicrobials:  ?Anti-infectives (From admission, onward)  ? ? Start     Dose/Rate Route Frequency Ordered Stop  ? 10/07/21 0100  vancomycin (VANCOREADY) IVPB 1250 mg/250 mL       ? 1,250 mg ?166.7 mL/hr over 90 Minutes Intravenous Every 12 hours 10/06/21 2358    ? 10/06/21 0000  vancomycin (VANCOREADY) IVPB 1500 mg/300 mL  Status:  Discontinued       ? 1,500 mg ?150 mL/hr over 120 Minutes Intravenous Every 12 hours 10/05/21 1526 10/06/21 2358  ? 10/05/21 1522  vancomycin variable dose per unstable renal function (pharmacist dosing)  Status:  Discontinued       ?  Does not apply See admin instructions 10/05/21 1522 10/05/21 1527  ? 10/01/21 2153  vancomycin (VANCOREADY) IVPB 1500 mg/300 mL  Status:  Discontinued       ? 1,500 mg ?150 mL/hr over 120 Minutes Intravenous Every 12 hours 10/01/21 2154 10/05/21 1522  ? 09/30/21 1100  vancomycin (VANCOREADY) IVPB 750 mg/150 mL  Status:  Discontinued       ? 750 mg ?150 mL/hr over 60 Minutes Intravenous Every 8 hours 09/30/21 1007 10/01/21 2154  ? 09/27/21 1200  vancomycin (VANCOCIN) IVPB 1000 mg/200 mL premix  Status:  Discontinued       ? 1,000 mg ?200 mL/hr over 60 Minutes Intravenous Every 12 hours 09/27/21 1137 09/30/21 1007  ? 09/24/21 1145  fluconazole (DIFLUCAN) tablet 200 mg       ? 200 mg Oral  Once 09/24/21 1059 09/24/21 1410  ? 09/06/21 2200  vancomycin (VANCOREADY) IVPB 1500 mg/300 mL  Status:  Discontinued       ? 1,500 mg ?150 mL/hr over 120 Minutes  Intravenous Every 12 hours 09/06/21 1257 09/27/21 1137  ? 09/03/21 1355  vancomycin (VANCOCIN) powder  Status:  Discontinued       ?   As needed 09/03/21 1355 09/03/21 1426  ? 09/01/21 2200  vancomycin (VANCOREADY) IVPB 1750 mg/350 mL  Status:  Discontinued       ? 1,750 mg ?175 mL/hr over 120 Minutes Intravenous Every 12 hours 09/01/21 1058 09/06/21 1257  ? 08/30/21 1000  metroNIDAZOLE (FLAGYL) IVPB 500 mg  Status:  Discontinued       ? 500 mg ?100 mL/hr over 60  Minutes Intravenous Every 12 hours 08/30/21 0036 08/30/21 1708  ? 08/30/21 0600  metroNIDAZOLE (FLAGYL) IVPB 500 mg  Status:  Discontinued       ? 500 mg ?100 mL/hr over 60 Minutes Intravenous On call to O.R. 08/30/21 0220 0

## 2021-10-20 NOTE — Progress Notes (Signed)
Nutrition Follow-up ? ?DOCUMENTATION CODES:  ? ?Not applicable ? ?INTERVENTION:  ?Continue Ensure Enlive po TID, each supplement provides 350 kcal and 20 grams of protein. ? ?Continue MVI once daily.  ? ?NUTRITION DIAGNOSIS:  ? ?Increased nutrient needs related to acute illness, post-op healing, wound healing as evidenced by estimated needs; ongoing ? ?GOAL:  ? ?Patient will meet greater than or equal to 90% of their needs; met ? ?MONITOR:  ? ?PO intake, Supplement acceptance, Labs, Weight trends, Skin, I & O's ? ?REASON FOR ASSESSMENT:  ? ?Consult ?Assessment of nutrition requirement/status ? ?ASSESSMENT:  ? ?36 year old female with medical history of anxiety, depression, suicide attempt by drug OD, polysubstance abuse, R clavicle fx 8 months ago. She presented to the ED ongoing R shoulder pain. She had gone to another hospital 2 weeks ago and found to have R shoulder abscess which was treated with I&D and IV abx. She was dx with MRSA bacteremia and endocarditis was r/o on TEE on 3/16. She was transferred from [facility] to [facility] due to ongoing positive blood cultures despite treatment. Once at [facility], extensive imaging showed R clavicle osteomyelitis/phlegmon/abscess, widespread cervical,thoracic,lumbar discitis/osteomyelitis/epidural abscess/cord impingement. MRI showed bilateral iliopsoas abscess, R complex pleural effusion. ID, Orthopedics, and Neurosurgery following.  Currently on daptomycin. Plan for transthoracic echo. Neurosurgery planning for lumbar laminectomy for debridement of abscess, L4-L5. ? ?Per infectious disease patient will need a total of 8 weeks of antibiotic therapy starting from the day of surgery on 09/03/2021 with an end date on 10/29/2021. ?  ?3/25 - s/p lumbar laminectomy/debridement of abscess, L4-L5 ?3/27 - s/p dx laparoscopy, exploratory laparotomy, graham patch repair of gastric ulcer perforation; NGT placed to suction ?3/30 - NGT removed ?3/31 - s/p debridement R  clavicle and application of woundVAC ?4/02 - MRI spine redemonstrated osteomyelitis at T11-12 and L5-S1 ?5/2 - wound VAC removed ? ?Meal completion has been mostly 100%. Pt reports she has a good appetite with no difficulties. Pt currently has Ensure ordered with varied consumption. RD to continue with current orders to aid in caloric and protein needs.  ? ?Labs and medications reviewed.  ? ?Diet Order:   ?Diet Order   ? ?       ?  Diet regular Room service appropriate? Yes; Fluid consistency: Thin  Diet effective now       ?  ? ?  ?  ? ?  ? ? ?EDUCATION NEEDS:  ? ?Not appropriate for education at this time ? ?Skin:  Skin Assessment: Reviewed RN Assessment ?Skin Integrity Issues:: Wound VAC ?Wound Vac: N/A ?Incisions: throat, back, abdomen ? ?Last BM:  5/14 ? ?Height:  ? ?Ht Readings from Last 1 Encounters:  ?09/02/21 5' 7"  (1.702 m)  ? ? ?Weight:  ? ?Wt Readings from Last 1 Encounters:  ?09/02/21 77.1 kg  ? ?BMI:  Body mass index is 26.63 kg/m?. ? ?Estimated Nutritional Needs:  ? ?Kcal:  2200-2400 ? ?Protein:  110-125 grams ? ?Fluid:  >/= 2.2 L/day ? ?Corrin Parker, MS, RD, LDN ?RD pager number/after hours weekend pager number on Amion. ? ?

## 2021-10-21 NOTE — Progress Notes (Signed)
PROGRESS NOTE  Kimberly Booker  [id number] DOB: 04/22/1986 DOA: 08/26/2021 PCP: Pcp, No   Brief Narrative: 36 year old with past medical history significant for anxiety, depression, suicidal attempt drug overdose, polysubstance abuse , right clavicle fracture 8 months ago, she reported having occasional pain but a week ago and  was admitted at [facility] on  08/15/2021 due to right shoulder abscess.She was treated with I&D in the emergency department followed by IV antibiotics therapy as an inpatient.  She was diagnosed with MRSA bacteremia ,endocarditis was ruled out with a TEE performed 3/16,but she continued to have positive blood cultures despite treatment with ceftriaxone and vancomycin.  Antibiotic therapy was changed to daptomycin.  She was then transferred to [facility]. Extensive imagings have been done here which showed right clavicle osteomyelitis/phlegmon/abscess, widespread cervical,thoracic,lumbar discitis/osteomyelitis/epidural abscess/cord impingement.  MRI also showed bilateral iliopsoas abscess, right complex pleural effusion.  ID, orthopedics, neurosurgery following.    Underwent lumbar laminectomy / debridement of abscess, L4-L5 on 3/25.  Hospital course also remarkable for finding of right-sided empyema, gastric perforation.  Status post emergent laparotomy and perforation of repair by general surgery.  PCCM also following for chest tube on the right side,now removed.Underwent aspiration of the psoas abscesses on 3/30.  Underwent debridement of the right clavicle and application of wound VAC to the right shoulder area on 3/31.Developed severe lower back pain on 4/2.  MRI was done which showed improvement in the abscess.  Symptoms have improved, she is working with PT.  Now waiting to finish antibiotic course which will be completed on 5/26.  Assessment & Plan:  Principal Problem:   MRSA bacteremia Active Problems:   Abscess in epidural space of lumbar spine    Abscess in epidural space of cervical spine   Gastric perforation (HCC)   Iliopsoas abscess (HCC)   Pleural effusion   Normocytic anemia   Anxiety with depression   Pathologic fracture of right clavicle   Acute osteomyelitis of clavicle, right (HCC)   Polysubstance abuse (HCC)   Transaminitis   Pain management   MRSA bacteremia -Currently on vancomycin, end date 10/29/2021.  Has a PICC line.  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 Bilateral facet arthritis at L5-S1. Dorsal epidural abscess at L4 and L5,highly compressive on the thecal sac. -S/P  lumbar laminectomy, debridement of abscess, L4-L5 on 3/25. -No further interventions planned by neurosurgery.   -On 4/2 patient was complaining of severe pain in the lower back radiating down to her legs.  MRI lumbar redemonstrated the osteomyelitis at T11-12 and L5-S1.  Noted to be status post L4-L5 laminectomy and debridement with significant decrease in the amount of the abscess.  A small collection was noted posterior to the S1 causing moderate thecal sac narrowing.  Other findings of iliopsoas abscess and sacroiliac septic arthritis were noted as seen previously on MRI pelvis. These findings were discussed with neurosurgery.  Patient does not have any focal neurological deficits.  No intervention was recommended.  Patient able to ambulate and no focal deficits seen. -Continue with therapy and encourage ambulation as tolerated. Pt refusing lovenox.  -Wound care consulted for dressing change.  - Physical exam reassuring at this time.   Gastric perforation  Status post surgical repair, currently on soft diet, tolerating.  General surgery signed off   Iliopsoas abscess  Pelvic MRI showed bilateral infectious sacroiliitis and osteomyelitis of the sacrum and periarticular right iliac bone. Bilateral psoas  abscesses ,Small bilateral  paraspinal muscle abscesses,intramuscular abscess within the left piriformis muscle ,small intramuscular abscess within the left gluteus medius muscle. The plan is to continue antibiotics for these.   Pleural effusion Chest CT showed a large loculated right pleural effusion concerning for empyema, subcentimeter groundglass nodularity within the bilateral lungs concerning for septic emboli.  S/P chest tube placement,now removed.   Abscess in epidural space of cervical spine Discitis/osteomyelitis at C2-3, C5-6, and C6-7. Ventral epidural abscess at C2-3, C5, and C6 with cord impingement especially at the lower 2 levels. Facet arthritis on the left at C7-T1 and right at C3-4. -Also showed discitis/osteomyelitis of T9- T10, T11-T12 -Was seen by neurosurgery, no focal deficits at this time. -Continue with pain management. -Patient able to ambulate and participate in therapy at this time.   Normocytic anemia Monitor hb.  Currently stable.  Received 2 units PRBC outside facility.    Severe protein-calorie malnutrition  Nutrition is consulted   Polysubstance abuse: She is interested in methadone/Suboxone, she needs to follow-up with opioid addiction clinic as an outpatient Counseled for cessation.  TOC following   anxiety with depression On Prozac and Xanax   Pathologic fracture of right clavicle MRI of the shoulder showed osteomyelitis, phlegmon, possible abscess.  Status post shoulder washout          Nutrition Problem: Increased nutrient needs Etiology: acute illness, post-op healing, wound healing    DVT prophylaxis:SCDs Start: 09/03/21 1605 enoxaparin (LOVENOX) injection 40 mg Start: 08/29/21 1000 Place and maintain sequential compression device Start: 08/28/21 1049     Code Status: Full Code  Family Communication: None at bed side  Patient status:Inpatient  Patient is from :Home  Anticipated discharge [id number]  Estimated DC date:5/27   Consultants: ID,  orthopedics, neurosurgery  Procedures: As above Antimicrobials:  Anti-infectives (From admission, onward)    Start     Dose/Rate Route Frequency Ordered Stop   10/07/21 0100  vancomycin (VANCOREADY) IVPB 1250 mg/250 mL        1,250 mg 166.7 mL/hr over 90 Minutes Intravenous Every 12 hours 10/06/21 2358     10/06/21 0000  vancomycin (VANCOREADY) IVPB 1500 mg/300 mL  Status:  Discontinued        1,500 mg 150 mL/hr over 120 Minutes Intravenous Every 12 hours 10/05/21 1526 10/06/21 2358   10/05/21 1522  vancomycin variable dose per unstable renal function (pharmacist dosing)  Status:  Discontinued         Does not apply See admin instructions 10/05/21 1522 10/05/21 1527   10/01/21 2153  vancomycin (VANCOREADY) IVPB 1500 mg/300 mL  Status:  Discontinued        1,500 mg 150 mL/hr over 120 Minutes Intravenous Every 12 hours 10/01/21 2154 10/05/21 1522   09/30/21 1100  vancomycin (VANCOREADY) IVPB 750 mg/150 mL  Status:  Discontinued        750 mg 150 mL/hr over 60 Minutes Intravenous Every 8 hours 09/30/21 1007 10/01/21 2154   09/27/21 1200  vancomycin (VANCOCIN) IVPB 1000 mg/200 mL premix  Status:  Discontinued        1,000 mg 200 mL/hr over 60 Minutes Intravenous Every 12 hours 09/27/21 1137 09/30/21 1007   09/24/21 1145  fluconazole (DIFLUCAN) tablet 200 mg        200 mg Oral  Once 09/24/21 1059 09/24/21 1410   09/06/21 2200  vancomycin (VANCOREADY) IVPB 1500 mg/300 mL  Status:  Discontinued        1,500 mg 150 mL/hr over 120 Minutes  Intravenous Every 12 hours 09/06/21 1257 09/27/21 1137   09/03/21 1355  vancomycin (VANCOCIN) powder  Status:  Discontinued          As needed 09/03/21 1355 09/03/21 1426   09/01/21 2200  vancomycin (VANCOREADY) IVPB 1750 mg/350 mL  Status:  Discontinued        1,750 mg 175 mL/hr over 120 Minutes Intravenous Every 12 hours 09/01/21 1058 09/06/21 1257   08/30/21 1000  metroNIDAZOLE (FLAGYL) IVPB 500 mg  Status:  Discontinued        500 mg 100 mL/hr over 60  Minutes Intravenous Every 12 hours 08/30/21 0036 08/30/21 1708   08/30/21 0600  metroNIDAZOLE (FLAGYL) IVPB 500 mg  Status:  Discontinued        500 mg 100 mL/hr over 60 Minutes Intravenous On call to O.R. 08/30/21 0220 08/30/21 0229   08/30/21 0245  ciprofloxacin (CIPRO) IVPB 400 mg  Status:  Discontinued        400 mg 200 mL/hr over 60 Minutes Intravenous Every 12 hours 08/30/21 0037 08/30/21 1708   08/30/21 0045  cefTRIAXone (ROCEPHIN) 2 g in sodium chloride 0.9 % 100 mL IVPB  Status:  Discontinued        2 g 200 mL/hr over 30 Minutes Intravenous Every 24 hours 08/30/21 0036 08/30/21 0036   08/29/21 2315  metroNIDAZOLE (FLAGYL) IVPB 500 mg       Note to Pharmacy: TO MAIN OR STAT!! In surgery now   500 mg 100 mL/hr over 60 Minutes Intravenous To Surgery 08/29/21 2302 08/29/21 2319   08/29/21 2200  vancomycin (VANCOREADY) IVPB 1250 mg/250 mL  Status:  Discontinued        1,250 mg 166.7 mL/hr over 90 Minutes Intravenous Every 12 hours 08/29/21 1857 09/01/21 1058   08/29/21 2100  Vancomycin (VANCOCIN) 1,250 mg in sodium chloride 0.9 % 250 mL IVPB  Status:  Discontinued        1,250 mg 166.7 mL/hr over 90 Minutes Intravenous Every 12 hours 08/29/21 0837 08/29/21 1858   08/29/21 0930  vancomycin (VANCOCIN) 2,000 mg in sodium chloride 0.9 % 500 mL IVPB        2,000 mg 260 mL/hr over 120 Minutes Intravenous  Once 08/29/21 0837 08/29/21 1257   08/26/21 2000  cefTRIAXone (ROCEPHIN) 2 g in sodium chloride 0.9 % 100 mL IVPB  Status:  Discontinued        2 g 200 mL/hr over 30 Minutes Intravenous Every 24 hours 08/26/21 1614 08/28/21 1736   08/26/21 2000  DAPTOmycin (CUBICIN) 700 mg in sodium chloride 0.9 % IVPB  Status:  Discontinued        8 mg/kg  87.2 kg 128 mL/hr over 30 Minutes Intravenous Daily 08/26/21 1819 08/29/21 0842       Subjective: Patient seen and examined at the bedside this morning.  Lying in bed, no new complaints.  Pain well controlled  Objective: Vitals:   10/20/21  1225 10/20/21 2008 10/21/21 0517 10/21/21 0802  BP: (!) 94/59 100/66 (!) 91/52 (!) 92/56  Pulse: 68 82 65 65  Resp: 16   15  Temp: 98.2 F (36.8 C) 98.4 F (36.9 C) 98 F (36.7 C) 98.4 F (36.9 C)  TempSrc: Oral Oral Oral Oral  SpO2: 97% 97% 97% 98%  Weight:      Height:        Intake/Output Summary (Last 24 hours) at 10/21/2021 1238 Last data filed at 10/21/2021 0900 Gross per 24 hour  Intake 130 ml  Output --  Net 130 ml   Filed Weights   08/28/21 1059 08/29/21 1417 09/02/21 1010  Weight: 86.4 kg 86.4 kg 77.1 kg    Examination:  General exam: Overall comfortable, not in distress HEENT: PERRL Respiratory system:  no wheezes or crackles  Cardiovascular system: S1 & S2 heard, RRR.  Gastrointestinal system: Abdomen is nondistended, soft and nontender. Central nervous system: Alert and oriented Extremities: No edema, no clubbing ,no cyanosis,picc line on left arm Skin: No rashes, no ulcers,no icterus       Data Reviewed: I have personally reviewed following labs and imaging studies  CBC: Recent Labs  Lab 10/17/21 0418  WBC 6.3  NEUTROABS 2.9  HGB 8.2*  HCT 25.8*  MCV 85.7  PLT 380   Basic Metabolic Panel: Recent Labs  Lab 10/17/21 0418 10/20/21 0338  NA 136  --   K 4.0  --   CL 100  --   CO2 27  --   GLUCOSE 102*  --   BUN 9  --   CREATININE 0.45 0.41*  CALCIUM 9.1  --      No results found for this or any previous visit (from the past 240 hour(s)).   Radiology Studies: No results found.  Scheduled Meds:  bacitracin   Topical BID   Chlorhexidine Gluconate Cloth  6 each Topical Daily   enoxaparin (LOVENOX) injection  40 mg Subcutaneous Q24H   feeding supplement  237 mL Oral TID BM   fentaNYL  1 patch Transdermal Q72H   FLUoxetine  40 mg Oral Daily   gabapentin  200 mg Oral TID AC & HS   lidocaine  1 patch Transdermal Q24H   mouth rinse  15 mL Mouth Rinse BID   multivitamin with minerals  1 tablet Oral Daily   nicotine  21 mg Transdermal  Daily   pantoprazole  40 mg Oral BID   polyethylene glycol  17 g Oral Daily   senna-docusate  2 tablet Oral BID   sodium chloride flush  10-40 mL Intracatheter Q12H   Continuous Infusions:  sodium chloride 10 mL/hr at 09/06/21 0000   vancomycin 1,250 mg (10/21/21 1037)     LOS: 56 days   Burnadette Pop, MD [facility] P5/18/2023, 12:38 PM

## 2021-10-22 MED ORDER — FENTANYL 25 MCG/HR TD PT72
1.0000 | MEDICATED_PATCH | TRANSDERMAL | Status: DC
  Administered 2021-10-22: 1 via TRANSDERMAL
  Filled 2021-10-22: qty 1

## 2021-10-22 NOTE — Progress Notes (Signed)
PROGRESS NOTE  Kimberly Booker  [id number] DOB: 04/22/1986 DOA: 08/26/2021 PCP: Pcp, No   Brief Narrative: 36 year old with past medical history significant for anxiety, depression, suicidal attempt drug overdose, polysubstance abuse , right clavicle fracture 8 months ago, she reported having occasional pain but a week ago and  was admitted at [facility] on  08/15/2021 due to right shoulder abscess.She was treated with I&D in the emergency department followed by IV antibiotics therapy as an inpatient.  She was diagnosed with MRSA bacteremia ,endocarditis was ruled out with a TEE performed 3/16,but she continued to have positive blood cultures despite treatment with ceftriaxone and vancomycin.  Antibiotic therapy was changed to daptomycin.  She was then transferred to [facility]. Extensive imagings have been done here which showed right clavicle osteomyelitis/phlegmon/abscess, widespread cervical,thoracic,lumbar discitis/osteomyelitis/epidural abscess/cord impingement.  MRI also showed bilateral iliopsoas abscess, right complex pleural effusion.  ID, orthopedics, neurosurgery following.    Underwent lumbar laminectomy / debridement of abscess, L4-L5 on 3/25.  Hospital course also remarkable for finding of right-sided empyema, gastric perforation.  Status post emergent laparotomy and perforation of repair by general surgery.  PCCM also following for chest tube on the right side,now removed.Underwent aspiration of the psoas abscesses on 3/30.  Underwent debridement of the right clavicle and application of wound VAC to the right shoulder area on 3/31.Developed severe lower back pain on 4/2.  MRI was done which showed improvement in the abscess.  Symptoms have improved, she is working with PT.  Now waiting to finish antibiotic course which will be completed on 5/26.  Assessment & Plan:  Principal Problem:   MRSA bacteremia Active Problems:   Abscess in epidural space of lumbar spine    Abscess in epidural space of cervical spine   Gastric perforation (HCC)   Iliopsoas abscess (HCC)   Pleural effusion   Normocytic anemia   Anxiety with depression   Pathologic fracture of right clavicle   Acute osteomyelitis of clavicle, right (HCC)   Polysubstance abuse (HCC)   Transaminitis   Pain management   MRSA bacteremia -Currently on vancomycin, end date 10/29/2021.  Has a PICC line.  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 Bilateral facet arthritis at L5-S1. Dorsal epidural abscess at L4 and L5,highly compressive on the thecal sac. -S/P  lumbar laminectomy, debridement of abscess, L4-L5 on 3/25. -No further interventions planned by neurosurgery.   -On 4/2 patient was complaining of severe pain in the lower back radiating down to her legs.  MRI lumbar redemonstrated the osteomyelitis at T11-12 and L5-S1.  Noted to be status post L4-L5 laminectomy and debridement with significant decrease in the amount of the abscess.  A small collection was noted posterior to the S1 causing moderate thecal sac narrowing.  Other findings of iliopsoas abscess and sacroiliac septic arthritis were noted as seen previously on MRI pelvis. These findings were discussed with neurosurgery.  Patient does not have any focal neurological deficits.  No intervention was recommended.  Patient able to ambulate and no focal deficits seen. -Continue with therapy and encourage ambulation as tolerated. Pt refusing lovenox.  -Wound care consulted for dressing change.  - Physical exam reassuring at this time.   Gastric perforation  Status post surgical repair, currently on soft diet, tolerating.  General surgery signed off   Iliopsoas abscess  Pelvic MRI showed bilateral infectious sacroiliitis and osteomyelitis of the sacrum and periarticular right iliac bone. Bilateral psoas  abscesses ,Small bilateral  paraspinal muscle abscesses,intramuscular abscess within the left piriformis muscle ,small intramuscular abscess within the left gluteus medius muscle. The plan is to continue antibiotics for these.   Pleural effusion Chest CT showed a large loculated right pleural effusion concerning for empyema, subcentimeter groundglass nodularity within the bilateral lungs concerning for septic emboli.  S/P chest tube placement,now removed.   Abscess in epidural space of cervical spine Discitis/osteomyelitis at C2-3, C5-6, and C6-7. Ventral epidural abscess at C2-3, C5, and C6 with cord impingement especially at the lower 2 levels. Facet arthritis on the left at C7-T1 and right at C3-4. -Also showed discitis/osteomyelitis of T9- T10, T11-T12 -Was seen by neurosurgery, no focal deficits at this time. -Continue with pain management. -Patient able to ambulate and participate in therapy at this time.   Normocytic anemia Monitor hb.  Currently stable.  Received 2 units PRBC outside facility.    Severe protein-calorie malnutrition  Nutrition is consulted   Polysubstance abuse: She is interested in methadone/Suboxone, she needs to follow-up with opioid addiction clinic as an outpatient Counseled for cessation.  TOC following   anxiety with depression On Prozac and Xanax   Pathologic fracture of right clavicle MRI of the shoulder showed osteomyelitis, phlegmon, possible abscess.  Status post shoulder washout          Nutrition Problem: Increased nutrient needs Etiology: acute illness, post-op healing, wound healing    DVT prophylaxis:SCDs Start: 09/03/21 1605 enoxaparin (LOVENOX) injection 40 mg Start: 08/29/21 1000 Place and maintain sequential compression device Start: 08/28/21 1049     Code Status: Full Code  Family Communication: None at bed side  Patient status:Inpatient  Patient is from :Home  Anticipated discharge [id number]  Estimated DC date:5/27   Consultants: ID,  orthopedics, neurosurgery  Procedures: As above Antimicrobials:  Anti-infectives (From admission, onward)    Start     Dose/Rate Route Frequency Ordered Stop   10/07/21 0100  vancomycin (VANCOREADY) IVPB 1250 mg/250 mL        1,250 mg 166.7 mL/hr over 90 Minutes Intravenous Every 12 hours 10/06/21 2358     10/06/21 0000  vancomycin (VANCOREADY) IVPB 1500 mg/300 mL  Status:  Discontinued        1,500 mg 150 mL/hr over 120 Minutes Intravenous Every 12 hours 10/05/21 1526 10/06/21 2358   10/05/21 1522  vancomycin variable dose per unstable renal function (pharmacist dosing)  Status:  Discontinued         Does not apply See admin instructions 10/05/21 1522 10/05/21 1527   10/01/21 2153  vancomycin (VANCOREADY) IVPB 1500 mg/300 mL  Status:  Discontinued        1,500 mg 150 mL/hr over 120 Minutes Intravenous Every 12 hours 10/01/21 2154 10/05/21 1522   09/30/21 1100  vancomycin (VANCOREADY) IVPB 750 mg/150 mL  Status:  Discontinued        750 mg 150 mL/hr over 60 Minutes Intravenous Every 8 hours 09/30/21 1007 10/01/21 2154   09/27/21 1200  vancomycin (VANCOCIN) IVPB 1000 mg/200 mL premix  Status:  Discontinued        1,000 mg 200 mL/hr over 60 Minutes Intravenous Every 12 hours 09/27/21 1137 09/30/21 1007   09/24/21 1145  fluconazole (DIFLUCAN) tablet 200 mg        200 mg Oral  Once 09/24/21 1059 09/24/21 1410   09/06/21 2200  vancomycin (VANCOREADY) IVPB 1500 mg/300 mL  Status:  Discontinued        1,500 mg 150 mL/hr over 120 Minutes  Intravenous Every 12 hours 09/06/21 1257 09/27/21 1137   09/03/21 1355  vancomycin (VANCOCIN) powder  Status:  Discontinued          As needed 09/03/21 1355 09/03/21 1426   09/01/21 2200  vancomycin (VANCOREADY) IVPB 1750 mg/350 mL  Status:  Discontinued        1,750 mg 175 mL/hr over 120 Minutes Intravenous Every 12 hours 09/01/21 1058 09/06/21 1257   08/30/21 1000  metroNIDAZOLE (FLAGYL) IVPB 500 mg  Status:  Discontinued        500 mg 100 mL/hr over 60  Minutes Intravenous Every 12 hours 08/30/21 0036 08/30/21 1708   08/30/21 0600  metroNIDAZOLE (FLAGYL) IVPB 500 mg  Status:  Discontinued        500 mg 100 mL/hr over 60 Minutes Intravenous On call to O.R. 08/30/21 0220 08/30/21 0229   08/30/21 0245  ciprofloxacin (CIPRO) IVPB 400 mg  Status:  Discontinued        400 mg 200 mL/hr over 60 Minutes Intravenous Every 12 hours 08/30/21 0037 08/30/21 1708   08/30/21 0045  cefTRIAXone (ROCEPHIN) 2 g in sodium chloride 0.9 % 100 mL IVPB  Status:  Discontinued        2 g 200 mL/hr over 30 Minutes Intravenous Every 24 hours 08/30/21 0036 08/30/21 0036   08/29/21 2315  metroNIDAZOLE (FLAGYL) IVPB 500 mg       Note to Pharmacy: TO MAIN OR STAT!! In surgery now   500 mg 100 mL/hr over 60 Minutes Intravenous To Surgery 08/29/21 2302 08/29/21 2319   08/29/21 2200  vancomycin (VANCOREADY) IVPB 1250 mg/250 mL  Status:  Discontinued        1,250 mg 166.7 mL/hr over 90 Minutes Intravenous Every 12 hours 08/29/21 1857 09/01/21 1058   08/29/21 2100  Vancomycin (VANCOCIN) 1,250 mg in sodium chloride 0.9 % 250 mL IVPB  Status:  Discontinued        1,250 mg 166.7 mL/hr over 90 Minutes Intravenous Every 12 hours 08/29/21 0837 08/29/21 1858   08/29/21 0930  vancomycin (VANCOCIN) 2,000 mg in sodium chloride 0.9 % 500 mL IVPB        2,000 mg 260 mL/hr over 120 Minutes Intravenous  Once 08/29/21 0837 08/29/21 1257   08/26/21 2000  cefTRIAXone (ROCEPHIN) 2 g in sodium chloride 0.9 % 100 mL IVPB  Status:  Discontinued        2 g 200 mL/hr over 30 Minutes Intravenous Every 24 hours 08/26/21 1614 08/28/21 1736   08/26/21 2000  DAPTOmycin (CUBICIN) 700 mg in sodium chloride 0.9 % IVPB  Status:  Discontinued        8 mg/kg  87.2 kg 128 mL/hr over 30 Minutes Intravenous Daily 08/26/21 1819 08/29/21 0842       Subjective: Patient seen and examined at bedside this morning.  Hemodynamically stable without any complaints today.  Objective: Vitals:   10/21/21 0802  10/21/21 1446 10/21/21 2006 10/22/21 0656  BP: (!) 92/56 104/64 100/62 97/60  Pulse: 65 78 66 61  Resp: 15 17 16 16   Temp: 98.4 F (36.9 C) 98.8 F (37.1 C) 99.1 F (37.3 C) 97.9 F (36.6 C)  TempSrc: Oral Oral Oral Oral  SpO2: 98% 97% 99% 97%  Weight:      Height:        Intake/Output Summary (Last 24 hours) at 10/22/2021 1118 Last data filed at 10/21/2021 2155 Gross per 24 hour  Intake 490 ml  Output --  Net 490 ml  Filed Weights   08/28/21 1059 08/29/21 1417 09/02/21 1010  Weight: 86.4 kg 86.4 kg 77.1 kg    Examination:  General exam: Overall comfortable, not in distress HEENT: PERRL Respiratory system:  no wheezes or crackles  Cardiovascular system: S1 & S2 heard, RRR.  Gastrointestinal system: Abdomen is nondistended, soft and nontender. Central nervous system: Alert and oriented Extremities: No edema, no clubbing ,no cyanosis,picc line  Skin: No rashes, no ulcers,no icterus     Data Reviewed: I have personally reviewed following labs and imaging studies  CBC: Recent Labs  Lab 10/17/21 0418  WBC 6.3  NEUTROABS 2.9  HGB 8.2*  HCT 25.8*  MCV 85.7  PLT 380   Basic Metabolic Panel: Recent Labs  Lab 10/17/21 0418 10/20/21 0338  NA 136  --   K 4.0  --   CL 100  --   CO2 27  --   GLUCOSE 102*  --   BUN 9  --   CREATININE 0.45 0.41*  CALCIUM 9.1  --      No results found for this or any previous visit (from the past 240 hour(s)).   Radiology Studies: No results found.  Scheduled Meds:  bacitracin   Topical BID   Chlorhexidine Gluconate Cloth  6 each Topical Daily   enoxaparin (LOVENOX) injection  40 mg Subcutaneous Q24H   feeding supplement  237 mL Oral TID BM   FLUoxetine  40 mg Oral Daily   gabapentin  200 mg Oral TID AC & HS   lidocaine  1 patch Transdermal Q24H   mouth rinse  15 mL Mouth Rinse BID   multivitamin with minerals  1 tablet Oral Daily   nicotine  21 mg Transdermal Daily   pantoprazole  40 mg Oral BID   polyethylene  glycol  17 g Oral Daily   senna-docusate  2 tablet Oral BID   sodium chloride flush  10-40 mL Intracatheter Q12H   Continuous Infusions:  sodium chloride 10 mL/hr at 09/06/21 0000   vancomycin 1,250 mg (10/22/21 1104)     LOS: 57 days   Burnadette Pop, MD [facility] P5/19/2023, 11:18 AM

## 2021-10-23 NOTE — Progress Notes (Signed)
Mobility Specialist Progress Note   10/23/21 1226  Mobility  Activity Refused mobility   Stating to be too tired and said they will walk w/ their visitors later today.  Holland Falling Mobility Specialist Phone Number [phone]

## 2021-10-23 NOTE — Plan of Care (Signed)

## 2021-10-23 NOTE — Progress Notes (Signed)
PROGRESS NOTE  Kimberly Booker  [id number] DOB: 04/22/1986 DOA: 08/26/2021 PCP: Pcp, No   Brief Narrative: 36 year old with past medical history significant for anxiety, depression, suicidal attempt drug overdose, polysubstance abuse , right clavicle fracture 8 months ago, she reported having occasional pain but a week ago and  was admitted at [facility] on  08/15/2021 due to right shoulder abscess.She was treated with I&D in the emergency department followed by IV antibiotics therapy as an inpatient.  She was diagnosed with MRSA bacteremia ,endocarditis was ruled out with a TEE performed 3/16,but she continued to have positive blood cultures despite treatment with ceftriaxone and vancomycin.  Antibiotic therapy was changed to daptomycin.  She was then transferred to [facility]. Extensive imagings have been done here which showed right clavicle osteomyelitis/phlegmon/abscess, widespread cervical,thoracic,lumbar discitis/osteomyelitis/epidural abscess/cord impingement.  MRI also showed bilateral iliopsoas abscess, right complex pleural effusion.  ID, orthopedics, neurosurgery following.    Underwent lumbar laminectomy / debridement of abscess, L4-L5 on 3/25.  Hospital course also remarkable for finding of right-sided empyema, gastric perforation.  Status post emergent laparotomy and perforation of repair by general surgery.  PCCM also following for chest tube on the right side,now removed.Underwent aspiration of the psoas abscesses on 3/30.  Underwent debridement of the right clavicle and application of wound VAC to the right shoulder area on 3/31.Developed severe lower back pain on 4/2.  MRI was done which showed improvement in the abscess.  Symptoms have improved, she is working with PT.  Now waiting to finish antibiotic course which will be completed on 5/26.  Assessment & Plan:  Principal Problem:   MRSA bacteremia Active Problems:   Abscess in epidural space of lumbar spine    Abscess in epidural space of cervical spine   Gastric perforation (HCC)   Iliopsoas abscess (HCC)   Pleural effusion   Normocytic anemia   Anxiety with depression   Pathologic fracture of right clavicle   Acute osteomyelitis of clavicle, right (HCC)   Polysubstance abuse (HCC)   Transaminitis   Pain management   MRSA bacteremia -Currently on vancomycin, end date 10/29/2021.  Has a PICC line.  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 Bilateral facet arthritis at L5-S1. Dorsal epidural abscess at L4 and L5,highly compressive on the thecal sac. -S/P  lumbar laminectomy, debridement of abscess, L4-L5 on 3/25. -No further interventions planned by neurosurgery.   -On 4/2 patient was complaining of severe pain in the lower back radiating down to her legs.  MRI lumbar redemonstrated the osteomyelitis at T11-12 and L5-S1.  Noted to be status post L4-L5 laminectomy and debridement with significant decrease in the amount of the abscess.  A small collection was noted posterior to the S1 causing moderate thecal sac narrowing.  Other findings of iliopsoas abscess and sacroiliac septic arthritis were noted as seen previously on MRI pelvis. These findings were discussed with neurosurgery.  Patient does not have any focal neurological deficits.  No intervention was recommended.  Patient able to ambulate and no focal deficits seen. -Continue with therapy and encourage ambulation as tolerated. Pt refusing lovenox.  -Wound care consulted for dressing change.  - Physical exam reassuring at this time.   Gastric perforation  Status post surgical repair, currently on soft diet, tolerating.  General surgery signed off   Iliopsoas abscess  Pelvic MRI showed bilateral infectious sacroiliitis and osteomyelitis of the sacrum and periarticular right iliac bone. Bilateral psoas  abscesses ,Small bilateral  paraspinal muscle abscesses,intramuscular abscess within the left piriformis muscle ,small intramuscular abscess within the left gluteus medius muscle. The plan is to continue antibiotics for these.   Pleural effusion Chest CT showed a large loculated right pleural effusion concerning for empyema, subcentimeter groundglass nodularity within the bilateral lungs concerning for septic emboli.  S/P chest tube placement,now removed.   Abscess in epidural space of cervical spine Discitis/osteomyelitis at C2-3, C5-6, and C6-7. Ventral epidural abscess at C2-3, C5, and C6 with cord impingement especially at the lower 2 levels. Facet arthritis on the left at C7-T1 and right at C3-4. -Also showed discitis/osteomyelitis of T9- T10, T11-T12 -Was seen by neurosurgery, no focal deficits at this time. -Continue with pain management. -Patient able to ambulate and participate in therapy at this time.   Normocytic anemia Monitor hb.  Currently stable.  Received 2 units PRBC outside facility.    Severe protein-calorie malnutrition  Nutrition is consulted   Polysubstance abuse: She is interested in methadone/Suboxone, she needs to follow-up with opioid addiction clinic as an outpatient Counseled for cessation.  TOC following   anxiety with depression On Prozac and Xanax   Pathologic fracture of right clavicle MRI of the shoulder showed osteomyelitis, phlegmon, possible abscess.  Status post shoulder washout          Nutrition Problem: Increased nutrient needs Etiology: acute illness, post-op healing, wound healing    DVT prophylaxis:SCDs Start: 09/03/21 1605 enoxaparin (LOVENOX) injection 40 mg Start: 08/29/21 1000 Place and maintain sequential compression device Start: 08/28/21 1049     Code Status: Full Code  Family Communication: None at bed side  Patient status:Inpatient  Patient is from :Home  Anticipated discharge [id number]  Estimated DC date:5/27   Consultants: ID,  orthopedics, neurosurgery  Procedures: As above Antimicrobials:  Anti-infectives (From admission, onward)    Start     Dose/Rate Route Frequency Ordered Stop   10/07/21 0100  vancomycin (VANCOREADY) IVPB 1250 mg/250 mL        1,250 mg 166.7 mL/hr over 90 Minutes Intravenous Every 12 hours 10/06/21 2358     10/06/21 0000  vancomycin (VANCOREADY) IVPB 1500 mg/300 mL  Status:  Discontinued        1,500 mg 150 mL/hr over 120 Minutes Intravenous Every 12 hours 10/05/21 1526 10/06/21 2358   10/05/21 1522  vancomycin variable dose per unstable renal function (pharmacist dosing)  Status:  Discontinued         Does not apply See admin instructions 10/05/21 1522 10/05/21 1527   10/01/21 2153  vancomycin (VANCOREADY) IVPB 1500 mg/300 mL  Status:  Discontinued        1,500 mg 150 mL/hr over 120 Minutes Intravenous Every 12 hours 10/01/21 2154 10/05/21 1522   09/30/21 1100  vancomycin (VANCOREADY) IVPB 750 mg/150 mL  Status:  Discontinued        750 mg 150 mL/hr over 60 Minutes Intravenous Every 8 hours 09/30/21 1007 10/01/21 2154   09/27/21 1200  vancomycin (VANCOCIN) IVPB 1000 mg/200 mL premix  Status:  Discontinued        1,000 mg 200 mL/hr over 60 Minutes Intravenous Every 12 hours 09/27/21 1137 09/30/21 1007   09/24/21 1145  fluconazole (DIFLUCAN) tablet 200 mg        200 mg Oral  Once 09/24/21 1059 09/24/21 1410   09/06/21 2200  vancomycin (VANCOREADY) IVPB 1500 mg/300 mL  Status:  Discontinued        1,500 mg 150 mL/hr over 120 Minutes  Intravenous Every 12 hours 09/06/21 1257 09/27/21 1137   09/03/21 1355  vancomycin (VANCOCIN) powder  Status:  Discontinued          As needed 09/03/21 1355 09/03/21 1426   09/01/21 2200  vancomycin (VANCOREADY) IVPB 1750 mg/350 mL  Status:  Discontinued        1,750 mg 175 mL/hr over 120 Minutes Intravenous Every 12 hours 09/01/21 1058 09/06/21 1257   08/30/21 1000  metroNIDAZOLE (FLAGYL) IVPB 500 mg  Status:  Discontinued        500 mg 100 mL/hr over 60  Minutes Intravenous Every 12 hours 08/30/21 0036 08/30/21 1708   08/30/21 0600  metroNIDAZOLE (FLAGYL) IVPB 500 mg  Status:  Discontinued        500 mg 100 mL/hr over 60 Minutes Intravenous On call to O.R. 08/30/21 0220 08/30/21 0229   08/30/21 0245  ciprofloxacin (CIPRO) IVPB 400 mg  Status:  Discontinued        400 mg 200 mL/hr over 60 Minutes Intravenous Every 12 hours 08/30/21 0037 08/30/21 1708   08/30/21 0045  cefTRIAXone (ROCEPHIN) 2 g in sodium chloride 0.9 % 100 mL IVPB  Status:  Discontinued        2 g 200 mL/hr over 30 Minutes Intravenous Every 24 hours 08/30/21 0036 08/30/21 0036   08/29/21 2315  metroNIDAZOLE (FLAGYL) IVPB 500 mg       Note to Pharmacy: TO MAIN OR STAT!! In surgery now   500 mg 100 mL/hr over 60 Minutes Intravenous To Surgery 08/29/21 2302 08/29/21 2319   08/29/21 2200  vancomycin (VANCOREADY) IVPB 1250 mg/250 mL  Status:  Discontinued        1,250 mg 166.7 mL/hr over 90 Minutes Intravenous Every 12 hours 08/29/21 1857 09/01/21 1058   08/29/21 2100  Vancomycin (VANCOCIN) 1,250 mg in sodium chloride 0.9 % 250 mL IVPB  Status:  Discontinued        1,250 mg 166.7 mL/hr over 90 Minutes Intravenous Every 12 hours 08/29/21 0837 08/29/21 1858   08/29/21 0930  vancomycin (VANCOCIN) 2,000 mg in sodium chloride 0.9 % 500 mL IVPB        2,000 mg 260 mL/hr over 120 Minutes Intravenous  Once 08/29/21 0837 08/29/21 1257   08/26/21 2000  cefTRIAXone (ROCEPHIN) 2 g in sodium chloride 0.9 % 100 mL IVPB  Status:  Discontinued        2 g 200 mL/hr over 30 Minutes Intravenous Every 24 hours 08/26/21 1614 08/28/21 1736   08/26/21 2000  DAPTOmycin (CUBICIN) 700 mg in sodium chloride 0.9 % IVPB  Status:  Discontinued        8 mg/kg  87.2 kg 128 mL/hr over 30 Minutes Intravenous Daily 08/26/21 1819 08/29/21 0842       Subjective: Patient seen and examined at the bedside this morning.  Hemodynamically stable.  Denies any new complaints today.  She agreed to discontinue  fentanyl patch.  Objective: Vitals:   10/22/21 1304 10/22/21 2013 10/23/21 0450 10/23/21 0807  BP: 97/64 (!) 103/59 (!) 90/51 (!) 94/57  Pulse: 66 71 61 (!) 59  Resp: 17 18 14 17   Temp: (!) 97.3 F (36.3 C) 97.9 F (36.6 C) 98 F (36.7 C) 98 F (36.7 C)  TempSrc: Oral Oral Oral Oral  SpO2: 97% 97% 97% 96%  Weight:      Height:       No intake or output data in the 24 hours ending 10/23/21 1124  Filed 10/25/21  08/28/21 1059 08/29/21 1417 09/02/21 1010  Weight: 86.4 kg 86.4 kg 77.1 kg    Examination:  General exam: Overall comfortable, not in distress HEENT: PERRL Respiratory system:  no wheezes or crackles  Cardiovascular system: S1 & S2 heard, RRR.  Gastrointestinal system: Abdomen is nondistended, soft and nontender. Central nervous system: Alert and oriented Extremities: No edema, no clubbing ,no cyanosis Skin: No rashes, no ulcers,no icterus      Data Reviewed: I have personally reviewed following labs and imaging studies  CBC: Recent Labs  Lab 10/17/21 0418  WBC 6.3  NEUTROABS 2.9  HGB 8.2*  HCT 25.8*  MCV 85.7  PLT 380   Basic Metabolic Panel: Recent Labs  Lab 10/17/21 0418 10/20/21 0338  NA 136  --   K 4.0  --   CL 100  --   CO2 27  --   GLUCOSE 102*  --   BUN 9  --   CREATININE 0.45 0.41*  CALCIUM 9.1  --      No results found for this or any previous visit (from the past 240 hour(s)).   Radiology Studies: No results found.  Scheduled Meds:  bacitracin   Topical BID   Chlorhexidine Gluconate Cloth  6 each Topical Daily   enoxaparin (LOVENOX) injection  40 mg Subcutaneous Q24H   feeding supplement  237 mL Oral TID BM   fentaNYL  1 patch Transdermal Q72H   FLUoxetine  40 mg Oral Daily   gabapentin  200 mg Oral TID AC & HS   lidocaine  1 patch Transdermal Q24H   mouth rinse  15 mL Mouth Rinse BID   multivitamin with minerals  1 tablet Oral Daily   nicotine  21 mg Transdermal Daily   pantoprazole  40 mg Oral BID   polyethylene  glycol  17 g Oral Daily   senna-docusate  2 tablet Oral BID   sodium chloride flush  10-40 mL Intracatheter Q12H   Continuous Infusions:  sodium chloride 10 mL/hr at 09/06/21 0000   vancomycin 1,250 mg (10/23/21 0951)     LOS: 58 days   Burnadette Pop, MD [facility] P5/20/2023, 11:24 AM

## 2021-10-24 NOTE — Progress Notes (Signed)
PROGRESS NOTE  Kimberly Booker  [id number] DOB: 04/22/1986 DOA: 08/26/2021 PCP: Pcp, No   Brief Narrative: 36 year old with past medical history significant for anxiety, depression, suicidal attempt drug overdose, polysubstance abuse , right clavicle fracture 8 months ago, she reported having occasional pain but a week ago and  was admitted at [facility] on  08/15/2021 due to right shoulder abscess.She was treated with I&D in the emergency department followed by IV antibiotics therapy as an inpatient.  She was diagnosed with MRSA bacteremia ,endocarditis was ruled out with a TEE performed 3/16,but she continued to have positive blood cultures despite treatment with ceftriaxone and vancomycin.  Antibiotic therapy was changed to daptomycin.  She was then transferred to [facility]. Extensive imagings have been done here which showed right clavicle osteomyelitis/phlegmon/abscess, widespread cervical,thoracic,lumbar discitis/osteomyelitis/epidural abscess/cord impingement.  MRI also showed bilateral iliopsoas abscess, right complex pleural effusion.  ID, orthopedics, neurosurgery following.    Underwent lumbar laminectomy / debridement of abscess, L4-L5 on 3/25.  Hospital course also remarkable for finding of right-sided empyema, gastric perforation.  Status post emergent laparotomy and perforation of repair by general surgery.  PCCM also following for chest tube on the right side,now removed.Underwent aspiration of the psoas abscesses on 3/30.  Underwent debridement of the right clavicle and application of wound VAC to the right shoulder area on 3/31.Developed severe lower back pain on 4/2.  MRI was done which showed improvement in the abscess.  Symptoms have improved, she is working with PT.  Now waiting to finish antibiotic course which will be completed on 5/26.  Assessment & Plan:  Principal Problem:   MRSA bacteremia Active Problems:   Abscess in epidural space of lumbar spine    Abscess in epidural space of cervical spine   Gastric perforation (HCC)   Iliopsoas abscess (HCC)   Pleural effusion   Normocytic anemia   Anxiety with depression   Pathologic fracture of right clavicle   Acute osteomyelitis of clavicle, right (HCC)   Polysubstance abuse (HCC)   Transaminitis   Pain management   MRSA bacteremia -Currently on vancomycin, end date 10/29/2021.  Has a PICC line.  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 Bilateral facet arthritis at L5-S1. Dorsal epidural abscess at L4 and L5,highly compressive on the thecal sac. -S/P  lumbar laminectomy, debridement of abscess, L4-L5 on 3/25. -No further interventions planned by neurosurgery.   -On 4/2 patient was complaining of severe pain in the lower back radiating down to her legs.  MRI lumbar redemonstrated the osteomyelitis at T11-12 and L5-S1.  Noted to be status post L4-L5 laminectomy and debridement with significant decrease in the amount of the abscess.  A small collection was noted posterior to the S1 causing moderate thecal sac narrowing.  Other findings of iliopsoas abscess and sacroiliac septic arthritis were noted as seen previously on MRI pelvis. These findings were discussed with neurosurgery.  Patient does not have any focal neurological deficits.  No intervention was recommended.  Patient able to ambulate and no focal deficits seen. -Continue with therapy and encourage ambulation as tolerated. Pt refusing lovenox.  -Wound care consulted for dressing change.  - Physical exam reassuring at this time.   Gastric perforation  Status post surgical repair, currently on soft diet, tolerating.  General surgery signed off   Iliopsoas abscess  Pelvic MRI showed bilateral infectious sacroiliitis and osteomyelitis of the sacrum and periarticular right iliac bone. Bilateral psoas  abscesses ,Small bilateral  paraspinal muscle abscesses,intramuscular abscess within the left piriformis muscle ,small intramuscular abscess within the left gluteus medius muscle. The plan is to continue antibiotics for these.   Pleural effusion Chest CT showed a large loculated right pleural effusion concerning for empyema, subcentimeter groundglass nodularity within the bilateral lungs concerning for septic emboli.  S/P chest tube placement,now removed.   Abscess in epidural space of cervical spine Discitis/osteomyelitis at C2-3, C5-6, and C6-7. Ventral epidural abscess at C2-3, C5, and C6 with cord impingement especially at the lower 2 levels. Facet arthritis on the left at C7-T1 and right at C3-4. -Also showed discitis/osteomyelitis of T9- T10, T11-T12 -Was seen by neurosurgery, no focal deficits at this time. -Continue with pain management. -Patient able to ambulate and participate in therapy at this time.   Normocytic anemia Monitor hb.  Currently stable.  Received 2 units PRBC outside facility.    Severe protein-calorie malnutrition  Nutrition is consulted   Polysubstance abuse: She is interested in methadone/Suboxone, she needs to follow-up with opioid addiction clinic as an outpatient Counseled for cessation.  TOC following   anxiety with depression On Prozac and Xanax   Pathologic fracture of right clavicle MRI of the shoulder showed osteomyelitis, phlegmon, possible abscess.  Status post shoulder washout          Nutrition Problem: Increased nutrient needs Etiology: acute illness, post-op healing, wound healing    DVT prophylaxis:SCDs Start: 09/03/21 1605 enoxaparin (LOVENOX) injection 40 mg Start: 08/29/21 1000 Place and maintain sequential compression device Start: 08/28/21 1049     Code Status: Full Code  Family Communication: None at bed side  Patient status:Inpatient  Patient is from :Home  Anticipated discharge [id number]  Estimated DC date:5/27   Consultants: ID,  orthopedics, neurosurgery  Procedures: As above Antimicrobials:  Anti-infectives (From admission, onward)    Start     Dose/Rate Route Frequency Ordered Stop   10/07/21 0100  vancomycin (VANCOREADY) IVPB 1250 mg/250 mL        1,250 mg 166.7 mL/hr over 90 Minutes Intravenous Every 12 hours 10/06/21 2358     10/06/21 0000  vancomycin (VANCOREADY) IVPB 1500 mg/300 mL  Status:  Discontinued        1,500 mg 150 mL/hr over 120 Minutes Intravenous Every 12 hours 10/05/21 1526 10/06/21 2358   10/05/21 1522  vancomycin variable dose per unstable renal function (pharmacist dosing)  Status:  Discontinued         Does not apply See admin instructions 10/05/21 1522 10/05/21 1527   10/01/21 2153  vancomycin (VANCOREADY) IVPB 1500 mg/300 mL  Status:  Discontinued        1,500 mg 150 mL/hr over 120 Minutes Intravenous Every 12 hours 10/01/21 2154 10/05/21 1522   09/30/21 1100  vancomycin (VANCOREADY) IVPB 750 mg/150 mL  Status:  Discontinued        750 mg 150 mL/hr over 60 Minutes Intravenous Every 8 hours 09/30/21 1007 10/01/21 2154   09/27/21 1200  vancomycin (VANCOCIN) IVPB 1000 mg/200 mL premix  Status:  Discontinued        1,000 mg 200 mL/hr over 60 Minutes Intravenous Every 12 hours 09/27/21 1137 09/30/21 1007   09/24/21 1145  fluconazole (DIFLUCAN) tablet 200 mg        200 mg Oral  Once 09/24/21 1059 09/24/21 1410   09/06/21 2200  vancomycin (VANCOREADY) IVPB 1500 mg/300 mL  Status:  Discontinued        1,500 mg 150 mL/hr over 120 Minutes  Intravenous Every 12 hours 09/06/21 1257 09/27/21 1137   09/03/21 1355  vancomycin (VANCOCIN) powder  Status:  Discontinued          As needed 09/03/21 1355 09/03/21 1426   09/01/21 2200  vancomycin (VANCOREADY) IVPB 1750 mg/350 mL  Status:  Discontinued        1,750 mg 175 mL/hr over 120 Minutes Intravenous Every 12 hours 09/01/21 1058 09/06/21 1257   08/30/21 1000  metroNIDAZOLE (FLAGYL) IVPB 500 mg  Status:  Discontinued        500 mg 100 mL/hr over 60  Minutes Intravenous Every 12 hours 08/30/21 0036 08/30/21 1708   08/30/21 0600  metroNIDAZOLE (FLAGYL) IVPB 500 mg  Status:  Discontinued        500 mg 100 mL/hr over 60 Minutes Intravenous On call to O.R. 08/30/21 0220 08/30/21 0229   08/30/21 0245  ciprofloxacin (CIPRO) IVPB 400 mg  Status:  Discontinued        400 mg 200 mL/hr over 60 Minutes Intravenous Every 12 hours 08/30/21 0037 08/30/21 1708   08/30/21 0045  cefTRIAXone (ROCEPHIN) 2 g in sodium chloride 0.9 % 100 mL IVPB  Status:  Discontinued        2 g 200 mL/hr over 30 Minutes Intravenous Every 24 hours 08/30/21 0036 08/30/21 0036   08/29/21 2315  metroNIDAZOLE (FLAGYL) IVPB 500 mg       Note to Pharmacy: TO MAIN OR STAT!! In surgery now   500 mg 100 mL/hr over 60 Minutes Intravenous To Surgery 08/29/21 2302 08/29/21 2319   08/29/21 2200  vancomycin (VANCOREADY) IVPB 1250 mg/250 mL  Status:  Discontinued        1,250 mg 166.7 mL/hr over 90 Minutes Intravenous Every 12 hours 08/29/21 1857 09/01/21 1058   08/29/21 2100  Vancomycin (VANCOCIN) 1,250 mg in sodium chloride 0.9 % 250 mL IVPB  Status:  Discontinued        1,250 mg 166.7 mL/hr over 90 Minutes Intravenous Every 12 hours 08/29/21 0837 08/29/21 1858   08/29/21 0930  vancomycin (VANCOCIN) 2,000 mg in sodium chloride 0.9 % 500 mL IVPB        2,000 mg 260 mL/hr over 120 Minutes Intravenous  Once 08/29/21 0837 08/29/21 1257   08/26/21 2000  cefTRIAXone (ROCEPHIN) 2 g in sodium chloride 0.9 % 100 mL IVPB  Status:  Discontinued        2 g 200 mL/hr over 30 Minutes Intravenous Every 24 hours 08/26/21 1614 08/28/21 1736   08/26/21 2000  DAPTOmycin (CUBICIN) 700 mg in sodium chloride 0.9 % IVPB  Status:  Discontinued        8 mg/kg  87.2 kg 128 mL/hr over 30 Minutes Intravenous Daily 08/26/21 1819 08/29/21 0842       Subjective: Patient seen and examined at bedside this morning.  Hemodynamically stable without any complaints today  Objective: Vitals:   10/23/21 2300  10/24/21 0105 10/24/21 0447 10/24/21 0700  BP: 118/88 109/71 107/68 113/62  Pulse: (!) 115 88 (!) 102 78  Resp: 16   20  Temp: 98.1 F (36.7 C) 99.1 F (37.3 C) 99.2 F (37.3 C) 98.9 F (37.2 C)  TempSrc: Oral Oral Oral Oral  SpO2: 94% 100% 99% 98%  Weight:      Height:       No intake or output data in the 24 hours ending 10/24/21 1136  Filed Weights   08/28/21 1059 08/29/21 1417 09/02/21 1010  Weight: 86.4 kg 86.4 kg 77.1  kg    Examination:  General exam: Overall comfortable, not in distress HEENT: PERRL Respiratory system:  no wheezes or crackles  Cardiovascular system: S1 & S2 heard, RRR.  Gastrointestinal system: Abdomen is nondistended, soft and nontender. Central nervous system: Alert and oriented Extremities: No edema, no clubbing ,no cyanosis Skin: No rashes, no ulcers,no icterus      Data Reviewed: I have personally reviewed following labs and imaging studies  CBC: No results for input(s): WBC, NEUTROABS, HGB, HCT, MCV, PLT in the last 168 hours.  Basic Metabolic Panel: Recent Labs  Lab 10/20/21 0338  CREATININE 0.41*     No results found for this or any previous visit (from the past 240 hour(s)).   Radiology Studies: No results found.  Scheduled Meds:  bacitracin   Topical BID   Chlorhexidine Gluconate Cloth  6 each Topical Daily   enoxaparin (LOVENOX) injection  40 mg Subcutaneous Q24H   feeding supplement  237 mL Oral TID BM   FLUoxetine  40 mg Oral Daily   gabapentin  200 mg Oral TID AC & HS   lidocaine  1 patch Transdermal Q24H   mouth rinse  15 mL Mouth Rinse BID   multivitamin with minerals  1 tablet Oral Daily   nicotine  21 mg Transdermal Daily   pantoprazole  40 mg Oral BID   polyethylene glycol  17 g Oral Daily   senna-docusate  2 tablet Oral BID   sodium chloride flush  10-40 mL Intracatheter Q12H   Continuous Infusions:  sodium chloride 10 mL/hr at 10/23/21 2200   vancomycin 1,250 mg (10/24/21 0829)     LOS: 59 days    Burnadette Pop, MD [facility] P5/21/2023, 11:36 AM

## 2021-10-25 NOTE — Progress Notes (Signed)
PROGRESS NOTE  Kimberly Booker  [id number] DOB: 04/22/1986 DOA: 08/26/2021 PCP: Pcp, No   Brief Narrative: 36 year old with past medical history significant for anxiety, depression, suicidal attempt drug overdose, polysubstance abuse , right clavicle fracture 8 months ago, she reported having occasional pain but a week ago and  was admitted at [facility] on  08/15/2021 due to right shoulder abscess.She was treated with I&D in the emergency department followed by IV antibiotics therapy as an inpatient.  She was diagnosed with MRSA bacteremia ,endocarditis was ruled out with a TEE performed 3/16,but she continued to have positive blood cultures despite treatment with ceftriaxone and vancomycin.  Antibiotic therapy was changed to daptomycin.  She was then transferred to [facility]. Extensive imagings have been done here which showed right clavicle osteomyelitis/phlegmon/abscess, widespread cervical,thoracic,lumbar discitis/osteomyelitis/epidural abscess/cord impingement.  MRI also showed bilateral iliopsoas abscess, right complex pleural effusion.  ID, orthopedics, neurosurgery following.    Underwent lumbar laminectomy / debridement of abscess, L4-L5 on 3/25.  Hospital course also remarkable for finding of right-sided empyema, gastric perforation.  Status post emergent laparotomy and perforation of repair by general surgery.  PCCM also following for chest tube on the right side,now removed.Underwent aspiration of the psoas abscesses on 3/30.  Underwent debridement of the right clavicle and application of wound VAC to the right shoulder area on 3/31.Developed severe lower back pain on 4/2.  MRI was done which showed improvement in the abscess.  Symptoms have improved, she is working with PT.  Now waiting to finish antibiotic course which will be completed on 5/26.  Assessment & Plan:  Principal Problem:   MRSA bacteremia Active Problems:   Abscess in epidural space of lumbar spine    Abscess in epidural space of cervical spine   Gastric perforation (HCC)   Iliopsoas abscess (HCC)   Pleural effusion   Normocytic anemia   Anxiety with depression   Pathologic fracture of right clavicle   Acute osteomyelitis of clavicle, right (HCC)   Polysubstance abuse (HCC)   Transaminitis   Pain management   MRSA bacteremia -Currently on vancomycin, end date 10/29/2021.  Has a PICC line.  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 Bilateral facet arthritis at L5-S1. Dorsal epidural abscess at L4 and L5,highly compressive on the thecal sac. -S/P  lumbar laminectomy, debridement of abscess, L4-L5 on 3/25. -No further interventions planned by neurosurgery.   -On 4/2 patient was complaining of severe pain in the lower back radiating down to her legs.  MRI lumbar redemonstrated the osteomyelitis at T11-12 and L5-S1.  Noted to be status post L4-L5 laminectomy and debridement with significant decrease in the amount of the abscess.  A small collection was noted posterior to the S1 causing moderate thecal sac narrowing.  Other findings of iliopsoas abscess and sacroiliac septic arthritis were noted as seen previously on MRI pelvis. These findings were discussed with neurosurgery.  Patient does not have any focal neurological deficits.  No intervention was recommended.  Patient able to ambulate and no focal deficits seen. -Continue with therapy and encourage ambulation as tolerated. Pt refusing lovenox.  -Wound care consulted for dressing change.  - Physical exam reassuring at this time.   Gastric perforation  Status post surgical repair, currently on soft diet, tolerating.  General surgery signed off   Iliopsoas abscess  Pelvic MRI showed bilateral infectious sacroiliitis and osteomyelitis of the sacrum and periarticular right iliac bone. Bilateral psoas  abscesses ,Small bilateral  paraspinal muscle abscesses,intramuscular abscess within the left piriformis muscle ,small intramuscular abscess within the left gluteus medius muscle. The plan is to continue antibiotics for these.   Pleural effusion Chest CT showed a large loculated right pleural effusion concerning for empyema, subcentimeter groundglass nodularity within the bilateral lungs concerning for septic emboli.  S/P chest tube placement,now removed.   Abscess in epidural space of cervical spine Discitis/osteomyelitis at C2-3, C5-6, and C6-7. Ventral epidural abscess at C2-3, C5, and C6 with cord impingement especially at the lower 2 levels. Facet arthritis on the left at C7-T1 and right at C3-4. -Also showed discitis/osteomyelitis of T9- T10, T11-T12 -Was seen by neurosurgery, no focal deficits at this time. -Continue with pain management. -Patient able to ambulate and participate in therapy at this time.   Normocytic anemia Monitor hb.  Currently stable.  Received 2 units PRBC outside facility.    Severe protein-calorie malnutrition  Nutrition is consulted   Polysubstance abuse: She is interested in methadone/Suboxone, she needs to follow-up with opioid addiction clinic as an outpatient Counseled for cessation.  TOC following   anxiety with depression On Prozac and Xanax   Pathologic fracture of right clavicle MRI of the shoulder showed osteomyelitis, phlegmon, possible abscess.  Status post shoulder washout          Nutrition Problem: Increased nutrient needs Etiology: acute illness, post-op healing, wound healing    DVT prophylaxis:SCDs Start: 09/03/21 1605 enoxaparin (LOVENOX) injection 40 mg Start: 08/29/21 1000 Place and maintain sequential compression device Start: 08/28/21 1049     Code Status: Full Code  Family Communication: None at bed side  Patient status:Inpatient  Patient is from :Home  Anticipated discharge RC:[id number]  Estimated DC date:5/27   Consultants: ID,  orthopedics, neurosurgery  Procedures: As above Antimicrobials:  Anti-infectives (From admission, onward)    Start     Dose/Rate Route Frequency Ordered Stop   10/07/21 0100  vancomycin (VANCOREADY) IVPB 1250 mg/250 mL        1,250 mg 166.7 mL/hr over 90 Minutes Intravenous Every 12 hours 10/06/21 2358     10/06/21 0000  vancomycin (VANCOREADY) IVPB 1500 mg/300 mL  Status:  Discontinued        1,500 mg 150 mL/hr over 120 Minutes Intravenous Every 12 hours 10/05/21 1526 10/06/21 2358   10/05/21 1522  vancomycin variable dose per unstable renal function (pharmacist dosing)  Status:  Discontinued         Does not apply See admin instructions 10/05/21 1522 10/05/21 1527   10/01/21 2153  vancomycin (VANCOREADY) IVPB 1500 mg/300 mL  Status:  Discontinued        1,500 mg 150 mL/hr over 120 Minutes Intravenous Every 12 hours 10/01/21 2154 10/05/21 1522   09/30/21 1100  vancomycin (VANCOREADY) IVPB 750 mg/150 mL  Status:  Discontinued        750 mg 150 mL/hr over 60 Minutes Intravenous Every 8 hours 09/30/21 1007 10/01/21 2154   09/27/21 1200  vancomycin (VANCOCIN) IVPB 1000 mg/200 mL premix  Status:  Discontinued        1,000 mg 200 mL/hr over 60 Minutes Intravenous Every 12 hours 09/27/21 1137 09/30/21 1007   09/24/21 1145  fluconazole (DIFLUCAN) tablet 200 mg        200 mg Oral  Once 09/24/21 1059 09/24/21 1410   09/06/21 2200  vancomycin (VANCOREADY) IVPB 1500 mg/300 mL  Status:  Discontinued        1,500 mg 150 mL/hr over 120 Minutes  Intravenous Every 12 hours 09/06/21 1257 09/27/21 1137   09/03/21 1355  vancomycin (VANCOCIN) powder  Status:  Discontinued          As needed 09/03/21 1355 09/03/21 1426   09/01/21 2200  vancomycin (VANCOREADY) IVPB 1750 mg/350 mL  Status:  Discontinued        1,750 mg 175 mL/hr over 120 Minutes Intravenous Every 12 hours 09/01/21 1058 09/06/21 1257   08/30/21 1000  metroNIDAZOLE (FLAGYL) IVPB 500 mg  Status:  Discontinued        500 mg 100 mL/hr over 60  Minutes Intravenous Every 12 hours 08/30/21 0036 08/30/21 1708   08/30/21 0600  metroNIDAZOLE (FLAGYL) IVPB 500 mg  Status:  Discontinued        500 mg 100 mL/hr over 60 Minutes Intravenous On call to O.R. 08/30/21 0220 08/30/21 0229   08/30/21 0245  ciprofloxacin (CIPRO) IVPB 400 mg  Status:  Discontinued        400 mg 200 mL/hr over 60 Minutes Intravenous Every 12 hours 08/30/21 0037 08/30/21 1708   08/30/21 0045  cefTRIAXone (ROCEPHIN) 2 g in sodium chloride 0.9 % 100 mL IVPB  Status:  Discontinued        2 g 200 mL/hr over 30 Minutes Intravenous Every 24 hours 08/30/21 0036 08/30/21 0036   08/29/21 2315  metroNIDAZOLE (FLAGYL) IVPB 500 mg       Note to Pharmacy: TO MAIN OR STAT!! In surgery now   500 mg 100 mL/hr over 60 Minutes Intravenous To Surgery 08/29/21 2302 08/29/21 2319   08/29/21 2200  vancomycin (VANCOREADY) IVPB 1250 mg/250 mL  Status:  Discontinued        1,250 mg 166.7 mL/hr over 90 Minutes Intravenous Every 12 hours 08/29/21 1857 09/01/21 1058   08/29/21 2100  Vancomycin (VANCOCIN) 1,250 mg in sodium chloride 0.9 % 250 mL IVPB  Status:  Discontinued        1,250 mg 166.7 mL/hr over 90 Minutes Intravenous Every 12 hours 08/29/21 0837 08/29/21 1858   08/29/21 0930  vancomycin (VANCOCIN) 2,000 mg in sodium chloride 0.9 % 500 mL IVPB        2,000 mg 260 mL/hr over 120 Minutes Intravenous  Once 08/29/21 0837 08/29/21 1257   08/26/21 2000  cefTRIAXone (ROCEPHIN) 2 g in sodium chloride 0.9 % 100 mL IVPB  Status:  Discontinued        2 g 200 mL/hr over 30 Minutes Intravenous Every 24 hours 08/26/21 1614 08/28/21 1736   08/26/21 2000  DAPTOmycin (CUBICIN) 700 mg in sodium chloride 0.9 % IVPB  Status:  Discontinued        8 mg/kg  87.2 kg 128 mL/hr over 30 Minutes Intravenous Daily 08/26/21 1819 08/29/21 0842       Subjective: Patient seen and examined at bedside this morning.  Hemodynamically stable without any complaints today  Objective: Vitals:   10/24/21 1448  10/24/21 1939 10/25/21 0514 10/25/21 0741  BP: 113/62 119/76 102/65 103/63  Pulse: 98 100 95 87  Resp: 20 14 15 17   Temp: 98.4 F (36.9 C) 98.3 F (36.8 C) 98.3 F (36.8 C) 98.4 F (36.9 C)  TempSrc: Oral Oral Oral Oral  SpO2: 99% 99% 92% 100%  Weight:      Height:        Intake/Output Summary (Last 24 hours) at 10/25/2021 1112 Last data filed at 10/25/2021 10/27/2021 Gross per 24 hour  Intake 4480.05 ml  Output --  Net 4480.05 ml  Filed Weights   08/28/21 1059 08/29/21 1417 09/02/21 1010  Weight: 86.4 kg 86.4 kg 77.1 kg    Examination:  General exam: Overall comfortable, not in distress, dressing on the right shoulder HEENT: PERRL Respiratory system:  no wheezes or crackles  Cardiovascular system: S1 & S2 heard, RRR.  Gastrointestinal system: Abdomen is nondistended, soft and nontender. Central nervous system: Alert and oriented Extremities: No edema, no clubbing ,no cyanosis Skin: No rashes, no ulcers,no icterus       Data Reviewed: I have personally reviewed following labs and imaging studies  CBC: No results for input(s): WBC, NEUTROABS, HGB, HCT, MCV, PLT in the last 168 hours.  Basic Metabolic Panel: Recent Labs  Lab 10/20/21 0338  CREATININE 0.41*     No results found for this or any previous visit (from the past 240 hour(s)).   Radiology Studies: No results found.  Scheduled Meds:  bacitracin   Topical BID   Chlorhexidine Gluconate Cloth  6 each Topical Daily   enoxaparin (LOVENOX) injection  40 mg Subcutaneous Q24H   feeding supplement  237 mL Oral TID BM   FLUoxetine  40 mg Oral Daily   gabapentin  200 mg Oral TID AC & HS   lidocaine  1 patch Transdermal Q24H   mouth rinse  15 mL Mouth Rinse BID   multivitamin with minerals  1 tablet Oral Daily   nicotine  21 mg Transdermal Daily   pantoprazole  40 mg Oral BID   polyethylene glycol  17 g Oral Daily   senna-docusate  2 tablet Oral BID   sodium chloride flush  10-40 mL Intracatheter Q12H    Continuous Infusions:  sodium chloride 10 mL/hr at 10/23/21 2200   vancomycin 1,250 mg (10/25/21 1049)     LOS: 60 days   Burnadette Pop, MD [facility] P5/22/2023, 11:12 AM

## 2021-10-25 NOTE — Plan of Care (Signed)
  Problem: Education: Goal: Knowledge of General Education information will improve Description: Including pain rating scale, medication(s)/side effects and non-pharmacologic comfort measures Outcome: Progressing   Problem: Health Behavior/Discharge Planning: Goal: Ability to manage health-related needs will improve Outcome: Progressing   Problem: Coping: Goal: Level of anxiety will decrease Outcome: Progressing   Problem: Elimination: Goal: Will not experience complications related to bowel motility Outcome: Progressing Goal: Will not experience complications related to urinary retention Outcome: Progressing   Problem: Pain Managment: Goal: General experience of comfort will improve Outcome: Progressing   Problem: Safety: Goal: Ability to remain free from injury will improve Outcome: Progressing   Problem: Skin Integrity: Goal: Risk for impaired skin integrity will decrease Outcome: Progressing   Problem: Pain Managment: Goal: General experience of comfort will improve Outcome: Progressing

## 2021-10-26 ENCOUNTER — Telehealth: Payer: Self-pay

## 2021-10-26 MED ORDER — FENTANYL 25 MCG/HR TD PT72
1.0000 | MEDICATED_PATCH | TRANSDERMAL | Status: DC
  Administered 2021-10-26 – 2021-10-29 (×2): 1 via TRANSDERMAL
  Filled 2021-10-26 (×2): qty 1

## 2021-10-26 NOTE — Progress Notes (Signed)
Mobility Specialist Progress Note   10/26/21 1254  Mobility  Activity Ambulated independently in hallway  Level of Assistance Independent after set-up  Assistive Device None  Distance Ambulated (ft) 550 ft  Activity Response Tolerated well  $Mobility charge 1 Mobility   Pt received in bed and agreeable to mobility. C/o LBP pain that they rated 6/10 as well as stiffness during mobility  but no faults throughout. Pt BTB after session with all needs met and call bell in reach.   Holland Falling Mobility Specialist Phone Number [phone]

## 2021-10-26 NOTE — Plan of Care (Signed)
  Problem: Health Behavior/Discharge Planning: Goal: Ability to manage health-related needs will improve Outcome: Progressing   Problem: Clinical Measurements: Goal: Ability to maintain clinical measurements within normal limits will improve Outcome: Progressing Goal: Will remain free from infection Outcome: Progressing   

## 2021-10-26 NOTE — Plan of Care (Signed)

## 2021-10-26 NOTE — Telephone Encounter (Signed)
Patient scheduled with Red Team Provider 11/02/21 at 3:15 for 60 minute appt.

## 2021-10-26 NOTE — Progress Notes (Signed)
Nutrition Follow-up  DOCUMENTATION CODES:   Not applicable  INTERVENTION:  Continue Ensure Enlive po TID, each supplement provides 350 kcal and 20 grams of protein.   Continue MVI once daily.   NUTRITION DIAGNOSIS:   Increased nutrient needs related to acute illness, post-op healing, wound healing as evidenced by estimated needs; ongoing  GOAL:   Patient will meet greater than or equal to 90% of their needs; met  MONITOR:   PO intake, Supplement acceptance, Labs, Weight trends, Skin, I & O's  REASON FOR ASSESSMENT:   Consult Assessment of nutrition requirement/status  ASSESSMENT:   36 year old female with medical history of anxiety, depression, suicide attempt by drug OD, polysubstance abuse, R clavicle fx 8 months ago. She presented to the ED ongoing R shoulder pain. She had gone to another hospital 2 weeks ago and found to have R shoulder abscess which was treated with I&D and IV abx. She was dx with MRSA bacteremia and endocarditis was r/o on TEE on 3/16. She was [facility] from [facility] to [facility] due to ongoing positive blood cultures despite treatment. Once at [facility], extensive imaging showed R clavicle osteomyelitis/phlegmon/abscess, widespread cervical,thoracic,lumbar discitis/osteomyelitis/epidural abscess/cord impingement. MRI showed bilateral iliopsoas abscess, R complex pleural effusion. ID, Orthopedics, and Neurosurgery following.  Currently on daptomycin. Plan for transthoracic echo. Neurosurgery planning for lumbar laminectomy for debridement of abscess, L4-L5.  Per infectious disease patient will need a total of 8 weeks of antibiotic therapy starting from the day of surgery on 09/03/2021 with an end date on 10/29/2021.   3/25 - s/p lumbar laminectomy/debridement of abscess, L4-L5 3/27 - s/p dx laparoscopy, exploratory laparotomy, graham patch repair of gastric ulcer perforation; NGT placed to suction 3/30 - NGT removed 3/31 - s/p debridement R  clavicle and application of woundVAC 4/02 - MRI spine redemonstrated osteomyelitis at T11-12 and L5-S1 5/2 - wound VAC removed  Meal completion has been 75-100%. Pt with good appetite and tolerating his PO diet. Pt currently has Ensure ordered and has been consuming them. RD to continue with current orders to aid in caloric and protein needs. Labs and medications reviewed.   Diet Order:   Diet Order             Diet regular Room service appropriate? Yes; Fluid consistency: Thin  Diet effective now                   EDUCATION NEEDS:   Not appropriate for education at this time  Skin:  Skin Assessment: Reviewed RN Assessment Skin Integrity Issues:: Wound VAC Wound Vac: N/A Incisions: throat, back, abdomen  Last BM:  5/23  Height:   Ht Readings from Last 1 Encounters:  09/02/21 5' 7"  (1.702 m)    Weight:   Wt Readings from Last 1 Encounters:  09/02/21 77.1 kg   BMI:  Body mass index is 26.63 kg/m.  Estimated Nutritional Needs:   Kcal:  2200-2400  Protein:  110-125 grams  Fluid:  >/= 2.2 L/day  Corrin Parker, MS, RD, LDN RD pager number/after hours weekend pager number on Amion.

## 2021-10-26 NOTE — Telephone Encounter (Signed)
Marylene Land nurse case manager called on behalf of patient, patient is interested in [employer]

## 2021-10-26 NOTE — Telephone Encounter (Signed)
Fine to schedule for OUD.

## 2021-10-26 NOTE — Telephone Encounter (Signed)
OUD Intake:  Drug of choice: "Pain pills and heroin"  How long: "On and off for 15 years; clean for a year in the past."  Other substances: "Weed, but not really"  Currently on suboxone: No  Interested in treatment: Yes  Around users.: "I was, not now"  Social support: "Mother, brother, and friend" (lives with friend)   Other: Currently admitted (Admitted 08/26/21) at [facility]. Expected d/c 10/29/21  Will forward to OUD Attending Pool for consideration of OUD appt. Kinnie Feil, BSN, RN-BC

## 2021-10-26 NOTE — Progress Notes (Signed)
PROGRESS NOTE  Kimberly Booker  [id number] DOB: 04/22/1986 DOA: 08/26/2021 PCP: Pcp, No   Brief Narrative: 36 year old with past medical history significant for anxiety, depression, suicidal attempt drug overdose, polysubstance abuse , right clavicle fracture 8 months ago, she reported having occasional pain but a week ago and  was admitted at [facility] on  08/15/2021 due to right shoulder abscess.She was treated with I&D in the emergency department followed by IV antibiotics therapy as an inpatient.  She was diagnosed with MRSA bacteremia ,endocarditis was ruled out with a TEE performed 3/16,but she continued to have positive blood cultures despite treatment with ceftriaxone and vancomycin.  Antibiotic therapy was changed to daptomycin.  She was then transferred to [facility]. Extensive imagings have been done here which showed right clavicle osteomyelitis/phlegmon/abscess, widespread cervical,thoracic,lumbar discitis/osteomyelitis/epidural abscess/cord impingement.  MRI also showed bilateral iliopsoas abscess, right complex pleural effusion.  ID, orthopedics, neurosurgery following.    Underwent lumbar laminectomy / debridement of abscess, L4-L5 on 3/25.  Hospital course also remarkable for finding of right-sided empyema, gastric perforation.  Status post emergent laparotomy and perforation of repair by general surgery.  PCCM also following for chest tube on the right side,now removed.Underwent aspiration of the psoas abscesses on 3/30.  Underwent debridement of the right clavicle and application of wound VAC to the right shoulder area on 3/31.Developed severe lower back pain on 4/2.  MRI was done which showed improvement in the abscess.  Symptoms have improved, she is working with PT.  Now waiting to finish antibiotic course which will be completed on 5/26.  Assessment & Plan:  Principal Problem:   MRSA bacteremia Active Problems:   Abscess in epidural space of lumbar spine    Abscess in epidural space of cervical spine   Gastric perforation (HCC)   Iliopsoas abscess (HCC)   Pleural effusion   Normocytic anemia   Anxiety with depression   Pathologic fracture of right clavicle   Acute osteomyelitis of clavicle, right (HCC)   Polysubstance abuse (HCC)   Transaminitis   Pain management   MRSA bacteremia -Currently on vancomycin, end date 10/29/2021.  Has a PICC line.  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 Bilateral facet arthritis at L5-S1. Dorsal epidural abscess at L4 and L5,highly compressive on the thecal sac. -S/P  lumbar laminectomy, debridement of abscess, L4-L5 on 3/25. -No further interventions planned by neurosurgery.   -On 4/2 patient was complaining of severe pain in the lower back radiating down to her legs.  MRI lumbar redemonstrated the osteomyelitis at T11-12 and L5-S1.  Noted to be status post L4-L5 laminectomy and debridement with significant decrease in the amount of the abscess.  A small collection was noted posterior to the S1 causing moderate thecal sac narrowing.  Other findings of iliopsoas abscess and sacroiliac septic arthritis were noted as seen previously on MRI pelvis. These findings were discussed with neurosurgery.  Patient does not have any focal neurological deficits.  No intervention was recommended.  Patient able to ambulate and no focal deficits seen. -Continue with therapy and encourage ambulation as tolerated. Pt refusing lovenox.  -Wound care consulted for dressing change.  - Physical exam reassuring at this time.   Gastric perforation  Status post surgical repair, currently on soft diet, tolerating.  General surgery signed off   Iliopsoas abscess  Pelvic MRI showed bilateral infectious sacroiliitis and osteomyelitis of the sacrum and periarticular right iliac bone. Bilateral psoas  abscesses ,Small bilateral  paraspinal muscle abscesses,intramuscular abscess within the left piriformis muscle ,small intramuscular abscess within the left gluteus medius muscle. The plan is to continue antibiotics for these.   Pleural effusion Chest CT showed a large loculated right pleural effusion concerning for empyema, subcentimeter groundglass nodularity within the bilateral lungs concerning for septic emboli.  S/P chest tube placement,now removed.   Abscess in epidural space of cervical spine Discitis/osteomyelitis at C2-3, C5-6, and C6-7. Ventral epidural abscess at C2-3, C5, and C6 with cord impingement especially at the lower 2 levels. Facet arthritis on the left at C7-T1 and right at C3-4. -Also showed discitis/osteomyelitis of T9- T10, T11-T12 -Was seen by neurosurgery, no focal deficits at this time. -Continue with pain management. -Patient able to ambulate and participate in therapy at this time.   Normocytic anemia Monitor hb.  Currently stable.  Received 2 units PRBC outside facility.    Severe protein-calorie malnutrition  Nutrition is consulted   Polysubstance abuse: She is interested in methadone/Suboxone, she needs to follow-up with opioid addiction clinic as an outpatient Counseled for cessation.  TOC following   anxiety with depression On Prozac and Xanax   Pathologic fracture of right clavicle MRI of the shoulder showed osteomyelitis, phlegmon, possible abscess.  Status post shoulder washout          Nutrition Problem: Increased nutrient needs Etiology: acute illness, post-op healing, wound healing    DVT prophylaxis:SCDs Start: 09/03/21 1605 enoxaparin (LOVENOX) injection 40 mg Start: 08/29/21 1000 Place and maintain sequential compression device Start: 08/28/21 1049     Code Status: Full Code  Family Communication: None at bed side  Patient status:Inpatient  Patient is from :Home  Anticipated discharge RC:[id number]  Estimated DC date:5/27   Consultants: ID,  orthopedics, neurosurgery  Procedures: As above Antimicrobials:  Anti-infectives (From admission, onward)    Start     Dose/Rate Route Frequency Ordered Stop   10/07/21 0100  vancomycin (VANCOREADY) IVPB 1250 mg/250 mL        1,250 mg 166.7 mL/hr over 90 Minutes Intravenous Every 12 hours 10/06/21 2358     10/06/21 0000  vancomycin (VANCOREADY) IVPB 1500 mg/300 mL  Status:  Discontinued        1,500 mg 150 mL/hr over 120 Minutes Intravenous Every 12 hours 10/05/21 1526 10/06/21 2358   10/05/21 1522  vancomycin variable dose per unstable renal function (pharmacist dosing)  Status:  Discontinued         Does not apply See admin instructions 10/05/21 1522 10/05/21 1527   10/01/21 2153  vancomycin (VANCOREADY) IVPB 1500 mg/300 mL  Status:  Discontinued        1,500 mg 150 mL/hr over 120 Minutes Intravenous Every 12 hours 10/01/21 2154 10/05/21 1522   09/30/21 1100  vancomycin (VANCOREADY) IVPB 750 mg/150 mL  Status:  Discontinued        750 mg 150 mL/hr over 60 Minutes Intravenous Every 8 hours 09/30/21 1007 10/01/21 2154   09/27/21 1200  vancomycin (VANCOCIN) IVPB 1000 mg/200 mL premix  Status:  Discontinued        1,000 mg 200 mL/hr over 60 Minutes Intravenous Every 12 hours 09/27/21 1137 09/30/21 1007   09/24/21 1145  fluconazole (DIFLUCAN) tablet 200 mg        200 mg Oral  Once 09/24/21 1059 09/24/21 1410   09/06/21 2200  vancomycin (VANCOREADY) IVPB 1500 mg/300 mL  Status:  Discontinued        1,500 mg 150 mL/hr over 120 Minutes  Intravenous Every 12 hours 09/06/21 1257 09/27/21 1137   09/03/21 1355  vancomycin (VANCOCIN) powder  Status:  Discontinued          As needed 09/03/21 1355 09/03/21 1426   09/01/21 2200  vancomycin (VANCOREADY) IVPB 1750 mg/350 mL  Status:  Discontinued        1,750 mg 175 mL/hr over 120 Minutes Intravenous Every 12 hours 09/01/21 1058 09/06/21 1257   08/30/21 1000  metroNIDAZOLE (FLAGYL) IVPB 500 mg  Status:  Discontinued        500 mg 100 mL/hr over 60  Minutes Intravenous Every 12 hours 08/30/21 0036 08/30/21 1708   08/30/21 0600  metroNIDAZOLE (FLAGYL) IVPB 500 mg  Status:  Discontinued        500 mg 100 mL/hr over 60 Minutes Intravenous On call to O.R. 08/30/21 0220 08/30/21 0229   08/30/21 0245  ciprofloxacin (CIPRO) IVPB 400 mg  Status:  Discontinued        400 mg 200 mL/hr over 60 Minutes Intravenous Every 12 hours 08/30/21 0037 08/30/21 1708   08/30/21 0045  cefTRIAXone (ROCEPHIN) 2 g in sodium chloride 0.9 % 100 mL IVPB  Status:  Discontinued        2 g 200 mL/hr over 30 Minutes Intravenous Every 24 hours 08/30/21 0036 08/30/21 0036   08/29/21 2315  metroNIDAZOLE (FLAGYL) IVPB 500 mg       Note to Pharmacy: TO MAIN OR STAT!! In surgery now   500 mg 100 mL/hr over 60 Minutes Intravenous To Surgery 08/29/21 2302 08/29/21 2319   08/29/21 2200  vancomycin (VANCOREADY) IVPB 1250 mg/250 mL  Status:  Discontinued        1,250 mg 166.7 mL/hr over 90 Minutes Intravenous Every 12 hours 08/29/21 1857 09/01/21 1058   08/29/21 2100  Vancomycin (VANCOCIN) 1,250 mg in sodium chloride 0.9 % 250 mL IVPB  Status:  Discontinued        1,250 mg 166.7 mL/hr over 90 Minutes Intravenous Every 12 hours 08/29/21 0837 08/29/21 1858   08/29/21 0930  vancomycin (VANCOCIN) 2,000 mg in sodium chloride 0.9 % 500 mL IVPB        2,000 mg 260 mL/hr over 120 Minutes Intravenous  Once 08/29/21 0837 08/29/21 1257   08/26/21 2000  cefTRIAXone (ROCEPHIN) 2 g in sodium chloride 0.9 % 100 mL IVPB  Status:  Discontinued        2 g 200 mL/hr over 30 Minutes Intravenous Every 24 hours 08/26/21 1614 08/28/21 1736   08/26/21 2000  DAPTOmycin (CUBICIN) 700 mg in sodium chloride 0.9 % IVPB  Status:  Discontinued        8 mg/kg  87.2 kg 128 mL/hr over 30 Minutes Intravenous Daily 08/26/21 1819 08/29/21 0842       Subjective: Patient seen and examined at the bedside this morning.  Complains of back pain.  Fentanyl patches reordered  Objective: Vitals:   10/25/21 0741  10/25/21 1926 10/26/21 0500 10/26/21 0850  BP: 103/63 93/61 (!) 103/56 (!) 93/56  Pulse: 87 84 89 71  Resp: 17 18 16 19   Temp: 98.4 F (36.9 C) 98.3 F (36.8 C) 97.7 F (36.5 C) 98 F (36.7 C)  TempSrc: Oral Oral Oral Oral  SpO2: 100% 98% 96% 98%  Weight:      Height:       No intake or output data in the 24 hours ending 10/26/21 1310   Filed Weights   08/28/21 1059 08/29/21 1417 09/02/21 1010  Weight: 86.4  kg 86.4 kg 77.1 kg    Examination:  General exam:  not in distress,dressing on right shoulder HEENT: PERRL Respiratory system:  no wheezes or crackles  Cardiovascular system: S1 & S2 heard, RRR.  Gastrointestinal system: Abdomen is nondistended, soft and nontender. Central nervous system: Alert and oriented Extremities: No edema, no clubbing ,no cyanosis Skin: No rashes, no ulcers,no icterus     Data Reviewed: I have personally reviewed following labs and imaging studies  CBC: No results for input(s): WBC, NEUTROABS, HGB, HCT, MCV, PLT in the last 168 hours.  Basic Metabolic Panel: Recent Labs  Lab 10/20/21 0338  CREATININE 0.41*     No results found for this or any previous visit (from the past 240 hour(s)).   Radiology Studies: No results found.  Scheduled Meds:  bacitracin   Topical BID   Chlorhexidine Gluconate Cloth  6 each Topical Daily   enoxaparin (LOVENOX) injection  40 mg Subcutaneous Q24H   feeding supplement  237 mL Oral TID BM   fentaNYL  1 patch Transdermal Q72H   FLUoxetine  40 mg Oral Daily   gabapentin  200 mg Oral TID AC & HS   lidocaine  1 patch Transdermal Q24H   mouth rinse  15 mL Mouth Rinse BID   multivitamin with minerals  1 tablet Oral Daily   nicotine  21 mg Transdermal Daily   pantoprazole  40 mg Oral BID   polyethylene glycol  17 g Oral Daily   senna-docusate  2 tablet Oral BID   sodium chloride flush  10-40 mL Intracatheter Q12H   Continuous Infusions:  sodium chloride 10 mL/hr at 10/23/21 2200   vancomycin 1,250  mg (10/26/21 0533)     LOS: 61 days   Shelly Coss, MD [facility] P5/23/2023, 1:10 PM

## 2021-10-26 NOTE — Plan of Care (Signed)

## 2021-10-27 LAB — CREATININE, SERUM
Creatinine, Ser: 0.46 mg/dL (ref 0.44–1.00)
GFR, Estimated: >60 mL/min (ref >60)

## 2021-10-27 LAB — CBC
HCT: 27.6 % — ABNORMAL LOW (ref 36.0–46.0)
Hemoglobin: 8.8 g/dL — ABNORMAL LOW (ref 12.0–15.0)
MCH: 26.9 pg (ref 26.0–34.0)
MCHC: 31.9 g/dL (ref 30.0–36.0)
MCV: 84.4 fL (ref 80.0–100.0)
Platelets: 361 10*3/uL (ref 150–400)
RBC: 3.27 MIL/uL — ABNORMAL LOW (ref 3.87–5.11)
RDW: 14.4 % (ref 11.5–15.5)
WBC: 7.7 10*3/uL (ref 4.0–10.5)
nRBC: 0 % (ref 0.0–0.2)

## 2021-10-27 NOTE — Plan of Care (Addendum)
Pt is requesting to leave 10/28/21 due to daughter graduating. Will notify Adhikari MD Problem: Education: Goal: Knowledge of General Education information will improve Description: Including pain rating scale, medication(s)/side effects and non-pharmacologic comfort measures Outcome: Progressing   Problem: Clinical Measurements: Goal: Ability to maintain clinical measurements within normal limits will improve Outcome: Progressing   Problem: Activity: Goal: Risk for activity intolerance will decrease Outcome: Progressing   Problem: Nutrition: Goal: Adequate nutrition will be maintained Outcome: Progressing   Problem: Coping: Goal: Level of anxiety will decrease Outcome: Progressing

## 2021-10-27 NOTE — Progress Notes (Signed)
Pharmacy Antibiotic Note  Kimberly Booker is a 36 y.o. female admitted on 08/26/2021 with disseminated MRSA bacteremia (diffuse discitis/osteomyelitis/epidural abscess of cervical, thoracic and lumbar spine, bilateral septic arthritis, osteomyelitis of R clavicle, R lung empyema).    Patient was initially started on vancomycin 3/13-3/15 along with ceftriaxone 3/13-3/24. Vancomycin was transitioned to daptomycin and given 3/16-3/25. Pt was transferred to [facility] on 08/26/21. ID has consulted pharmacy dose vancomycin for coverage of MRSA R lung empyema.    Patient is s/p placement of IR drains into iliopsoas abscesses 3/30 and debridement of right clavicle 3/31.  Renal function remains stable. Stop date is 5/26  Tentative end date: 10/29/21 per ID recs (8 wks)  Plan: Continue Vancomycin to 1250 mg IV every 12 hours Monitor renal function No more levels as ending in 2 days, renal function stable  Height: 5\' 7"  (170.2 cm) Weight: 77.1 kg (170 lb) IBW/kg (Calculated) : 61.6  Temp (24hrs), Avg:98 F (36.7 C), Min:97.7 F (36.5 C), Max:98.3 F (36.8 C)  Recent Labs  Lab 10/27/21 0431  WBC 7.7  CREATININE 0.46     Estimated Creatinine Clearance: 104.1 mL/min (by C-G formula based on SCr of 0.46 mg/dL).    Allergies  Allergen Reactions   Amoxicillin Swelling    Throat closes    Zury Fazzino A. Levada Dy, PharmD, BCPS, FNKF Clinical Pharmacist [place] Please utilize Amion for appropriate phone number to reach the unit pharmacist ([place])  10/27/2021 10:11 AM

## 2021-10-27 NOTE — TOC Progression Note (Signed)
Transition of Care [facility]) - Progression Note    Patient Details  Name: Kimberly Booker MRN: [id number] Date of Birth: [birth year]  [facility] CM/SW Contact  Joanne Chars, LCSW Phone Number: 10/27/2021, 10:44 AM  Clinical Narrative:   CSW spoke with pt regarding DC in two days.  Pt reports that she is not planning to pursue admission to any [facility].  She has spoken with her mother in [place] and her mother is willing to have her return home.  Her mother is aware of DC on Friday and planning to come pick her up.  Permission given to speak with her mother, Chanda Busing, [phone].    CSW attempted to call [facility],  no answer, LM.    Expected Discharge Plan: Home/Self Care Barriers to Discharge: Continued Medical Work up  Expected Discharge Plan and Services Expected Discharge Plan: Home/Self Care In-house Referral: Clinical Social Work, PCP / Curator Services: CM Consult   Living arrangements for the past 2 months: Single Family Home                                       Social Determinants of Health (SDOH) Interventions    Readmission Risk Interventions     View : No data to display.

## 2021-10-27 NOTE — Progress Notes (Signed)
PROGRESS NOTE  Kimberly PotashDiana G Booker  YQM:[id number] DOB: Oct 07, 1985 DOA: 08/26/2021 PCP: Pcp, No   Brief Narrative: 36 year old with past medical history significant for anxiety, depression, suicidal attempt drug overdose, polysubstance abuse , right clavicle fracture 8 months ago, she reported having occasional pain but a week ago and  was [facility]  08/15/2021 due to right shoulder abscess.She was treated with I&D in the emergency department followed by IV antibiotics therapy as an inpatient.  She was diagnosed with MRSA bacteremia ,endocarditis was ruled out with a TEE performed 3/16,but she continued to have positive blood cultures despite treatment with ceftriaxone and vancomycin.  Antibiotic therapy was changed to daptomycin.  She was then [facility]. Extensive imagings have been done here which showed right clavicle osteomyelitis/phlegmon/abscess, widespread cervical,thoracic,lumbar discitis/osteomyelitis/epidural abscess/cord impingement.  MRI also showed bilateral iliopsoas abscess, right complex pleural effusion.  ID, orthopedics, neurosurgery following.    Underwent lumbar laminectomy / debridement of abscess, L4-L5 on 3/25.  Hospital course also remarkable for finding of right-sided empyema, gastric perforation.  Status post emergent laparotomy and perforation of repair by general surgery.  PCCM also following for chest tube on the right side,now removed.Underwent aspiration of the psoas abscesses on 3/30.  Underwent debridement of the right clavicle and application of wound VAC to the right shoulder area on 3/31.Developed severe lower back pain on 4/2.  MRI was done which showed improvement in the abscess.  Symptoms have improved, she is working with PT.  Now waiting to finish antibiotic course which will be completed on 5/26.She will be discharged home after that, she will follow-up with OUD clinic.  Assessment & Plan:  Principal Problem:   MRSA  bacteremia Active Problems:   Abscess in epidural space of lumbar spine   Abscess in epidural space of cervical spine   Gastric perforation (HCC)   Iliopsoas abscess (HCC)   Pleural effusion   Normocytic anemia   Anxiety with depression   Pathologic fracture of right clavicle   Acute osteomyelitis of clavicle, right (HCC)   Polysubstance abuse (HCC)   Transaminitis   Pain management   MRSA bacteremia -Currently on vancomycin, end date 10/29/2021.  Has a PICC line.  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 Bilateral facet arthritis at L5-S1. Dorsal epidural abscess at L4 and L5,highly compressive on the thecal sac. -S/P  lumbar laminectomy, debridement of abscess, L4-L5 on 3/25. -No further interventions planned by neurosurgery.   -On 4/2 patient was complaining of severe pain in the lower back radiating down to her legs.  MRI lumbar redemonstrated the osteomyelitis at T11-12 and L5-S1.  Noted to be status post L4-L5 laminectomy and debridement with significant decrease in the amount of the abscess.  A small collection was noted posterior to the S1 causing moderate thecal sac narrowing.  Other findings of iliopsoas abscess and sacroiliac septic arthritis were noted as seen previously on MRI pelvis. These findings were discussed with neurosurgery.  Patient does not have any focal neurological deficits.  No intervention was recommended.  Patient able to ambulate and no focal deficits seen. -Continue with therapy and encourage ambulation as tolerated. Pt refusing lovenox.  -Wound care consulted for dressing change.   Gastric perforation  Status post surgical repair, currently on soft diet, tolerating.  General surgery signed off   Iliopsoas abscess  Pelvic MRI showed bilateral infectious sacroiliitis and osteomyelitis of the sacrum and periarticular right  iliac bone. Bilateral psoas  abscesses ,Small bilateral paraspinal muscle abscesses,intramuscular abscess within the left piriformis muscle ,small intramuscular abscess within the left gluteus medius muscle. The plan is to continue antibiotics for these.   Pleural effusion Chest CT showed a large loculated right pleural effusion concerning for empyema, subcentimeter groundglass nodularity within the bilateral lungs concerning for septic emboli.  S/P chest tube placement,now removed.   Abscess in epidural space of cervical spine Discitis/osteomyelitis at C2-3, C5-6, and C6-7. Ventral epidural abscess at C2-3, C5, and C6 with cord impingement especially at the lower 2 levels. Facet arthritis on the left at C7-T1 and right at C3-4. -Also showed discitis/osteomyelitis of T9- T10, T11-T12 -Was seen by neurosurgery, no focal deficits at this time. -Continue with pain management. -Patient able to ambulate and participate in therapy at this time.   Normocytic anemia Monitor hb.  Currently stable.  Received 2 units PRBC outside facility.    Severe protein-calorie malnutrition  Nutrition is consulted   Polysubstance abuse: She is interested in methadone/Suboxone, she needs to follow-up with opioid use disorder clinic as an outpatient.  Appointment  scheduled. Counseled for cessation.  TOC following . Patient continues to complain of pain so she has been put on low oxycodone and low-dose fentanyl patches.  Might need to prescribe for a few days on discharge  anxiety with depression On Prozac and Xanax   Pathologic fracture of right clavicle MRI of the shoulder showed osteomyelitis, phlegmon, possible abscess.  Status post shoulder washout.  Dr. Lajoyce Corners instructed the patient to put the gauze and cover the wound at home.  She needs to follow-up with Dr. Lajoyce Corners in a week after discharge          Nutrition Problem: Increased nutrient needs Etiology: acute illness, post-op healing, wound healing    DVT prophylaxis:SCDs Start:  09/03/21 1605 enoxaparin (LOVENOX) injection 40 mg Start: 08/29/21 1000 Place and maintain sequential compression device Start: 08/28/21 1049     Code Status: Full Code  Family Communication: None at bed side  Patient status:Inpatient  Patient is from :Home  Anticipated discharge WU:[id number]  Estimated DC date:5/26 or 5/27 after completion of antibiotics   Consultants: ID, orthopedics, neurosurgery  Procedures: As above Antimicrobials:  Anti-infectives (From admission, onward)    Start     Dose/Rate Route Frequency Ordered Stop   10/07/21 0100  vancomycin (VANCOREADY) IVPB 1250 mg/250 mL        1,250 mg 166.7 mL/hr over 90 Minutes Intravenous Every 12 hours 10/06/21 2358 10/29/21 2359   10/06/21 0000  vancomycin (VANCOREADY) IVPB 1500 mg/300 mL  Status:  Discontinued        1,500 mg 150 mL/hr over 120 Minutes Intravenous Every 12 hours 10/05/21 1526 10/06/21 2358   10/05/21 1522  vancomycin variable dose per unstable renal function (pharmacist dosing)  Status:  Discontinued         Does not apply See admin instructions 10/05/21 1522 10/05/21 1527   10/01/21 2153  vancomycin (VANCOREADY) IVPB 1500 mg/300 mL  Status:  Discontinued        1,500 mg 150 mL/hr over 120 Minutes Intravenous Every 12 hours 10/01/21 2154 10/05/21 1522   09/30/21 1100  vancomycin (VANCOREADY) IVPB 750 mg/150 mL  Status:  Discontinued        750 mg 150 mL/hr over 60 Minutes Intravenous Every 8 hours 09/30/21 1007 10/01/21 2154   09/27/21 1200  vancomycin (VANCOCIN) IVPB 1000 mg/200 mL premix  Status:  Discontinued  1,000 mg 200 mL/hr over 60 Minutes Intravenous Every 12 hours 09/27/21 1137 09/30/21 1007   09/24/21 1145  fluconazole (DIFLUCAN) tablet 200 mg        200 mg Oral  Once 09/24/21 1059 09/24/21 1410   09/06/21 2200  vancomycin (VANCOREADY) IVPB 1500 mg/300 mL  Status:  Discontinued        1,500 mg 150 mL/hr over 120 Minutes Intravenous Every 12 hours 09/06/21 1257 09/27/21 1137    09/03/21 1355  vancomycin (VANCOCIN) powder  Status:  Discontinued          As needed 09/03/21 1355 09/03/21 1426   09/01/21 2200  vancomycin (VANCOREADY) IVPB 1750 mg/350 mL  Status:  Discontinued        1,750 mg 175 mL/hr over 120 Minutes Intravenous Every 12 hours 09/01/21 1058 09/06/21 1257   08/30/21 1000  metroNIDAZOLE (FLAGYL) IVPB 500 mg  Status:  Discontinued        500 mg 100 mL/hr over 60 Minutes Intravenous Every 12 hours 08/30/21 0036 08/30/21 1708   08/30/21 0600  metroNIDAZOLE (FLAGYL) IVPB 500 mg  Status:  Discontinued        500 mg 100 mL/hr over 60 Minutes Intravenous On call to O.R. 08/30/21 0220 08/30/21 0229   08/30/21 0245  ciprofloxacin (CIPRO) IVPB 400 mg  Status:  Discontinued        400 mg 200 mL/hr over 60 Minutes Intravenous Every 12 hours 08/30/21 0037 08/30/21 1708   08/30/21 0045  cefTRIAXone (ROCEPHIN) 2 g in sodium chloride 0.9 % 100 mL IVPB  Status:  Discontinued        2 g 200 mL/hr over 30 Minutes Intravenous Every 24 hours 08/30/21 0036 08/30/21 0036   08/29/21 2315  metroNIDAZOLE (FLAGYL) IVPB 500 mg       Note to Pharmacy: TO MAIN OR STAT!! In surgery now   500 mg 100 mL/hr over 60 Minutes Intravenous To Surgery 08/29/21 2302 08/29/21 2319   08/29/21 2200  vancomycin (VANCOREADY) IVPB 1250 mg/250 mL  Status:  Discontinued        1,250 mg 166.7 mL/hr over 90 Minutes Intravenous Every 12 hours 08/29/21 1857 09/01/21 1058   08/29/21 2100  Vancomycin (VANCOCIN) 1,250 mg in sodium chloride 0.9 % 250 mL IVPB  Status:  Discontinued        1,250 mg 166.7 mL/hr over 90 Minutes Intravenous Every 12 hours 08/29/21 0837 08/29/21 1858   08/29/21 0930  vancomycin (VANCOCIN) 2,000 mg in sodium chloride 0.9 % 500 mL IVPB        2,000 mg 260 mL/hr over 120 Minutes Intravenous  Once 08/29/21 0837 08/29/21 1257   08/26/21 2000  cefTRIAXone (ROCEPHIN) 2 g in sodium chloride 0.9 % 100 mL IVPB  Status:  Discontinued        2 g 200 mL/hr over 30 Minutes Intravenous  Every 24 hours 08/26/21 1614 08/28/21 1736   08/26/21 2000  DAPTOmycin (CUBICIN) 700 mg in sodium chloride 0.9 % IVPB  Status:  Discontinued        8 mg/kg  87.2 kg 128 mL/hr over 30 Minutes Intravenous Daily 08/26/21 1819 08/29/21 0842       Subjective: Patient seen and examined at the bedside this morning.  Hemodynamically stable.  Back pain better today after starting on fentanyl patch yesterday.  No new complaints  Objective: Vitals:   10/26/21 0500 10/26/21 0850 10/26/21 1459 10/27/21 0435  BP: (!) 103/56 (!) 93/56 122/72 (!) 100/53  Pulse: 89  71 95 79  Resp: 16 19 20 19   Temp: 97.7 F (36.5 C) 98 F (36.7 C) 98.3 F (36.8 C) 97.7 F (36.5 C)  TempSrc: Oral Oral Oral   SpO2: 96% 98% 98% 100%  Weight:      Height:        Intake/Output Summary (Last 24 hours) at 10/27/2021 1155 Last data filed at 10/27/2021 0846 Gross per 24 hour  Intake 250 ml  Output --  Net 250 ml     Filed Weights   08/28/21 1059 08/29/21 1417 09/02/21 1010  Weight: 86.4 kg 86.4 kg 77.1 kg    Examination:  General exam: Overall comfortable, not in distress HEENT: PERRL Respiratory system:  no wheezes or crackles  Cardiovascular system: S1 & S2 heard, RRR.  Gastrointestinal system: Abdomen is nondistended, soft and nontender. Central nervous system: Alert and oriented Extremities: No edema, no clubbing ,no cyanosis Skin: No rashes, no ulcers,no icterus , dressing on the right shoulder   Data Reviewed: I have personally reviewed following labs and imaging studies  CBC: Recent Labs  Lab 10/27/21 0431  WBC 7.7  HGB 8.8*  HCT 27.6*  MCV 84.4  PLT 361    Basic Metabolic Panel: Recent Labs  Lab 10/27/21 0431  CREATININE 0.46     No results found for this or any previous visit (from the past 240 hour(s)).   Radiology Studies: No results found.  Scheduled Meds:  bacitracin   Topical BID   Chlorhexidine Gluconate Cloth  6 each Topical Daily   enoxaparin (LOVENOX)  injection  40 mg Subcutaneous Q24H   feeding supplement  237 mL Oral TID BM   fentaNYL  1 patch Transdermal Q72H   FLUoxetine  40 mg Oral Daily   gabapentin  200 mg Oral TID AC & HS   lidocaine  1 patch Transdermal Q24H   mouth rinse  15 mL Mouth Rinse BID   multivitamin with minerals  1 tablet Oral Daily   nicotine  21 mg Transdermal Daily   pantoprazole  40 mg Oral BID   polyethylene glycol  17 g Oral Daily   senna-docusate  2 tablet Oral BID   sodium chloride flush  10-40 mL Intracatheter Q12H   Continuous Infusions:  sodium chloride 10 mL/hr at 10/23/21 2200   vancomycin 1,250 mg (10/27/21 0521)     LOS: 62 days   10/29/21, MD [facility] P5/24/2023, 11:55 AM

## 2021-10-28 DIAGNOSIS — B9562 Methicillin resistant Staphylococcus aureus infection as the cause of diseases classified elsewhere: Secondary | ICD-10-CM | POA: Diagnosis not present

## 2021-10-28 DIAGNOSIS — R7881 Bacteremia: Secondary | ICD-10-CM | POA: Diagnosis not present

## 2021-10-28 NOTE — TOC Progression Note (Signed)
Transition of Care [facility]) - Progression Note    Patient Details  Name: Kimberly Booker MRN: [id number] Date of Birth: 1985-12-27  [facility] CM/SW Contact  Lorri Frederick, LCSW Phone Number: 10/28/2021, 10:51 AM  Clinical Narrative:   CSW attempted to call pt mother, no answer.  CSW spoke with pt.  She reports that she spoke to her mother yesterday and her mother is planning to pick her up tomorrow.  CSW asked if she could have her mother call to confirm and she said she would tell her.      Expected Discharge Plan: Home/Self Care Barriers to Discharge: Continued Medical Work up  Expected Discharge Plan and Services Expected Discharge Plan: Home/Self Care In-house Referral: Clinical Social Work, PCP / Production designer, theatre/television/film Services: CM Consult   Living arrangements for the past 2 months: Single Family Home                                       Social Determinants of Health (SDOH) Interventions    Readmission Risk Interventions     View : No data to display.

## 2021-10-28 NOTE — Progress Notes (Signed)
PROGRESS NOTE    Kimberly Booker  ZOX:[id number] DOB: 1986/01/31 DOA: 08/26/2021 PCP: Pcp, No   Brief Narrative:   36 year old with past medical history significant for anxiety, depression, suicidal attempt drug overdose, polysubstance abuse , right clavicle fracture 8 months ago, she reported having occasional pain but a week ago and  was admitted at [facility] on  08/15/2021 due to right shoulder abscess.She was treated with I&D in the emergency department followed by IV antibiotics therapy as an inpatient.  She was diagnosed with MRSA bacteremia ,endocarditis was ruled out with a TEE performed 3/16,but she continued to have positive blood cultures despite treatment with ceftriaxone and vancomycin.  Antibiotic therapy was changed to daptomycin.  She was then transferred to [facility]. Extensive imagings have been done here which showed right clavicle osteomyelitis/phlegmon/abscess, widespread cervical,thoracic,lumbar discitis/osteomyelitis/epidural abscess/cord impingement.  MRI also showed bilateral iliopsoas abscess, right complex pleural effusion.  ID, orthopedics, neurosurgery following.    Underwent lumbar laminectomy / debridement of abscess, L4-L5 on 3/25.  Hospital course also remarkable for finding of right-sided empyema, gastric perforation.  Status post emergent laparotomy and perforation of repair by general surgery.  PCCM also following for chest tube on the right side,now removed.Underwent aspiration of the psoas abscesses on 3/30.  Underwent debridement of the right clavicle and application of wound VAC to the right shoulder area on 3/31.Developed severe lower back pain on 4/2.  MRI was done which showed improvement in the abscess.  Symptoms have improved, she is working with PT.  Now waiting to finish antibiotic course which will be completed on 5/26.She will be discharged home after that, she will follow-up with OUD clinic.   Assessment & Plan:   Principal Problem:    MRSA bacteremia Active Problems:   Abscess in epidural space of lumbar spine   Abscess in epidural space of cervical spine   Gastric perforation (HCC)   Iliopsoas abscess (HCC)   Pleural effusion   Normocytic anemia   Anxiety with depression   Pathologic fracture of right clavicle   Acute osteomyelitis of clavicle, right (HCC)   Polysubstance abuse (HCC)   Transaminitis   Pain management  Assessment and Plan:  MRSA bacteremia -Currently on vancomycin, end date 10/29/2021.  Has a PICC line.  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 Bilateral facet arthritis at L5-S1. Dorsal epidural abscess at L4 and L5,highly compressive on the thecal sac. -S/P  lumbar laminectomy, debridement of abscess, L4-L5 on 3/25. -No further interventions planned by neurosurgery.   -On 4/2 patient was complaining of severe pain in the lower back radiating down to her legs.  MRI lumbar redemonstrated the osteomyelitis at T11-12 and L5-S1.  Noted to be status post L4-L5 laminectomy and debridement with significant decrease in the amount of the abscess.  A small collection was noted posterior to the S1 causing moderate thecal sac narrowing.  Other findings of iliopsoas abscess and sacroiliac septic arthritis were noted as seen previously on MRI pelvis. These findings were discussed with neurosurgery.  Patient does not have any focal neurological deficits.  No intervention was recommended.  Patient able to ambulate and no focal deficits seen. -Continue with therapy and encourage ambulation as tolerated. Pt refusing lovenox.  -Wound care consulted for dressing change.    Gastric perforation  Status post surgical repair, currently on soft diet, tolerating.  General surgery signed off   Iliopsoas abscess  Pelvic MRI showed bilateral  infectious sacroiliitis and osteomyelitis of the sacrum and periarticular right  iliac bone. Bilateral psoas abscesses ,Small bilateral paraspinal muscle abscesses,intramuscular abscess within the left piriformis muscle ,small intramuscular abscess within the left gluteus medius muscle. The plan is to continue antibiotics for these.   Pleural effusion Chest CT showed a large loculated right pleural effusion concerning for empyema, subcentimeter groundglass nodularity within the bilateral lungs concerning for septic emboli.  S/P chest tube placement,now removed.   Abscess in epidural space of cervical spine Discitis/osteomyelitis at C2-3, C5-6, and C6-7. Ventral epidural abscess at C2-3, C5, and C6 with cord impingement especially at the lower 2 levels. Facet arthritis on the left at C7-T1 and right at C3-4. -Also showed discitis/osteomyelitis of T9- T10, T11-T12 -Was seen by neurosurgery, no focal deficits at this time. -Continue with pain management. -Patient able to ambulate and participate in therapy at this time.   Normocytic anemia Monitor hb.  Currently stable.  Received 2 units PRBC outside facility.    Severe protein-calorie malnutrition  Nutrition is consulted   Polysubstance abuse: She is interested in methadone/Suboxone, she needs to follow-up with opioid use disorder clinic as an outpatient.  Appointment  scheduled. Counseled for cessation.  TOC following . Patient continues to complain of pain so she has been put on low oxycodone and low-dose fentanyl patches.  Might need to prescribe for a few days on discharge   anxiety with depression On Prozac and Xanax   Pathologic fracture of right clavicle MRI of the shoulder showed osteomyelitis, phlegmon, possible abscess.  Status post shoulder washout.  Dr. Lajoyce Cornersuda instructed the patient to put the gauze and cover the wound at home.  She needs to follow-up with Dr. Lajoyce Cornersuda in a week after discharge   DVT prophylaxis: Lovenox Code Status: Full Family Communication: None at bedside Disposition Plan:  Status is:  Inpatient Remains inpatient appropriate because: Need for IV antibiotics through today.   Consultants:  ID Orthopedics Neurosurgery  Procedures:  See below  Antimicrobials:  Anti-infectives (From admission, onward)    Start     Dose/Rate Route Frequency Ordered Stop   10/07/21 0100  vancomycin (VANCOREADY) IVPB 1250 mg/250 mL        1,250 mg 166.7 mL/hr over 90 Minutes Intravenous Every 12 hours 10/06/21 2358 10/29/21 2359   10/06/21 0000  vancomycin (VANCOREADY) IVPB 1500 mg/300 mL  Status:  Discontinued        1,500 mg 150 mL/hr over 120 Minutes Intravenous Every 12 hours 10/05/21 1526 10/06/21 2358   10/05/21 1522  vancomycin variable dose per unstable renal function (pharmacist dosing)  Status:  Discontinued         Does not apply See admin instructions 10/05/21 1522 10/05/21 1527   10/01/21 2153  vancomycin (VANCOREADY) IVPB 1500 mg/300 mL  Status:  Discontinued        1,500 mg 150 mL/hr over 120 Minutes Intravenous Every 12 hours 10/01/21 2154 10/05/21 1522   09/30/21 1100  vancomycin (VANCOREADY) IVPB 750 mg/150 mL  Status:  Discontinued        750 mg 150 mL/hr over 60 Minutes Intravenous Every 8 hours 09/30/21 1007 10/01/21 2154   09/27/21 1200  vancomycin (VANCOCIN) IVPB 1000 mg/200 mL premix  Status:  Discontinued        1,000 mg 200 mL/hr over 60 Minutes Intravenous Every 12 hours 09/27/21 1137 09/30/21 1007   09/24/21 1145  fluconazole (DIFLUCAN) tablet 200 mg        200 mg Oral  Once  09/24/21 1059 09/24/21 1410   09/06/21 2200  vancomycin (VANCOREADY) IVPB 1500 mg/300 mL  Status:  Discontinued        1,500 mg 150 mL/hr over 120 Minutes Intravenous Every 12 hours 09/06/21 1257 09/27/21 1137   09/03/21 1355  vancomycin (VANCOCIN) powder  Status:  Discontinued          As needed 09/03/21 1355 09/03/21 1426   09/01/21 2200  vancomycin (VANCOREADY) IVPB 1750 mg/350 mL  Status:  Discontinued        1,750 mg 175 mL/hr over 120 Minutes Intravenous Every 12 hours  09/01/21 1058 09/06/21 1257   08/30/21 1000  metroNIDAZOLE (FLAGYL) IVPB 500 mg  Status:  Discontinued        500 mg 100 mL/hr over 60 Minutes Intravenous Every 12 hours 08/30/21 0036 08/30/21 1708   08/30/21 0600  metroNIDAZOLE (FLAGYL) IVPB 500 mg  Status:  Discontinued        500 mg 100 mL/hr over 60 Minutes Intravenous On call to O.R. 08/30/21 0220 08/30/21 0229   08/30/21 0245  ciprofloxacin (CIPRO) IVPB 400 mg  Status:  Discontinued        400 mg 200 mL/hr over 60 Minutes Intravenous Every 12 hours 08/30/21 0037 08/30/21 1708   08/30/21 0045  cefTRIAXone (ROCEPHIN) 2 g in sodium chloride 0.9 % 100 mL IVPB  Status:  Discontinued        2 g 200 mL/hr over 30 Minutes Intravenous Every 24 hours 08/30/21 0036 08/30/21 0036   08/29/21 2315  metroNIDAZOLE (FLAGYL) IVPB 500 mg       Note to Pharmacy: TO MAIN OR STAT!! In surgery now   500 mg 100 mL/hr over 60 Minutes Intravenous To Surgery 08/29/21 2302 08/29/21 2319   08/29/21 2200  vancomycin (VANCOREADY) IVPB 1250 mg/250 mL  Status:  Discontinued        1,250 mg 166.7 mL/hr over 90 Minutes Intravenous Every 12 hours 08/29/21 1857 09/01/21 1058   08/29/21 2100  Vancomycin (VANCOCIN) 1,250 mg in sodium chloride 0.9 % 250 mL IVPB  Status:  Discontinued        1,250 mg 166.7 mL/hr over 90 Minutes Intravenous Every 12 hours 08/29/21 0837 08/29/21 1858   08/29/21 0930  vancomycin (VANCOCIN) 2,000 mg in sodium chloride 0.9 % 500 mL IVPB        2,000 mg 260 mL/hr over 120 Minutes Intravenous  Once 08/29/21 0837 08/29/21 1257   08/26/21 2000  cefTRIAXone (ROCEPHIN) 2 g in sodium chloride 0.9 % 100 mL IVPB  Status:  Discontinued        2 g 200 mL/hr over 30 Minutes Intravenous Every 24 hours 08/26/21 1614 08/28/21 1736   08/26/21 2000  DAPTOmycin (CUBICIN) 700 mg in sodium chloride 0.9 % IVPB  Status:  Discontinued        8 mg/kg  87.2 kg 128 mL/hr over 30 Minutes Intravenous Daily 08/26/21 1819 08/29/21 0842       Subjective: Patient  seen and evaluated today with no new acute complaints or concerns. No acute concerns or events noted overnight.  Objective: Vitals:   10/27/21 1549 10/27/21 1938 10/28/21 0417 10/28/21 0854  BP: 110/60 96/60 (!) 95/56 (!) 112/59  Pulse: 72 81 84 60  Resp: 16 17 18 17   Temp: 98 F (36.7 C) 98.1 F (36.7 C) 98.1 F (36.7 C) 98.7 F (37.1 C)  TempSrc: Oral   Oral  SpO2: 98% 100% 97% 96%  Weight:  Height:        Intake/Output Summary (Last 24 hours) at 10/28/2021 0930 Last data filed at 10/27/2021 1700 Gross per 24 hour  Intake 480 ml  Output --  Net 480 ml   Filed Weights   08/28/21 1059 08/29/21 1417 09/02/21 1010  Weight: 86.4 kg 86.4 kg 77.1 kg    Examination:  General exam: Appears calm and comfortable  Respiratory system: Clear to auscultation. Respiratory effort normal. Cardiovascular system: S1 & S2 heard, RRR.  Gastrointestinal system: Abdomen is soft Central nervous system: Alert and awake Extremities: No edema Skin: No significant lesions noted Psychiatry: Flat affect.    Data Reviewed: I have personally reviewed following labs and imaging studies  CBC: Recent Labs  Lab 10/27/21 0431  WBC 7.7  HGB 8.8*  HCT 27.6*  MCV 84.4  PLT 361   Basic Metabolic Panel: Recent Labs  Lab 10/27/21 0431  CREATININE 0.46   GFR: Estimated Creatinine Clearance: 104.1 mL/min (by C-G formula based on SCr of 0.46 mg/dL). Liver Function Tests: No results for input(s): AST, ALT, ALKPHOS, BILITOT, PROT, ALBUMIN in the last 168 hours. No results for input(s): LIPASE, AMYLASE in the last 168 hours. No results for input(s): AMMONIA in the last 168 hours. Coagulation Profile: No results for input(s): INR, PROTIME in the last 168 hours. Cardiac Enzymes: No results for input(s): CKTOTAL, CKMB, CKMBINDEX, TROPONINI in the last 168 hours. BNP (last 3 results) No results for input(s): PROBNP in the last 8760 hours. HbA1C: No results for input(s): HGBA1C in the last 72  hours. CBG: No results for input(s): GLUCAP in the last 168 hours. Lipid Profile: No results for input(s): CHOL, HDL, LDLCALC, TRIG, CHOLHDL, LDLDIRECT in the last 72 hours. Thyroid Function Tests: No results for input(s): TSH, T4TOTAL, FREET4, T3FREE, THYROIDAB in the last 72 hours. Anemia Panel: No results for input(s): VITAMINB12, FOLATE, FERRITIN, TIBC, IRON, RETICCTPCT in the last 72 hours. Sepsis Labs: No results for input(s): PROCALCITON, LATICACIDVEN in the last 168 hours.  No results found for this or any previous visit (from the past 240 hour(s)).       Radiology Studies: No results found.      Scheduled Meds:  bacitracin   Topical BID   Chlorhexidine Gluconate Cloth  6 each Topical Daily   enoxaparin (LOVENOX) injection  40 mg Subcutaneous Q24H   feeding supplement  237 mL Oral TID BM   fentaNYL  1 patch Transdermal Q72H   FLUoxetine  40 mg Oral Daily   gabapentin  200 mg Oral TID AC & HS   lidocaine  1 patch Transdermal Q24H   mouth rinse  15 mL Mouth Rinse BID   multivitamin with minerals  1 tablet Oral Daily   nicotine  21 mg Transdermal Daily   pantoprazole  40 mg Oral BID   polyethylene glycol  17 g Oral Daily   senna-docusate  2 tablet Oral BID   sodium chloride flush  10-40 mL Intracatheter Q12H   Continuous Infusions:  sodium chloride 10 mL/hr at 10/23/21 2200   vancomycin 1,250 mg (10/28/21 0424)     LOS: 63 days    Time spent: 35 minutes    Kimberly Ringler Hoover Brunette, DO [facility]  If 7PM-7AM, please contact night-coverage [email or web address] 10/28/2021, 9:30 AM

## 2021-10-29 DIAGNOSIS — B9562 Methicillin resistant Staphylococcus aureus infection as the cause of diseases classified elsewhere: Secondary | ICD-10-CM | POA: Diagnosis not present

## 2021-10-29 DIAGNOSIS — R7881 Bacteremia: Secondary | ICD-10-CM | POA: Diagnosis not present

## 2021-10-29 MED ORDER — BACITRACIN ZINC 500 UNIT/GM EX OINT: TOPICAL_OINTMENT | Freq: Two times a day (BID) | CUTANEOUS | 0 refills | Status: AC

## 2021-10-29 MED ORDER — SENNOSIDES-DOCUSATE SODIUM 8.6-50 MG PO TABS: 2.0000 | ORAL_TABLET | Freq: Two times a day (BID) | ORAL | 0 refills | Status: AC

## 2021-10-29 MED ORDER — LIDOCAINE 5 % EX PTCH: 1.0000 | MEDICATED_PATCH | CUTANEOUS | 0 refills | Status: AC

## 2021-10-29 MED ORDER — BISACODYL 10 MG RE SUPP
10.0000 mg | Freq: Every day | RECTAL | 0 refills | Status: AC | PRN
Start: 2021-10-29 — End: ?

## 2021-10-29 MED ORDER — OXYCODONE-ACETAMINOPHEN 7.5-325 MG PO TABS: 1.0000 | ORAL_TABLET | Freq: Four times a day (QID) | ORAL | 0 refills | Status: DC | PRN

## 2021-10-29 MED ORDER — OXYCODONE-ACETAMINOPHEN 7.5-325 MG PO TABS
1.0000 | ORAL_TABLET | Freq: Four times a day (QID) | ORAL | 0 refills | Status: AC | PRN
Start: 2021-10-29 — End: ?

## 2021-10-29 MED ORDER — FENTANYL 25 MCG/HR TD PT72: 1.0000 | MEDICATED_PATCH | TRANSDERMAL | 0 refills | Status: DC

## 2021-10-29 MED ORDER — GABAPENTIN 100 MG PO CAPS: 200.0000 mg | ORAL_CAPSULE | Freq: Three times a day (TID) | ORAL | 0 refills | Status: DC

## 2021-10-29 MED ORDER — ALPRAZOLAM 0.5 MG PO TABS: 0.5000 mg | ORAL_TABLET | Freq: Two times a day (BID) | ORAL | 0 refills | Status: DC | PRN

## 2021-10-29 MED ORDER — POLYETHYLENE GLYCOL 3350 17 G PO PACK: 17.0000 g | PACK | Freq: Every day | ORAL | 0 refills | Status: AC

## 2021-10-29 MED ORDER — CYCLOBENZAPRINE HCL 5 MG PO TABS
5.0000 mg | ORAL_TABLET | Freq: Two times a day (BID) | ORAL | 0 refills | Status: AC | PRN
Start: 2021-10-29 — End: ?

## 2021-10-29 MED ORDER — PANTOPRAZOLE SODIUM 40 MG PO TBEC: 40.0000 mg | DELAYED_RELEASE_TABLET | Freq: Two times a day (BID) | ORAL | 0 refills | Status: AC

## 2021-10-29 MED ORDER — FENTANYL 25 MCG/HR TD PT72: 1.0000 | MEDICATED_PATCH | TRANSDERMAL | 0 refills | Status: AC

## 2021-10-29 MED ORDER — ALPRAZOLAM 0.5 MG PO TABS: 0.5000 mg | ORAL_TABLET | Freq: Two times a day (BID) | ORAL | 0 refills | Status: AC | PRN

## 2021-10-29 MED ORDER — ENSURE ENLIVE PO LIQD: 237.0000 mL | Freq: Three times a day (TID) | ORAL | 12 refills | Status: AC

## 2021-10-29 MED ORDER — GABAPENTIN 100 MG PO CAPS: 200.0000 mg | ORAL_CAPSULE | Freq: Three times a day (TID) | ORAL | 0 refills | Status: AC

## 2021-10-29 NOTE — Discharge Summary (Signed)
Physician Discharge Summary  Kimberly Booker TKW:[id number] DOB: 1985-10-02 DOA: 08/26/2021  PCP: Oneita Hurt, No  Admit date: 08/26/2021  Discharge date: 10/29/2021  Admitted From:Home  Disposition:  Home  Recommendations for Outpatient Follow-up:  Follow up with PCP in 1-2 weeks Follow-up with opioid use disorder clinic for eventual initiation of Suboxone/pain management Follow-up with Dr. Lajoyce Corners in 1 week for evaluation of right clavicle fracture Follow-up with general surgery as scheduled for follow-up regarding gastric perforation Continue pain medications and other medications as noted below Course of antibiotics has been completed and PICC line removed prior to discharge  Home Health: None  Equipment/Devices: None  Discharge Condition:Stable  CODE STATUS: Full  Diet recommendation: Soft diet  Brief/Interim Summary: 36 year old with past medical history significant for anxiety, depression, suicidal attempt drug overdose, polysubstance abuse , right clavicle fracture 8 months ago, she reported having occasional pain but a week ago and  was admitted at [facility] on  08/15/2021 due to right shoulder abscess.She was treated with I&D in the emergency department followed by IV antibiotics therapy as an inpatient.  She was diagnosed with MRSA bacteremia ,endocarditis was ruled out with a TEE performed 3/16,but she continued to have positive blood cultures despite treatment with ceftriaxone and vancomycin.  Antibiotic therapy was changed to daptomycin.  She was then transferred to [facility]. Extensive imagings have been done here which showed right clavicle osteomyelitis/phlegmon/abscess, widespread cervical,thoracic,lumbar discitis/osteomyelitis/epidural abscess/cord impingement.  MRI also showed bilateral iliopsoas abscess, right complex pleural effusion.  ID, orthopedics, neurosurgery following.    Underwent lumbar laminectomy / debridement of abscess, L4-L5 on 3/25.  Hospital course  also remarkable for finding of right-sided empyema, gastric perforation.  Status post emergent laparotomy and perforation of repair by general surgery.  PCCM also following for chest tube on the right side,now removed.Underwent aspiration of the psoas abscesses on 3/30.  Underwent debridement of the right clavicle and application of wound VAC to the right shoulder area on 3/31.Developed severe lower back pain on 4/2.  MRI was done which showed improvement in the abscess.  Symptoms have improved, she is working with PT and is noted not to have any specific needs on discharge.  She has completed her vancomycin course through 5/26 and PICC line will be discontinued.  She has been prescribed pain and anxiety medications as noted below and will need outpatient follow-up with opioid clinic.  She will be followed up with orthopedics as well as general surgery in the outpatient setting.  Discharge Diagnoses:  Principal Problem:   MRSA bacteremia Active Problems:   Abscess in epidural space of lumbar spine   Abscess in epidural space of cervical spine   Gastric perforation (HCC)   Iliopsoas abscess (HCC)   Pleural effusion   Normocytic anemia   Anxiety with depression   Pathologic fracture of right clavicle   Acute osteomyelitis of clavicle, right (HCC)   Polysubstance abuse (HCC)   Transaminitis   Pain management  Principal discharge diagnosis: MRSA bacteremia with widespread cervical, thoracic, and lumbar discitis/osteomyelitis along with iliopsoas abscess.   Gastric perforation status postrepair.   Pathologic fracture of right clavicle. Polysubstance abuse.  Discharge Instructions  Discharge Instructions     Change dressing   Complete by: As directed    Wash the right shoulder wound with soap and water in the shower daily.  Apply dry gauze dressing and tape.  Change daily.   Diet - low sodium heart healthy   Complete by: As directed  Discharge wound care:   Complete by: As directed     Daily as noted.   Increase activity slowly   Complete by: As directed       Allergies as of 10/29/2021       Reactions   Amoxicillin Swelling   Throat closes        Medication List     STOP taking these medications    ibuprofen 200 MG tablet Commonly known as: ADVIL       TAKE these medications    ALPRAZolam 0.5 MG tablet Commonly known as: XANAX Take 1 tablet (0.5 mg total) by mouth 2 (two) times daily as needed for anxiety.   bacitracin ointment Apply topically 2 (two) times daily.   bisacodyl 10 MG suppository Commonly known as: DULCOLAX Place 1 suppository (10 mg total) rectally daily as needed for moderate constipation.   cyclobenzaprine 5 MG tablet Commonly known as: FLEXERIL Take 1 tablet (5 mg total) by mouth 2 (two) times daily as needed for muscle spasms.   feeding supplement Liqd Take 237 mLs by mouth 3 (three) times daily between meals.   fentaNYL 25 MCG/HR Commonly known as: DURAGESIC Place 1 patch onto the skin every 3 (three) days.   FLUoxetine 40 MG capsule Commonly known as: PROZAC Take 40 mg by mouth daily.   gabapentin 100 MG capsule Commonly known as: NEURONTIN Take 2 capsules (200 mg total) by mouth 4 (four) times daily -  before meals and at bedtime.   lidocaine 5 % Commonly known as: LIDODERM Place 1 patch onto the skin daily. Remove & Discard patch within 12 hours or as directed by MD   oxyCODONE-acetaminophen 7.5-325 MG tablet Commonly known as: PERCOCET Take 1 tablet by mouth every 6 (six) hours as needed for moderate pain.   pantoprazole 40 MG tablet Commonly known as: PROTONIX Take 1 tablet (40 mg total) by mouth 2 (two) times daily.   polyethylene glycol 17 g packet Commonly known as: MIRALAX / GLYCOLAX Take 17 g by mouth daily.   senna-docusate 8.6-50 MG tablet Commonly known as: Senokot-S Take 2 tablets by mouth 2 (two) times daily.               Discharge Care Instructions  (From admission, onward)            Start     Ordered   10/29/21 0000  Discharge wound care:       Comments: Daily as noted.   10/29/21 0933   10/25/21 0000  Change dressing       Comments: Wash the right shoulder wound with soap and water in the shower daily.  Apply dry gauze dressing and tape.  Change daily.   10/25/21 1144            Follow-up Information     Nadara Mustarduda, Marcus V, MD Follow up.   Specialty: Orthopedic Surgery Contact information: [address] [place] [address] [phone]         Fritzi MandesAllen, Shelby L, MD Follow up.   Specialty: General Surgery Contact information: [address] [place] [address] [phone]                Allergies  Allergen Reactions   Amoxicillin Swelling    Throat closes    Consultations: ID Orthopedics General surgery Neurosurgery   Procedures/Studies: No results found.   Discharge Exam: Vitals:   10/29/21 0442 10/29/21 0839  BP: (!) 104/59 (!) 104/56  Pulse: 70  68  Resp: 19 16  Temp: 98.1 F (36.7 C) 98.3 F (36.8 C)  SpO2: (!) 85% 100%   Vitals:   10/28/21 1532 10/28/21 2008 10/29/21 0442 10/29/21 0839  BP: 101/64 (!) 104/44 (!) 104/59 (!) 104/56  Pulse: 70 68 70 68  Resp: 15 17 19 16   Temp: 98.6 F (37 C) 98 F (36.7 C) 98.1 F (36.7 C) 98.3 F (36.8 C)  TempSrc: Oral   Oral  SpO2: 98% 100% (!) 85% 100%  Weight:      Height:        General: Pt is alert, awake, not in acute distress Cardiovascular: RRR, S1/S2 +, no rubs, no gallops Respiratory: CTA bilaterally, no wheezing, no rhonchi Abdominal: Soft, NT, ND, bowel sounds + Extremities: no edema, no cyanosis    The results of significant diagnostics from this hospitalization (including imaging, microbiology, ancillary and laboratory) are listed below for reference.     Microbiology: No results found for this or any previous visit (from the past 240 hour(s)).   Labs: BNP (last 3 results) No results for input(s): BNP in the last  8760 hours. Basic Metabolic Panel: Recent Labs  Lab 10/27/21 0431  CREATININE 0.46   Liver Function Tests: No results for input(s): AST, ALT, ALKPHOS, BILITOT, PROT, ALBUMIN in the last 168 hours. No results for input(s): LIPASE, AMYLASE in the last 168 hours. No results for input(s): AMMONIA in the last 168 hours. CBC: Recent Labs  Lab 10/27/21 0431  WBC 7.7  HGB 8.8*  HCT 27.6*  MCV 84.4  PLT 361   Cardiac Enzymes: No results for input(s): CKTOTAL, CKMB, CKMBINDEX, TROPONINI in the last 168 hours. BNP: Invalid input(s): POCBNP CBG: No results for input(s): GLUCAP in the last 168 hours. D-Dimer No results for input(s): DDIMER in the last 72 hours. Hgb A1c No results for input(s): HGBA1C in the last 72 hours. Lipid Profile No results for input(s): CHOL, HDL, LDLCALC, TRIG, CHOLHDL, LDLDIRECT in the last 72 hours. Thyroid function studies No results for input(s): TSH, T4TOTAL, T3FREE, THYROIDAB in the last 72 hours.  Invalid input(s): FREET3 Anemia work up No results for input(s): VITAMINB12, FOLATE, FERRITIN, TIBC, IRON, RETICCTPCT in the last 72 hours. Urinalysis No results found for: COLORURINE, APPEARANCEUR, LABSPEC, PHURINE, GLUCOSEU, HGBUR, BILIRUBINUR, KETONESUR, PROTEINUR, UROBILINOGEN, NITRITE, LEUKOCYTESUR Sepsis Labs Invalid input(s): PROCALCITONIN,  WBC,  LACTICIDVEN Microbiology No results found for this or any previous visit (from the past 240 hour(s)).   Time coordinating discharge: 45 minutes  SIGNED:   10/29/21, DO [facility] 10/29/2021, 10:04 AM  If 7PM-7AM, please contact night-coverage [email or web address]

## 2021-10-29 NOTE — Plan of Care (Signed)
  Problem: Education: Goal: Knowledge of General Education information will improve Description: Including pain rating scale, medication(s)/side effects and non-pharmacologic comfort measures Outcome: Adequate for Discharge   

## 2021-10-29 NOTE — TOC Progression Note (Signed)
Transition of Care [facility]) - Progression Note    Patient Details  Name: ANIHYA TUMA MRN: [id number] Date of Birth: 10-25-1985  Transition of Care [facility]) CM/SW Contact  Lorri Frederick, LCSW Phone Number: 10/29/2021, 11:30 AM  Clinical Narrative:  CSW located [facility] in [place] as taking new patients, attempted to call, unable to reach anyone, info placed on AVS.  CSW spoke with pt about this and follow up substance abuse services.  Pt has suboxone appt in place in [facility] 5/30, per notes, CSW provided information on suboxone providers in [place] if needed.  Pt reports desire for suboxone but not other treatment at this time.      Expected Discharge Plan: Home/Self Care Barriers to Discharge: No Barriers Identified  Expected Discharge Plan and Services Expected Discharge Plan: Home/Self Care In-house Referral: Clinical Social Work, PCP / Management consultant Discharge Planning Services: CM Consult   Living arrangements for the past 2 months: Single Family Home Expected Discharge Date: 10/29/21                                     Social Determinants of Health (SDOH) Interventions    Readmission Risk Interventions     View : No data to display.

## 2021-10-29 NOTE — Progress Notes (Signed)
Nursing DC note  Patient alert and oriented. Verbalized understanding of dc instructions.Patients ride estimated arrival is around 2 pm. This patient is not appropriate for DC lounge due to [facility] contact precautions.

## 2021-10-29 NOTE — Progress Notes (Signed)
Mobility Specialist Progress Note    10/29/21 1021  Mobility  Activity Ambulated independently in hallway  Level of Assistance Modified independent, requires aide device or extra time  Assistive Device Other (Comment) (IV pole)  Distance Ambulated (ft) 570 ft  Activity Response Tolerated well  $Mobility charge 1 Mobility   Pt received in bed and agreeable. No complaints on walk. Returned to bed with call bell in reach.    [place] Mobility Specialist  Primary: 5N M.S. Phone: [phone] Secondary: 6N M.S. Phone: [phone]

## 2021-10-29 NOTE — TOC Transition Note (Signed)
Transition of Care [facility]) - CM/SW Discharge Note   Patient Details  Name: Kimberly Booker MRN: [id number] Date of Birth: May 20, 1986  Transition of Care [facility]) CM/SW Contact:  Epifanio Lesches, RN Phone Number: 10/29/2021, 11:08 AM   Clinical Narrative:    Patient will DC to: home  Anticipated DC date: 10/29/2021 Family notified: yes Transport by: car        MRSA bacteremia; s/p DEBRIDEMENT RIGHT CLAVICLE,3/31  Per MD patient ready for DC today . RN, patient,  and patient mom notified of DC.  Pt states will transition to mom's residence. Pt without Rx med concerns. No DME needs. Post hospital f/u appointment noted on AVS.  Pt to [facility] on 11/02/2021 @ 3:15pm regarding substance abuse issues.  Pt states without transportation issues.  Mother to provide transportation to home.  RNCM will sign off for now as intervention is no longer needed. Please consult [place] again if new needs arise.    Final next level of care: Home/Self Care Barriers to Discharge: No Barriers Identified   Patient Goals and CMS Choice Patient states their goals for this hospitalization and ongoing recovery are:: Patient plans to return home to her father's home in [place], [place] CMS Medicare.gov Compare Post Acute Care list provided to:: Patient Choice offered to / list presented to : Patient  Discharge Placement                       Discharge Plan and Services In-house Referral: Clinical Social Work, PCP / Management consultant Discharge Planning Services: CM Consult                                 Social Determinants of Health (SDOH) Interventions     Readmission Risk Interventions     View : No data to display.

## 2021-11-02 ENCOUNTER — Encounter: Payer: Medicaid Other | Admitting: Internal Medicine

## 2021-11-02 NOTE — Telephone Encounter (Signed)
Patient called in to cancel today's appt. States she has no transportation and needs to find a clinic closer to her in [place]. She received a Rx for oxycodone #10 on 5/26 from Dr. Sherryll Burger. [facility]y on b/o. She is advised to call Dr. Sherryll Burger to have Rx sent to alternate pharmacy.

## 2021-11-21 ENCOUNTER — Encounter: Payer: Self-pay | Admitting: Emergency Medicine

## 2021-11-21 ENCOUNTER — Telehealth: Payer: Medicaid Other | Admitting: Emergency Medicine

## 2021-11-21 DIAGNOSIS — M5442 Lumbago with sciatica, left side: Secondary | ICD-10-CM

## 2021-11-21 NOTE — Progress Notes (Signed)
Based on what you shared with me, I feel your condition warrants further evaluation as soon as possible at an Emergency department.    NOTE: There will be NO CHARGE for this eVisit   If you are having a true medical emergency please call 911.      Emergency Department-[facility]  Get Driving Directions  [phone]  [address]  [place], [place] [address]  Open 24/7/365      [place] [facility] [address]  Get Driving Directions  [address]  [place], [place] [address]  Open 24/7/365    Emergency Department- [facility]  Get Driving Directions  [phone]  [address]  [place], [place] [address]  Open 24/7/365      [facility]  Get Driving Directions  [phone]  [address]  [place], [place] [address]  Open 24/7/365    [place]  Emergency Department- [facility]  Get Driving Directions  [phone]  [address]  [place], [place] [address]  Open 24/7/365    [place]  Emergency Department- [facility] [place]  Get Driving Directions  [address]  [place], [place] [address]  Open 24/7/365    [place]  Emergency Department- [facility]  Get Driving Directions  [phone]  [address]  [place], [place] [address]  Open 24/7/365    

## 2022-01-06 ENCOUNTER — Encounter (HOSPITAL_COMMUNITY): Payer: Self-pay

## 2022-01-07 ENCOUNTER — Telehealth: Payer: Self-pay

## 2022-01-07 NOTE — Telephone Encounter (Signed)
Received a referral for this patient who is currently at [facility] for discitis and osteomyelitis. Per nurse patient is currently on IV vanc and ceftriaxone. Patient has history of IV drug use and [facility] is not comfortable discharging her on IV antibiotics. Patient scheduled to see you 01/10/22. I just wanted to give you and update

## 2022-01-10 ENCOUNTER — Telehealth: Payer: Self-pay

## 2022-01-10 ENCOUNTER — Ambulatory Visit: Payer: Medicaid Other | Admitting: Infectious Disease

## 2022-01-10 NOTE — Telephone Encounter (Signed)
Patient called office to reschedule appointment. States that back pain is really bothering her and does not feel like she can be seen today. Would like to know if someone would be able to prescribe pain medication.  Advised patient contact her surgeon to see if they would send pain medication. Call transferred to front desk to help reschedule appointment. Advised if back pain stays elevated to go to ED today. Juanita Laster, RMA

## 2022-01-11 ENCOUNTER — Ambulatory Visit: Payer: Medicaid Other | Admitting: Infectious Diseases

## 2022-01-18 ENCOUNTER — Ambulatory Visit: Payer: Medicaid Other | Admitting: Infectious Disease

## 2022-01-18 NOTE — Telephone Encounter (Signed)
Called patient and emergency contacts regarding appointment for today. If patient cannot make it to visit today should go to [facility] ED for evaluation. Left voicemail with patient's father requesting call back about missed visit. Juanita Laster, RMA

## 2022-01-20 ENCOUNTER — Telehealth: Payer: Self-pay

## 2022-01-20 ENCOUNTER — Ambulatory Visit: Payer: Medicaid Other | Admitting: Internal Medicine

## 2022-01-20 NOTE — Telephone Encounter (Signed)
Patient called office back regarding missed call. States that she has been having issues with mobility and is the reason she has not been able to make it to her appointment. Advised patient that she should  go to ED if back pain is staying elevated.  Verbalized understanding.  Would like to know who she can talk to about getting pain meds. Informed her that dr. Daiva Eves does not prescribe this and will need to follow up with Surgeon or go to ED.  Understands that if she misses upcoming appointment referral will be closed.  P: [phone] Updated patient number in chart. Juanita Laster, RMA

## 2022-01-20 NOTE — Telephone Encounter (Signed)
Per Dr. Daiva Eves called patient to follow up on missed visit and check on how she is feeling. Was able to connect to patient's father for alternative number to reach Kimberly Booker.  Is not able to provide number. States he will call office with number or have patient call back. Patient will need to go to ED if she is not feeling well. Will need to inform patient that since she has had 5 canceled appointments her referral will be closed out and appointment will be canceled. If patient is able to confirm she will make it to visit can leave appointment.  Kimberly Booker, RMA

## 2022-01-20 NOTE — Telephone Encounter (Signed)
Ok I will be shocked if she shows but hopefully she does

## 2022-01-25 ENCOUNTER — Ambulatory Visit: Payer: Medicaid Other | Admitting: Infectious Disease

## 2023-04-02 IMAGING — DX DG SHOULDER 2+V*R*
3 series · 3 of 3 positions shown · non-contrast
Comparison: None.

CLINICAL DATA: History of prior clavicular fracture and right
shoulder abscess, initial encounter

EXAM:
RIGHT SHOULDER - 2+ VIEW

[shoulder ap]
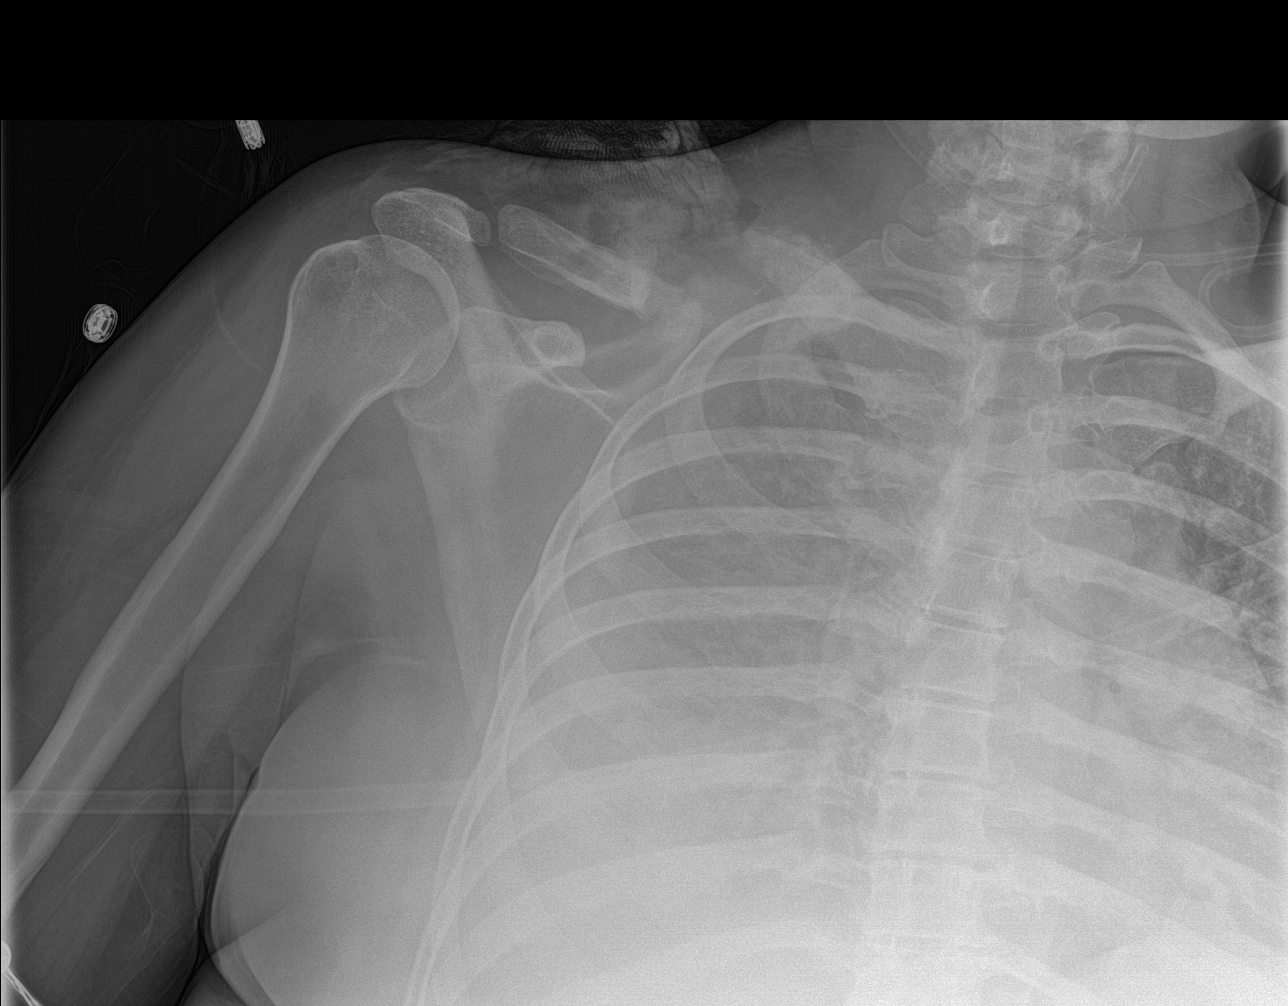

[shoulder obl (1 of 2)]
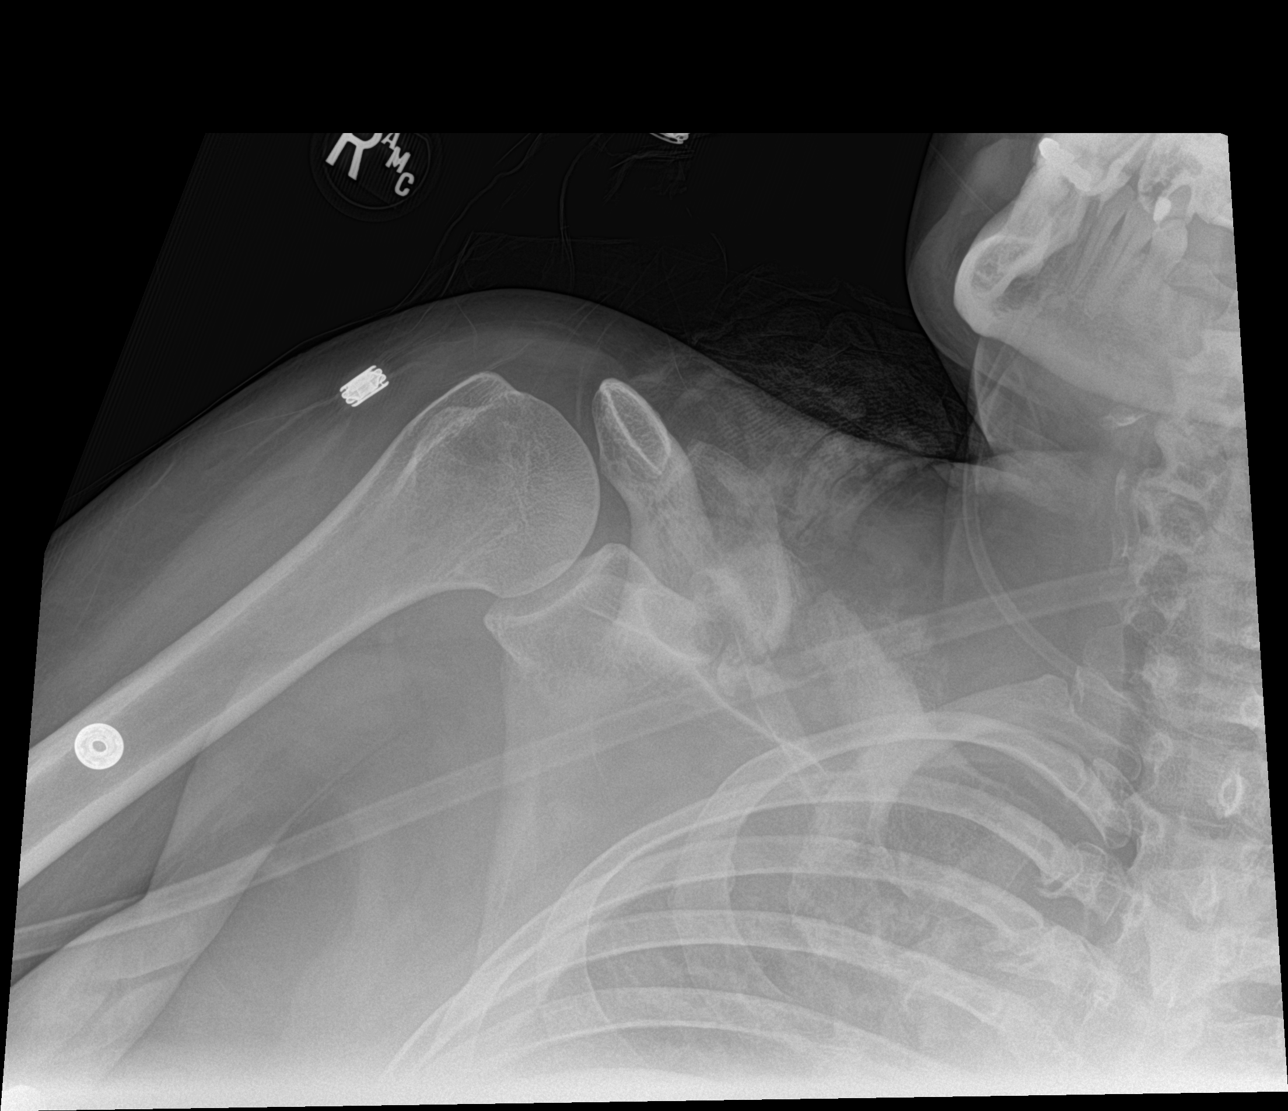

[shoulder obl (2 of 2)]
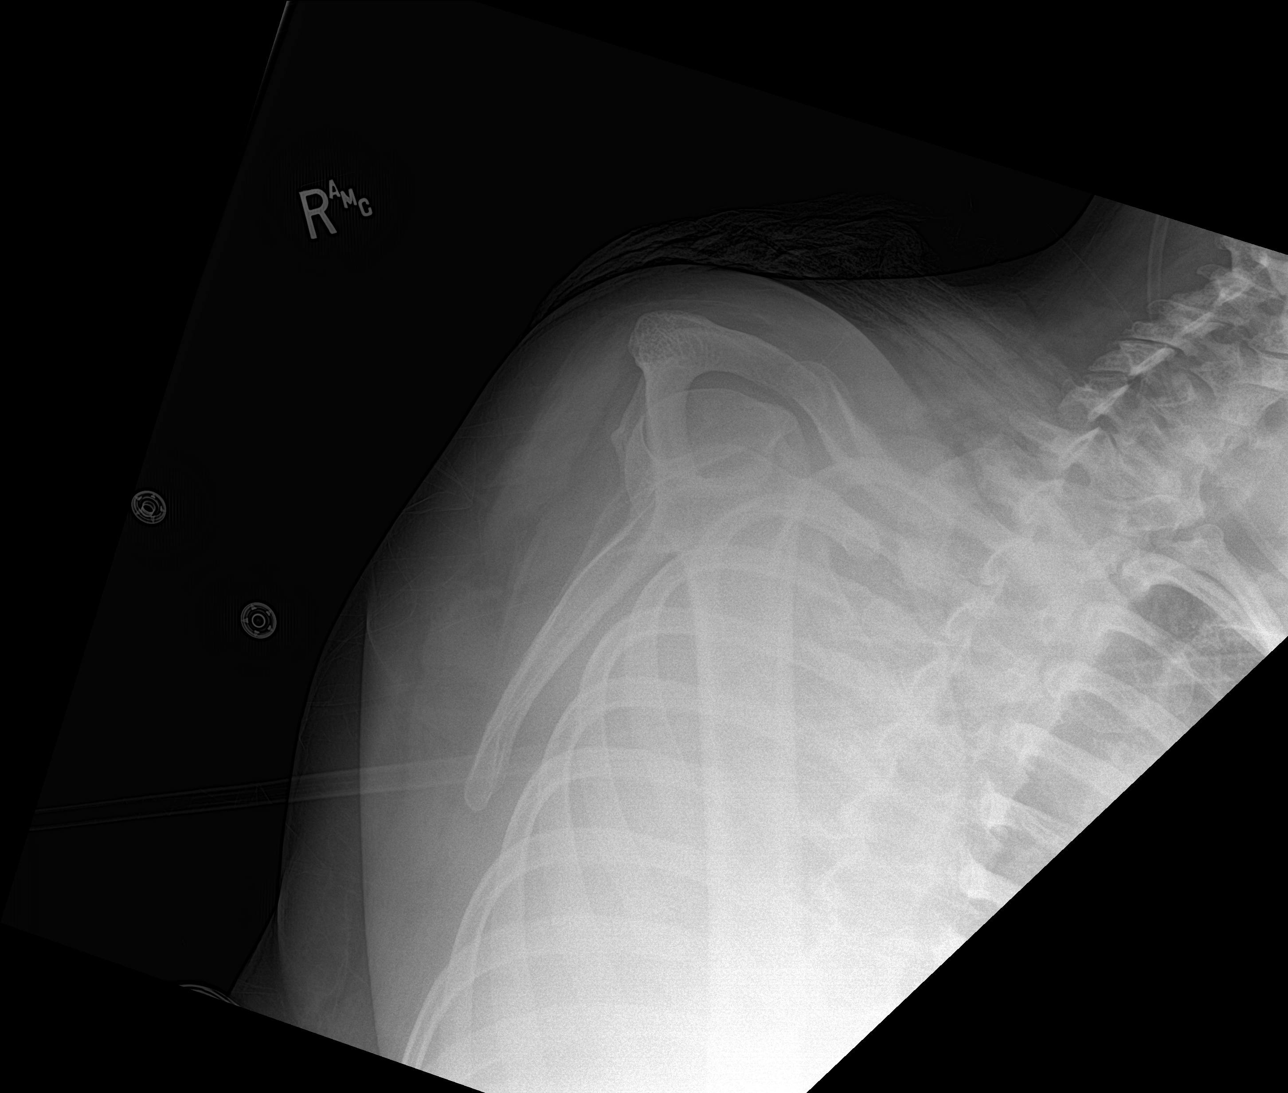

[3 of 3 positions shown; findings below may reference images not displayed]

FINDINGS: Midshaft right clavicular fracture is noted with downward
displacement of the distal fracture fragment. Increased density is
noted over the shoulder consistent with soft tissue dressing. Some
air is noted in the supraclavicular region consistent with the known
history of abscess. Humeral head is well seated. Generalized
increased density is noted in the visualized lung fields likely
related to poor inspiratory effort.
IMPRESSION: Right clavicular fracture with soft tissue changes consistent with
the given clinical history of abscess.

## 2023-04-03 IMAGING — MR MR PELVIS WO/W CM
9 of 18 series · 23 of 48 positions shown · IV contrast (9 ML GADAVIST)
Comparison: MRI lumbar spine 08/27/2021.

CLINICAL DATA: Intra-abdominal abscess

EXAM:
MRI PELVIS WITHOUT AND WITH CONTRAST
TECHNIQUE: Multiplanar multisequence MR imaging of the pelvis was performed
both before and after administration of intravenous contrast.
CONTRAST:  9mL GADAVIST GADOBUTROL 1 MMOL/ML IV SOLN

[Series 8: T2 · coronal · 5.0mm · 1.41mm/px · 1 of 41 slices shown]
[im 1/41]
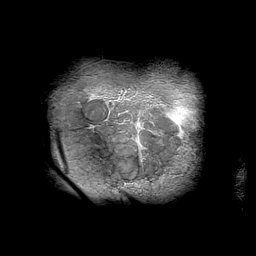

[Series 9: ax tse trig · axial · 5.5mm · 0.81mm/px · 1 of 31 slices shown (1 of 2)]
[im 1/31]
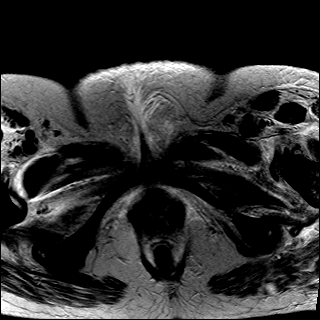

[Series 10: ax tse trig · axial · 5.5mm · 0.81mm/px · 1 of 41 slices shown (2 of 2)]
[im 1/41]
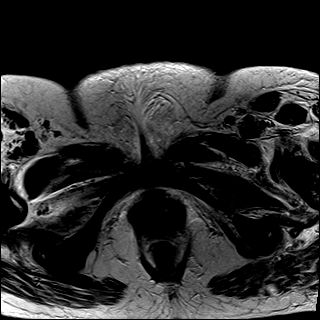

[Series 11: ax tse fs · axial · 5.5mm · 0.81mm/px · 1 of 41 slices shown]
[im 1/41]
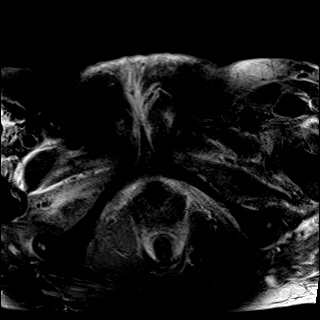

[Series 13: T1 dynamic fat-sat · axial · 1.4mm · 0.78mm/px · z∈[-109,+136]mm · 6 of 176 slices shown (1 of 2)]
[im 1/176]
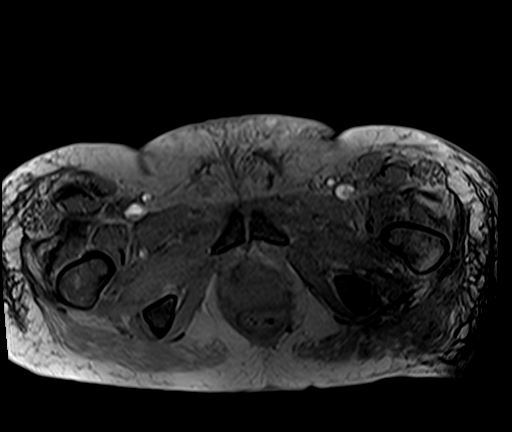
[im 36/176]
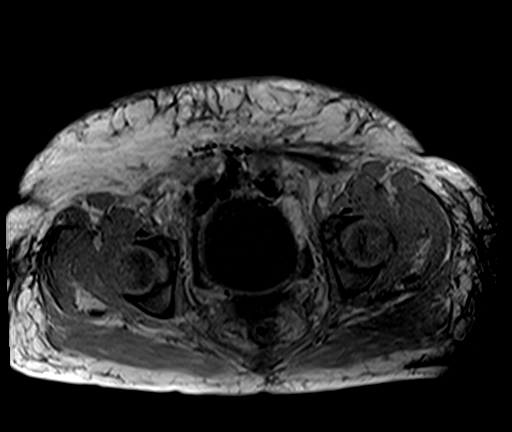
[im 71/176]
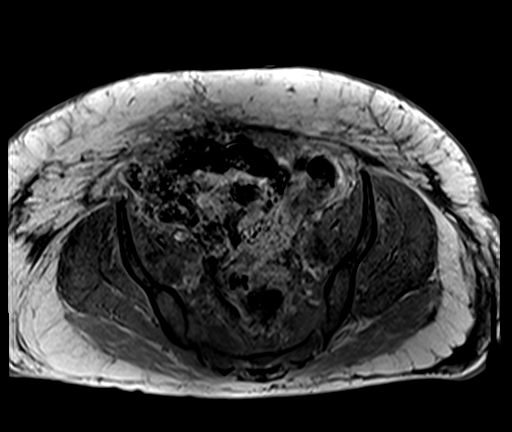
[im 106/176]
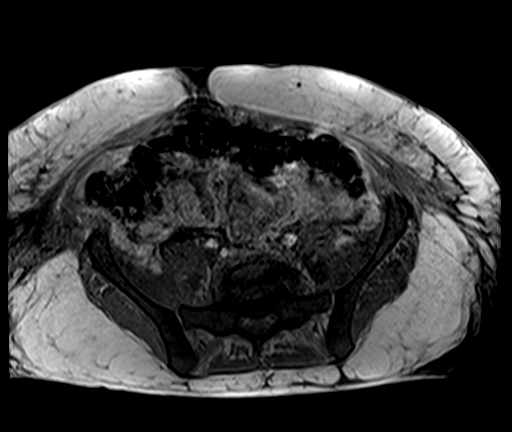
[im 141/176]
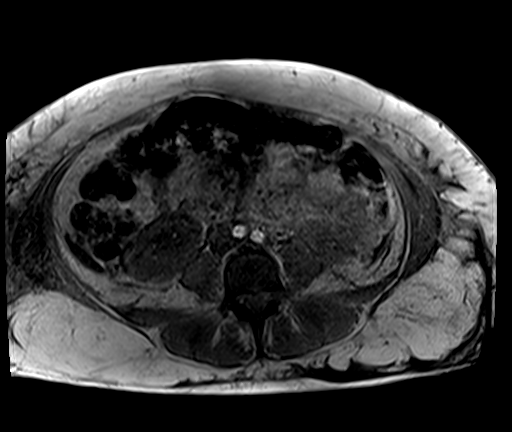
[im 176/176]
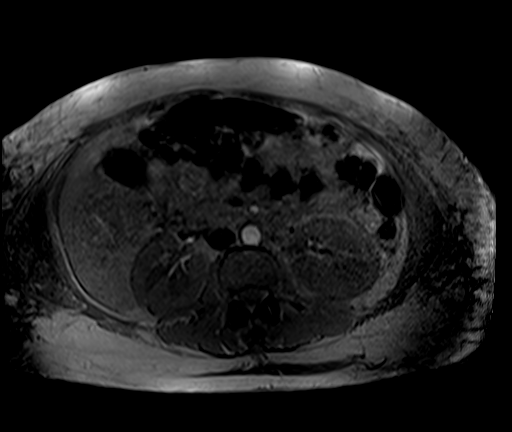

[Series 13: T1 dynamic fat-sat · axial · 1.4mm · 0.78mm/px · z∈[-109,+136]mm · 6 of 176 slices shown (2 of 2)]
[im 1/176]
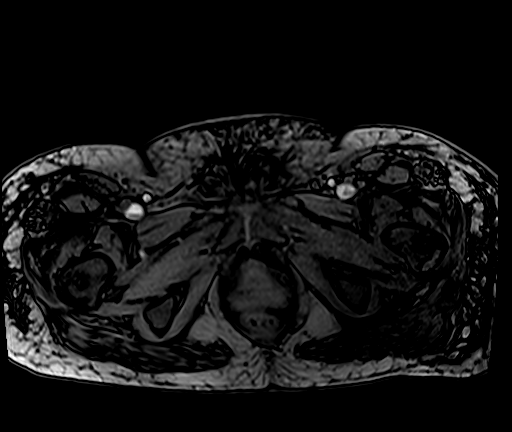
[im 36/176]
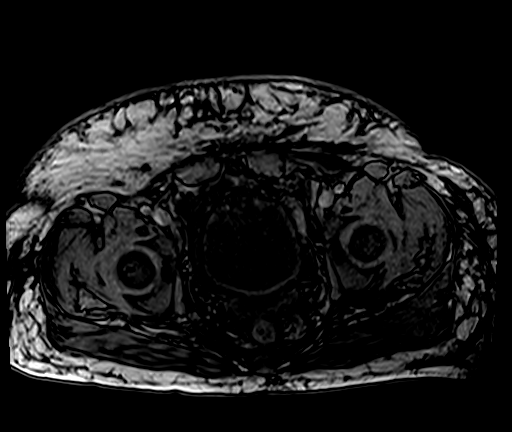
[im 71/176]
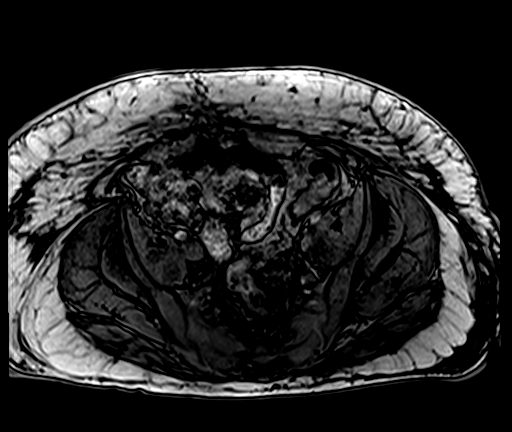
[im 106/176]
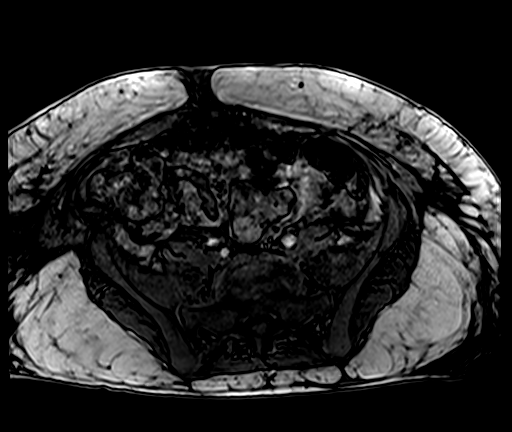
[im 141/176]
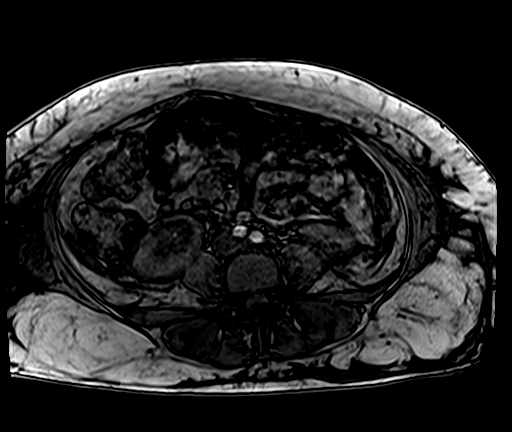
[im 176/176]
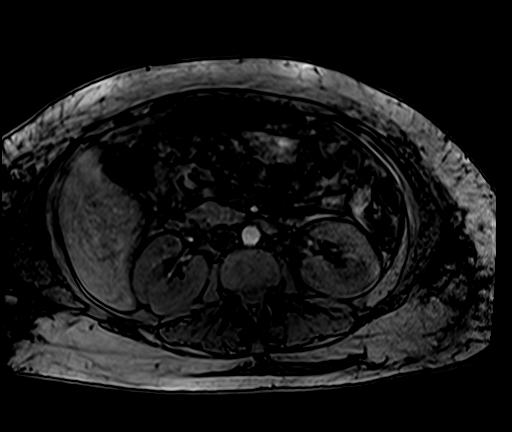

[Series 14: sag tse trig · sagittal · 5.0mm · 0.81mm/px · 1 of 39 slices shown]
[im 1/39]
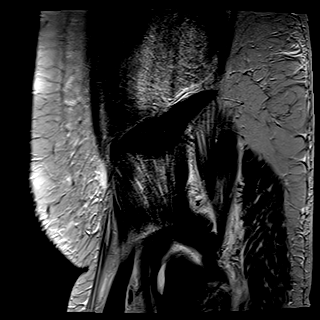

[Series 15: T1 dynamic · axial · 3.9mm · 0.55mm/px · z∈[-121,+156]mm · 3 of 72 slices shown (1 of 2)]
[im 1/72]
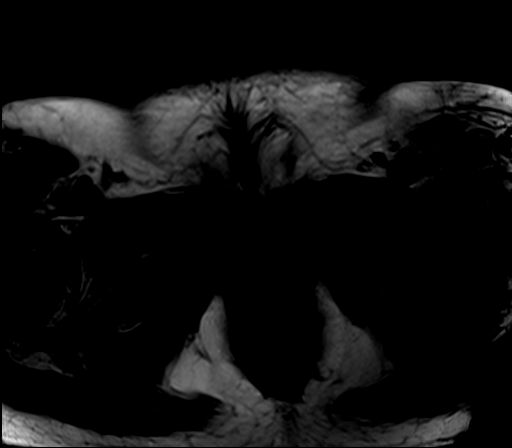
[im 36/72]
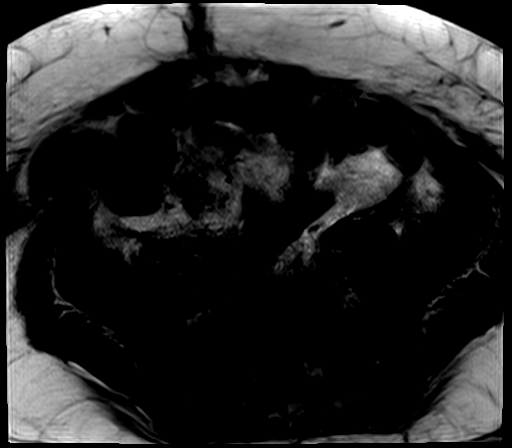
[im 72/72]
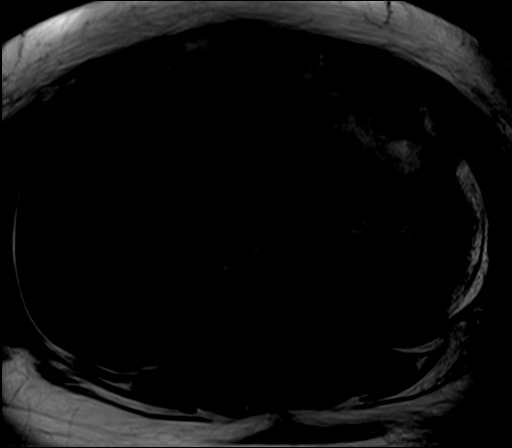

[Series 16: T1 dynamic · axial · 3.9mm · 0.55mm/px · z∈[-121,+156]mm · 3 of 72 slices shown (2 of 2)]
[im 1/72]
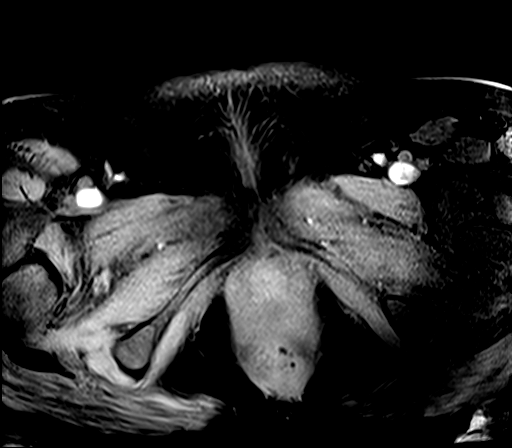
[im 36/72]
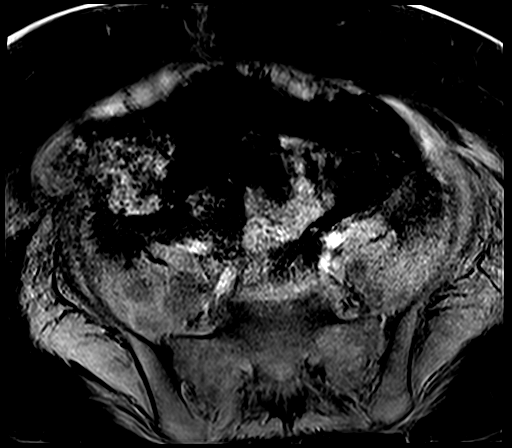
[im 72/72]
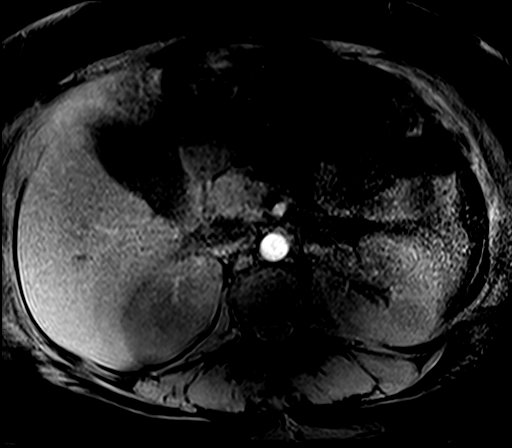

[23 of 48 positions shown; findings below may reference images not displayed]

FINDINGS: Urinary Tract:  No abnormality visualized.

Bowel:  Unremarkable visualized pelvic bowel loops.

Vascular/Lymphatic: No pathologically enlarged lymph nodes. No
significant vascular abnormality seen.

Reproductive: Either prior uterine surgery or presence of a uterine
anomaly.

Other:  Moderate to large volume pelvic free fluid.

Musculoskeletal: There is discitis osteomyelitis at L5-S1 as
described on recent lumbar spine MRI. There are bilateral sacroiliac
joint effusions, with periarticular marrow edema signal, right worse
than left, capsular thickening and adjacent soft tissue edema and
abscesses consistent with infectious sacroiliitis. There is
increased edema signal throughout the sacrum with enhancement and a
focal area of non enhancement along the right sacrum at the level of
S2 measuring 1.5 x 2.1 cm (series 18, image 40) consistent with
sacral osteomyelitis.

There is diffusely low T1 marrow signal with areas of reconversion
in the femoral heads and greater trochanters, consistent with
hematologic derangement (per medical record patient is anemic).

There are bilateral psoas intramuscular abscesses, measuring 4.5 x
4.6 x 7.9 cm on the right (series 19, image 38, series 14, image 12)
and 4.4 x 3.5 x 7.6 cm on the left (series 19 image 42 and series 14
image 29). These abscesses extend from the level of L5-S1 on the
right and L4 on the left and with distal margin at the inferior
aspect of the sacroiliac joints. Edema and portions of the abscesses
extend extends along into the iliacus the iliacus muscles.

There are paraspinal muscle abscesses adjacent to the sacroiliac
joints and L5-S1 facets joints, for example a paraspinal abscess on
the right measuring 1.3 x 1.9 cm (series 19, image 27), and a
paraspinal muscle abscess on the left measuring 1.8 x 0.8 cm (series
19, image 25).

There is a left an intramuscular abscess in the left piriformis
muscle measuring 2.5 x 0.8 cm (series 19, image 52).

There is a small intramuscular abscess within the left gluteus
medius muscle measuring 1.0 x 0.8 cm (series 19, image 48).

There are small bilateral hip joint effusions, left greater than
right. Thin synovial enhancement on the left. There is extensive
intramuscular edema within the pelvic and hip musculature.

Extensive body wall edema.
IMPRESSION: Discitis osteomyelitis at L5-S1, with bilateral infectious
sacroiliitis and osteomyelitis of the sacrum and periarticular right
iliac bone. Bilateral psoas abscesses extending from the lower
lumbar spine to the inferior aspect of the sacroiliac joints
measuring 4.5 x 4.6 x 7.9 cm on the right and 4.4 x 3.5 x 7.6 cm on
the left.

Small bilateral paraspinal muscle abscesses adjacent to the SI
joints and L5-S1 facet joints, measuring up to 1.3 x 1.9 cm on the
right and 1.8 x 0.8 cm on the left. Edema and portions of these
abscesses extend into the iliacus muscles.

Intramuscular abscess within the left piriformis muscle measuring
2.5 x 0.8 cm.

Small intramuscular abscess within the left gluteus medius muscle
measuring 1.0 x 0.8 cm.

Small bilateral hip joint effusions, left greater than right, with
thin synovial enhancement on the left, favored to be reactive the
given widespread infectious process, infection of the left hip
cannot be completely excluded.

Extensive intramuscular edema within the pelvic and hip musculature
and body wall edema.

Moderate to large volume pelvic free fluid.

## 2023-04-03 IMAGING — MR MR THORACIC SPINE W/O CM
6 series · 42 of 48 positions shown · non-contrast
Comparison: None.

CLINICAL DATA: Osteomyelitis.  Substance abuse

EXAM:
MRI CERVICAL, THORACIC AND LUMBAR SPINE WITHOUT CONTRAST
TECHNIQUE: Multiplanar and multiecho pulse sequences of the cervical spine, to
include the craniocervical junction and cervicothoracic junction,
and thoracic and lumbar spine, were obtained without intravenous
contrast.

[Series 37: T1 · sagittal · 4.0mm · 1.72mm/px · 3 of 5 slices shown (1 of 2)]
[im 1/5]
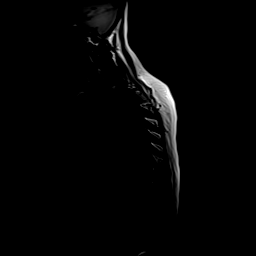
[im 3/5]
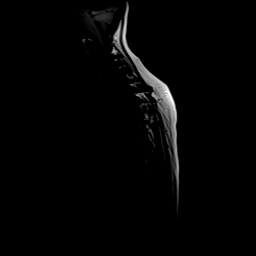
[im 5/5]
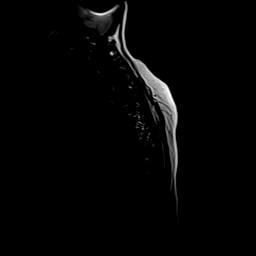

[Series 38: STIR · sagittal · 3.0mm · 1.03mm/px · 5 of 15 slices shown]
[im 1/15]
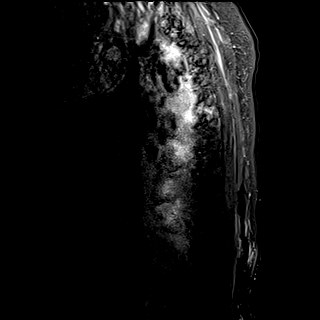
[im 4/15]
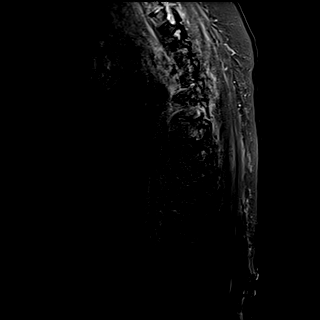
[im 8/15]
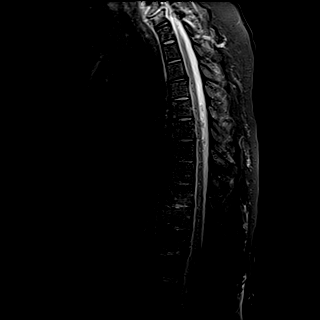
[im 11/15]
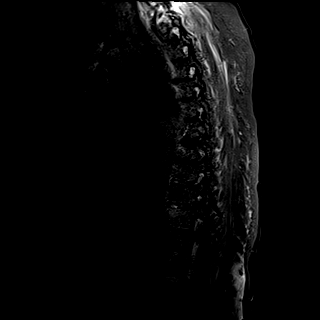
[im 15/15]
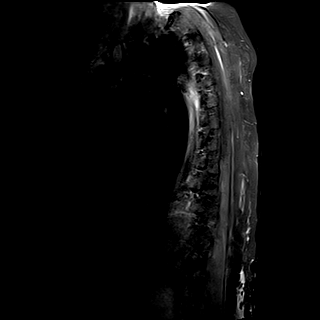

[Series 39: T1 · sagittal · 3.0mm · 1.03mm/px · 5 of 15 slices shown (2 of 2)]
[im 1/15]
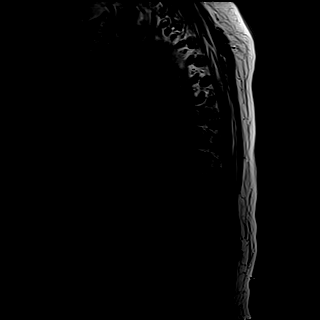
[im 4/15]
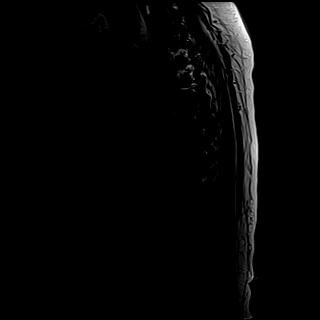
[im 8/15]
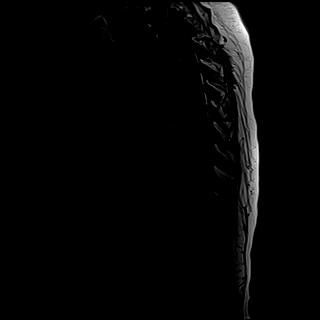
[im 11/15]
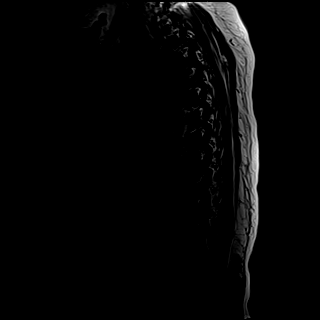
[im 15/15]
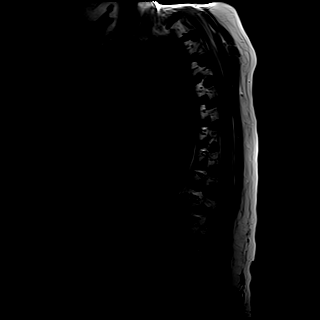

[Series 40: T2 · sagittal · 3.0mm · 0.83mm/px · 5 of 15 slices shown (1 of 2)]
[im 1/15]
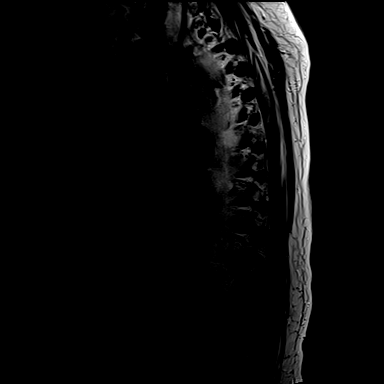
[im 4/15]
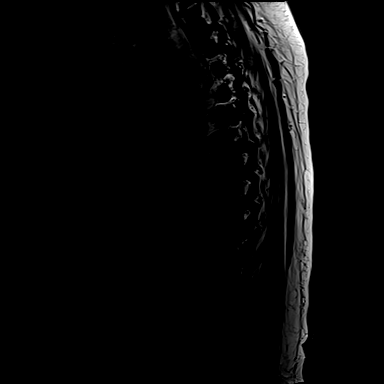
[im 8/15]
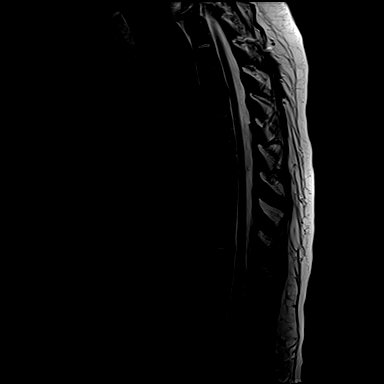
[im 11/15]
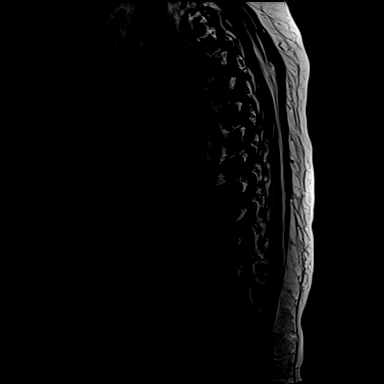
[im 15/15]
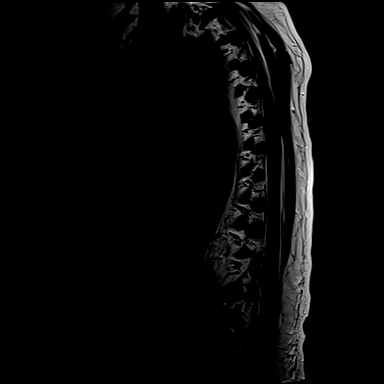

[Series 41: T2 · axial · 4.0mm · 0.78mm/px · z∈[-308,-50]mm · 15 of 44 slices shown (2 of 2)]
[im 1/44]
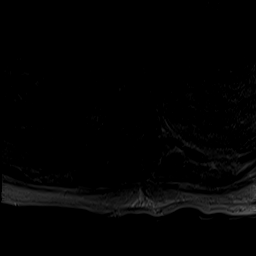
[im 4/44]
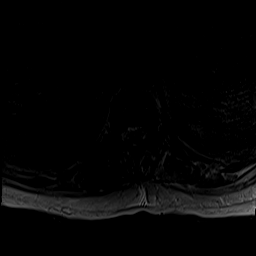
[im 7/44]
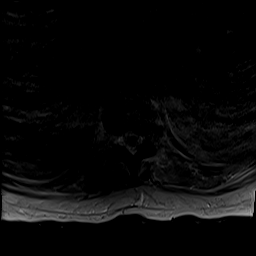
[im 10/44]
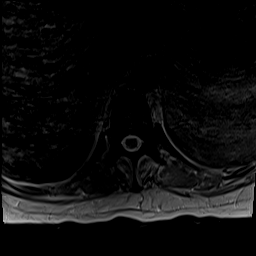
[im 13/44]
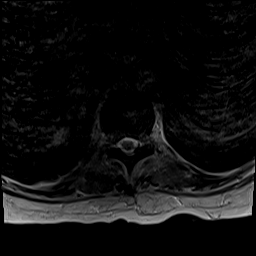
[im 16/44]
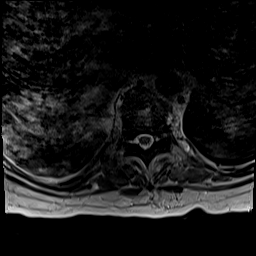
[im 19/44]
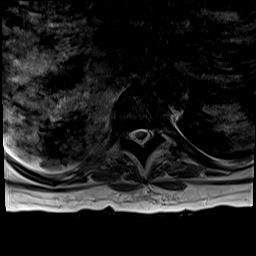
[im 22/44]
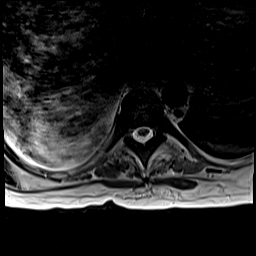
[im 25/44]
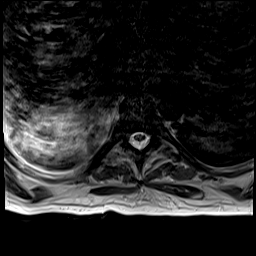
[im 28/44]
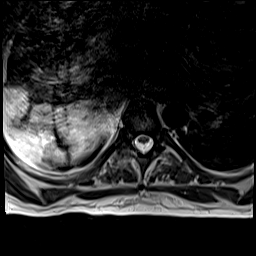
[im 31/44]
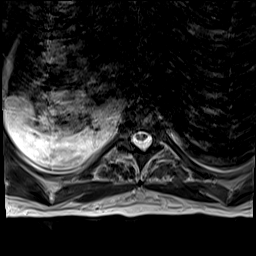
[im 34/44]
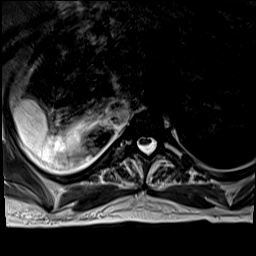
[im 37/44]
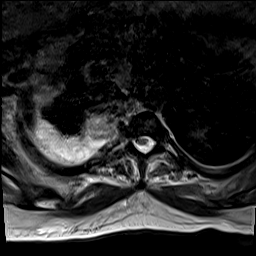
[im 40/44]
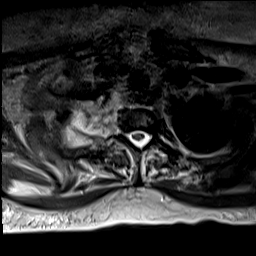
[im 44/44]
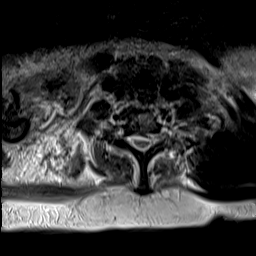

[Series 42: t2_me2d_tra · axial · 4.0mm · 0.52mm/px · z∈[-308,-50]mm · 9 of 44 slices shown]
[im 1/44]
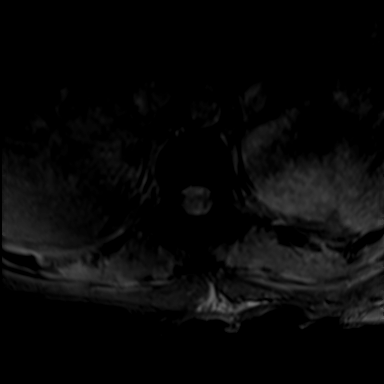
[im 4/44]
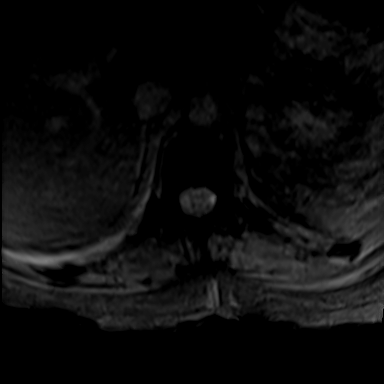
[im 7/44]
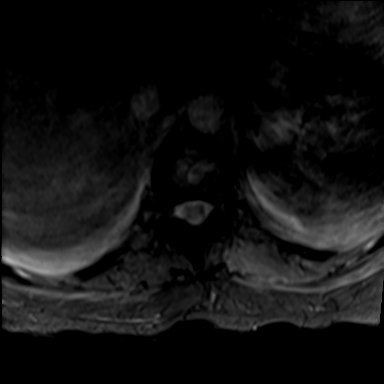
[im 13/44]
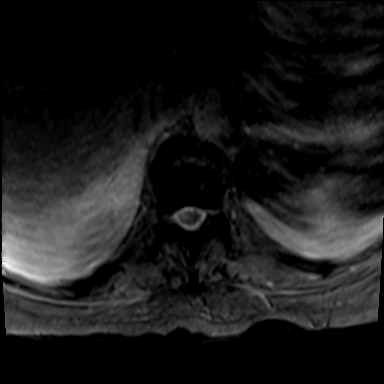
[im 19/44]
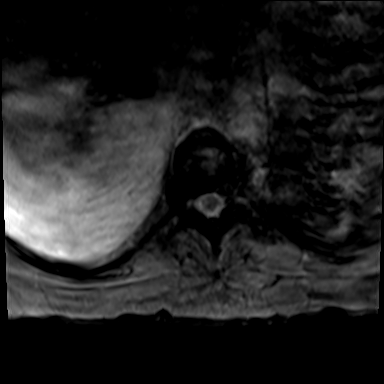
[im 25/44]
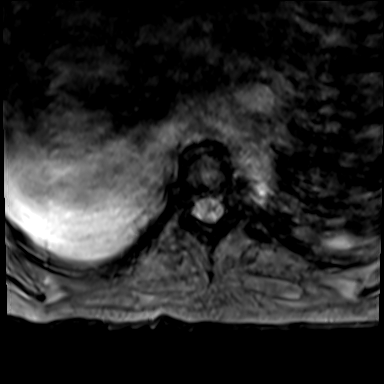
[im 31/44]
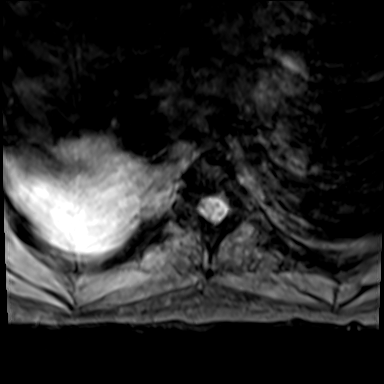
[im 37/44]
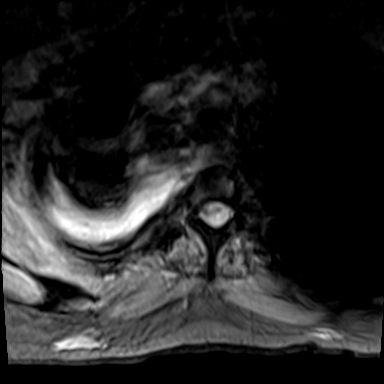
[im 44/44]
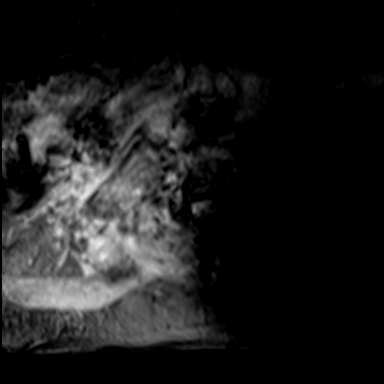

[42 of 48 positions shown; findings below may reference images not displayed]

FINDINGS: MRI CERVICAL SPINE FINDINGS

Alignment: Straightening of cervical lordosis.

Vertebrae: Disc T2 hyperintensity at C2-3 with right paracentral
herniation/decompression flattening the right cord. The right C3-4
facet shows a joint effusion with posterior bulging of the capsule,
although no marrow edema at this time.

Disc and marrow T2 hyperintensity at C5-6 and C6-7 with
decompression into the ventral epidural space where there is a
epidural collection measuring up to 4 mm in thickness with cord
compression. Left C7-T1 facet arthritis including small joint
effusion.

Cord: Cord compression described above.  No cord edema.

Posterior Fossa, vertebral arteries, paraspinal tissues:
Inflammatory changes at the right shoulder which is described
separately.

Disc levels:

No contributory degenerative changes.

MRI THORACIC SPINE FINDINGS

Alignment:  Negative for listhesis.

Vertebrae: Mild disc space and marrow T2 hyperintensity at T9-10 and
T11-12. Right facet marrow edema and periarticular inflammation at
T5-6.

Cord: Normal signal and morphology. No visible epidural collection
in the thoracic spine.

Paraspinal and other soft tissues: Right paravertebral inflammation
at T5-6 is noted above. No detected collection. Right apical and
posterior pleural effusion that shows internal complexity likely
from septations.

Disc levels:

No contributory degenerative changes

MRI LUMBAR SPINE FINDINGS

Segmentation: 5 lumbar type vertebrae based on the available
coverage

Alignment:  Normal

Vertebrae: Marrow edema diffusely in the covered sacrum and in the
L5 vertebra with L5-S1 disc space T2 hyperintensity and endplate
erosion. Partially covered bilateral sacroiliac joint effusion with
bilateral iliopsoas abscess. Dorsal epidural abscess at the level of
L4 and L5, highly compressive of the thecal sac and measuring up to
9 mm in thickness. Bilateral facet arthritis at L5-S1.

Conus medullaris and cauda equina: Conus extends to the L1-2 level.
Conus appears normal. Poorly evaluated cauda equina due to
compression.

Disc levels:

No contributory degenerative changes.

Findings communicated in [REDACTED] chat.
IMPRESSION: Cervical spine:

1. Discitis/osteomyelitis at C2-3, C5-6, and C6-7. Ventral epidural
abscess at C2-3, C5, and C6 with cord impingement especially at the
lower 2 levels.
2. Facet arthritis on the left at C7-T1 and right at C3-4.

Thoracic spine:

1. Early discitis/osteomyelitis at T9-10 and T11-12. Right facet
arthritis at T5-6.
2. Complex right pleural effusion, suspect empyema in this setting.
Recommend chest CT with contrast.

Lumbar spine:

1. L5-S1 discitis/osteomyelitis and probable bilateral sacroiliac
septic arthritis. Bilateral facet arthritis at L5-S1.
2. Bilateral iliopsoas abscess, incompletely covered, recommend
pelvis MRI with contrast.
3. Dorsal epidural abscess at L4 and L5, highly compressive on the
thecal sac.
